# Patient Record
Sex: Female | Born: 1960 | Race: Black or African American | Hispanic: No | Marital: Single | Smoking: Never smoker
Health system: Southern US, Community
[De-identification: ages and names within clinical notes are randomized; demographics above are authoritative.]

## PROBLEM LIST (undated history)

## (undated) DIAGNOSIS — I10 Essential (primary) hypertension: Secondary | ICD-10-CM

## (undated) DIAGNOSIS — Z8041 Family history of malignant neoplasm of ovary: Secondary | ICD-10-CM

## (undated) DIAGNOSIS — F419 Anxiety disorder, unspecified: Secondary | ICD-10-CM

## (undated) DIAGNOSIS — I517 Cardiomegaly: Secondary | ICD-10-CM

## (undated) DIAGNOSIS — Z803 Family history of malignant neoplasm of breast: Secondary | ICD-10-CM

## (undated) DIAGNOSIS — E079 Disorder of thyroid, unspecified: Secondary | ICD-10-CM

## (undated) DIAGNOSIS — R42 Dizziness and giddiness: Secondary | ICD-10-CM

## (undated) DIAGNOSIS — D649 Anemia, unspecified: Secondary | ICD-10-CM

## (undated) DIAGNOSIS — G4730 Sleep apnea, unspecified: Secondary | ICD-10-CM

## (undated) DIAGNOSIS — I272 Pulmonary hypertension, unspecified: Secondary | ICD-10-CM

## (undated) DIAGNOSIS — M1990 Unspecified osteoarthritis, unspecified site: Secondary | ICD-10-CM

## (undated) DIAGNOSIS — E0590 Thyrotoxicosis, unspecified without thyrotoxic crisis or storm: Secondary | ICD-10-CM

## (undated) HISTORY — DX: Family history of malignant neoplasm of breast: Z80.3

## (undated) HISTORY — DX: Anxiety disorder, unspecified: F41.9

## (undated) HISTORY — DX: Pulmonary hypertension, unspecified: I27.20

## (undated) HISTORY — DX: Dizziness and giddiness: R42

## (undated) HISTORY — DX: Family history of malignant neoplasm of ovary: Z80.41

## (undated) HISTORY — DX: Sleep apnea, unspecified: G47.30

## (undated) HISTORY — DX: Disorder of thyroid, unspecified: E07.9

## (undated) HISTORY — PX: TUBAL LIGATION: SHX77

## (undated) HISTORY — DX: Unspecified osteoarthritis, unspecified site: M19.90

---

## 1999-10-29 ENCOUNTER — Encounter: Payer: Self-pay | Admitting: Obstetrics and Gynecology

## 1999-11-07 ENCOUNTER — Encounter: Payer: Self-pay | Admitting: Obstetrics and Gynecology

## 1999-11-07 ENCOUNTER — Ambulatory Visit (HOSPITAL_COMMUNITY): Admission: RE | Admit: 1999-11-07 | Discharge: 1999-11-07 | Payer: Self-pay | Admitting: Obstetrics and Gynecology

## 1999-12-10 ENCOUNTER — Encounter (INDEPENDENT_AMBULATORY_CARE_PROVIDER_SITE_OTHER): Payer: Self-pay | Admitting: Specialist

## 1999-12-10 ENCOUNTER — Other Ambulatory Visit: Admission: RE | Admit: 1999-12-10 | Discharge: 1999-12-10 | Payer: Self-pay | Admitting: Obstetrics and Gynecology

## 1999-12-24 ENCOUNTER — Observation Stay (HOSPITAL_COMMUNITY): Admission: RE | Admit: 1999-12-24 | Discharge: 1999-12-25 | Payer: Self-pay | Admitting: Obstetrics and Gynecology

## 1999-12-24 ENCOUNTER — Encounter: Payer: Self-pay | Admitting: Obstetrics and Gynecology

## 2000-10-06 ENCOUNTER — Other Ambulatory Visit: Admission: RE | Admit: 2000-10-06 | Discharge: 2000-10-06 | Payer: Self-pay | Admitting: Obstetrics and Gynecology

## 2000-10-11 ENCOUNTER — Ambulatory Visit (HOSPITAL_COMMUNITY): Admission: RE | Admit: 2000-10-11 | Discharge: 2000-10-11 | Payer: Self-pay | Admitting: Obstetrics and Gynecology

## 2000-10-11 ENCOUNTER — Encounter: Payer: Self-pay | Admitting: Obstetrics and Gynecology

## 2006-03-31 ENCOUNTER — Ambulatory Visit: Payer: Self-pay

## 2007-04-06 ENCOUNTER — Ambulatory Visit: Payer: Self-pay | Admitting: Internal Medicine

## 2007-04-28 ENCOUNTER — Ambulatory Visit: Payer: Self-pay | Admitting: Internal Medicine

## 2007-07-31 ENCOUNTER — Emergency Department (HOSPITAL_COMMUNITY): Admission: EM | Admit: 2007-07-31 | Discharge: 2007-07-31 | Payer: Self-pay | Admitting: Emergency Medicine

## 2007-12-09 ENCOUNTER — Ambulatory Visit: Payer: Self-pay | Admitting: Gastroenterology

## 2008-07-07 ENCOUNTER — Ambulatory Visit: Payer: Self-pay | Admitting: Internal Medicine

## 2008-07-16 ENCOUNTER — Ambulatory Visit: Payer: Self-pay | Admitting: Internal Medicine

## 2008-08-07 ENCOUNTER — Ambulatory Visit: Payer: Self-pay | Admitting: Internal Medicine

## 2008-09-06 ENCOUNTER — Ambulatory Visit: Payer: Self-pay | Admitting: Internal Medicine

## 2009-07-07 ENCOUNTER — Ambulatory Visit: Payer: Self-pay | Admitting: Internal Medicine

## 2009-08-02 ENCOUNTER — Ambulatory Visit: Payer: Self-pay | Admitting: Internal Medicine

## 2009-08-07 ENCOUNTER — Ambulatory Visit: Payer: Self-pay | Admitting: Internal Medicine

## 2009-12-06 ENCOUNTER — Ambulatory Visit: Payer: Self-pay | Admitting: Internal Medicine

## 2009-12-07 ENCOUNTER — Ambulatory Visit: Payer: Self-pay | Admitting: Internal Medicine

## 2010-01-08 ENCOUNTER — Ambulatory Visit: Payer: Self-pay | Admitting: Internal Medicine

## 2010-12-03 LAB — URINALYSIS, ROUTINE W REFLEX MICROSCOPIC
Nitrite: NEGATIVE
Protein, ur: NEGATIVE
Specific Gravity, Urine: 1.025
Urobilinogen, UA: 0.2

## 2011-02-02 ENCOUNTER — Ambulatory Visit: Payer: Self-pay

## 2012-04-29 ENCOUNTER — Emergency Department: Payer: Self-pay | Admitting: Emergency Medicine

## 2012-04-29 LAB — CBC
HGB: 13.4 g/dL (ref 12.0–16.0)
MCH: 29 pg (ref 26.0–34.0)
MCHC: 32.7 g/dL (ref 32.0–36.0)
RDW: 14.2 % (ref 11.5–14.5)
WBC: 11.2 10*3/uL — ABNORMAL HIGH (ref 3.6–11.0)

## 2012-04-29 LAB — CK TOTAL AND CKMB (NOT AT ARMC): CK, Total: 113 U/L (ref 21–215)

## 2012-04-29 LAB — BASIC METABOLIC PANEL
BUN: 11 mg/dL (ref 7–18)
Co2: 30 mmol/L (ref 21–32)
EGFR (Non-African Amer.): >60
Glucose: 94 mg/dL (ref 65–99)
Osmolality: 280 (ref 275–301)

## 2012-04-30 ENCOUNTER — Emergency Department (HOSPITAL_COMMUNITY): Payer: Managed Care, Other (non HMO)

## 2012-04-30 ENCOUNTER — Emergency Department (HOSPITAL_COMMUNITY)
Admission: EM | Admit: 2012-04-30 | Discharge: 2012-04-30 | Disposition: A | Discharge status: Home / Self Care | Payer: Managed Care, Other (non HMO) | Attending: Emergency Medicine | Admitting: Emergency Medicine

## 2012-04-30 ENCOUNTER — Encounter (HOSPITAL_COMMUNITY): Payer: Self-pay

## 2012-04-30 DIAGNOSIS — Z79899 Other long term (current) drug therapy: Secondary | ICD-10-CM | POA: Insufficient documentation

## 2012-04-30 DIAGNOSIS — R071 Chest pain on breathing: Secondary | ICD-10-CM | POA: Insufficient documentation

## 2012-04-30 DIAGNOSIS — R0789 Other chest pain: Secondary | ICD-10-CM

## 2012-04-30 DIAGNOSIS — D649 Anemia, unspecified: Secondary | ICD-10-CM | POA: Insufficient documentation

## 2012-04-30 DIAGNOSIS — I10 Essential (primary) hypertension: Secondary | ICD-10-CM | POA: Insufficient documentation

## 2012-04-30 HISTORY — DX: Anemia, unspecified: D64.9

## 2012-04-30 HISTORY — DX: Essential (primary) hypertension: I10

## 2012-04-30 LAB — CBC WITH DIFFERENTIAL/PLATELET
Eosinophils Absolute: 0.2 10*3/uL (ref 0.0–0.7)
Eosinophils Relative: 3 % (ref 0–5)
Hemoglobin: 13.6 g/dL (ref 12.0–15.0)
Lymphs Abs: 2.8 10*3/uL (ref 0.7–4.0)
MCH: 28.6 pg (ref 26.0–34.0)
MCV: 86.3 fL (ref 78.0–100.0)
Monocytes Absolute: 0.6 10*3/uL (ref 0.1–1.0)
Monocytes Relative: 8 % (ref 3–12)
RBC: 4.75 MIL/uL (ref 3.87–5.11)

## 2012-04-30 LAB — BASIC METABOLIC PANEL
BUN: 10 mg/dL (ref 6–23)
Calcium: 8.8 mg/dL (ref 8.4–10.5)
GFR calc non Af Amer: >90 mL/min (ref >90)
Glucose, Bld: 113 mg/dL — ABNORMAL HIGH (ref 70–99)
Potassium: 3.6 mEq/L (ref 3.5–5.1)

## 2012-04-30 MED ORDER — KETOROLAC TROMETHAMINE 30 MG/ML IJ SOLN
30.0000 mg | Freq: Once | INTRAMUSCULAR | Status: AC
  Administered 2012-04-30: 30 mg via INTRAVENOUS
  Filled 2012-04-30: qty 1

## 2012-04-30 MED ORDER — METHOCARBAMOL 500 MG PO TABS: 1000.0000 mg | ORAL_TABLET | Freq: Four times a day (QID) | ORAL | Status: DC | PRN

## 2012-04-30 MED ORDER — NAPROXEN 250 MG PO TABS: 250.0000 mg | ORAL_TABLET | Freq: Two times a day (BID) | ORAL | Status: DC

## 2012-04-30 MED ORDER — HYDROCODONE-ACETAMINOPHEN 5-325 MG PO TABS: ORAL_TABLET | ORAL | Status: DC

## 2012-04-30 NOTE — ED Provider Notes (Signed)
History     CSN: [id number]  Arrival date & time 04/30/12  1056   First MD Initiated Contact with Patient 04/30/12 1110      Chief Complaint  Patient presents with  . Chest Pain     HPI Pt was seen at 1120.   Per pt, c/o gradual onset and persistence of constant left sided chest "pain" for the past 3 days.  Describes the CP as "sharp" and "constant."  Pain worsens with palpation of the area and movement of her torso.  States she was eval at an [facility] yesterday, and sent to [facility] ED for further eval.  States "they took some blood work every 4 hours" but did not see a doctor, so they left AMA after 8 hours.  Denies any change in pain, no SOB/cough, no palpitations, no injury, no abd pain, no N/V/D, no back pain, no fevers, no rash.     Past Medical History  Diagnosis Date  . Hypertension   . Anemia     Past Surgical History  Procedure Laterality Date  . Tubal ligation      History  Substance Use Topics  . Smoking status: Never Smoker   . Smokeless tobacco: Not on file  . Alcohol Use: No    Review of Systems ROS: Statement: All systems negative except as marked or noted in the HPI; Constitutional: Negative for fever and chills. ; ; Eyes: Negative for eye pain, redness and discharge. ; ; ENMT: Negative for ear pain, hoarseness, nasal congestion, sinus pressure and sore throat. ; ; Cardiovascular: +CP. Negative for palpitations, diaphoresis, dyspnea and peripheral edema. ; ; Respiratory: Negative for cough, wheezing and stridor. ; ; Gastrointestinal: Negative for nausea, vomiting, diarrhea, abdominal pain, blood in stool, hematemesis, jaundice and rectal bleeding. . ; ; Genitourinary: Negative for dysuria, flank pain and hematuria. ; ; Musculoskeletal: Negative for back pain and neck pain. Negative for swelling and trauma.; ; Skin: Negative for pruritus, rash, abrasions, blisters, bruising and skin lesion.; ; Neuro: Negative for headache, lightheadedness and neck stiffness. Negative  for weakness, altered level of consciousness , altered mental status, extremity weakness, paresthesias, involuntary movement, seizure and syncope.       Allergies  Review of patient's allergies indicates no known allergies.  Home Medications   Current Outpatient Rx  Name  Route  Sig  Dispense  Refill  . acetaminophen (TYLENOL) 500 MG tablet   Oral   Take 1,000 mg by mouth every 6 (six) hours as needed for pain.         . ferrous sulfate 325 (65 FE) MG tablet   Oral   Take 325 mg by mouth daily.         Marland Kitchen ibuprofen (ADVIL,MOTRIN) 200 MG tablet   Oral   Take 400 mg by mouth every 6 (six) hours as needed for pain.         Marland Kitchen levonorgestrel (MIRENA) 20 MCG/24HR IUD   Intrauterine   1 each by Intrauterine route once.           BP 141/81  Pulse 63  Temp(Src) 98.2 F (36.8 C) (Oral)  Resp 19  Ht 5\' 7"  (1.702 m)  Wt 268 lb (121.564 kg)  BMI 41.96 kg/m2  SpO2 98%  LMP 04/30/2012  Physical Exam 1125: Physical examination:  Nursing notes reviewed; Vital signs and O2 SAT reviewed;  Constitutional: Well developed, Well nourished, Well hydrated, In no acute distress; Head:  Normocephalic, atraumatic; Eyes: EOMI, PERRL, No scleral icterus;  ENMT: Mouth and pharynx normal, Mucous membranes moist; Neck: Supple, Full range of motion, No lymphadenopathy; Cardiovascular: Regular rate and rhythm, No murmur, rub, or gallop; Respiratory: Breath sounds clear & equal bilaterally, No rales, rhonchi, wheezes.  Speaking full sentences with ease, Normal respiratory effort/excursion; Chest: +left parasternal and anterior chest wall tenderness to palp. No soft tissue crepitus, Movement normal; Abdomen: Soft, Nontender, Nondistended, Normal bowel sounds;; Extremities: Pulses normal, No tenderness, No edema, No calf edema or asymmetry.; Neuro: AA&Ox3, Major CN grossly intact.  Speech clear. No gross focal motor or sensory deficits in extremities.; Skin: Color normal, Warm, Dry.   ED Course   Procedures    MDM  MDM Reviewed: nursing note, previous chart and vitals Reviewed previous: labs, ECG and x-ray Interpretation: labs, ECG and x-ray    Date: 04/30/2012  Rate: 64  Rhythm: normal sinus rhythm  QRS Axis: left  Intervals: normal  ST/T Wave abnormalities: normal  Conduction Disutrbances:none  Narrative Interpretation: poor R-wave progression  Old EKG Reviewed: none available    Results for orders placed during the hospital encounter of 04/30/12  CBC WITH DIFFERENTIAL      Result Value Range   WBC 7.9  4.0 - 10.5 K/uL   RBC 4.75  3.87 - 5.11 MIL/uL   Hemoglobin 13.6  12.0 - 15.0 g/dL   HCT 21.3  08.6 - 57.8 %   MCV 86.3  78.0 - 100.0 fL   MCH 28.6  26.0 - 34.0 pg   MCHC 33.2  30.0 - 36.0 g/dL   RDW 46.9  62.9 - 52.8 %   Platelets 344  150 - 400 K/uL   Neutrophils Relative 53  43 - 77 %   Neutro Abs 4.2  1.7 - 7.7 K/uL   Lymphocytes Relative 36  12 - 46 %   Lymphs Abs 2.8  0.7 - 4.0 K/uL   Monocytes Relative 8  3 - 12 %   Monocytes Absolute 0.6  0.1 - 1.0 K/uL   Eosinophils Relative 3  0 - 5 %   Eosinophils Absolute 0.2  0.0 - 0.7 K/uL   Basophils Relative 1  0 - 1 %   Basophils Absolute 0.1  0.0 - 0.1 K/uL  BASIC METABOLIC PANEL      Result Value Range   Sodium 139  135 - 145 mEq/L   Potassium 3.6  3.5 - 5.1 mEq/L   Chloride 106  96 - 112 mEq/L   CO2 25  19 - 32 mEq/L   Glucose, Bld 113 (*) 70 - 99 mg/dL   BUN 10  6 - 23 mg/dL   Creatinine, Ser 4.13  0.50 - 1.10 mg/dL   Calcium 8.8  8.4 - 24.4 mg/dL   GFR calc non Af Amer >90  >90 mL/min   GFR calc Af Amer >90  >90 mL/min  TROPONIN I      Result Value Range   Troponin I <0.30  <0.30 ng/mL   Dg Chest 2 View 04/30/2012  *RADIOLOGY REPORT*  Clinical Data: Chest pain  CHEST - 2 VIEW  Comparison: None.  Findings: Borderline cardiomegaly.  No acute infiltrate or pleural effusion.  No pulmonary edema.  Mild left basilar atelectasis.  IMPRESSION: No acute infiltrate or pulmonary edema.  Borderline  cardiomegaly. Mild left basilar atelectasis.   Original Report Authenticated By: Natasha Mead, M.D.      1420:  Pt states she feels better now and wants to go home.  Has tol PO well while in  the ED.  [place] ED records received:  Pt LWBS after 2 normal troponin levels 4 hours apart and EKG without acute STTW changes.  Today's EKG without acute STTW changes and troponin without elevation.  Doubt ACS with 3 days of constant CP, normal troponins x3 and EKG's x2 without acute STTW changes over the past 2 days. Doubt PE with low risk [place]. Will tx symptomatically at this time. Dx and testing d/w pt and family.  Questions answered.  Verb understanding, agreeable to d/c home with outpt f/u.        Laray Anger, DO 05/02/12 [facility] [place]

## 2012-04-30 NOTE — ED Notes (Signed)
Patient with no complaints at this time. Respirations even and unlabored. Skin warm/dry. Discharge instructions reviewed with patient at this time. Patient given opportunity to voice concerns/ask questions. IV removed per policy and band-aid applied to site. Patient discharged at this time and left Emergency Department with steady gait.  

## 2012-04-30 NOTE — ED Notes (Signed)
Pt c/o left sided chest pain since Wedesday.  Says when she moves a certain way the pain radiates into left shoulder blade.  Denies any SOB.  Pt went to Urgent care yesterday and had chest x ray.  PT went to [place] from Urgent care.  Pt said had some blood work done but never saw a doctor.  Reports waited in the waiting room for 8 hours.

## 2012-08-11 ENCOUNTER — Emergency Department: Payer: Self-pay | Admitting: Emergency Medicine

## 2013-07-18 ENCOUNTER — Emergency Department: Payer: Self-pay | Admitting: Emergency Medicine

## 2013-10-11 ENCOUNTER — Ambulatory Visit: Payer: Self-pay

## 2013-10-15 ENCOUNTER — Emergency Department (HOSPITAL_COMMUNITY)
Admission: EM | Admit: 2013-10-15 | Discharge: 2013-10-15 | Disposition: A | Discharge status: Home / Self Care | Payer: 59 | Attending: Emergency Medicine | Admitting: Emergency Medicine

## 2013-10-15 ENCOUNTER — Emergency Department (HOSPITAL_COMMUNITY): Payer: 59

## 2013-10-15 ENCOUNTER — Encounter (HOSPITAL_COMMUNITY): Payer: Self-pay | Admitting: Emergency Medicine

## 2013-10-15 DIAGNOSIS — Z79899 Other long term (current) drug therapy: Secondary | ICD-10-CM | POA: Diagnosis not present

## 2013-10-15 DIAGNOSIS — I10 Essential (primary) hypertension: Secondary | ICD-10-CM | POA: Insufficient documentation

## 2013-10-15 DIAGNOSIS — E669 Obesity, unspecified: Secondary | ICD-10-CM | POA: Diagnosis not present

## 2013-10-15 DIAGNOSIS — M25562 Pain in left knee: Secondary | ICD-10-CM

## 2013-10-15 DIAGNOSIS — Z791 Long term (current) use of non-steroidal anti-inflammatories (NSAID): Secondary | ICD-10-CM | POA: Insufficient documentation

## 2013-10-15 DIAGNOSIS — Z862 Personal history of diseases of the blood and blood-forming organs and certain disorders involving the immune mechanism: Secondary | ICD-10-CM | POA: Diagnosis not present

## 2013-10-15 DIAGNOSIS — M25569 Pain in unspecified knee: Secondary | ICD-10-CM | POA: Diagnosis not present

## 2013-10-15 MED ORDER — HYDROCODONE-ACETAMINOPHEN 5-325 MG PO TABS
1.0000 | ORAL_TABLET | Freq: Once | ORAL | Status: AC
  Administered 2013-10-15: 1 via ORAL
  Filled 2013-10-15 (×2): qty 1

## 2013-10-15 MED ORDER — NAPROXEN 250 MG PO TABS
500.0000 mg | ORAL_TABLET | Freq: Once | ORAL | Status: AC
  Administered 2013-10-15: 500 mg via ORAL
  Filled 2013-10-15: qty 2

## 2013-10-15 MED ORDER — HYDROCODONE-ACETAMINOPHEN 5-325 MG PO TABS: ORAL_TABLET | ORAL | Status: DC

## 2013-10-15 MED ORDER — MELOXICAM 15 MG PO TABS: 15.0000 mg | ORAL_TABLET | Freq: Every day | ORAL | Status: DC

## 2013-10-15 NOTE — ED Notes (Signed)
PT c/o left knee pain x1 week with no injury. PT ambulatory in triage and has full ROM to left knee.

## 2013-10-15 NOTE — Discharge Instructions (Signed)
Arthritis, Nonspecific °Arthritis is pain, redness, warmth, or puffiness (inflammation) of a joint. The joint may be stiff or hurt when you move it. One or more joints may be affected. There are many types of arthritis. Your doctor may not know what type you have right away. The most common cause of arthritis is wear and tear on the joint (osteoarthritis). °HOME CARE  °· Only take medicine as told by your doctor. °· Rest the joint as much as possible. °· Raise (elevate) your joint if it is puffy. °· Use crutches if the painful joint is in your leg. °· Drink enough fluids to keep your pee (urine) clear or pale yellow. °· Follow your doctor's diet instructions. °· Use cold packs for very bad joint pain for 10 to 15 minutes every hour. Ask your doctor if it is okay for you to use hot packs. °· Exercise as told by your doctor. °· Take a warm shower if you have stiffness in the morning. °· Move your sore joints throughout the day. °GET HELP RIGHT AWAY IF:  °· You have a fever. °· You have very bad joint pain, puffiness, or redness. °· You have many joints that are painful and puffy. °· You are not getting better with treatment. °· You have very bad back pain or leg weakness. °· You cannot control when you poop (bowel movement) or pee (urinate). °· You do not feel better in 24 hours or are getting worse. °· You are having side effects from your medicine. °MAKE SURE YOU:  °· Understand these instructions. °· Will watch your condition. °· Will get help right away if you are not doing well or get worse. °Document Released: 05/20/2009 Document Revised: 08/25/2011 Document Reviewed: 05/20/2009 °ExitCare® Patient Information ©2015 [employer]. This information is not intended to replace advice given to you by your health care provider. Make sure you discuss any questions you have with your health care provider. ° °Knee Pain °The knee is the complex joint between your thigh and your lower leg. It is made up of bones, tendons,  ligaments, and cartilage. The bones that make up the knee are: °· The femur in the thigh. °· The tibia and fibula in the lower leg. °· The patella or kneecap riding in the groove on the lower femur. °CAUSES  °Knee pain is a common complaint with many causes. A few of these causes are: °· Injury, such as: °¨ A ruptured ligament or tendon injury. °¨ Torn cartilage. °· Medical conditions, such as: °¨ Gout °¨ Arthritis °¨ Infections °· Overuse, over training, or overdoing a physical activity. °Knee pain can be minor or severe. Knee pain can accompany debilitating injury. Minor knee problems often respond well to self-care measures or get well on their own. More serious injuries may need medical intervention or even surgery. °SYMPTOMS °The knee is complex. Symptoms of knee problems can vary widely. Some of the problems are: °· Pain with movement and weight bearing. °· Swelling and tenderness. °· Buckling of the knee. °· Inability to straighten or extend your knee. °· Your knee locks and you cannot straighten it. °· Warmth and redness with pain and fever. °· Deformity or dislocation of the kneecap. °DIAGNOSIS  °Determining what is wrong may be very straight forward such as when there is an injury. It can also be challenging because of the complexity of the knee. Tests to make a diagnosis may include: °· Your caregiver taking a history and doing a physical exam. °· Routine X-rays   can be used to rule out other problems. X-rays will not reveal a cartilage tear. Some injuries of the knee can be diagnosed by: °¨ Arthroscopy a surgical technique by which a small video camera is inserted through tiny incisions on the sides of the knee. This procedure is used to examine and repair internal knee joint problems. Tiny instruments can be used during arthroscopy to repair the torn knee cartilage (meniscus). °¨ Arthrography is a radiology technique. A contrast liquid is directly injected into the knee joint. Internal structures of the  knee joint then become visible on X-ray film. °¨ An MRI scan is a non X-ray radiology procedure in which magnetic fields and a computer produce two- or three-dimensional images of the inside of the knee. Cartilage tears are often visible using an MRI scanner. MRI scans have largely replaced arthrography in diagnosing cartilage tears of the knee. °· Blood work. °· Examination of the fluid that helps to lubricate the knee joint (synovial fluid). This is done by taking a sample out using a needle and a syringe. °TREATMENT °The treatment of knee problems depends on the cause. Some of these treatments are: °· Depending on the injury, proper casting, splinting, surgery, or physical therapy care will be needed. °· Give yourself adequate recovery time. Do not overuse your joints. If you begin to get sore during workout routines, back off. Slow down or do fewer repetitions. °· For repetitive activities such as cycling or running, maintain your strength and nutrition. °· Alternate muscle groups. For example, if you are a weight lifter, work the upper body on one day and the lower body the next. °· Either tight or weak muscles do not give the proper support for your knee. Tight or weak muscles do not absorb the stress placed on the knee joint. Keep the muscles surrounding the knee strong. °· Take care of mechanical problems. °¨ If you have flat feet, orthotics or special shoes may help. See your caregiver if you need help. °¨ Arch supports, sometimes with wedges on the inner or outer aspect of the heel, can help. These can shift pressure away from the side of the knee most bothered by osteoarthritis. °¨ A brace called an "unloader" brace also may be used to help ease the pressure on the most arthritic side of the knee. °· If your caregiver has prescribed crutches, braces, wraps or ice, use as directed. The acronym for this is PRICE. This means protection, rest, ice, compression, and elevation. °· Nonsteroidal anti-inflammatory  drugs (NSAIDs), can help relieve pain. But if taken immediately after an injury, they may actually increase swelling. Take NSAIDs with food in your stomach. Stop them if you develop stomach problems. Do not take these if you have a history of ulcers, stomach pain, or bleeding from the bowel. Do not take without your caregiver's approval if you have problems with fluid retention, heart failure, or kidney problems. °· For ongoing knee problems, physical therapy may be helpful. °· Glucosamine and chondroitin are over-the-counter dietary supplements. Both may help relieve the pain of osteoarthritis in the knee. These medicines are different from the usual anti-inflammatory drugs. Glucosamine may decrease the rate of cartilage destruction. °· Injections of a corticosteroid drug into your knee joint may help reduce the symptoms of an arthritis flare-up. They may provide pain relief that lasts a few months. You may have to wait a few months between injections. The injections do have a small increased risk of infection, water retention, and elevated blood sugar levels. °·   Hyaluronic acid injected into damaged joints may ease pain and provide lubrication. These injections may work by reducing inflammation. A series of shots may give relief for as long as 6 months. °· Topical painkillers. Applying certain ointments to your skin may help relieve the pain and stiffness of osteoarthritis. Ask your pharmacist for suggestions. Many over the-counter products are approved for temporary relief of arthritis pain. °· In some countries, doctors often prescribe topical NSAIDs for relief of chronic conditions such as arthritis and tendinitis. A review of treatment with NSAID creams found that they worked as well as oral medications but without the serious side effects. °PREVENTION °· Maintain a healthy weight. Extra pounds put more strain on your joints. °· Get strong, stay limber. Weak muscles are a common cause of knee injuries.  Stretching is important. Include flexibility exercises in your workouts. °· Be smart about exercise. If you have osteoarthritis, chronic knee pain or recurring injuries, you may need to change the way you exercise. This does not mean you have to stop being active. If your knees ache after jogging or playing basketball, consider switching to swimming, water aerobics, or other low-impact activities, at least for a few days a week. Sometimes limiting high-impact activities will provide relief. °· Make sure your shoes fit well. Choose footwear that is right for your sport. °· Protect your knees. Use the proper gear for knee-sensitive activities. Use kneepads when playing volleyball or laying carpet. Buckle your seat belt every time you drive. Most shattered kneecaps occur in car accidents. °· Rest when you are tired. °SEEK MEDICAL CARE IF:  °You have knee pain that is continual and does not seem to be getting better.  °SEEK IMMEDIATE MEDICAL CARE IF:  °Your knee joint feels hot to the touch and you have a high fever. °MAKE SURE YOU:  °· Understand these instructions. °· Will watch your condition. °· Will get help right away if you are not doing well or get worse. °Document Released: 12/21/2006 Document Revised: 05/18/2011 Document Reviewed: 12/21/2006 °ExitCare® Patient Information ©2015 [employer]. This information is not intended to replace advice given to you by your health care provider. Make sure you discuss any questions you have with your health care provider. ° °

## 2013-10-15 NOTE — ED Provider Notes (Signed)
CSN: [id number]     Arrival date & time 10/15/13  1748 History  This chart was scribed for non-physician practitioner, Kem Parkinson, PA-C,working with Fredia Sorrow, MD, by [name], ED Scribe. This patient was seen in room APFT22/APFT22 and the patient's care was started at 7:09 PM.  Chief Complaint  Patient presents with  . Knee Pain   Patient is a 53 y.o. female presenting with knee pain. The history is provided by the patient. No language interpreter was used.  Knee Pain Associated symptoms: no back pain, no fatigue, no fever and no neck pain    HPI Comments:  Shelley [name] is a 53 y.o. obese female with h/o HTN who presents to the Emergency Department complaining of worsening, sharp medial left knee pain that started approximately two days ago. She states the pain worsens with extension. Bending lessens the pain. Pt has been taking Tylenol with last dose earlier this morning with no relief. Pt denies any trauma, fall or injury. She denies numbness, redness, swelling or tingling of the LLE ,left hip or ankle pain. She denies any allergies to any medication.  Past Medical History  Diagnosis Date  . Hypertension   . Anemia    Past Surgical History  Procedure Laterality Date  . Tubal ligation     No family history on file. History  Substance Use Topics  . Smoking status: Never Smoker   . Smokeless tobacco: Not on file  . Alcohol Use: No   OB History   Grav Para Term Preterm Abortions TAB SAB Ect Mult Living                 Review of Systems  Constitutional: Negative for fever, chills and fatigue.  HENT: Negative for sore throat and trouble swallowing.   Respiratory: Negative for cough, shortness of breath and wheezing.   Cardiovascular: Negative for chest pain and palpitations.  Gastrointestinal: Negative for nausea, vomiting, abdominal pain and blood in stool.  Genitourinary: Negative for dysuria, hematuria and flank pain.  Musculoskeletal: Positive  for arthralgias. Negative for back pain, myalgias, neck pain and neck stiffness.  Skin: Negative for rash.  Neurological: Negative for dizziness, weakness and numbness.  Hematological: Does not bruise/bleed easily.    Allergies  Review of patient's allergies indicates no known allergies.  Home Medications   Prior to Admission medications   Medication Sig Start Date End Date Taking? Authorizing Provider  acetaminophen (TYLENOL) 500 MG tablet Take 1,000 mg by mouth every 6 (six) hours as needed for pain.    Historical Provider, MD  ferrous sulfate 325 (65 FE) MG tablet Take 325 mg by mouth daily.    Historical Provider, MD  HYDROcodone-acetaminophen (NORCO/VICODIN) 5-325 MG per tablet 1 or 2 tabs PO q6 hours prn pain 04/30/12   Francine Graven, DO  ibuprofen (ADVIL,MOTRIN) 200 MG tablet Take 400 mg by mouth every 6 (six) hours as needed for pain.    Historical Provider, MD  levonorgestrel (MIRENA) 20 MCG/24HR IUD 1 each by Intrauterine route once.    Historical Provider, MD  methocarbamol (ROBAXIN) 500 MG tablet Take 2 tablets (1,000 mg total) by mouth 4 (four) times daily as needed (muscle spasm/pain). 04/30/12   Francine Graven, DO  naproxen (NAPROSYN) 250 MG tablet Take 1 tablet (250 mg total) by mouth 2 (two) times daily with a meal. 04/30/12   Francine Graven, DO   Triage Vitals: BP 128/87  Pulse 68  Temp(Src) 98.2 F (36.8 C) (Oral)  Resp 20  Ht 5\' 6"  (1.676 m)  Wt 269 lb (122.018 kg)  BMI 43.44 kg/m2  SpO2 98% Physical Exam  Nursing note and vitals reviewed. Constitutional: She is oriented to person, place, and time. She appears well-developed and well-nourished.  HENT:  Head: Normocephalic and atraumatic.  Eyes: Conjunctivae are normal.  Neck: Normal range of motion.  Cardiovascular: Normal rate, regular rhythm and normal heart sounds.  Exam reveals no gallop and no friction rub.   No murmur heard. Pulmonary/Chest: Effort normal and breath sounds normal. No respiratory  distress. She has no wheezes. She has no rales.  Musculoskeletal: Normal range of motion. She exhibits tenderness. She exhibits no edema.  Localized tenderness to medial left knee. Mild to moderate patella crepitus. No effusion. Full ROM of knee joint. DP pulse brisk, distal sensation intact.  No calf pain or edema.    Neurological: She is alert and oriented to person, place, and time.  Skin: Skin is warm and dry.  Psychiatric: She has a normal mood and affect. Her behavior is normal.    ED Course  Procedures (including critical care time) DIAGNOSTIC STUDIES: Oxygen Saturation is 98% on RA, normal by my interpretation.   COORDINATION OF CARE: 7:14 PM- Advised pt to alternate heat and ice compress. Advised pt to wear supportive shoes. Will prescribe pain medication and provide brace. Will refer to orthopedics. Pt verbalizes understanding and agrees to plan.  Medications - No data to display  Labs Review Labs Reviewed - No data to display  Imaging Review Dg Knee Complete 4 Views Left  10/15/2013   CLINICAL DATA:  Left knee pain.  No known injury.  EXAM: LEFT KNEE - COMPLETE 4+ VIEW  COMPARISON:  None.  FINDINGS: Mild tricompartmental degenerative changes most notable in the medial compartment. There is joint space narrowing and early osteophytic spurring. There is also peaking of the tibial spines. No acute fracture or osteochondral abnormality. No definite joint effusion or chondrocalcinosis.  IMPRESSION: Degenerative changes but no acute bony findings or joint effusion.   Electronically Signed   By: Kalman Jewels M.D.   On: 10/15/2013 18:43     EKG Interpretation None      MDM   Final diagnoses:  Knee pain, acute, left    Pt well appearing.  clinical suspicion for septic joint is low.  Pt ambulates with steady gait.  Pain improved after application of ACE.  Referral given for Dr. Aline Brochure.    I personally performed the services described in this documentation, which was  scribed in my presence. The recorded information has been reviewed and is accurate.    Kanan Sobek L. Vanessa [place], PA-C 10/16/13 1848

## 2013-10-19 NOTE — ED Provider Notes (Signed)
Medical screening examination/treatment/procedure(s) were performed by non-physician practitioner and as supervising physician I was immediately available for consultation/collaboration.   EKG Interpretation None        Fredia Sorrow, MD 10/19/13 [id number]

## 2013-11-09 ENCOUNTER — Encounter: Payer: Self-pay | Admitting: Orthopedic Surgery

## 2013-11-09 ENCOUNTER — Ambulatory Visit: Payer: 59 | Admitting: Orthopedic Surgery

## 2014-02-04 ENCOUNTER — Encounter (HOSPITAL_COMMUNITY): Payer: Self-pay | Admitting: Emergency Medicine

## 2014-02-04 ENCOUNTER — Emergency Department (HOSPITAL_COMMUNITY)
Admission: EM | Admit: 2014-02-04 | Discharge: 2014-02-04 | Disposition: A | Discharge status: Home / Self Care | Payer: 59 | Attending: Emergency Medicine | Admitting: Emergency Medicine

## 2014-02-04 ENCOUNTER — Emergency Department (HOSPITAL_COMMUNITY): Payer: 59

## 2014-02-04 DIAGNOSIS — M1990 Unspecified osteoarthritis, unspecified site: Secondary | ICD-10-CM | POA: Insufficient documentation

## 2014-02-04 DIAGNOSIS — Z791 Long term (current) use of non-steroidal anti-inflammatories (NSAID): Secondary | ICD-10-CM | POA: Insufficient documentation

## 2014-02-04 DIAGNOSIS — M25569 Pain in unspecified knee: Secondary | ICD-10-CM

## 2014-02-04 DIAGNOSIS — M25561 Pain in right knee: Secondary | ICD-10-CM | POA: Insufficient documentation

## 2014-02-04 DIAGNOSIS — I10 Essential (primary) hypertension: Secondary | ICD-10-CM | POA: Diagnosis not present

## 2014-02-04 DIAGNOSIS — D649 Anemia, unspecified: Secondary | ICD-10-CM | POA: Diagnosis not present

## 2014-02-04 DIAGNOSIS — Z79899 Other long term (current) drug therapy: Secondary | ICD-10-CM | POA: Diagnosis not present

## 2014-02-04 MED ORDER — HYDROCODONE-ACETAMINOPHEN 5-325 MG PO TABS
1.0000 | ORAL_TABLET | Freq: Once | ORAL | Status: AC
  Administered 2014-02-04: 1 via ORAL
  Filled 2014-02-04: qty 1

## 2014-02-04 MED ORDER — NAPROXEN 500 MG PO TABS: 500.0000 mg | ORAL_TABLET | Freq: Two times a day (BID) | ORAL | Status: DC

## 2014-02-04 MED ORDER — HYDROCODONE-ACETAMINOPHEN 5-325 MG PO TABS: ORAL_TABLET | ORAL | Status: DC

## 2014-02-04 MED ORDER — NAPROXEN 250 MG PO TABS
500.0000 mg | ORAL_TABLET | Freq: Once | ORAL | Status: AC
  Administered 2014-02-04: 500 mg via ORAL
  Filled 2014-02-04: qty 2

## 2014-02-04 NOTE — ED Notes (Signed)
Patient with no complaints at this time. Respirations even and unlabored. Skin warm/dry. Discharge instructions reviewed with patient at this time. Patient given opportunity to voice concerns/ask questions. Patient discharged at this time and left Emergency Department with steady gait.   

## 2014-02-04 NOTE — ED Provider Notes (Signed)
CSN: [id number]     Arrival date & time 02/04/14  1531 History  This chart was scribed for non-physician practitioner, Kem Parkinson, PA-C,working with [place], DO, by [name], ED Scribe. This patient was seen in room APFT23/APFT23 and the patient's care was started at 4:55 PM.  Chief Complaint  Patient presents with  . Knee Pain   Patient is a 53 y.o. female presenting with knee pain. The history is provided by the patient. No language interpreter was used.  Knee Pain Associated symptoms: no back pain, no fatigue, no fever and no neck pain     HPI Comments:  Shelley [name] is a 53 y.o. obese female who presents to the Emergency Department complaining of severe, sudden onset medial right knee pain she describes as burning and throbbing that began two days ago. Pt reports taking Tylenol Arthritis with no significant relief of the pain. Bearing weight and bending the knee make the pain worse. She reports having a cortisone injection in the same knee in the past. Denies alleviating factors. Denies numbness, weakness or tingling of the RLE. Denies trauma, injury or fall. PMH of HTN and anemia.   Past Medical History  Diagnosis Date  . Hypertension   . Anemia    Past Surgical History  Procedure Laterality Date  . Tubal ligation     Family History  Problem Relation Age of Onset  . Cancer Other    History  Substance Use Topics  . Smoking status: Never Smoker   . Smokeless tobacco: Never Used  . Alcohol Use: No   OB History    Gravida Para Term Preterm AB TAB SAB Ectopic Multiple Living   4 4 4       4      Review of Systems  Constitutional: Negative for fever, chills and fatigue.  HENT: Negative for sore throat and trouble swallowing.   Respiratory: Negative for cough, shortness of breath and wheezing.   Cardiovascular: Negative for chest pain and palpitations.  Gastrointestinal: Negative for nausea, vomiting, abdominal pain and blood in stool.   Genitourinary: Negative for dysuria, hematuria and flank pain.  Musculoskeletal: Positive for arthralgias. Negative for myalgias, back pain, joint swelling, neck pain and neck stiffness.  Skin: Negative for rash.  Neurological: Negative for dizziness, weakness and numbness.  Hematological: Does not bruise/bleed easily.    Allergies  Review of patient's allergies indicates no known allergies.  Home Medications   Prior to Admission medications   Medication Sig Start Date End Date Taking? Authorizing Provider  acetaminophen (TYLENOL) 500 MG tablet Take 1,000 mg by mouth every 6 (six) hours as needed for pain.    Historical Provider, MD  ferrous sulfate 325 (65 FE) MG tablet Take 325 mg by mouth daily.    Historical Provider, MD  HYDROcodone-acetaminophen (NORCO/VICODIN) 5-325 MG per tablet 1 or 2 tabs PO q6 hours prn pain 04/30/12   Francine Graven, DO  HYDROcodone-acetaminophen (NORCO/VICODIN) 5-325 MG per tablet Take one-two tabs po q 4-6 hrs prn pain 10/15/13   Natallie Ravenscroft L. Morley Gaumer, PA-C  ibuprofen (ADVIL,MOTRIN) 200 MG tablet Take 400 mg by mouth every 6 (six) hours as needed for pain.    Historical Provider, MD  levonorgestrel (MIRENA) 20 MCG/24HR IUD 1 each by Intrauterine route once.    Historical Provider, MD  meloxicam (MOBIC) 15 MG tablet Take 1 tablet (15 mg total) by mouth daily. Take with food 10/15/13   Hammad Finkler L. Shabria Egley, PA-C  methocarbamol (ROBAXIN) 500 MG tablet Take  2 tablets (1,000 mg total) by mouth 4 (four) times daily as needed (muscle spasm/pain). 04/30/12   Francine Graven, DO  naproxen (NAPROSYN) 250 MG tablet Take 1 tablet (250 mg total) by mouth 2 (two) times daily with a meal. 04/30/12   Francine Graven, DO   Triage Vitals: BP 138/78 mmHg  Pulse 85  Temp(Src) 98.3 F (36.8 C) (Oral)  Resp 16  Ht 5\' 6"  (1.676 m)  Wt 251 lb (113.853 kg)  BMI 40.53 kg/m2  SpO2 97% Physical Exam  Constitutional: She is oriented to person, place, and time. She appears well-developed  and well-nourished.  Obese  HENT:  Head: Normocephalic and atraumatic.  Eyes: EOM are normal.  Neck: Normal range of motion.  Cardiovascular: Normal rate, regular rhythm and normal heart sounds.  Exam reveals no gallop and no friction rub.   No murmur heard. Pulmonary/Chest: Effort normal and breath sounds normal. No respiratory distress. She has no wheezes. She has no rales.  Musculoskeletal: Normal range of motion. She exhibits tenderness. She exhibits no edema.  Tenderness of the medial aspect of the right knee along the patella. No obvious ligament instability. No crepitus. No effusion or erythema  Neurological: She is alert and oriented to person, place, and time. She exhibits normal muscle tone. Coordination normal.  Skin: Skin is warm and dry.  Psychiatric: She has a normal mood and affect. Her behavior is normal.  Nursing note and vitals reviewed.   ED Course  Procedures (including critical care time) DIAGNOSTIC STUDIES: Oxygen Saturation is 97% on RA, normal by my interpretation.   COORDINATION OF CARE: 4:59 PM- Will prescribe pain medication, apply ace wrap and advised pt to RICE right knee. Will refer to orthopedist. Pt verbalizes understanding and agrees to plan.  Medications - No data to display  Labs Review Labs Reviewed - No data to display  Imaging Review Dg Knee Complete 4 Views Right  02/04/2014   CLINICAL DATA:  Medial right knee pain.  EXAM: RIGHT KNEE - COMPLETE 4+ VIEW  COMPARISON:  None.  FINDINGS: Osteophytes along the medial knee compartment. Negative for a fracture or dislocation. Evidence for a suprapatellar joint effusion. Mild degenerative changes in the patellofemoral compartment.  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  Joint effusion.   Electronically Signed   By: Markus Daft M.D.   On: 02/04/2014 16:37     EKG Interpretation None      MDM   Final diagnoses:  Right medial knee pain    Pt with knee  pain for one week.  No concerning sx's for septic joint.  Pt ambulates with wt bearing to the affected extremity.  Ace wrap applied for comfort.  She agrees to orthopedic f/u.    I personally performed the services described in this documentation, which was scribed in my presence. The recorded information has been reviewed and is accurate.    Glyndon Tursi L. Vanessa [place], PA-C 02/06/14 [place], DO 02/07/14 1636

## 2014-02-04 NOTE — ED Notes (Signed)
Pain to medial aspect of right knee. No injury

## 2014-02-04 NOTE — Discharge Instructions (Signed)

## 2014-02-04 NOTE — ED Notes (Signed)
Patient c/o right knee pain x1 week. Patient denies any known injury-states "It just started hurting." Patient reports taking tylenol and naproxen with no relief.

## 2014-12-31 ENCOUNTER — Encounter: Payer: Self-pay | Admitting: *Deleted

## 2014-12-31 ENCOUNTER — Emergency Department
Admission: EM | Admit: 2014-12-31 | Discharge: 2015-01-01 | Disposition: A | Discharge status: Home / Self Care | Payer: 59 | Attending: Emergency Medicine | Admitting: Emergency Medicine

## 2014-12-31 ENCOUNTER — Emergency Department: Payer: 59

## 2014-12-31 DIAGNOSIS — I10 Essential (primary) hypertension: Secondary | ICD-10-CM | POA: Diagnosis not present

## 2014-12-31 DIAGNOSIS — R51 Headache: Secondary | ICD-10-CM | POA: Diagnosis not present

## 2014-12-31 DIAGNOSIS — Z79899 Other long term (current) drug therapy: Secondary | ICD-10-CM | POA: Insufficient documentation

## 2014-12-31 DIAGNOSIS — Z791 Long term (current) use of non-steroidal anti-inflammatories (NSAID): Secondary | ICD-10-CM | POA: Insufficient documentation

## 2014-12-31 DIAGNOSIS — R42 Dizziness and giddiness: Secondary | ICD-10-CM | POA: Insufficient documentation

## 2014-12-31 DIAGNOSIS — R079 Chest pain, unspecified: Secondary | ICD-10-CM | POA: Insufficient documentation

## 2014-12-31 LAB — URINALYSIS COMPLETE WITH MICROSCOPIC (ARMC ONLY)
BILIRUBIN URINE: NEGATIVE
Bacteria, UA: NONE SEEN
Glucose, UA: NEGATIVE mg/dL
HGB URINE DIPSTICK: NEGATIVE
Ketones, ur: NEGATIVE mg/dL
LEUKOCYTES UA: NEGATIVE
Nitrite: NEGATIVE
PH: 5 (ref 5.0–8.0)
Protein, ur: NEGATIVE mg/dL
SPECIFIC GRAVITY, URINE: 1.011 (ref 1.005–1.030)

## 2014-12-31 LAB — COMPREHENSIVE METABOLIC PANEL
ALBUMIN: 4.2 g/dL (ref 3.5–5.0)
ALT: 17 U/L (ref 14–54)
AST: 18 U/L (ref 15–41)
Alkaline Phosphatase: 45 U/L (ref 38–126)
Anion gap: 3 — ABNORMAL LOW (ref 5–15)
BUN: 12 mg/dL (ref 6–20)
CO2: 30 mmol/L (ref 22–32)
CREATININE: 0.73 mg/dL (ref 0.44–1.00)
Calcium: 9.1 mg/dL (ref 8.9–10.3)
Chloride: 107 mmol/L (ref 101–111)
Glucose, Bld: 127 mg/dL — ABNORMAL HIGH (ref 65–99)
POTASSIUM: 3.9 mmol/L (ref 3.5–5.1)
SODIUM: 140 mmol/L (ref 135–145)
TOTAL PROTEIN: 7.1 g/dL (ref 6.5–8.1)
Total Bilirubin: 0.6 mg/dL (ref 0.3–1.2)

## 2014-12-31 LAB — CBC
HCT: 40.7 % (ref 35.0–47.0)
Hemoglobin: 13.5 g/dL (ref 12.0–16.0)
MCH: 29.1 pg (ref 26.0–34.0)
MCHC: 33.2 g/dL (ref 32.0–36.0)
MCV: 87.5 fL (ref 80.0–100.0)
PLATELETS: 392 10*3/uL (ref 150–440)
RBC: 4.65 MIL/uL (ref 3.80–5.20)
RDW: 14.5 % (ref 11.5–14.5)
WBC: 10.9 10*3/uL (ref 3.6–11.0)

## 2014-12-31 LAB — TROPONIN I

## 2014-12-31 MED ORDER — MECLIZINE HCL 25 MG PO TABS: 25.0000 mg | ORAL_TABLET | Freq: Three times a day (TID) | ORAL | Status: DC | PRN

## 2014-12-31 MED ORDER — MECLIZINE HCL 25 MG PO TABS
25.0000 mg | ORAL_TABLET | Freq: Once | ORAL | Status: AC
  Administered 2014-12-31: 25 mg via ORAL
  Filled 2014-12-31: qty 1

## 2014-12-31 MED ORDER — ACETAMINOPHEN 500 MG PO TABS
1000.0000 mg | ORAL_TABLET | Freq: Once | ORAL | Status: AC
  Administered 2014-12-31: 1000 mg via ORAL
  Filled 2014-12-31: qty 2

## 2014-12-31 NOTE — Discharge Instructions (Signed)
You have been seen in the emergency department for chest pain. Her workup has not shown any acute abnormalities. Please take your medication as needed for dizziness, as prescribed. Please follow-up with her primary care physician in the next 1-2 days for recheck/reevaluation. Please call the number provided for cardiology to arrange a stress test as soon as possible. Please return to the emergency department for any further chest pain, trouble breathing, or any other symptom personally concerning to yourself.   Nonspecific Chest Pain  Chest pain can be caused by many different conditions. There is always a chance that your pain could be related to something serious, such as a heart attack or a blood clot in your lungs. Chest pain can also be caused by conditions that are not life-threatening. If you have chest pain, it is very important to follow up with your health care provider. CAUSES  Chest pain can be caused by:  Heartburn.  Pneumonia or bronchitis.  Anxiety or stress.  Inflammation around your heart (pericarditis) or lung (pleuritis or pleurisy).  A blood clot in your lung.  A collapsed lung (pneumothorax). It can develop suddenly on its own (spontaneous pneumothorax) or from trauma to the chest.  Shingles infection (varicella-zoster virus).  Heart attack.  Damage to the bones, muscles, and cartilage that make up your chest wall. This can include:  Bruised bones due to injury.  Strained muscles or cartilage due to frequent or repeated coughing or overwork.  Fracture to one or more ribs.  Sore cartilage due to inflammation (costochondritis). RISK FACTORS  Risk factors for chest pain may include:  Activities that increase your risk for trauma or injury to your chest.  Respiratory infections or conditions that cause frequent coughing.  Medical conditions or overeating that can cause heartburn.  Heart disease or family history of heart disease.  Conditions or health  behaviors that increase your risk of developing a blood clot.  Having had chicken pox (varicella zoster). SIGNS AND SYMPTOMS Chest pain can feel like:  Burning or tingling on the surface of your chest or deep in your chest.  Crushing, pressure, aching, or squeezing pain.  Dull or sharp pain that is worse when you move, cough, or take a deep breath.  Pain that is also felt in your back, neck, shoulder, or arm, or pain that spreads to any of these areas. Your chest pain may come and go, or it may stay constant. DIAGNOSIS Lab tests or other studies may be needed to find the cause of your pain. Your health care provider may have you take a test called an ambulatory ECG (electrocardiogram). An ECG records your heartbeat patterns at the time the test is performed. You may also have other tests, such as:  Transthoracic echocardiogram (TTE). During echocardiography, sound waves are used to create a picture of all of the heart structures and to look at how blood flows through your heart.  Transesophageal echocardiogram (TEE).This is a more advanced imaging test that obtains images from inside your body. It allows your health care provider to see your heart in finer detail.  Cardiac monitoring. This allows your health care provider to monitor your heart rate and rhythm in real time.  Holter monitor. This is a portable device that records your heartbeat and can help to diagnose abnormal heartbeats. It allows your health care provider to track your heart activity for several days, if needed.  Stress tests. These can be done through exercise or by taking medicine that makes your heart  beat more quickly.  Blood tests.  Imaging tests. TREATMENT  Your treatment depends on what is causing your chest pain. Treatment may include:  Medicines. These may include:  Acid blockers for heartburn.  Anti-inflammatory medicine.  Pain medicine for inflammatory conditions.  Antibiotic medicine, if an  infection is present.  Medicines to dissolve blood clots.  Medicines to treat coronary artery disease.  Supportive care for conditions that do not require medicines. This may include:  Resting.  Applying heat or cold packs to injured areas.  Limiting activities until pain decreases. HOME CARE INSTRUCTIONS  If you were prescribed an antibiotic medicine, finish it all even if you start to feel better.  Avoid any activities that bring on chest pain.  Do not use any tobacco products, including cigarettes, chewing tobacco, or electronic cigarettes. If you need help quitting, ask your health care provider.  Do not drink alcohol.  Take medicines only as directed by your health care provider.  Keep all follow-up visits as directed by your health care provider. This is important. This includes any further testing if your chest pain does not go away.  If heartburn is the cause for your chest pain, you may be told to keep your head raised (elevated) while sleeping. This reduces the chance that acid will go from your stomach into your esophagus.  Make lifestyle changes as directed by your health care provider. These may include:  Getting regular exercise. Ask your health care provider to suggest some activities that are safe for you.  Eating a heart-healthy diet. A registered dietitian can help you to learn healthy eating options.  Maintaining a healthy weight.  Managing diabetes, if necessary.  Reducing stress. SEEK MEDICAL CARE IF:  Your chest pain does not go away after treatment.  You have a rash with blisters on your chest.  You have a fever. SEEK IMMEDIATE MEDICAL CARE IF:   Your chest pain is worse.  You have an increasing cough, or you cough up blood.  You have severe abdominal pain.  You have severe weakness.  You faint.  You have chills.  You have sudden, unexplained chest discomfort.  You have sudden, unexplained discomfort in your arms, back, neck, or  jaw.  You have shortness of breath at any time.  You suddenly start to sweat, or your skin gets clammy.  You feel nauseous or you vomit.  You suddenly feel light-headed or dizzy.  Your heart begins to beat quickly, or it feels like it is skipping beats. These symptoms may represent a serious problem that is an emergency. Do not wait to see if the symptoms will go away. Get medical help right away. Call your local emergency services (911 in the [place] Do not drive yourself to the hospital.   This information is not intended to replace advice given to you by your health care provider. Make sure you discuss any questions you have with your health care provider.   Document Released: 12/03/2004 Document Revised: 03/16/2014 Document Reviewed: 09/29/2013 Elsevier Interactive Patient Education [employer].

## 2014-12-31 NOTE — ED Notes (Signed)
Patient began with some dizziness yesterday and then chest pain. Went to Urgent Care today and BP was elevated and had abnormal EKG. [facility] recommended she come here for further work-up.

## 2014-12-31 NOTE — ED Provider Notes (Signed)
[facility] Emergency Department Provider Note  Time seen: 10:28 PM  I have reviewed the triage vital signs and the nursing notes.   HISTORY  Chief Complaint Dizziness and Chest Pain    HPI Shelley [name] is a 54 y.o. female with a past medical history of high blood pressure, anemia, presents the emergency department for chest discomfort. According to the patient she took her blood pressure today noted it to be 174 systolic. At that time she states she is having some intermittent chest pain (2 PM). States she went to an urgent care, and they referred her to the emergency department given her high blood pressure, intermittent chest pains and mild headache. Patient states mild headache, denies any focal weakness or numbness. Denies slurred speech or confusion. Patient denies shortness of breath. States the chest pain is somewhat worse with movement. Denies nausea or diaphoresis. Describes her chest pain is mild, intermittent mostly located in the central to left chest. Denies any personal history of cardiac disease.    Past Medical History  Diagnosis Date  . Hypertension   . Anemia     There are no active problems to display for this patient.   Past Surgical History  Procedure Laterality Date  . Tubal ligation      Current Outpatient Rx  Name  Route  Sig  Dispense  Refill  . acetaminophen (TYLENOL) 500 MG tablet   Oral   Take 1,000 mg by mouth every 6 (six) hours as needed for pain.         . ferrous sulfate 325 (65 FE) MG tablet   Oral   Take 325 mg by mouth daily.         Marland Kitchen HYDROcodone-acetaminophen (NORCO/VICODIN) 5-325 MG per tablet      Take one-two tabs po q 4-6 hrs prn pain   20 tablet   0   . ibuprofen (ADVIL,MOTRIN) 200 MG tablet   Oral   Take 400 mg by mouth every 6 (six) hours as needed for pain.         Marland Kitchen levonorgestrel (MIRENA) 20 MCG/24HR IUD   Intrauterine   1 each by Intrauterine route once.         .  naproxen (NAPROSYN) 500 MG tablet   Oral   Take 1 tablet (500 mg total) by mouth 2 (two) times daily with a meal.   20 tablet   0     Allergies Review of patient's allergies indicates no known allergies.  Family History  Problem Relation Age of Onset  . Cancer Other     Social History Social History  Substance Use Topics  . Smoking status: Never Smoker   . Smokeless tobacco: Never Used  . Alcohol Use: No    Review of Systems Constitutional: Negative for fever. Positive for intermittent dizziness. Cardiovascular: Positive for mild central to left-sided chest pain. Respiratory: Negative for shortness of breath. Gastrointestinal: Negative for abdominal pain, vomiting and diarrhea Neurological: Mild headache. Denies focal weakness or numbness. 10-point ROS otherwise negative.  ____________________________________________   PHYSICAL EXAM:  VITAL SIGNS: ED Triage Vitals  Enc Vitals Group     BP 12/31/14 2100 150/86 mmHg     Pulse Rate 12/31/14 2100 66     Resp 12/31/14 2100 20     Temp 12/31/14 2100 98 F (36.7 C)     Temp Source 12/31/14 2100 Oral     SpO2 12/31/14 2100 99 %     Weight 12/31/14 2100  283 lb (128.368 kg)     Height 12/31/14 2100 5\' 6"  (1.676 m)     Head Cir --      Peak Flow --      Pain Score 12/31/14 2101 8     Pain Loc --      Pain Edu? --      Excl. in [place]? --    Constitutional: Alert and oriented. Well appearing and in no distress. Eyes: Normal exam ENT   Head: Normocephalic and atraumatic.   Mouth/Throat: Mucous membranes are moist. Cardiovascular: Normal rate, regular rhythm. No murmur Respiratory: Normal respiratory effort without tachypnea nor retractions. Breath sounds are clear  Gastrointestinal: Soft and nontender. No distention.   Musculoskeletal: Nontender with normal range of motion in all extremities. No lower extremity tenderness or edema. Neurologic:  Normal speech and language. No gross focal neurologic deficits are  appreciated. Speech is normal. Skin:  Skin is warm, dry and intact.  Psychiatric: Mood and affect are normal. ____________________________________________    EKG  EKG reviewed and interpreted by myself shows normal sinus rhythm at 60 bpm, narrow QRS, left axis deviation, normal intervals, nonspecific but no concerning ST changes noted.  ____________________________________________    RADIOLOGY  Chest x-ray within normal limits  ____________________________________________    INITIAL IMPRESSION / ASSESSMENT AND PLAN / ED COURSE  Pertinent labs & imaging results that were available during my care of the patient were reviewed by me and considered in my medical decision making (see chart for details).  Patient with intermittent central left-sided chest pain, went to the urgent care for her high blood pressure, and was sent to the emergency department for further evaluation. Here her blood pressure is hypertensive, but not dangerously so. Labs are within normal limits. We will repeat a troponin, obtain a chest x-ray to help further evaluate. Patient's chest pain is reproducible on exam. Patient does describe dizziness as well today. We'll dose meclizine, and Tylenol for her mild headache. I discussed head CT with the patient, given her normal neurologic exam, only mild headache, we have jointly decided not to proceed with head CT at this time. We will closely monitor in the emergency department while awaiting further results.  Chest x-ray within normal limits  Urinalysis and repeat troponin pending. Patient care signed out to Dr. Dahlia Client. ____________________________________________   FINAL CLINICAL IMPRESSION(S) / ED DIAGNOSES  Chest pain Hypertension   Harvest Dark, MD 12/31/14 2324

## 2014-12-31 NOTE — ED Notes (Signed)
Pt to triage via wheelchair.  Pt reports feeling dizzy since yesterday.  Chest pain began today.  No \\sob .  Pt reports nausea.  No diaphoresis.  Pt alert. Speech clear.

## 2015-01-01 LAB — TROPONIN I

## 2015-01-01 NOTE — ED Provider Notes (Signed)
-----------------------------------------   12:37 AM on 01/01/2015 -----------------------------------------   Blood pressure 138/79, pulse 61, temperature 98 F (36.7 C), temperature source Oral, resp. rate 19, height 5\' 6"  (1.676 m), weight 283 lb (128.368 kg), last menstrual period 04/30/2012, SpO2 96 %.  Assuming care from Dr. Kerman Passey.  In short, Shelley [name] is a 54 y.o. female with a chief complaint of Dizziness and Chest Pain .  Refer to the original H&P for additional details.  The current plan of care is to follow-up the results of the urinalysis and the repeat troponin.  The patient's blood work is unremarkable. She will be discharged home to follow-up with her primary care physician.   Loney Hering, MD 01/01/15 [phone]

## 2015-05-16 ENCOUNTER — Encounter: Payer: Self-pay | Admitting: *Deleted

## 2015-05-16 DIAGNOSIS — I10 Essential (primary) hypertension: Secondary | ICD-10-CM | POA: Insufficient documentation

## 2015-05-16 DIAGNOSIS — Z79899 Other long term (current) drug therapy: Secondary | ICD-10-CM | POA: Insufficient documentation

## 2015-05-16 DIAGNOSIS — R079 Chest pain, unspecified: Secondary | ICD-10-CM | POA: Diagnosis present

## 2015-05-16 DIAGNOSIS — R0789 Other chest pain: Secondary | ICD-10-CM | POA: Diagnosis not present

## 2015-05-16 DIAGNOSIS — Z793 Long term (current) use of hormonal contraceptives: Secondary | ICD-10-CM | POA: Insufficient documentation

## 2015-05-16 DIAGNOSIS — Z791 Long term (current) use of non-steroidal anti-inflammatories (NSAID): Secondary | ICD-10-CM | POA: Diagnosis not present

## 2015-05-16 LAB — BASIC METABOLIC PANEL
ANION GAP: 1 — AB (ref 5–15)
BUN: 11 mg/dL (ref 6–20)
CALCIUM: 9.2 mg/dL (ref 8.9–10.3)
CO2: 32 mmol/L (ref 22–32)
CREATININE: 0.82 mg/dL (ref 0.44–1.00)
Chloride: 106 mmol/L (ref 101–111)
GFR calc non Af Amer: >60 mL/min (ref >60)
Glucose, Bld: 82 mg/dL (ref 65–99)
POTASSIUM: 3.8 mmol/L (ref 3.5–5.1)
Sodium: 139 mmol/L (ref 135–145)

## 2015-05-16 LAB — CBC
HCT: 40.2 % (ref 35.0–47.0)
Hemoglobin: 13.3 g/dL (ref 12.0–16.0)
MCH: 28.3 pg (ref 26.0–34.0)
MCHC: 33.1 g/dL (ref 32.0–36.0)
MCV: 85.5 fL (ref 80.0–100.0)
PLATELETS: 360 10*3/uL (ref 150–440)
RBC: 4.7 MIL/uL (ref 3.80–5.20)
RDW: 14.2 % (ref 11.5–14.5)
WBC: 11.1 10*3/uL — AB (ref 3.6–11.0)

## 2015-05-16 LAB — TROPONIN I

## 2015-05-16 NOTE — ED Notes (Signed)
Pt to triage via wheelchair.  Pt reports chest pain for 2 days.  Intermittent. Pain in center of chest.  Pt has sob.  Nonsmoker.  No cough.  Pain radiates into left shoulder.  No n/v/d.  Pt alert.  Speech clear.

## 2015-05-17 ENCOUNTER — Emergency Department
Admission: EM | Admit: 2015-05-17 | Discharge: 2015-05-17 | Disposition: A | Discharge status: Home / Self Care | Payer: 59 | Attending: Emergency Medicine | Admitting: Emergency Medicine

## 2015-05-17 ENCOUNTER — Encounter: Payer: Self-pay | Admitting: Emergency Medicine

## 2015-05-17 DIAGNOSIS — R0789 Other chest pain: Secondary | ICD-10-CM

## 2015-05-17 HISTORY — DX: Cardiomegaly: I51.7

## 2015-05-17 MED ORDER — ASPIRIN 81 MG PO CHEW
324.0000 mg | CHEWABLE_TABLET | Freq: Once | ORAL | Status: AC
  Administered 2015-05-17: 324 mg via ORAL
  Filled 2015-05-17: qty 4

## 2015-05-17 NOTE — Discharge Instructions (Signed)

## 2015-05-17 NOTE — ED Provider Notes (Signed)
[facility] Emergency Department Provider Note  ____________________________________________  Time seen: Approximately 1:54 AM  I have reviewed the triage vital signs and the nursing notes.   HISTORY  Chief Complaint Chest Pain    HPI Shelley [name] is a 55 y.o. female with a history of hypertension and obesity who presents with intermittent mild sharp central chest pain for about a week, radiating to the left shoulder over the last 24+ hours.  Never had similar symptoms.  Denies any recent strain/trauma.  Denies any accompanying symptoms including no SOB, N/V, diaphoresis.  Pain is reproducible with ROM of left arm/shoulder.  Mother had MI in her 37s.  Patient does not have diabetes, no tobacco history.     Past Medical History  Diagnosis Date  . Hypertension   . Anemia   . Cardiomegaly     There are no active problems to display for this patient.   Past Surgical History  Procedure Laterality Date  . Tubal ligation      Current Outpatient Rx  Name  Route  Sig  Dispense  Refill  . acetaminophen (TYLENOL) 500 MG tablet   Oral   Take 1,000 mg by mouth every 6 (six) hours as needed for pain.         Marland Kitchen amLODipine (NORVASC) 5 MG tablet   Oral   Take 5 mg by mouth daily.         . ferrous sulfate 325 (65 FE) MG tablet   Oral   Take 325 mg by mouth daily.         Marland Kitchen ibuprofen (ADVIL,MOTRIN) 200 MG tablet   Oral   Take 400 mg by mouth every 6 (six) hours as needed for pain.         Marland Kitchen levonorgestrel (MIRENA) 20 MCG/24HR IUD   Intrauterine   1 each by Intrauterine route once.         . meclizine (ANTIVERT) 25 MG tablet   Oral   Take 1 tablet (25 mg total) by mouth 3 (three) times daily as needed for dizziness.   20 tablet   0   . meloxicam (MOBIC) 15 MG tablet   Oral   Take 15 mg by mouth daily.         . naproxen (NAPROSYN) 500 MG tablet   Oral   Take 1 tablet (500 mg total) by mouth 2 (two) times daily with a  meal.   20 tablet   0   . HYDROcodone-acetaminophen (NORCO/VICODIN) 5-325 MG per tablet      Take one-two tabs po q 4-6 hrs prn pain   20 tablet   0   . minocycline (MINOCIN,DYNACIN) 100 MG capsule   Oral   Take 100 mg by mouth 2 (two) times daily.           Allergies Review of patient's allergies indicates no known allergies.  Family History  Problem Relation Age of Onset  . Cancer Other     Social History Social History  Substance Use Topics  . Smoking status: Never Smoker   . Smokeless tobacco: Never Used  . Alcohol Use: No    Review of Systems Constitutional: No fever/chills Eyes: No visual changes. ENT: No sore throat. Cardiovascular: Intermittent mild sharp central chest pain radiating to L shoulder Respiratory: Denies shortness of breath. Gastrointestinal: No abdominal pain.  No nausea, no vomiting.  No diarrhea.  No constipation. Genitourinary: Negative for dysuria. Musculoskeletal: Negative for back pain. Skin: Negative for  rash. Neurological: Negative for headaches, focal weakness or numbness.  10-point ROS otherwise negative.  ____________________________________________   PHYSICAL EXAM:  VITAL SIGNS: ED Triage Vitals  Enc Vitals Group     BP 05/16/15 2211 155/86 mmHg     Pulse Rate 05/16/15 2211 78     Resp 05/16/15 2211 20     Temp 05/16/15 2211 98.3 F (36.8 C)     Temp Source 05/16/15 2211 Oral     SpO2 05/16/15 2211 99 %     Weight 05/16/15 2211 253 lb (114.76 kg)     Height 05/16/15 2211 5\' 6"  (1.676 m)     Head Cir --      Peak Flow --      Pain Score 05/16/15 2208 9     Pain Loc --      Pain Edu? --      Excl. in [place]? --     Constitutional: Alert and oriented. Well appearing and in no acute distress. Eyes: Conjunctivae are normal. PERRL. EOMI. Head: Atraumatic. Nose: No congestion/rhinnorhea. Mouth/Throat: Mucous membranes are moist.  Oropharynx non-erythematous. Neck: No stridor.  No meningeal signs.   Cardiovascular:  Normal rate, regular rhythm. Good peripheral circulation. Grossly normal heart sounds.  Reproducible chest wall tenderness to palpation of sternum Respiratory: Normal respiratory effort.  No retractions. Lungs CTAB. Gastrointestinal: Soft and nontender. No distention.  Musculoskeletal: No lower extremity tenderness nor edema. No gross deformities of extremities.  Reproducible pain/tenderness with ROM of left upper extremity. Neurologic:  Normal speech and language. No gross focal neurologic deficits are appreciated.  Skin:  Skin is warm, dry and intact. No rash noted. Psychiatric: Mood and affect are normal. Speech and behavior are normal.  ____________________________________________   LABS (all labs ordered are listed, but only abnormal results are displayed)  Labs Reviewed  BASIC METABOLIC PANEL - Abnormal; Notable for the following:    Anion gap 1 (*)    All other components within normal limits  CBC - Abnormal; Notable for the following:    WBC 11.1 (*)    All other components within normal limits  TROPONIN I   ____________________________________________  EKG  ED ECG REPORT I, Lolah Coghlan, the attending physician, personally viewed and interpreted this ECG.  Date: 05/16/2015 EKG Time: 22:10 Rate: 73 Rhythm: normal sinus rhythm QRS Axis: normal Intervals: normal ST/T Wave abnormalities: normal Conduction Disturbances: none Narrative Interpretation: unremarkable  ____________________________________________  RADIOLOGY   No results found.  ____________________________________________   PROCEDURES  Procedure(s) performed: None  Critical Care performed: No ____________________________________________   INITIAL IMPRESSION / ASSESSMENT AND PLAN / ED COURSE  Pertinent labs & imaging results that were available during my care of the patient were reviewed by me and considered in my medical decision making (see chart for details).  HEART score 2.  Wells score  for PE = zero.  Reproducible pain with palpation and ROM of L shoulder.  Low risk chest pain needing outpatient cardiology follow up, but strongly doubt emergent medical condition at this time.  No indication for repeat troponin given duration of symptoms.  Discussed with patient and family, they understand and agree.  I gave my usual and customary return precautions.   Gave full dose ASA.   ____________________________________________  FINAL CLINICAL IMPRESSION(S) / ED DIAGNOSES  Final diagnoses:  Atypical chest pain      NEW MEDICATIONS STARTED DURING THIS VISIT:  New Prescriptions   No medications on file      Note:  This document  was prepared using Systems analyst and may include unintentional dictation errors.   Hinda Kehr, MD 05/17/15 [id number]

## 2015-11-26 ENCOUNTER — Inpatient Hospital Stay: Payer: 59 | Attending: Hematology and Oncology | Admitting: Hematology and Oncology

## 2015-11-26 ENCOUNTER — Encounter (INDEPENDENT_AMBULATORY_CARE_PROVIDER_SITE_OTHER): Payer: Self-pay

## 2015-11-26 ENCOUNTER — Encounter: Payer: Self-pay | Admitting: Hematology and Oncology

## 2015-11-26 DIAGNOSIS — D649 Anemia, unspecified: Secondary | ICD-10-CM

## 2015-11-26 DIAGNOSIS — I517 Cardiomegaly: Secondary | ICD-10-CM

## 2015-11-26 DIAGNOSIS — Z79899 Other long term (current) drug therapy: Secondary | ICD-10-CM | POA: Diagnosis not present

## 2015-11-26 DIAGNOSIS — D75839 Thrombocytosis, unspecified: Secondary | ICD-10-CM | POA: Insufficient documentation

## 2015-11-26 DIAGNOSIS — M129 Arthropathy, unspecified: Secondary | ICD-10-CM | POA: Diagnosis not present

## 2015-11-26 DIAGNOSIS — I10 Essential (primary) hypertension: Secondary | ICD-10-CM | POA: Diagnosis not present

## 2015-11-26 DIAGNOSIS — Z809 Family history of malignant neoplasm, unspecified: Secondary | ICD-10-CM | POA: Diagnosis not present

## 2015-11-26 DIAGNOSIS — D473 Essential (hemorrhagic) thrombocythemia: Secondary | ICD-10-CM | POA: Diagnosis not present

## 2015-11-26 DIAGNOSIS — R7989 Other specified abnormal findings of blood chemistry: Secondary | ICD-10-CM | POA: Insufficient documentation

## 2015-11-26 DIAGNOSIS — Z807 Family history of other malignant neoplasms of lymphoid, hematopoietic and related tissues: Secondary | ICD-10-CM

## 2015-11-26 NOTE — Progress Notes (Signed)
[facility] day:  11/26/2015  Chief Complaint: Shelley [name] is a 55 y.o. female with hemochromatosis who is referred by Dr. Humphrey Rolls for assessment and management.  HPI: She notes that she was on iron pill for years for a diagnosis of anemia. She states that she stopped her oral iron this year. She comments that "my blood levels are up".  The patient notes a diagnosis of arthritis this year. Her hands, knees, and shoulders hurt.  She received a cortisone shot as well as Flexogenics.   She denies any liver disease, diabetes, or skin color changes.  She is unsure about her last menstrual period, but believes it was after age 55.  Patient underwent colonoscopy before the age of 85.    Labs on 10/24/2014 included a hematocrit of 40.4, hemoglobin 13.9, MCV 85, platelets 434,000, white count 7400.  Labs on 07/26/2015 included a hematocrit of 40.3, hemoglobin 13.3, MCV 87, platelets 432,000, white count 9900.  Iron studies included a saturation of 32% and a TIBC of 200. B12 was 521.  Folate was > 20. TSH and free T4 were normal.  Ferritin was 390.  Labs on 10/04/2015 revealed a hematocrit of 41.3, hematoma 13.5, MCV 88, platelets 416,000, white count 8400. Iron studies included a saturation of 31% and a TIBC of 218.  B12 was 450 and folate > 20.  Rheumatoid factor was 85 (0-13.9).  ANA was negative.  Sedimentation rate was 2.  Ferritin was 286.  She denies any family history of hemochromatosis.  Her niece died at the age of 67 with lupus. Her sister has arthritis. Her mother has multiple myeloma and is in remission after autologous stem cell transplant at age 74.  Symptomatically, she notes fatigue. She falls asleep easily. She is unaware if she snores or has sleep apnea.   Past Medical History:  Diagnosis Date  . Anemia   . Arthritis   . Cardiomegaly   . Hypertension     Past Surgical History:  Procedure Laterality Date  . TUBAL LIGATION       Family History  Problem Relation Age of Onset  . Cancer Other     Social History:  reports that she has never smoked. She has never used smokeless tobacco. She reports that she does not drink alcohol or use drugs.  The patient is alone today.  Allergies: No Known Allergies  Current Medications: Current Outpatient Prescriptions  Medication Sig Dispense Refill  . acetaminophen (TYLENOL) 500 MG tablet Take 1,000 mg by mouth every 6 (six) hours as needed for pain.    Marland Kitchen amLODipine (NORVASC) 5 MG tablet Take 5 mg by mouth daily.    . meclizine (ANTIVERT) 25 MG tablet Take 1 tablet (25 mg total) by mouth 3 (three) times daily as needed for dizziness. 20 tablet 0  . meloxicam (MOBIC) 15 MG tablet Take 15 mg by mouth daily.    Marland Kitchen levonorgestrel (MIRENA) 20 MCG/24HR IUD 1 each by Intrauterine route once.     No current facility-administered medications for this visit.     Review of Systems:  GENERAL:  Feels good.  Active.  No fevers, sweats or weight loss. PERFORMANCE STATUS (ECOG): 1 HEENT:  No visual changes, runny nose, sore throat, mouth sores or tenderness. Lungs: No shortness of breath or cough.  No hemoptysis. Cardiac:  No chest pain, palpitations, orthopnea, or PND. GI:  No nausea, vomiting, diarrhea, constipation, melena or hematochezia. GU:  No urgency, frequency,  dysuria, or hematuria. Musculoskeletal:  No back pain.  No joint pain.  No muscle tenderness. Extremities:  No pain or swelling. Skin:  No rashes or skin changes. Neuro:  No headache, numbness or weakness, balance or coordination issues. Endocrine:  No diabetes, thyroid issues, hot flashes or night sweats. Psych:  No mood changes, depression or anxiety. Pain:  No focal pain. Review of systems:  All other systems reviewed and found to be negative.  Physical Exam: Blood pressure 128/76, pulse 76, temperature (!) 96.9 F (36.1 C), temperature source Tympanic, resp. rate 18, height _0  (1.676 m), weight 279 lb  8.7 oz (126.8 kg), last menstrual period 04/30/2012. GENERAL:  Well developed, well nourished, heavyset woman sitting comfortably in the exam room in no acute distress. MENTAL STATUS:  Alert and oriented to person, place and time. HEAD:  Short brown with red hair anteriorly.  Normocephalic, atraumatic, face symmetric, no Cushingoid features. EYES:  Brown eyes.  Pupils equal round and reactive to light and accomodation.  No conjunctivitis or scleral icterus. ENT:  Oropharynx clear without lesion.  Tongue normal. Mucous membranes moist.  RESPIRATORY:  Clear to auscultation without rales, wheezes or rhonchi. CARDIOVASCULAR:  Regular rate and rhythm without murmur, rub or gallop. ABDOMEN:  Soft, non-tender, with active bowel sounds, and no appreciable hepatosplenomegaly.  No masses. SKIN:  No rashes, ulcers or lesions. EXTREMITIES: No edema, no skin discoloration or tenderness.  No palpable cords. LYMPH NODES: No palpable cervical, supraclavicular, axillary or inguinal adenopathy  NEUROLOGICAL: Unremarkable. PSYCH:  Appropriate.   No visits with results within 3 Day(s) from this visit.  Latest known visit with results is:  Admission on 05/17/2015, Discharged on 05/17/2015  Component Date Value Ref Range Status  . Sodium 05/16/2015 139  135 - 145 mmol/L Final  . Potassium 05/16/2015 3.8  3.5 - 5.1 mmol/L Final  . Chloride 05/16/2015 106  101 - 111 mmol/L Final  . CO2 05/16/2015 32  22 - 32 mmol/L Final  . Glucose, Bld 05/16/2015 82  65 - 99 mg/dL Final  . BUN 05/16/2015 11  6 - 20 mg/dL Final  . Creatinine, Ser 05/16/2015 0.82  0.44 - 1.00 mg/dL Final  . Calcium 05/16/2015 9.2  8.9 - 10.3 mg/dL Final  . GFR calc non Af Amer 05/16/2015 >60  >60 mL/min Final  . GFR calc Af Amer 05/16/2015 >60  >60 mL/min Final   Comment: (NOTE) The eGFR has been calculated using the CKD EPI equation. This calculation has not been validated in all clinical situations. eGFR's persistently <60 mL/min signify  possible Chronic Kidney Disease.   . Anion gap 05/16/2015 1* 5 - 15 Final  . Troponin I 05/16/2015 <0.03  <0.031 ng/mL Final   Comment:        NO INDICATION OF MYOCARDIAL INJURY.   . WBC 05/16/2015 11.1* 3.6 - 11.0 K/uL Final  . RBC 05/16/2015 4.70  3.80 - 5.20 MIL/uL Final  . Hemoglobin 05/16/2015 13.3  12.0 - 16.0 g/dL Final  . HCT 05/16/2015 40.2  35.0 - 47.0 % Final  . MCV 05/16/2015 85.5  80.0 - 100.0 fL Final  . MCH 05/16/2015 28.3  26.0 - 34.0 pg Final  . MCHC 05/16/2015 33.1  32.0 - 36.0 g/dL Final  . RDW 05/16/2015 14.2  11.5 - 14.5 % Final  . Platelets 05/16/2015 360  150 - 440 K/uL Final   [facility] Labs: CBC on 11/26/2015 included a hematocrit 39, hemoglobin 13, MCV 85, platelets 412,000, white count 8500  with an No Name of 4000.  Absolute lymphocyte count was 3400.  JAK2 V617F with reflex is pending.  Hemachromatosis assay is pending.  ESR was 2.  CRP was 3.8.  Ferritin was 301.  Assessment:  Shelley [name] is a 55 y.o. female with an elevated ferritin and mild thrombocytosis.  She has had mild thrombocytosis dating back to 2016.  Platelet count has ranged between 416,000 - 434,000 without trend.  She has had an elevated ferritin since 2016.  Her ferritin is probably an acute phase reactant.  Ferritin has ranged between 286 - 390.  She was on oral iron for many years.  She has been postmenopausal for several years (after age 30).  Iron saturation was 31% (normal) on 10/04/2015.  She has arthritis and an elevated rheumatoid factor.  Her arthritis is not likely due to hemochromatosis.  She denies any liver disease, diabetes, or skin color changes.  Plan: 1.  Discuss patient's labs.  She has an elevated ferritin which appears to be an acute phase reactant.  ESR is typically elevated, but is normal.  Check CRP.  Discuss ferritin as a reflection of iron stores, unless falsely elevated by inflammation.  Discuss hemochromatosis genetic testing.    Discuss elevated platelet  count.  This may also be an acute phase reactant.  Doubt a myeloproliferative disorder such as essential thrombocythemia (ET).  Send testing for JAK2 with reflex.  2.  [facility] slip for CBC with diff, JAK2 with reflex, hemochromatosis assay, ESR, CRP, ferritin. 3.  RTC in 2 weeks for MD review of [facility] labs.   Lequita Asal, MD  11/26/2015

## 2015-11-26 NOTE — Progress Notes (Signed)
Patient here today as new evaluation regarding hemochromatosis elevated RA factor.  Referred by Dr. Etta Quill NP, Nira Conn.

## 2015-12-10 ENCOUNTER — Inpatient Hospital Stay: Payer: 59 | Attending: Hematology and Oncology | Admitting: Hematology and Oncology

## 2015-12-10 ENCOUNTER — Encounter: Payer: Self-pay | Admitting: Hematology and Oncology

## 2015-12-10 VITALS — BP 133/82 | HR 80 | Temp 96.7°F | Resp 18 | Wt 280.9 lb

## 2015-12-10 DIAGNOSIS — D473 Essential (hemorrhagic) thrombocythemia: Secondary | ICD-10-CM | POA: Diagnosis not present

## 2015-12-10 DIAGNOSIS — K760 Fatty (change of) liver, not elsewhere classified: Secondary | ICD-10-CM | POA: Diagnosis not present

## 2015-12-10 DIAGNOSIS — R160 Hepatomegaly, not elsewhere classified: Secondary | ICD-10-CM | POA: Diagnosis not present

## 2015-12-10 DIAGNOSIS — I517 Cardiomegaly: Secondary | ICD-10-CM | POA: Diagnosis not present

## 2015-12-10 DIAGNOSIS — M1990 Unspecified osteoarthritis, unspecified site: Secondary | ICD-10-CM | POA: Diagnosis not present

## 2015-12-10 DIAGNOSIS — R7989 Other specified abnormal findings of blood chemistry: Secondary | ICD-10-CM | POA: Diagnosis not present

## 2015-12-10 DIAGNOSIS — I10 Essential (primary) hypertension: Secondary | ICD-10-CM | POA: Diagnosis not present

## 2015-12-10 DIAGNOSIS — D75839 Thrombocytosis, unspecified: Secondary | ICD-10-CM

## 2015-12-10 DIAGNOSIS — M129 Arthropathy, unspecified: Secondary | ICD-10-CM | POA: Insufficient documentation

## 2015-12-10 NOTE — Progress Notes (Signed)
Patient offers no complaints today. 

## 2015-12-10 NOTE — Progress Notes (Addendum)
[name]:  12/10/2015   Chief Complaint: Shelley [name] is a 55 y.o. female with an elevated ferritin and mild thrombocytosis who is seen for review of interval labs and discussion regarding direction of therapy.  HPI:  She was last seen in the hematology clinic on 11/26/2015 for initial consultation.  She has had mild thrombocytosis dating back to 2016.  Platelet count had ranged between 416,000 - 434,000 without trend.  She has had an elevated ferritin since 2016.  Ferritin had ranged between 286 - 390.  She was on oral iron for many years.  She had been postmenopausal for several years (after age 57).  Iron saturation was 31% (normal) on 10/04/2015.  She had arthritis and an elevated rheumatoid factor.    She underwent a work-up through [facility].  CBC revealed a hematocrit 39, hemoglobin 13, MCV 85, platelets 412,000, white count 8500 with an ANC of 4000.  Absolute lymphocyte count was 3400.  Hemachromatosis assay was negative.  ESR was 2 and CRP was 3.8 (both normal).  Ferritin was 301.  JAK2 V617F with reflex to CALR and MPL is pending.  Symptomatically, she denies any new complaints.   Past Medical History:  Diagnosis Date  . Anemia   . Arthritis   . Cardiomegaly   . Hypertension     Past Surgical History:  Procedure Laterality Date  . TUBAL LIGATION      Family History  Problem Relation Age of Onset  . Cancer Other     Social History:  reports that she has never smoked. She has never used smokeless tobacco. She reports that she does not drink alcohol or use drugs.  The patient is accompanied by her daughter today.  Allergies: No Known Allergies  Current Medications: Current Outpatient Prescriptions  Medication Sig Dispense Refill  . acetaminophen (TYLENOL) 500 MG tablet Take 1,000 mg by mouth every 6 (six) hours as needed for pain.    Marland Kitchen amLODipine (NORVASC) 5 MG tablet Take 5 mg by mouth daily.    Marland Kitchen  levonorgestrel (MIRENA) 20 MCG/24HR IUD 1 each by Intrauterine route once.    . meclizine (ANTIVERT) 25 MG tablet Take 1 tablet (25 mg total) by mouth 3 (three) times daily as needed for dizziness. 20 tablet 0  . meloxicam (MOBIC) 15 MG tablet Take 15 mg by mouth daily.     No current facility-administered medications for this visit.     Review of Systems:  GENERAL:  Feels good. No fevers, sweats or weight loss. PERFORMANCE STATUS (ECOG): 1 HEENT:  No visual changes, runny nose, sore throat, mouth sores or tenderness. Lungs: No shortness of breath or cough.  No hemoptysis. Cardiac:  No chest pain, palpitations, orthopnea, or PND. GI:  No nausea, vomiting, diarrhea, constipation, melena or hematochezia. GU:  No urgency, frequency, dysuria, or hematuria. Musculoskeletal:  No back pain.  No joint pain.  No muscle tenderness. Extremities:  No pain or swelling. Skin:  No rashes or skin changes. Neuro:  No headache, numbness or weakness, balance or coordination issues. Endocrine:  No diabetes, thyroid issues, hot flashes or night sweats. Psych:  No mood changes, depression or anxiety. Pain:  No focal pain. Review of systems:  All other systems reviewed and found to be negative.  Physical Exam: Blood pressure 133/82, pulse 80, temperature (!) 96.7 F (35.9 C), temperature source Tympanic, resp. rate 18, weight 280 lb 13.9 oz (127.4 kg), last menstrual period 04/30/2012. GENERAL:  Well developed, well nourished, heavyset woman sitting comfortably in the exam room in no acute distress. MENTAL STATUS:  Alert and oriented to person, place and time. HEAD:  Short brown hair.  Normocephalic, atraumatic, face symmetric, no Cushingoid features. EYES:  Brown eyes.  No conjunctivitis or scleral icterus. NEUROLOGICAL: Unremarkable. PSYCH:  Appropriate.   No visits with results within 3 Day(s) from this visit.  Latest known visit with results is:  Admission on 05/17/2015, Discharged on 05/17/2015   Component Date Value Ref Range Status  . Sodium 05/16/2015 139  135 - 145 mmol/L Final  . Potassium 05/16/2015 3.8  3.5 - 5.1 mmol/L Final  . Chloride 05/16/2015 106  101 - 111 mmol/L Final  . CO2 05/16/2015 32  22 - 32 mmol/L Final  . Glucose, Bld 05/16/2015 82  65 - 99 mg/dL Final  . BUN 05/16/2015 11  6 - 20 mg/dL Final  . Creatinine, Ser 05/16/2015 0.82  0.44 - 1.00 mg/dL Final  . Calcium 05/16/2015 9.2  8.9 - 10.3 mg/dL Final  . GFR calc non Af Amer 05/16/2015 >60  >60 mL/min Final  . GFR calc Af Amer 05/16/2015 >60  >60 mL/min Final   Comment: (NOTE) The eGFR has been calculated using the CKD EPI equation. This calculation has not been validated in all clinical situations. eGFR's persistently <60 mL/min signify possible Chronic Kidney Disease.   . Anion gap 05/16/2015 1* 5 - 15 Final  . Troponin I 05/16/2015 <0.03  <0.031 ng/mL Final   Comment:        NO INDICATION OF MYOCARDIAL INJURY.   . WBC 05/16/2015 11.1* 3.6 - 11.0 K/uL Final  . RBC 05/16/2015 4.70  3.80 - 5.20 MIL/uL Final  . Hemoglobin 05/16/2015 13.3  12.0 - 16.0 g/dL Final  . HCT 05/16/2015 40.2  35.0 - 47.0 % Final  . MCV 05/16/2015 85.5  80.0 - 100.0 fL Final  . MCH 05/16/2015 28.3  26.0 - 34.0 pg Final  . MCHC 05/16/2015 33.1  32.0 - 36.0 g/dL Final  . RDW 05/16/2015 14.2  11.5 - 14.5 % Final  . Platelets 05/16/2015 360  150 - 440 K/uL Final   [facility] Labs: CBC on 11/26/2015 included a hematocrit 39, hemoglobin 13, MCV 85, platelets 412,000, white count 8500 with an ANC of 4000.  Absolute lymphocyte count was 3400.  JAK2 V617F with reflex is pending.  Hemachromatosis assay is pending.  ESR was 2.  CRP was 3.8.  Ferritin was 301.   Assessment:  Shelley [name] is a 55 y.o. female with an elevated ferritin and mild thrombocytosis.  She has had mild thrombocytosis dating back to 2016.  Platelet count has ranged between 416,000 - 434,000 without trend.  She has had an elevated ferritin since 2016.   Her ferritin is probably an acute phase reactant.  Ferritin has ranged between 286 - 390.  She was on oral iron for many years.  She has been postmenopausal for several years (after age 16).  Iron saturation was 31% (normal) on 10/04/2015.  [facility] labs on 11/26/2015 revealed a hematocrit 39, hemoglobin 13, MCV 85, platelets 412,000, white count 8500 with an ANC of 4000.  Absolute lymphocyte count was 3400.  Hemachromatosis assay was negative.  ESR and CRP were normal.  Ferritin was 301.  JAK2 V617F with reflex to CALR and MPL is pending.    Abdominal ultrasound on 11/13/2015 revealed diffuse hepatic steatosis with borderline hepatomegaly.  She has arthritis and an elevated rheumatoid factor.  Her arthritis is not due to hemochromatosis.  She denies any liver disease, diabetes, or skin color changes.  Plan: 1.  Review available labs from [facility]. No evidence of hemochromatosis with negative genetic test and normal iron saturation.  Etiology of elevated ferritin felt secondary to an acute phase reactant.  Discuss mildly elevated platelet count.  Suspect this is also an acute phase reactant.  Platelet count fluctuating just above normal limits (400,000).  Doubt patient has a myeloproliferative disorder such as essential thrombocythemia (ET).  In patients with ET, JAK2 is + 60-65%, CALR 20-25%, and MPL 5%.  Only 10-15% triple negative.  Await JAK2 test with reflex (suspect will be negative).  Patient will be called if positive.    2.  RTC prn.   Lequita Asal, MD  12/10/2015, 9:20 AM

## 2016-02-21 ENCOUNTER — Other Ambulatory Visit: Payer: Self-pay | Admitting: Nurse Practitioner

## 2016-02-21 DIAGNOSIS — Z1231 Encounter for screening mammogram for malignant neoplasm of breast: Secondary | ICD-10-CM

## 2016-03-30 ENCOUNTER — Ambulatory Visit: Payer: 59 | Attending: Nurse Practitioner

## 2016-04-29 ENCOUNTER — Ambulatory Visit
Admission: RE | Admit: 2016-04-29 | Discharge: 2016-04-29 | Disposition: A | Discharge status: Home / Self Care | Payer: 59 | Source: Ambulatory Visit | Attending: Nurse Practitioner | Admitting: Nurse Practitioner

## 2016-04-29 DIAGNOSIS — Z1231 Encounter for screening mammogram for malignant neoplasm of breast: Secondary | ICD-10-CM | POA: Insufficient documentation

## 2016-05-11 ENCOUNTER — Inpatient Hospital Stay
Admission: RE | Admit: 2016-05-11 | Discharge: 2016-05-11 | Disposition: A | Discharge status: Home / Self Care | Payer: Self-pay | Source: Ambulatory Visit | Attending: *Deleted | Admitting: *Deleted

## 2016-05-11 ENCOUNTER — Other Ambulatory Visit: Payer: Self-pay | Admitting: *Deleted

## 2016-05-11 DIAGNOSIS — Z9289 Personal history of other medical treatment: Secondary | ICD-10-CM

## 2017-02-02 ENCOUNTER — Emergency Department
Admission: EM | Admit: 2017-02-02 | Discharge: 2017-02-02 | Disposition: A | Discharge status: Home / Self Care | Payer: 59 | Attending: Emergency Medicine | Admitting: Emergency Medicine

## 2017-02-02 ENCOUNTER — Emergency Department: Payer: 59

## 2017-02-02 ENCOUNTER — Other Ambulatory Visit: Payer: Self-pay

## 2017-02-02 DIAGNOSIS — I10 Essential (primary) hypertension: Secondary | ICD-10-CM | POA: Insufficient documentation

## 2017-02-02 DIAGNOSIS — Z79899 Other long term (current) drug therapy: Secondary | ICD-10-CM | POA: Diagnosis not present

## 2017-02-02 DIAGNOSIS — R55 Syncope and collapse: Secondary | ICD-10-CM

## 2017-02-02 LAB — COMPREHENSIVE METABOLIC PANEL
ALT: 18 U/L (ref 14–54)
AST: 23 U/L (ref 15–41)
Albumin: 4.2 g/dL (ref 3.5–5.0)
Alkaline Phosphatase: 43 U/L (ref 38–126)
Anion gap: 8 (ref 5–15)
BILIRUBIN TOTAL: 0.8 mg/dL (ref 0.3–1.2)
BUN: 10 mg/dL (ref 6–20)
CHLORIDE: 105 mmol/L (ref 101–111)
CO2: 25 mmol/L (ref 22–32)
CREATININE: 0.84 mg/dL (ref 0.44–1.00)
Calcium: 9 mg/dL (ref 8.9–10.3)
Glucose, Bld: 94 mg/dL (ref 65–99)
POTASSIUM: 3.5 mmol/L (ref 3.5–5.1)
Sodium: 138 mmol/L (ref 135–145)
TOTAL PROTEIN: 7.4 g/dL (ref 6.5–8.1)

## 2017-02-02 LAB — CBC
HEMATOCRIT: 40 % (ref 35.0–47.0)
Hemoglobin: 13.3 g/dL (ref 12.0–16.0)
MCH: 28.9 pg (ref 26.0–34.0)
MCHC: 33.3 g/dL (ref 32.0–36.0)
MCV: 86.6 fL (ref 80.0–100.0)
PLATELETS: 343 10*3/uL (ref 150–440)
RBC: 4.62 MIL/uL (ref 3.80–5.20)
RDW: 14.2 % (ref 11.5–14.5)
WBC: 9.3 10*3/uL (ref 3.6–11.0)

## 2017-02-02 LAB — TROPONIN I

## 2017-02-02 MED ORDER — SODIUM CHLORIDE 0.9 % IV SOLN
1000.0000 mL | Freq: Once | INTRAVENOUS | Status: AC
  Administered 2017-02-02: 1000 mL via INTRAVENOUS

## 2017-02-02 NOTE — ED Notes (Signed)
Pt awoke this AM and felt fine, pt reports she remembers waking up on ground outside next. Pt believes that she fell down steps. Pt does not remember falling. No hx of syncope. C/o right sided pain down body from fall. Pt was able to get up with granddaughter assistance. Pt alert and oriented X4, active, cooperative, pt in NAD. RR even and unlabored, color WNL.

## 2017-02-02 NOTE — ED Notes (Signed)
Patient transported to X-ray 

## 2017-02-02 NOTE — ED Triage Notes (Signed)
Pt states she fell on the steps this morning , states she does not remember how she fell, when asked if she had LOC but states she is not sure. Pt c/o right arm and leg pain. Pt states she has been having SOB since yesterday. Respirations WNL on arrival in NAD,.

## 2017-02-02 NOTE — ED Provider Notes (Signed)
[facility] Emergency Department Provider Note   ____________________________________________    I have reviewed the triage vital signs and the nursing notes.   HISTORY  Chief Complaint Loss of Consciousness and Fall     HPI Shelley [name] is a 56 y.o. female who presents after syncopal episode and fall.  Patient reports she felt well this morning got up took a shower ate breakfast and was walking to take the trash out when she apparently syncopized, she woke up on the ground.  She complains of right hip pain which is moderate and aching in nature but she is able to move her leg well.  Mild shoulder pain but full range of motion of the right upper extremity.  No head injury.  No neck pain.  Denies palpitations, no chest pain, does describe mild shortness of breath which may be chronic.  Does not smoke.  No drugs.   Past Medical History:  Diagnosis Date  . Anemia   . Arthritis   . Cardiomegaly   . Hypertension     Patient Active Problem List   Diagnosis Date Noted  . Elevated ferritin 11/26/2015  . Thrombocytosis ([place]) 11/26/2015    Past Surgical History:  Procedure Laterality Date  . TUBAL LIGATION      Prior to Admission medications   Medication Sig Start Date End Date Taking? Authorizing Provider  albuterol (PROVENTIL HFA;VENTOLIN HFA) 108 (90 Base) MCG/ACT inhaler Inhale 1-2 puffs into the lungs every 6 (six) hours as needed for wheezing or shortness of breath.   Yes [provider]  amLODipine (NORVASC) 5 MG tablet Take 5 mg by mouth daily.   Yes [provider]  Multiple Vitamin (MULTIVITAMIN) tablet Take 1 tablet by mouth daily.   Yes [provider]  acetaminophen (TYLENOL) 500 MG tablet Take 1,000 mg by mouth every 6 (six) hours as needed for pain.    [provider]  meclizine (ANTIVERT) 25 MG tablet Take 1 tablet (25 mg total) by mouth 3 (three) times daily as needed for dizziness.  12/31/14   Harvest Dark, MD  meloxicam (MOBIC) 15 MG tablet Take 15 mg by mouth daily as needed.     [provider]     Allergies Patient has no known allergies.  Family History  Problem Relation Age of Onset  . Cancer Other   . Breast cancer Maternal Grandmother   . Breast cancer Cousin        1st maternal cousin    Social History Social History   Tobacco Use  . Smoking status: Never Smoker  . Smokeless tobacco: Never Used  Substance Use Topics  . Alcohol use: No  . Drug use: No    Review of Systems  Constitutional: No dizziness currently Eyes: No visual changes.  ENT: No neck pain Cardiovascular: Denies chest pain. Respiratory: Denies shortness of breath. Gastrointestinal: No abdominal pain.  No nausea, no vomiting.   Genitourinary: Negative for dysuria. Musculoskeletal: No back pain, right hip pain Skin: Negative for laceration or abrasion Neurological: Negative for headaches or focal weakness   ____________________________________________   PHYSICAL EXAM:  VITAL SIGNS: ED Triage Vitals  Enc Vitals Group     BP 02/02/17 0825 (!) 163/87     Pulse Rate 02/02/17 0825 81     Resp 02/02/17 0825 17     Temp 02/02/17 0825 98.4 F (36.9 C)     Temp Source 02/02/17 0825 Oral     SpO2 02/02/17 0825  97 %     Weight 02/02/17 0826 129.3 kg (285 lb)     Height 02/02/17 0826 1.676 m (5\' 6" )     Head Circumference --      Peak Flow --      Pain Score 02/02/17 0825 8     Pain Loc --      Pain Edu? --      Excl. in [place]? --    Constitutional: Alert and oriented. No acute distress. Pleasant and interactive Eyes: Conjunctivae are normal.  Head: Atraumatic. Nose: No congestion/rhinnorhea. Mouth/Throat: Mucous membranes are moist.   Neck:  Painless ROM, no vertebral tenderness to palpation Cardiovascular: Normal rate, regular rhythm. Grossly normal heart sounds.  Good peripheral circulation. Respiratory: Normal respiratory effort.  No retractions.  Lungs CTAB. Gastrointestinal: Soft and nontender. No distention.  No CVA tenderness. Genitourinary: deferred Musculoskeletal: Full range of motion of both lower extremities.  No pain with axial load of the right hip.  Mild tenderness to palpation along the right lateral hip.  Full range of motion of the right upper extremity, no tenderness over the acromioclavicular joint.  Warm and well perfused extremities Neurologic:  Normal speech and language. No gross focal neurologic deficits are appreciated.  Skin:  Skin is warm, dry and intact. No rash noted. Psychiatric: Mood and affect are normal. Speech and behavior are normal.  ____________________________________________   LABS (all labs ordered are listed, but only abnormal results are displayed)  Labs Reviewed  CBC  COMPREHENSIVE METABOLIC PANEL  TROPONIN I  URINALYSIS, COMPLETE (UACMP) WITH MICROSCOPIC   ____________________________________________  EKG  ED ECG REPORT I, Lavonia Drafts, the attending physician, personally viewed and interpreted this ECG.  Date: 02/02/2017  Rhythm: normal sinus rhythm QRS Axis: normal Intervals: normal ST/T Wave abnormalities: normal Narrative Interpretation: no evidence of acute ischemia  ____________________________________________  RADIOLOGY  Chest x-ray demonstrates enlarged heart, she reports she has a history of this and will follow up with PCP  Pelvis x-ray negative ____________________________________________   PROCEDURES  Procedure(s) performed: No  Procedures   Critical Care performed: No ____________________________________________   INITIAL IMPRESSION / ASSESSMENT AND PLAN / ED COURSE  Pertinent labs & imaging results that were available during my care of the patient were reviewed by me and considered in my medical decision making (see chart for details).  Patient overall well-appearing in no acute distress.  Unclear cause of her syncopal episode.  Differential  diagnosis includes vasovagal syncope, dehydration less likely arrhythmia, less likely ACS  We will place the patient on cardiac monitor, obtain labs, give IV fluids, check chest x-ray and pelvis x-ray and reevaluate.  Patient with normal labs, reassuring x-rays, no arrhythmia on cardiac monitor, feels much better after IV fluids.  Ambulate well without any dizziness.  Appropriate for discharge at this time with outpatient follow-up with her PCP.  Return precautions discussed    ____________________________________________   FINAL CLINICAL IMPRESSION(S) / ED DIAGNOSES  Final diagnoses:  Syncope and collapse        Note:  This document was prepared using Dragon voice recognition software and may include unintentional dictation errors.    Lavonia Drafts, MD 02/02/17 1225

## 2017-02-02 NOTE — ED Notes (Signed)
Pt c/o right knee pain. No deformity or redness. Dr Corky Downs notified. Pt ambulated to restroom

## 2017-03-31 ENCOUNTER — Other Ambulatory Visit: Payer: Self-pay

## 2017-04-05 ENCOUNTER — Other Ambulatory Visit: Payer: Self-pay

## 2017-04-05 MED ORDER — MELOXICAM 7.5 MG PO TABS: 7.5000 mg | ORAL_TABLET | Freq: Two times a day (BID) | ORAL | 3 refills | Status: DC

## 2017-04-14 ENCOUNTER — Other Ambulatory Visit: Payer: Self-pay

## 2017-04-14 ENCOUNTER — Other Ambulatory Visit: Payer: Self-pay | Admitting: Internal Medicine

## 2017-04-15 ENCOUNTER — Telehealth: Payer: Self-pay

## 2017-04-15 NOTE — Telephone Encounter (Signed)
LEFT MESSAGE AND ASKED PT TO CALL AND [facility] A FOLLOW UP IN THE OFFICE AFTER HER ER VISIT/BR

## 2017-04-16 DIAGNOSIS — M153 Secondary multiple arthritis: Secondary | ICD-10-CM | POA: Diagnosis not present

## 2017-04-16 DIAGNOSIS — F411 Generalized anxiety disorder: Secondary | ICD-10-CM | POA: Diagnosis not present

## 2017-04-16 DIAGNOSIS — E042 Nontoxic multinodular goiter: Secondary | ICD-10-CM | POA: Diagnosis not present

## 2017-04-16 DIAGNOSIS — I10 Essential (primary) hypertension: Secondary | ICD-10-CM | POA: Diagnosis not present

## 2017-06-28 DIAGNOSIS — E042 Nontoxic multinodular goiter: Secondary | ICD-10-CM | POA: Diagnosis not present

## 2017-07-05 DIAGNOSIS — E042 Nontoxic multinodular goiter: Secondary | ICD-10-CM | POA: Diagnosis not present

## 2017-07-05 DIAGNOSIS — I10 Essential (primary) hypertension: Secondary | ICD-10-CM | POA: Diagnosis not present

## 2017-07-05 DIAGNOSIS — E669 Obesity, unspecified: Secondary | ICD-10-CM | POA: Diagnosis not present

## 2017-07-05 DIAGNOSIS — E039 Hypothyroidism, unspecified: Secondary | ICD-10-CM | POA: Diagnosis not present

## 2017-12-27 DIAGNOSIS — E042 Nontoxic multinodular goiter: Secondary | ICD-10-CM | POA: Diagnosis not present

## 2018-01-03 DIAGNOSIS — E039 Hypothyroidism, unspecified: Secondary | ICD-10-CM | POA: Diagnosis not present

## 2018-01-03 DIAGNOSIS — I10 Essential (primary) hypertension: Secondary | ICD-10-CM | POA: Diagnosis not present

## 2018-01-03 DIAGNOSIS — E042 Nontoxic multinodular goiter: Secondary | ICD-10-CM | POA: Diagnosis not present

## 2018-01-03 DIAGNOSIS — M153 Secondary multiple arthritis: Secondary | ICD-10-CM | POA: Diagnosis not present

## 2018-01-04 ENCOUNTER — Encounter: Payer: Self-pay | Admitting: Nurse Practitioner

## 2018-01-04 ENCOUNTER — Ambulatory Visit: Payer: BLUE CROSS/BLUE SHIELD | Admitting: Nurse Practitioner

## 2018-01-04 VITALS — BP 151/88 | HR 61 | Resp 16 | Ht 66.0 in | Wt 280.4 lb

## 2018-01-04 DIAGNOSIS — Z1211 Encounter for screening for malignant neoplasm of colon: Secondary | ICD-10-CM

## 2018-01-04 DIAGNOSIS — Z1239 Encounter for other screening for malignant neoplasm of breast: Secondary | ICD-10-CM | POA: Diagnosis not present

## 2018-01-04 DIAGNOSIS — I10 Essential (primary) hypertension: Secondary | ICD-10-CM

## 2018-01-04 MED ORDER — AMLODIPINE BESYLATE 2.5 MG PO TABS: 5.0000 mg | ORAL_TABLET | Freq: Every day | ORAL | 4 refills | Status: DC

## 2018-01-04 NOTE — Progress Notes (Signed)
[facility] [place], [place] [address]  Internal MEDICINE  Office Visit Note  Patient Name: Shelley [name]  [id number]  [id number]  Date of Service: 01/05/2018  Chief Complaint  Patient presents with  . Medical Management of Chronic Issues    routine follow up  . Hypertension    The patient is here for follow up. Has not been seen in the office for some time. She is overdue for screening mammogram and well woman exam with pap smear. Her blood pressure is elevated. Has been off blood pressure medication for some time. She reports a slight headache.  She had biometric screening at work and did not meet BMI criteria. She will need to enter a medically managed weight loss program in order to keep health insurance premiums from increasing.       Current Medication: Outpatient Encounter Medications as of 01/04/2018  Medication Sig  . acetaminophen (TYLENOL) 500 MG tablet Take 1,000 mg by mouth every 6 (six) hours as needed for pain.  Marland Kitchen albuterol (PROVENTIL HFA;VENTOLIN HFA) 108 (90 Base) MCG/ACT inhaler Inhale 1-2 puffs into the lungs every 6 (six) hours as needed for wheezing or shortness of breath.  Marland Kitchen amLODipine (NORVASC) 2.5 MG tablet Take 2 tablets (5 mg total) by mouth daily.  Marland Kitchen levonorgestrel (MIRENA) 20 MCG/24HR IUD 20 mcg by Intrauterine route.  . meclizine (ANTIVERT) 25 MG tablet Take 1 tablet (25 mg total) by mouth 3 (three) times daily as needed for dizziness.  . meloxicam (MOBIC) 7.5 MG tablet Take 1 tablet (7.5 mg total) by mouth 2 (two) times daily.  . Multiple Vitamin (MULTIVITAMIN) tablet Take 1 tablet by mouth daily.  Marland Kitchen SYNTHROID 25 MCG tablet Take 25 mcg by mouth daily.  . [DISCONTINUED] amLODipine (NORVASC) 5 MG tablet Take 5 mg by mouth daily.   No facility-administered encounter medications on file as of 01/04/2018.     Surgical History: Past Surgical History:  Procedure Laterality Date  . TUBAL LIGATION      Medical  History: Past Medical History:  Diagnosis Date  . Anemia   . Arthritis   . Cardiomegaly   . Hypertension     Family History: Family History  Problem Relation Age of Onset  . Cancer Other   . Breast cancer Maternal Grandmother   . Breast cancer Cousin        1st maternal cousin    Social History   Socioeconomic History  . Marital status: Single    Spouse name: Not on file  . Number of children: Not on file  . Years of education: Not on file  . Highest education level: Not on file  Occupational History  . Not on file  Social Needs  . Financial resource strain: Not on file  . Food insecurity:    Worry: Not on file    Inability: Not on file  . Transportation needs:    Medical: Not on file    Non-medical: Not on file  Tobacco Use  . Smoking status: Never Smoker  . Smokeless tobacco: Never Used  Substance and Sexual Activity  . Alcohol use: No  . Drug use: No  . Sexual activity: Yes    Birth control/protection: IUD  Lifestyle  . Physical activity:    Days per week: Not on file    Minutes per session: Not on file  . Stress: Not on file  Relationships  . Social connections:    Talks on phone: Not on file  Gets together: Not on file    Attends religious service: Not on file    Active member of club or organization: Not on file    Attends meetings of clubs or organizations: Not on file    Relationship status: Not on file  . Intimate partner violence:    Fear of current or ex partner: Not on file    Emotionally abused: Not on file    Physically abused: Not on file    Forced sexual activity: Not on file  Other Topics Concern  . Not on file  Social History Narrative  . Not on file      Review of Systems  Constitutional: Negative for activity change, chills, fatigue and unexpected weight change.  HENT: Negative for congestion, postnasal drip, rhinorrhea, sneezing and sore throat.   Eyes: Negative for redness.  Respiratory: Negative for cough, chest  tightness, shortness of breath and wheezing.   Cardiovascular: Negative for chest pain and palpitations.       Elevated bp today.  Gastrointestinal: Negative for abdominal pain, constipation, diarrhea, nausea and vomiting.  Endocrine: Negative for cold intolerance, heat intolerance, polydipsia, polyphagia and polyuria.       Currently seeing endocrinology for thyroid issues.   Musculoskeletal: Negative for arthralgias, back pain, joint swelling and neck pain.  Skin: Negative for rash.  Neurological: Positive for headaches. Negative for dizziness, tremors and numbness.  Hematological: Negative for adenopathy. Does not bruise/bleed easily.  Psychiatric/Behavioral: Negative for behavioral problems (Depression), dysphoric mood, sleep disturbance and suicidal ideas. The patient is not nervous/anxious.     Today's Vitals   01/04/18 1127  BP: (!) 151/88  Pulse: 61  Resp: 16  SpO2: 98%  Weight: 280 lb 6.4 oz (127.2 kg)  Height: 5\' 6"  (1.676 m)    Physical Exam  Constitutional: She is oriented to person, place, and time. She appears well-developed and well-nourished. No distress.  HENT:  Head: Normocephalic and atraumatic.  Mouth/Throat: Oropharynx is clear and moist. No oropharyngeal exudate.  Eyes: Pupils are equal, round, and reactive to light. EOM are normal.  Neck: Normal range of motion. Neck supple. No JVD present. No tracheal deviation present. No thyromegaly present.  Cardiovascular: Normal rate, regular rhythm and normal heart sounds. Exam reveals no gallop and no friction rub.  No murmur heard. Pulmonary/Chest: Effort normal and breath sounds normal. No respiratory distress. She has no wheezes. She has no rales. She exhibits no tenderness.  Musculoskeletal: Normal range of motion.  Lymphadenopathy:    She has no cervical adenopathy.  Neurological: She is alert and oriented to person, place, and time. No cranial nerve deficit.  Skin: Skin is warm and dry. She is not diaphoretic.   Psychiatric: She has a normal mood and affect. Her behavior is normal. Judgment and thought content normal.  Nursing note and vitals reviewed.  Assessment/Plan: 1. Essential hypertension Add back amlodipine 2.5mg  tablets daily. Monitor blood pressure at home. Reassess at next visit.  - amLODipine (NORVASC) 2.5 MG tablet; Take 2 tablets (5 mg total) by mouth daily.  Dispense: 30 tablet; Refill: 4  2. Screening for breast cancer - MM DIGITAL SCREENING BILATERAL; Future  3. Screening for colon cancer Referral to GI for screening colonoscopy.  - Ambulatory referral to Gastroenterology  General Counseling: Khrystyna verbalizes understanding of the findings of todays visit and agrees with plan of treatment. I have discussed any further diagnostic evaluation that may be needed or ordered today. We also reviewed her medications today. she has been encouraged  to call the office with any questions or concerns that should arise related to todays visit.  Hypertension Counseling:   The following hypertensive lifestyle modification were recommended and discussed:  1. Limiting alcohol intake to less than 1 oz/day of ethanol:(24 oz of beer or 8 oz of wine or 2 oz of 100-proof whiskey). 2. Take baby ASA 81 mg daily. 3. Importance of regular aerobic exercise and losing weight. 4. Reduce dietary saturated fat and cholesterol intake for overall cardiovascular health. 5. Maintaining adequate dietary potassium, calcium, and magnesium intake. 6. Regular monitoring of the blood pressure. 7. Reduce sodium intake to less than 100 mmol/day (less than 2.3 gm of sodium or less than 6 gm of sodium choride)   This patient was seen by [place] with Dr Lavera Guise as a part of collaborative care agreement  Orders Placed This Encounter  Procedures  . MM DIGITAL SCREENING BILATERAL  . Ambulatory referral to Gastroenterology    Meds ordered this encounter  Medications  . amLODipine  (NORVASC) 2.5 MG tablet    Sig: Take 2 tablets (5 mg total) by mouth daily.    Dispense:  30 tablet    Refill:  4    Order Specific Question:   Supervising Provider    Answer:   Lavera Guise [[id number]]    Time spent: 20 Minutes      Dr Lavera Guise Internal medicine

## 2018-01-05 DIAGNOSIS — I10 Essential (primary) hypertension: Secondary | ICD-10-CM | POA: Insufficient documentation

## 2018-01-05 DIAGNOSIS — Z124 Encounter for screening for malignant neoplasm of cervix: Secondary | ICD-10-CM | POA: Insufficient documentation

## 2018-01-17 ENCOUNTER — Encounter: Payer: Self-pay | Admitting: *Deleted

## 2018-01-21 ENCOUNTER — Encounter: Payer: Self-pay | Admitting: Adult Health

## 2018-01-21 ENCOUNTER — Ambulatory Visit: Payer: BLUE CROSS/BLUE SHIELD | Admitting: Adult Health

## 2018-01-21 VITALS — BP 142/90 | HR 70 | Temp 98.4°F | Resp 16 | Ht 66.0 in | Wt 278.0 lb

## 2018-01-21 DIAGNOSIS — I10 Essential (primary) hypertension: Secondary | ICD-10-CM | POA: Diagnosis not present

## 2018-01-21 DIAGNOSIS — T50905A Adverse effect of unspecified drugs, medicaments and biological substances, initial encounter: Secondary | ICD-10-CM | POA: Diagnosis not present

## 2018-01-21 MED ORDER — LOSARTAN POTASSIUM 25 MG PO TABS: 25.0000 mg | ORAL_TABLET | Freq: Every day | ORAL | 1 refills | Status: DC

## 2018-01-21 NOTE — Progress Notes (Signed)
[facility] [place], [place] [address]  Internal MEDICINE  Office Visit Note  Patient Name: Shelley [name]  [id number]  [id number]  Date of Service: 01/21/2018  Chief Complaint  Patient presents with  . Hypertension    stopped bp medication due to felt like throat closing and pain in the chest which has subsided since stopping the norvasc      HPI Pt is here for a sick visit.  Patient reports she was recently placed on amlodipine and once she started taking it she had a sensation that her throat was closing and she had pain in her chest.  She is stopped taking medication due to these headaches and those symptoms have improved.       Current Medication:  Outpatient Encounter Medications as of 01/21/2018  Medication Sig  . acetaminophen (TYLENOL) 500 MG tablet Take 1,000 mg by mouth every 6 (six) hours as needed for pain.  Marland Kitchen albuterol (PROVENTIL HFA;VENTOLIN HFA) 108 (90 Base) MCG/ACT inhaler Inhale 1-2 puffs into the lungs every 6 (six) hours as needed for wheezing or shortness of breath.  . levonorgestrel (MIRENA) 20 MCG/24HR IUD 20 mcg by Intrauterine route.  . meclizine (ANTIVERT) 25 MG tablet Take 1 tablet (25 mg total) by mouth 3 (three) times daily as needed for dizziness.  . meloxicam (MOBIC) 7.5 MG tablet Take 1 tablet (7.5 mg total) by mouth 2 (two) times daily.  . Multiple Vitamin (MULTIVITAMIN) tablet Take 1 tablet by mouth daily.  Marland Kitchen SYNTHROID 25 MCG tablet Take 25 mcg by mouth daily.  Marland Kitchen losartan (COZAAR) 25 MG tablet Take 1 tablet (25 mg total) by mouth daily.  . [DISCONTINUED] amLODipine (NORVASC) 2.5 MG tablet Take 2 tablets (5 mg total) by mouth daily. (Patient not taking: Reported on 01/21/2018)   No facility-administered encounter medications on file as of 01/21/2018.       Medical History: Past Medical History:  Diagnosis Date  . Anemia   . Arthritis   . Cardiomegaly   . Hypertension      Vital Signs: BP (!)  142/90 (BP Location: Left Arm, Patient Position: Sitting, Cuff Size: Normal)   Pulse 70   Temp 98.4 F (36.9 C) (Oral)   Resp 16   Ht 5\' 6"  (1.676 m)   Wt 278 lb (126.1 kg)   LMP 04/30/2012   SpO2 98%   BMI 44.87 kg/m    Review of Systems  Constitutional: Negative for chills, fatigue and unexpected weight change.  HENT: Negative for congestion, rhinorrhea, sneezing and sore throat.   Eyes: Negative for photophobia, pain and redness.  Respiratory: Negative for cough, chest tightness and shortness of breath.   Cardiovascular: Negative for chest pain and palpitations.  Gastrointestinal: Negative for abdominal pain, constipation, diarrhea, nausea and vomiting.  Endocrine: Negative.   Genitourinary: Negative for dysuria and frequency.  Musculoskeletal: Negative for arthralgias, back pain, joint swelling and neck pain.  Skin: Negative for rash.  Allergic/Immunologic: Negative.   Neurological: Negative for tremors and numbness.  Hematological: Negative for adenopathy. Does not bruise/bleed easily.  Psychiatric/Behavioral: Negative for behavioral problems and sleep disturbance. The patient is not nervous/anxious.     Physical Exam  Constitutional: She is oriented to person, place, and time. She appears well-developed and well-nourished. No distress.  HENT:  Head: Normocephalic and atraumatic.  Mouth/Throat: Oropharynx is clear and moist. No oropharyngeal exudate.  Eyes: Pupils are equal, round, and reactive to light. EOM are normal.  Neck: Normal range of motion.  Neck supple. No JVD present. No tracheal deviation present. No thyromegaly present.  Cardiovascular: Normal rate, regular rhythm and normal heart sounds. Exam reveals no gallop and no friction rub.  No murmur heard. Pulmonary/Chest: Effort normal and breath sounds normal. No respiratory distress. She has no wheezes. She has no rales. She exhibits no tenderness.  Abdominal: Soft. There is no tenderness. There is no guarding.   Musculoskeletal: Normal range of motion.  Lymphadenopathy:    She has no cervical adenopathy.  Neurological: She is alert and oriented to person, place, and time. No cranial nerve deficit.  Skin: Skin is warm and dry. She is not diaphoretic.  Psychiatric: She has a normal mood and affect. Her behavior is normal. Judgment and thought content normal.  Nursing note and vitals reviewed.   Assessment/Plan: 1. Hypertension, unspecified type Stop amlodipine and start losartan 25 mg daily follow-up in 4 weeks to recheck blood pressure. - losartan (COZAAR) 25 MG tablet; Take 1 tablet (25 mg total) by mouth daily.  Dispense: 30 tablet; Refill: 1  2. Adverse effect of drug, initial encounter Stop taking amlodipine.  If symptoms return follow-up in clinic as is possible or go to emergency room.  General Counseling: [name] verbalizes understanding of the findings of todays visit and agrees with plan of treatment. I have discussed any further diagnostic evaluation that may be needed or ordered today. We also reviewed her medications today. she has been encouraged to call the office with any questions or concerns that should arise related to todays visit.   No orders of the defined types were placed in this encounter.   Meds ordered this encounter  Medications  . losartan (COZAAR) 25 MG tablet    Sig: Take 1 tablet (25 mg total) by mouth daily.    Dispense:  30 tablet    Refill:  1    Time spent:25 Minutes  This patient was seen by Orson Gear AGNP-C in Collaboration with Dr Lavera Guise as a part of collaborative care agreement.  Kendell Bane AGNP-C Internal Medicine

## 2018-01-21 NOTE — Patient Instructions (Signed)

## 2018-02-02 ENCOUNTER — Encounter: Payer: Self-pay | Admitting: Nurse Practitioner

## 2018-02-21 ENCOUNTER — Encounter: Payer: Self-pay | Admitting: Adult Health

## 2018-02-21 ENCOUNTER — Ambulatory Visit: Payer: BLUE CROSS/BLUE SHIELD | Admitting: Adult Health

## 2018-02-21 VITALS — BP 138/84 | HR 88 | Resp 16 | Ht 66.0 in | Wt 283.0 lb

## 2018-02-21 DIAGNOSIS — I10 Essential (primary) hypertension: Secondary | ICD-10-CM | POA: Diagnosis not present

## 2018-02-21 DIAGNOSIS — Z889 Allergy status to unspecified drugs, medicaments and biological substances status: Secondary | ICD-10-CM

## 2018-02-21 MED ORDER — LOSARTAN POTASSIUM 25 MG PO TABS: 25.0000 mg | ORAL_TABLET | Freq: Every day | ORAL | 5 refills | Status: DC

## 2018-02-21 NOTE — Progress Notes (Signed)
[facility], [place] [address]  Internal MEDICINE  Office Visit Note  Patient Name: Shelley [name]  [id number]  [id number]  Date of Service: 02/21/2018  Chief Complaint  Patient presents with  . Hypertension    HPI Pt is here for follow up on HTN.  She previously had an allergic reaction to amlodipine, and was changed to Losartan 25mg  daily.   Patient reports excellent results using this medication.  She denies any chest pain, headache, palpitations or other side effects.  Her blood pressure today is 138/84.     Current Medication: Outpatient Encounter Medications as of 02/21/2018  Medication Sig  . acetaminophen (TYLENOL) 500 MG tablet Take 1,000 mg by mouth every 6 (six) hours as needed for pain.  Marland Kitchen albuterol (PROVENTIL HFA;VENTOLIN HFA) 108 (90 Base) MCG/ACT inhaler Inhale 1-2 puffs into the lungs every 6 (six) hours as needed for wheezing or shortness of breath.  . levonorgestrel (MIRENA) 20 MCG/24HR IUD 20 mcg by Intrauterine route.  Marland Kitchen losartan (COZAAR) 25 MG tablet Take 1 tablet (25 mg total) by mouth daily.  . meclizine (ANTIVERT) 25 MG tablet Take 1 tablet (25 mg total) by mouth 3 (three) times daily as needed for dizziness.  . meloxicam (MOBIC) 7.5 MG tablet Take 1 tablet (7.5 mg total) by mouth 2 (two) times daily.  . Multiple Vitamin (MULTIVITAMIN) tablet Take 1 tablet by mouth daily.  Marland Kitchen SYNTHROID 25 MCG tablet Take 25 mcg by mouth daily.  . [DISCONTINUED] losartan (COZAAR) 25 MG tablet Take 1 tablet (25 mg total) by mouth daily.   No facility-administered encounter medications on file as of 02/21/2018.     Surgical History: Past Surgical History:  Procedure Laterality Date  . TUBAL LIGATION      Medical History: Past Medical History:  Diagnosis Date  . Anemia   . Arthritis   . Cardiomegaly   . Hypertension     Family History: Family History  Problem Relation Age of Onset  . Cancer Other   . Breast cancer  Maternal Grandmother   . Breast cancer Cousin        1st maternal cousin    Social History   Socioeconomic History  . Marital status: Single    Spouse name: Not on file  . Number of children: Not on file  . Years of education: Not on file  . Highest education level: Not on file  Occupational History  . Not on file  Social Needs  . Financial resource strain: Not on file  . Food insecurity:    Worry: Not on file    Inability: Not on file  . Transportation needs:    Medical: Not on file    Non-medical: Not on file  Tobacco Use  . Smoking status: Never Smoker  . Smokeless tobacco: Never Used  Substance and Sexual Activity  . Alcohol use: No  . Drug use: No  . Sexual activity: Yes    Birth control/protection: I.U.D.  Lifestyle  . Physical activity:    Days per week: Not on file    Minutes per session: Not on file  . Stress: Not on file  Relationships  . Social connections:    Talks on phone: Not on file    Gets together: Not on file    Attends religious service: Not on file    Active member of club or organization: Not on file    Attends meetings of clubs or organizations: Not on file  Relationship status: Not on file  . Intimate partner violence:    Fear of current or ex partner: Not on file    Emotionally abused: Not on file    Physically abused: Not on file    Forced sexual activity: Not on file  Other Topics Concern  . Not on file  Social History Narrative  . Not on file      Review of Systems  Constitutional: Negative for chills, fatigue and unexpected weight change.  HENT: Negative for congestion, rhinorrhea, sneezing and sore throat.   Eyes: Negative for photophobia, pain and redness.  Respiratory: Negative for cough, chest tightness and shortness of breath.   Cardiovascular: Negative for chest pain and palpitations.  Gastrointestinal: Negative for abdominal pain, constipation, diarrhea, nausea and vomiting.  Endocrine: Negative.   Genitourinary:  Negative for dysuria and frequency.  Musculoskeletal: Negative for arthralgias, back pain, joint swelling and neck pain.  Skin: Negative for rash.  Allergic/Immunologic: Negative.   Neurological: Negative for tremors and numbness.  Hematological: Negative for adenopathy. Does not bruise/bleed easily.  Psychiatric/Behavioral: Negative for behavioral problems and sleep disturbance. The patient is not nervous/anxious.     Vital Signs: BP 138/84   Pulse 88   Resp 16   Ht 5\' 6"  (1.676 m)   Wt 283 lb (128.4 kg)   LMP 04/30/2012   SpO2 98%   BMI 45.68 kg/m    Physical Exam Vitals signs and nursing note reviewed.  Constitutional:      General: She is not in acute distress.    Appearance: She is well-developed. She is not diaphoretic.  HENT:     Head: Normocephalic and atraumatic.     Mouth/Throat:     Pharynx: No oropharyngeal exudate.  Eyes:     Pupils: Pupils are equal, round, and reactive to light.  Neck:     Musculoskeletal: Normal range of motion and neck supple.     Thyroid: No thyromegaly.     Vascular: No JVD.     Trachea: No tracheal deviation.  Cardiovascular:     Rate and Rhythm: Normal rate and regular rhythm.     Heart sounds: Normal heart sounds. No murmur. No friction rub. No gallop.   Pulmonary:     Effort: Pulmonary effort is normal. No respiratory distress.     Breath sounds: Normal breath sounds. No wheezing or rales.  Chest:     Chest wall: No tenderness.  Abdominal:     Palpations: Abdomen is soft.     Tenderness: There is no abdominal tenderness. There is no guarding.  Musculoskeletal: Normal range of motion.  Lymphadenopathy:     Cervical: No cervical adenopathy.  Skin:    General: Skin is warm and dry.  Neurological:     Mental Status: She is alert and oriented to person, place, and time.     Cranial Nerves: No cranial nerve deficit.  Psychiatric:        Behavior: Behavior normal.        Thought Content: Thought content normal.         Judgment: Judgment normal.    Assessment/Plan: 1. Essential hypertension Patient's prescription losartan refilled at this time.  Patient will continue taking his medication once daily as prescribed.  Instructed patient that if she has any adverse effects she is to call the office immediately. - losartan (COZAAR) 25 MG tablet; Take 1 tablet (25 mg total) by mouth daily.  Dispense: 30 tablet; Refill: 5  2. History of allergic drug  reaction Patient had allergic reaction to amlodipine however appears to be doing well on losartan at this time.  Instructed patient to not take amlodipine in the future.  General Counseling: Jenene verbalizes understanding of the findings of todays visit and agrees with plan of treatment. I have discussed any further diagnostic evaluation that may be needed or ordered today. We also reviewed her medications today. she has been encouraged to call the office with any questions or concerns that should arise related to todays visit.    No orders of the defined types were placed in this encounter.   Meds ordered this encounter  Medications  . losartan (COZAAR) 25 MG tablet    Sig: Take 1 tablet (25 mg total) by mouth daily.    Dispense:  30 tablet    Refill:  5    Time spent: 20 Minutes   This patient was seen by Orson Gear AGNP-C in Collaboration with Dr Lavera Guise as a part of collaborative care agreement     Kendell Bane AGNP-C Internal medicine

## 2018-02-21 NOTE — Patient Instructions (Signed)

## 2018-05-09 ENCOUNTER — Other Ambulatory Visit: Payer: Self-pay

## 2018-05-09 ENCOUNTER — Ambulatory Visit (INDEPENDENT_AMBULATORY_CARE_PROVIDER_SITE_OTHER): Payer: 59 | Admitting: Nurse Practitioner

## 2018-05-09 ENCOUNTER — Encounter: Payer: Self-pay | Admitting: Nurse Practitioner

## 2018-05-09 VITALS — BP 142/92 | HR 78 | Resp 16 | Ht 66.0 in | Wt 280.2 lb

## 2018-05-09 DIAGNOSIS — J302 Other seasonal allergic rhinitis: Secondary | ICD-10-CM | POA: Diagnosis not present

## 2018-05-09 DIAGNOSIS — I10 Essential (primary) hypertension: Secondary | ICD-10-CM | POA: Diagnosis not present

## 2018-05-09 DIAGNOSIS — Z124 Encounter for screening for malignant neoplasm of cervix: Secondary | ICD-10-CM

## 2018-05-09 DIAGNOSIS — R300 Dysuria: Secondary | ICD-10-CM

## 2018-05-09 DIAGNOSIS — F419 Anxiety disorder, unspecified: Secondary | ICD-10-CM

## 2018-05-09 DIAGNOSIS — E039 Hypothyroidism, unspecified: Secondary | ICD-10-CM | POA: Diagnosis not present

## 2018-05-09 DIAGNOSIS — Z0001 Encounter for general adult medical examination with abnormal findings: Secondary | ICD-10-CM | POA: Diagnosis not present

## 2018-05-09 DIAGNOSIS — Z1239 Encounter for other screening for malignant neoplasm of breast: Secondary | ICD-10-CM

## 2018-05-09 MED ORDER — MONTELUKAST SODIUM 10 MG PO TABS: 10.0000 mg | ORAL_TABLET | Freq: Every day | ORAL | 3 refills | Status: DC

## 2018-05-09 MED ORDER — ALPRAZOLAM 0.25 MG PO TABS: ORAL_TABLET | ORAL | 0 refills | Status: DC

## 2018-05-09 NOTE — Progress Notes (Signed)
[facility] [place], [place] [address]  Internal MEDICINE  Office Visit Note  Patient Name: Shelley [name]  [id number]  [id number]  Date of Service: 05/22/2018   Pt is here for routine health maintenance examination   Chief Complaint  Patient presents with  . Annual Exam  . Gynecologic Exam  . Anemia  . Arthritis  . Hypertension  . Quality Metric Gaps    mammogram due, pt did not get the flu vaccine     The patient is here for health maintenance exam and pap smear. Blood pressure is doing well, overall. She is due to have screening mammogram. She is also due to have routine, fasting labs with thyroid panel drawn. She has no concerns or complaints to discuss today. She is doing and feeling well.     Current Medication: Outpatient Encounter Medications as of 05/09/2018  Medication Sig  . acetaminophen (TYLENOL) 500 MG tablet Take 1,000 mg by mouth every 6 (six) hours as needed for pain.  Marland Kitchen albuterol (PROVENTIL HFA;VENTOLIN HFA) 108 (90 Base) MCG/ACT inhaler Inhale 1-2 puffs into the lungs every 6 (six) hours as needed for wheezing or shortness of breath.  . levonorgestrel (MIRENA) 20 MCG/24HR IUD 20 mcg by Intrauterine route.  Marland Kitchen losartan (COZAAR) 25 MG tablet Take 1 tablet (25 mg total) by mouth daily.  . meclizine (ANTIVERT) 25 MG tablet Take 1 tablet (25 mg total) by mouth 3 (three) times daily as needed for dizziness.  . meloxicam (MOBIC) 7.5 MG tablet Take 1 tablet (7.5 mg total) by mouth 2 (two) times daily.  . Multiple Vitamin (MULTIVITAMIN) tablet Take 1 tablet by mouth daily.  Marland Kitchen SYNTHROID 25 MCG tablet Take 25 mcg by mouth daily.  Marland Kitchen ALPRAZolam (XANAX) 0.25 MG tablet Take 1 tablet po QD PRN  . montelukast (SINGULAIR) 10 MG tablet Take 1 tablet (10 mg total) by mouth at bedtime.   No facility-administered encounter medications on file as of 05/09/2018.     Surgical History: Past Surgical History:  Procedure Laterality Date  .  TUBAL LIGATION      Medical History: Past Medical History:  Diagnosis Date  . Anemia   . Arthritis   . Cardiomegaly   . Hypertension     Family History: Family History  Problem Relation Age of Onset  . Cancer Other   . Breast cancer Maternal Grandmother   . Breast cancer Cousin        1st maternal cousin      Review of Systems  Constitutional: Negative for chills, fatigue and unexpected weight change.  HENT: Negative for congestion, rhinorrhea, sneezing and sore throat.        Chronic allergic rhinitis  Eyes: Negative for pain.  Respiratory: Negative for cough, chest tightness and shortness of breath.   Cardiovascular: Negative for chest pain and palpitations.  Gastrointestinal: Negative for abdominal pain, constipation, diarrhea, nausea and vomiting.  Endocrine: Negative for cold intolerance, heat intolerance, polydipsia and polyuria.       Due to have her thyroid panel tested.   Genitourinary: Negative for dysuria and frequency.  Musculoskeletal: Negative for arthralgias, back pain, joint swelling and neck pain.  Skin: Negative for rash.  Allergic/Immunologic: Positive for environmental allergies.  Neurological: Negative for tremors and numbness.  Hematological: Negative for adenopathy. Does not bruise/bleed easily.  Psychiatric/Behavioral: Negative for behavioral problems and sleep disturbance. The patient is nervous/anxious.      Today's Vitals   05/09/18 0910  BP: (!) 142/92  Pulse: 78  Resp: 16  SpO2: 96%  Weight: 280 lb 3.2 oz (127.1 kg)  Height: 5\' 6"  (1.676 m)   Body mass index is 45.23 kg/m.  Physical Exam Vitals signs and nursing note reviewed.  Constitutional:      General: She is not in acute distress.    Appearance: She is well-developed. She is obese. She is not diaphoretic.  HENT:     Head: Normocephalic and atraumatic.     Mouth/Throat:     Pharynx: No oropharyngeal exudate.  Eyes:     Conjunctiva/sclera: Conjunctivae normal.      Pupils: Pupils are equal, round, and reactive to light.  Neck:     Musculoskeletal: Normal range of motion and neck supple.     Thyroid: No thyromegaly.     Vascular: No carotid bruit or JVD.     Trachea: No tracheal deviation.  Cardiovascular:     Rate and Rhythm: Normal rate and regular rhythm.     Pulses: Normal pulses.     Heart sounds: Normal heart sounds. No murmur. No friction rub. No gallop.   Pulmonary:     Effort: Pulmonary effort is normal. No respiratory distress.     Breath sounds: Normal breath sounds. No wheezing or rales.  Chest:     Chest wall: No tenderness.     Breasts:        Right: Normal. No swelling, bleeding, inverted nipple, mass, nipple discharge, skin change or tenderness.        Left: Normal. No swelling, bleeding, inverted nipple, mass, nipple discharge, skin change or tenderness.  Abdominal:     General: Bowel sounds are normal.     Palpations: Abdomen is soft.     Tenderness: There is no abdominal tenderness.  Genitourinary:    General: Normal vulva.     Exam position: Supine.     Vagina: Normal. No vaginal discharge or erythema.     Cervix: No cervical motion tenderness, discharge or erythema.     Uterus: Normal.      Adnexa: Right adnexa normal and left adnexa normal.     Comments: No tenderness, masses, or organomeglay present during bimanual exam . IUD present and in place.  Musculoskeletal: Normal range of motion.  Lymphadenopathy:     Cervical: No cervical adenopathy.  Skin:    General: Skin is warm and dry.  Neurological:     Mental Status: She is alert and oriented to person, place, and time.     Cranial Nerves: No cranial nerve deficit.  Psychiatric:        Behavior: Behavior normal.        Thought Content: Thought content normal.        Judgment: Judgment normal.    LABS: Recent Results (from the past 2160 hour(s))  Pap IG and HPV (high risk) DNA detection     Status: None   Collection Time: 05/09/18  9:11 AM  Result Value Ref  Range   Interpretation UNS     Comment: UNSATISFACTORY FOR EVALUATION.   Category UNS     Comment: Unsatisfactory   Adequacy USSBB     Comment: Specimen processed and examined, but unsatisfactory for evaluation of epithelial abnormality because of insufficient squamous cellularity.    Clinician Provided ICD10 Comment     Comment: Z12.4   Performed by: Comment     Comment: Charlynne Cousins, Cytotechnologist (ASCP)   QC reviewed by: Comment     Comment: Lilly Cove, Cytotechnologist (ASCP)  Note: Comment     Comment: The Pap smear is a screening test designed to aid in the detection of premalignant and malignant conditions of the uterine cervix.  It is not a diagnostic procedure and should not be used as the sole means of detecting cervical cancer.  Both false-positive and false-negative reports do occur.    Test Methodology CANCELED     Comment: The Thin Prep(R) Imager was unable to read this specimen.  Therefore a manual review was performed.  Result canceled by the ancillary.    HPV, high-risk Negative Negative    Comment: This nucleic acid amplification high-risk HPV test detects thirteen high-risk types (16,18,31,33,35,39,45,51,52,56,58,59,68) without differentiation.   UA/M w/rflx Culture, Routine     Status: None   Collection Time: 05/09/18  9:13 AM  Result Value Ref Range   Specific Gravity, UA 1.008 1.005 - 1.030   pH, UA 6.0 5.0 - 7.5   Color, UA Yellow Yellow   Appearance Ur Clear Clear   Leukocytes, UA Negative Negative   Protein, UA Negative Negative/Trace   Glucose, UA Negative Negative   Ketones, UA Negative Negative   RBC, UA Negative Negative   Bilirubin, UA Negative Negative   Urobilinogen, Ur 0.2 0.2 - 1.0 mg/dL   Nitrite, UA Negative Negative   Microscopic Examination Comment     Comment: Microscopic follows if indicated.   Microscopic Examination See below:     Comment: Microscopic was indicated and was performed.   Urinalysis Reflex Comment      Comment: This specimen will not reflex to a Urine Culture.  Microscopic Examination     Status: None   Collection Time: 05/09/18  9:13 AM  Result Value Ref Range   WBC, UA 0-5 0 - 5 /hpf   RBC, UA 0-2 0 - 2 /hpf   Epithelial Cells (non renal) 0-10 0 - 10 /hpf   Casts None seen None seen /lpf   Mucus, UA Present Not Estab.   Bacteria, UA Few None seen/Few   Assessment/Plan:  1. Encounter for general adult medical examination with abnormal findings Annual health maintenance exam today.   2. Hypertension, unspecified type Generally stable. Continue bp medication as prescribed.   3. Acquired hypothyroidism Check thyroid panel and adjust levothyroxine as indicated.   4. Seasonal allergies Continue singulair every day.  - montelukast (SINGULAIR) 10 MG tablet; Take 1 tablet (10 mg total) by mouth at bedtime.  Dispense: 30 tablet; Refill: 3  5. Acute anxiety May take alprazolam 0.25mg  once daily lf needed for acute anxiety. New prescription sent to her pharmacy today.  - ALPRAZolam (XANAX) 0.25 MG tablet; Take 1 tablet po QD PRN  Dispense: 30 tablet; Refill: 0  6. Routine cervical smear - Pap IG and HPV (high risk) DNA detection  7. Screening for breast cancer - MM DIGITAL SCREENING BILATERAL; Future  8. Dysuria - UA/M w/rflx Culture, Routine  General Counseling: Annel verbalizes understanding of the findings of todays visit and agrees with plan of treatment. I have discussed any further diagnostic evaluation that may be needed or ordered today. We also reviewed her medications today. she has been encouraged to call the office with any questions or concerns that should arise related to todays visit.    Counseling:  Hypertension Counseling:   The following hypertensive lifestyle modification were recommended and discussed:  1. Limiting alcohol intake to less than 1 oz/day of ethanol:(24 oz of beer or 8 oz of wine or 2 oz of 100-proof whiskey). 2.  Take baby ASA 81 mg  daily. 3. Importance of regular aerobic exercise and losing weight. 4. Reduce dietary saturated fat and cholesterol intake for overall cardiovascular health. 5. Maintaining adequate dietary potassium, calcium, and magnesium intake. 6. Regular monitoring of the blood pressure. 7. Reduce sodium intake to less than 100 mmol/day (less than 2.3 gm of sodium or less than 6 gm of sodium choride)   This patient was seen by Sherrill with Dr Lavera Guise as a part of collaborative care agreement  Orders Placed This Encounter  Procedures  . Microscopic Examination  . MM DIGITAL SCREENING BILATERAL  . UA/M w/rflx Culture, Routine    Meds ordered this encounter  Medications  . ALPRAZolam (XANAX) 0.25 MG tablet    Sig: Take 1 tablet po QD PRN    Dispense:  30 tablet    Refill:  0    Order Specific Question:   Supervising Provider    Answer:   Lavera Guise [place]  . montelukast (SINGULAIR) 10 MG tablet    Sig: Take 1 tablet (10 mg total) by mouth at bedtime.    Dispense:  30 tablet    Refill:  3    Order Specific Question:   Supervising Provider    Answer:   Lavera Guise [[id number]]    Time spent: [place], MD  Internal Medicine

## 2018-05-10 LAB — UA/M W/RFLX CULTURE, ROUTINE
Bilirubin, UA: NEGATIVE
Glucose, UA: NEGATIVE
Ketones, UA: NEGATIVE
LEUKOCYTES UA: NEGATIVE
Nitrite, UA: NEGATIVE
PH UA: 6 (ref 5.0–7.5)
Protein, UA: NEGATIVE
RBC UA: NEGATIVE
SPEC GRAV UA: 1.008 (ref 1.005–1.030)
Urobilinogen, Ur: 0.2 mg/dL (ref 0.2–1.0)

## 2018-05-10 LAB — MICROSCOPIC EXAMINATION: CASTS: NONE SEEN /LPF

## 2018-05-13 LAB — PAP IG AND HPV HIGH-RISK: HPV, HIGH-RISK: NEGATIVE

## 2018-05-22 DIAGNOSIS — E039 Hypothyroidism, unspecified: Secondary | ICD-10-CM | POA: Insufficient documentation

## 2018-05-22 DIAGNOSIS — J302 Other seasonal allergic rhinitis: Secondary | ICD-10-CM | POA: Insufficient documentation

## 2018-05-22 DIAGNOSIS — R300 Dysuria: Secondary | ICD-10-CM | POA: Insufficient documentation

## 2018-05-22 DIAGNOSIS — F419 Anxiety disorder, unspecified: Secondary | ICD-10-CM | POA: Insufficient documentation

## 2018-06-16 ENCOUNTER — Other Ambulatory Visit: Payer: Self-pay

## 2018-06-16 DIAGNOSIS — I10 Essential (primary) hypertension: Secondary | ICD-10-CM

## 2018-06-16 MED ORDER — LOSARTAN POTASSIUM 25 MG PO TABS: 25.0000 mg | ORAL_TABLET | Freq: Every day | ORAL | 0 refills | Status: DC

## 2018-07-20 ENCOUNTER — Other Ambulatory Visit: Payer: Self-pay

## 2018-07-20 ENCOUNTER — Ambulatory Visit: Payer: 59

## 2018-07-20 DIAGNOSIS — I10 Essential (primary) hypertension: Secondary | ICD-10-CM

## 2018-07-20 MED ORDER — MELOXICAM 7.5 MG PO TABS: 7.5000 mg | ORAL_TABLET | Freq: Two times a day (BID) | ORAL | 3 refills | Status: DC

## 2018-07-20 MED ORDER — LOSARTAN POTASSIUM 25 MG PO TABS: 25.0000 mg | ORAL_TABLET | Freq: Every day | ORAL | 1 refills | Status: DC

## 2018-08-09 ENCOUNTER — Other Ambulatory Visit: Payer: Self-pay

## 2018-08-09 ENCOUNTER — Ambulatory Visit
Admission: RE | Admit: 2018-08-09 | Discharge: 2018-08-09 | Disposition: A | Discharge status: Home / Self Care | Payer: 59 | Source: Ambulatory Visit | Attending: Nurse Practitioner | Admitting: Nurse Practitioner

## 2018-08-09 DIAGNOSIS — Z1231 Encounter for screening mammogram for malignant neoplasm of breast: Secondary | ICD-10-CM | POA: Insufficient documentation

## 2018-08-09 DIAGNOSIS — Z1239 Encounter for other screening for malignant neoplasm of breast: Secondary | ICD-10-CM

## 2018-11-09 ENCOUNTER — Ambulatory Visit
Admission: EM | Admit: 2018-11-09 | Discharge: 2018-11-09 | Disposition: A | Discharge status: Home / Self Care | Payer: 59 | Attending: Emergency Medicine | Admitting: Emergency Medicine

## 2018-11-09 ENCOUNTER — Other Ambulatory Visit: Payer: Self-pay

## 2018-11-09 DIAGNOSIS — M25512 Pain in left shoulder: Secondary | ICD-10-CM | POA: Diagnosis not present

## 2018-11-09 DIAGNOSIS — Y999 Unspecified external cause status: Secondary | ICD-10-CM | POA: Diagnosis not present

## 2018-11-09 MED ORDER — TIZANIDINE HCL 4 MG PO TABS: 4.0000 mg | ORAL_TABLET | Freq: Three times a day (TID) | ORAL | 0 refills | Status: DC | PRN

## 2018-11-09 MED ORDER — IBUPROFEN 600 MG PO TABS: 600.0000 mg | ORAL_TABLET | Freq: Four times a day (QID) | ORAL | 0 refills | Status: DC | PRN

## 2018-11-09 NOTE — ED Triage Notes (Signed)
Patient complains of left shoulder pain x 2 days without injury.

## 2018-11-09 NOTE — ED Provider Notes (Signed)
HPI  SUBJECTIVE:  Shelley [name] is a right-handed 58 y.o. female who presents with 2 days of left shoulder pain that radiates down her arm.  It is sharp, dull, intermittent, lasting hours.  No chest pain, shortness of breath, it is not associated with exertion.  No nausea, vomiting, diaphoresis.  It does not radiate up her neck into her jaw.  No change in physical activity, recent trauma.  No neck pain.  No repetitive activity.  No numbness in her hand although she reports mild tingling.  No grip weakness, no bruising, swelling, fevers, redness.  No history of shoulder injury.  She tried 1000 mg Tylenol daily, 600 mg ibuprofen and Flexeril.  Flexeril helps.  Symptoms are worse with lying on it.  It does not seem to be associated with movement or palpation.  She has a past medical history of hypertension, osteoarthritis, cardiomegaly.  No history of smoking, MI, diabetes, hypercholesterolemia.  PMD: [facility].    Past Medical History:  Diagnosis Date  . Anemia   . Arthritis   . Cardiomegaly   . Hypertension     Past Surgical History:  Procedure Laterality Date  . TUBAL LIGATION      Family History  Problem Relation Age of Onset  . Cancer Other   . Breast cancer Maternal Grandmother   . Breast cancer Cousin        1st maternal cousin    Social History   Tobacco Use  . Smoking status: Never Smoker  . Smokeless tobacco: Never Used  Substance Use Topics  . Alcohol use: No  . Drug use: No    No current facility-administered medications for this encounter.   Current Outpatient Medications:  .  acetaminophen (TYLENOL) 500 MG tablet, Take 1,000 mg by mouth every 6 (six) hours as needed for pain., Disp: , Rfl:  .  albuterol (PROVENTIL HFA;VENTOLIN HFA) 108 (90 Base) MCG/ACT inhaler, Inhale 1-2 puffs into the lungs every 6 (six) hours as needed for wheezing or shortness of breath., Disp: , Rfl:  .  ALPRAZolam (XANAX) 0.25 MG tablet, Take 1 tablet po QD PRN, Disp: 30 tablet,  Rfl: 0 .  levonorgestrel (MIRENA) 20 MCG/24HR IUD, 20 mcg by Intrauterine route., Disp: , Rfl:  .  losartan (COZAAR) 25 MG tablet, Take 1 tablet (25 mg total) by mouth daily., Disp: 90 tablet, Rfl: 1 .  meclizine (ANTIVERT) 25 MG tablet, Take 1 tablet (25 mg total) by mouth 3 (three) times daily as needed for dizziness., Disp: 20 tablet, Rfl: 0 .  montelukast (SINGULAIR) 10 MG tablet, Take 1 tablet (10 mg total) by mouth at bedtime., Disp: 30 tablet, Rfl: 3 .  Multiple Vitamin (MULTIVITAMIN) tablet, Take 1 tablet by mouth daily., Disp: , Rfl:  .  SYNTHROID 25 MCG tablet, Take 25 mcg by mouth daily., Disp: , Rfl: 5 .  ibuprofen (ADVIL) 600 MG tablet, Take 1 tablet (600 mg total) by mouth every 6 (six) hours as needed., Disp: 30 tablet, Rfl: 0 .  tiZANidine (ZANAFLEX) 4 MG tablet, Take 1 tablet (4 mg total) by mouth every 8 (eight) hours as needed for muscle spasms., Disp: 30 tablet, Rfl: 0  No Known Allergies   ROS  As noted in HPI.   Physical Exam  BP (!) 152/96 (BP Location: Right Arm)   Pulse 68   Temp 98.1 F (36.7 C) (Oral)   Resp 18   Ht 5\' 6"  (1.676 m)   Wt 128.4 kg   SpO2 100%  BMI 45.68 kg/m   Constitutional: Well developed, well nourished, no acute distress Eyes:  EOMI, conjunctiva normal bilaterally HENT: Normocephalic, atraumatic,mucus membranes moist Respiratory: Normal inspiratory effort Cardiovascular: Normal rate GI: nondistended skin: No rash, skin intact Musculoskeletal: L shoulder with ROM normal  Drop test normal, pain aggravated with abduction past 90 degrees,clavicle NT, A/C joint tender, scapula NT, proximal humerus NT, shoulder joint  tender, Motor strength normal , Sensation intact LT over deltoid region, distal NVI with hand having grossly intact sensation and strength in the distribution of the median, radial, and ulnar nerve.  Strength 5/5 and equal bilaterally.  no pain with internal rotation, pain with external rotation, Positive tenderness in  bicipital groove, positive empty can test positive liftoff test, sign, pain aggravated with, but no instability with abduction/external rotation. Neurologic: Alert & oriented x 3, no focal neuro deficits Psychiatric: Speech and behavior appropriate   ED Course   Medications - No data to display  No orders of the defined types were placed in this encounter.   No results found for this or any previous visit (from the past 24 hour(s)). No results found.  ED Clinical Impression  1. Acute pain of left shoulder      ED Assessment/Plan  Patient's pain is fully reproducible with palpation and going through range of motion.  Suspect trapezius spasm.  She has some tenderness over the [facility] joint, but no history of trauma.  She has no other bony tenderness.  During imaging today.  Seriously doubt fracture or dislocation.  This does not seem to be cardiac.  Will send home with regularly scheduled ibuprofen 600 mg combined with 1 g of Tylenol 3-4 times a day as needed for pain, discontinue Flexeril, start Zanaflex.  Heat or ice, whichever feels better.  Follow-up with [facility] if not better in a week.  To the ER if she gets worse, chest pain, shortness of breath, nausea, diaphoresis or for other concerns.  Discussed MDM, treatment plan, and plan for follow-up with patient. Discussed sn/sx that should prompt return to the ED. patient agrees with plan.   Meds ordered this encounter  Medications  . ibuprofen (ADVIL) 600 MG tablet    Sig: Take 1 tablet (600 mg total) by mouth every 6 (six) hours as needed.    Dispense:  30 tablet    Refill:  0  . tiZANidine (ZANAFLEX) 4 MG tablet    Sig: Take 1 tablet (4 mg total) by mouth every 8 (eight) hours as needed for muscle spasms.    Dispense:  30 tablet    Refill:  0    *This clinic note was created using [employer]. Therefore, there may be occasional mistakes despite careful proofreading.   ?   Melynda Ripple, MD 11/09/18  [phone]

## 2018-11-09 NOTE — Discharge Instructions (Addendum)
regularly scheduled ibuprofen 600 mg combined with 1 g of Tylenol 3-4 times a day as needed for pain, discontinue Flexeril, start Zanaflex.  Heat or ice, whichever feels better.  Follow-up with [facility] if not better in a week.  To the ER if you get worse, chest pain, shortness of breath, nausea, diaphoresis.

## 2018-11-11 ENCOUNTER — Ambulatory Visit: Payer: 59 | Admitting: Nurse Practitioner

## 2018-11-11 ENCOUNTER — Other Ambulatory Visit: Payer: Self-pay

## 2018-11-11 ENCOUNTER — Encounter: Payer: Self-pay | Admitting: Nurse Practitioner

## 2018-11-11 VITALS — BP 145/87 | HR 72 | Resp 16 | Ht 66.0 in | Wt 284.0 lb

## 2018-11-11 DIAGNOSIS — I517 Cardiomegaly: Secondary | ICD-10-CM | POA: Diagnosis not present

## 2018-11-11 DIAGNOSIS — M25512 Pain in left shoulder: Secondary | ICD-10-CM | POA: Diagnosis not present

## 2018-11-11 DIAGNOSIS — I10 Essential (primary) hypertension: Secondary | ICD-10-CM

## 2018-11-11 DIAGNOSIS — R9431 Abnormal electrocardiogram [ECG] [EKG]: Secondary | ICD-10-CM

## 2018-11-11 MED ORDER — LOSARTAN POTASSIUM 25 MG PO TABS: 50.0000 mg | ORAL_TABLET | Freq: Every day | ORAL | 1 refills | Status: DC

## 2018-11-11 NOTE — Progress Notes (Signed)
[facility] [place], [place] [address]  Internal MEDICINE  Office Visit Note  Patient Name: Shelley [name]  [id number]  [id number]  Date of Service: 11/23/2018  Chief Complaint  Patient presents with  . Medical Management of Chronic Issues    6 month follow up  . Hypertension  . Shoulder Pain    went to urgent wednesday for left shoulder pain,was given for ibuprofen for arthritis , left is achy , throbbing     The patient is here for routine follow up. She is having left shoulder and arm pain. Has been giong on for a few days. Hurts most over the left biceps area, but aches all the way down her arm. She rates that pain as 8/10 today. On Wednesday, she had pain in her arm which she described as 10/10 in severity. She went to Urgent care. They suspected trapezius spasm and recommended ibuprofen and tylenol as needed. Pain has improved very little. She denies chest pain, chest pressure, or shortness of breath. She denies injury to the left arm. Does not get worse with exertion. Arm hurts pretty much all the time. Blood pressure slightly elevated. Was higher when she was seen Wednesday at Urgent care.       Current Medication: Outpatient Encounter Medications as of 11/11/2018  Medication Sig  . acetaminophen (TYLENOL) 500 MG tablet Take 1,000 mg by mouth every 6 (six) hours as needed for pain.  Marland Kitchen albuterol (PROVENTIL HFA;VENTOLIN HFA) 108 (90 Base) MCG/ACT inhaler Inhale 1-2 puffs into the lungs every 6 (six) hours as needed for wheezing or shortness of breath.  . ALPRAZolam (XANAX) 0.25 MG tablet Take 1 tablet po QD PRN  . ibuprofen (ADVIL) 600 MG tablet Take 1 tablet (600 mg total) by mouth every 6 (six) hours as needed.  Marland Kitchen levonorgestrel (MIRENA) 20 MCG/24HR IUD 20 mcg by Intrauterine route.  Marland Kitchen losartan (COZAAR) 25 MG tablet Take 2 tablets (50 mg total) by mouth daily.  . meclizine (ANTIVERT) 25 MG tablet Take 1 tablet (25 mg total) by mouth 3 (three) times  daily as needed for dizziness.  . montelukast (SINGULAIR) 10 MG tablet Take 1 tablet (10 mg total) by mouth at bedtime.  . Multiple Vitamin (MULTIVITAMIN) tablet Take 1 tablet by mouth daily.  Marland Kitchen SYNTHROID 25 MCG tablet Take 25 mcg by mouth daily.  Marland Kitchen tiZANidine (ZANAFLEX) 4 MG tablet Take 1 tablet (4 mg total) by mouth every 8 (eight) hours as needed for muscle spasms.  . [DISCONTINUED] losartan (COZAAR) 25 MG tablet Take 1 tablet (25 mg total) by mouth daily.   No facility-administered encounter medications on file as of 11/11/2018.     Surgical History: Past Surgical History:  Procedure Laterality Date  . TUBAL LIGATION      Medical History: Past Medical History:  Diagnosis Date  . Anemia   . Arthritis   . Cardiomegaly   . Hypertension     Family History: Family History  Problem Relation Age of Onset  . Cancer Other   . Breast cancer Maternal Grandmother   . Breast cancer Cousin        1st maternal cousin    Social History   Socioeconomic History  . Marital status: Single    Spouse name: Not on file  . Number of children: Not on file  . Years of education: Not on file  . Highest education level: Not on file  Occupational History  . Not on file  Social Needs  .  Financial resource strain: Not on file  . Food insecurity    Worry: Not on file    Inability: Not on file  . Transportation needs    Medical: Not on file    Non-medical: Not on file  Tobacco Use  . Smoking status: Never Smoker  . Smokeless tobacco: Never Used  Substance and Sexual Activity  . Alcohol use: No  . Drug use: No  . Sexual activity: Yes    Birth control/protection: I.U.D.  Lifestyle  . Physical activity    Days per week: Not on file    Minutes per session: Not on file  . Stress: Not on file  Relationships  . Social Herbalist on phone: Not on file    Gets together: Not on file    Attends religious service: Not on file    Active member of club or organization: Not on file     Attends meetings of clubs or organizations: Not on file    Relationship status: Not on file  . Intimate partner violence    Fear of current or ex partner: Not on file    Emotionally abused: Not on file    Physically abused: Not on file    Forced sexual activity: Not on file  Other Topics Concern  . Not on file  Social History Narrative  . Not on file      Review of Systems  Constitutional: Positive for fatigue. Negative for chills and unexpected weight change.  HENT: Negative for congestion, postnasal drip, rhinorrhea, sneezing and sore throat.   Respiratory: Negative for cough, chest tightness, shortness of breath and wheezing.   Cardiovascular: Negative for chest pain and palpitations.       Elevated blood pressure with left arm pain radiating down the arm and into the left chest.   Gastrointestinal: Negative for abdominal pain, constipation, diarrhea, nausea and vomiting.  Endocrine: Negative for cold intolerance, heat intolerance, polydipsia and polyuria.  Musculoskeletal: Positive for arthralgias and myalgias. Negative for back pain, joint swelling and neck pain.       Left shoulder and arm pain .  Skin: Negative for rash.  Neurological: Positive for headaches. Negative for dizziness, tremors and numbness.  Hematological: Negative for adenopathy. Does not bruise/bleed easily.  Psychiatric/Behavioral: Negative for behavioral problems (Depression), sleep disturbance and suicidal ideas. The patient is not nervous/anxious.     Today's Vitals   11/11/18 0834  BP: (!) 145/87  Pulse: 72  Resp: 16  SpO2: 95%  Weight: 284 lb (128.8 kg)  Height: 5\' 6"  (1.676 m)  PainSc: 8   PainLoc: Shoulder   Body mass index is 45.84 kg/m.  Physical Exam Vitals signs and nursing note reviewed.  Constitutional:      General: She is not in acute distress.    Appearance: Normal appearance. She is well-developed. She is obese. She is not diaphoretic.  HENT:     Head: Normocephalic and  atraumatic.     Mouth/Throat:     Pharynx: No oropharyngeal exudate.  Eyes:     Pupils: Pupils are equal, round, and reactive to light.  Neck:     Musculoskeletal: Normal range of motion and neck supple.     Thyroid: No thyromegaly.     Vascular: No JVD.     Trachea: No tracheal deviation.  Cardiovascular:     Rate and Rhythm: Normal rate and regular rhythm.     Heart sounds: Normal heart sounds. No murmur. No friction rub. No  gallop.      Comments: ECG today showing Left atrial enlargement.   -Anterior infarct -age undetermined.   -  Negative T-waves  - Anteroseptal/anterior ischemia.  Pulmonary:     Effort: Pulmonary effort is normal. No respiratory distress.     Breath sounds: Normal breath sounds. No wheezing or rales.  Chest:     Chest wall: No tenderness.  Abdominal:     General: Bowel sounds are normal.     Palpations: Abdomen is soft.  Musculoskeletal: Normal range of motion.  Lymphadenopathy:     Cervical: No cervical adenopathy.  Skin:    General: Skin is warm and dry.  Neurological:     Mental Status: She is alert and oriented to person, place, and time.     Cranial Nerves: No cranial nerve deficit.  Psychiatric:        Behavior: Behavior normal.        Thought Content: Thought content normal.        Judgment: Judgment normal.    Assessment/Plan:  1. Acute pain of left shoulder - EKG 12-Lead abnormal today showing Left atrial enlargement, Anterior infarct -age undetermined, Negative T-waves, and anteroseptal/anterior ischemia. Will get echocardiogram and exercise stress test for further evaluation .  2. Cardiomegaly - ECHOCARDIOGRAM COMPLETE; Future  3. Abnormal ECG - EXERCISE TOLERANCE TEST (ETT); Future  4. Essential hypertension Increase losartan to 50mg  daily. Discussed low sodium diet and increasing water intake.  Encouraged her to participate in low impact exercise routinely.  - losartan (COZAAR) 25 MG tablet; Take 2 tablets (50 mg total) by mouth  daily.  Dispense: 180 tablet; Refill: 1  General Counseling: Paysley verbalizes understanding of the findings of todays visit and agrees with plan of treatment. I have discussed any further diagnostic evaluation that may be needed or ordered today. We also reviewed her medications today. she has been encouraged to call the office with any questions or concerns that should arise related to todays visit.  Hypertension Counseling:   The following hypertensive lifestyle modification were recommended and discussed:  1. Limiting alcohol intake to less than 1 oz/day of ethanol:(24 oz of beer or 8 oz of wine or 2 oz of 100-proof whiskey). 2. Take baby ASA 81 mg daily. 3. Importance of regular aerobic exercise and losing weight. 4. Reduce dietary saturated fat and cholesterol intake for overall cardiovascular health. 5. Maintaining adequate dietary potassium, calcium, and magnesium intake. 6. Regular monitoring of the blood pressure. 7. Reduce sodium intake to less than 100 mmol/day (less than 2.3 gm of sodium or less than 6 gm of sodium choride)   This patient was seen by [place] with Dr Lavera Guise as a part of collaborative care agreement  Orders Placed This Encounter  Procedures  . EXERCISE TOLERANCE TEST (ETT)  . EKG 12-Lead  . ECHOCARDIOGRAM COMPLETE    Meds ordered this encounter  Medications  . losartan (COZAAR) 25 MG tablet    Sig: Take 2 tablets (50 mg total) by mouth daily.    Dispense:  180 tablet    Refill:  1    Please note increased dose    Order Specific Question:   Supervising Provider    Answer:   Lavera Guise [id number]    Time spent: 50 Minutes      Dr Lavera Guise Internal medicine

## 2018-11-23 DIAGNOSIS — R9431 Abnormal electrocardiogram [ECG] [EKG]: Secondary | ICD-10-CM | POA: Insufficient documentation

## 2018-11-23 DIAGNOSIS — I517 Cardiomegaly: Secondary | ICD-10-CM | POA: Insufficient documentation

## 2018-11-23 DIAGNOSIS — M25512 Pain in left shoulder: Secondary | ICD-10-CM | POA: Insufficient documentation

## 2018-11-25 ENCOUNTER — Ambulatory Visit: Payer: 59

## 2018-11-25 ENCOUNTER — Other Ambulatory Visit: Payer: Self-pay

## 2018-11-25 DIAGNOSIS — R9431 Abnormal electrocardiogram [ECG] [EKG]: Secondary | ICD-10-CM

## 2018-11-25 DIAGNOSIS — I517 Cardiomegaly: Secondary | ICD-10-CM

## 2018-11-28 NOTE — Progress Notes (Signed)
Consider referral to Dr. Heidi Dach for plmonary hypertension.

## 2018-12-08 ENCOUNTER — Ambulatory Visit: Payer: 59 | Admitting: Nurse Practitioner

## 2018-12-13 ENCOUNTER — Other Ambulatory Visit
Admission: RE | Admit: 2018-12-13 | Discharge: 2018-12-13 | Disposition: A | Discharge status: Home / Self Care | Payer: 59 | Source: Ambulatory Visit | Attending: Internal Medicine | Admitting: Internal Medicine

## 2018-12-13 DIAGNOSIS — Z01812 Encounter for preprocedural laboratory examination: Secondary | ICD-10-CM | POA: Diagnosis not present

## 2018-12-13 DIAGNOSIS — Z20828 Contact with and (suspected) exposure to other viral communicable diseases: Secondary | ICD-10-CM | POA: Diagnosis not present

## 2018-12-13 LAB — SARS CORONAVIRUS 2 (TAT 6-24 HRS): SARS Coronavirus 2: NEGATIVE

## 2018-12-14 ENCOUNTER — Ambulatory Visit
Admission: RE | Admit: 2018-12-14 | Discharge: 2018-12-14 | Disposition: A | Discharge status: Home / Self Care | Payer: 59 | Source: Ambulatory Visit | Attending: Nurse Practitioner | Admitting: Nurse Practitioner

## 2018-12-14 ENCOUNTER — Other Ambulatory Visit: Payer: Self-pay

## 2018-12-14 DIAGNOSIS — R9431 Abnormal electrocardiogram [ECG] [EKG]: Secondary | ICD-10-CM | POA: Diagnosis not present

## 2018-12-14 LAB — EXERCISE TOLERANCE TEST
Estimated workload: 5.8 METS
Exercise duration (min): 4 min
Exercise duration (sec): 0 s
Peak HR: 139 {beats}/min
Percent HR: 85 %
Rest HR: 78 {beats}/min

## 2018-12-22 ENCOUNTER — Encounter: Payer: Self-pay | Admitting: Nurse Practitioner

## 2018-12-22 ENCOUNTER — Other Ambulatory Visit: Payer: Self-pay

## 2018-12-22 ENCOUNTER — Ambulatory Visit (INDEPENDENT_AMBULATORY_CARE_PROVIDER_SITE_OTHER): Payer: 59 | Admitting: Nurse Practitioner

## 2018-12-22 VITALS — BP 139/90 | HR 75 | Resp 16 | Ht 66.0 in | Wt 284.0 lb

## 2018-12-22 DIAGNOSIS — I10 Essential (primary) hypertension: Secondary | ICD-10-CM | POA: Diagnosis not present

## 2018-12-22 DIAGNOSIS — E039 Hypothyroidism, unspecified: Secondary | ICD-10-CM | POA: Diagnosis not present

## 2018-12-22 DIAGNOSIS — I270 Primary pulmonary hypertension: Secondary | ICD-10-CM

## 2018-12-22 NOTE — Progress Notes (Signed)
[facility] [place], [place] [address]  Internal MEDICINE  Office Visit Note  Patient Name: Shelley [name]  [id number]  [id number]  Date of Service: 01/04/2019  Chief Complaint  Patient presents with  . Follow-up    echo and stress test results    The patient is here for follow up of echo and stress test. She had been having chest discomfort which radiates into the left shoulder and down the left arm. She was also having some shortness of breath. Her stress test is essentially unremarkable. Her echo showed normal LVF and normal diastolic function. She does have mild tricuspid regurgitation. She also has mild to moderate pulmonary hypertension. Her blood pressure is mproved today.       Current Medication: Outpatient Encounter Medications as of 12/22/2018  Medication Sig  . acetaminophen (TYLENOL) 500 MG tablet Take 1,000 mg by mouth every 6 (six) hours as needed for pain.  Marland Kitchen albuterol (PROVENTIL HFA;VENTOLIN HFA) 108 (90 Base) MCG/ACT inhaler Inhale 1-2 puffs into the lungs every 6 (six) hours as needed for wheezing or shortness of breath.  . ALPRAZolam (XANAX) 0.25 MG tablet Take 1 tablet po QD PRN  . ibuprofen (ADVIL) 600 MG tablet Take 1 tablet (600 mg total) by mouth every 6 (six) hours as needed.  Marland Kitchen levonorgestrel (MIRENA) 20 MCG/24HR IUD 20 mcg by Intrauterine route.  Marland Kitchen losartan (COZAAR) 25 MG tablet Take 2 tablets (50 mg total) by mouth daily.  . meclizine (ANTIVERT) 25 MG tablet Take 1 tablet (25 mg total) by mouth 3 (three) times daily as needed for dizziness.  . montelukast (SINGULAIR) 10 MG tablet Take 1 tablet (10 mg total) by mouth at bedtime.  . Multiple Vitamin (MULTIVITAMIN) tablet Take 1 tablet by mouth daily.  Marland Kitchen SYNTHROID 25 MCG tablet Take 25 mcg by mouth daily.  Marland Kitchen tiZANidine (ZANAFLEX) 4 MG tablet Take 1 tablet (4 mg total) by mouth every 8 (eight) hours as needed for muscle spasms.   No facility-administered encounter medications  on file as of 12/22/2018.     Surgical History: Past Surgical History:  Procedure Laterality Date  . TUBAL LIGATION      Medical History: Past Medical History:  Diagnosis Date  . Anemia   . Arthritis   . Cardiomegaly   . Hypertension     Family History: Family History  Problem Relation Age of Onset  . Cancer Other   . Breast cancer Maternal Grandmother   . Breast cancer Cousin        1st maternal cousin    Social History   Socioeconomic History  . Marital status: Single    Spouse name: Not on file  . Number of children: Not on file  . Years of education: Not on file  . Highest education level: Not on file  Occupational History  . Not on file  Social Needs  . Financial resource strain: Not on file  . Food insecurity    Worry: Not on file    Inability: Not on file  . Transportation needs    Medical: Not on file    Non-medical: Not on file  Tobacco Use  . Smoking status: Never Smoker  . Smokeless tobacco: Never Used  Substance and Sexual Activity  . Alcohol use: No  . Drug use: No  . Sexual activity: Yes    Birth control/protection: I.U.D.  Lifestyle  . Physical activity    Days per week: Not on file    Minutes  per session: Not on file  . Stress: Not on file  Relationships  . Social Herbalist on phone: Not on file    Gets together: Not on file    Attends religious service: Not on file    Active member of club or organization: Not on file    Attends meetings of clubs or organizations: Not on file    Relationship status: Not on file  . Intimate partner violence    Fear of current or ex partner: Not on file    Emotionally abused: Not on file    Physically abused: Not on file    Forced sexual activity: Not on file  Other Topics Concern  . Not on file  Social History Narrative  . Not on file      Review of Systems  Constitutional: Positive for fatigue. Negative for chills and unexpected weight change.  HENT: Negative for congestion,  postnasal drip, rhinorrhea, sneezing and sore throat.   Respiratory: Positive for shortness of breath. Negative for cough, chest tightness and wheezing.   Cardiovascular: Negative for chest pain and palpitations.  Gastrointestinal: Negative for abdominal pain, constipation, diarrhea, nausea and vomiting.  Endocrine: Negative for cold intolerance, heat intolerance, polydipsia and polyuria.  Musculoskeletal: Positive for arthralgias and myalgias. Negative for back pain, joint swelling and neck pain.       Left shoulder and arm pain . This has improved since her last visit .  Skin: Negative for rash.  Neurological: Positive for headaches. Negative for dizziness, tremors and numbness.  Hematological: Negative for adenopathy. Does not bruise/bleed easily.  Psychiatric/Behavioral: Negative for behavioral problems (Depression), sleep disturbance and suicidal ideas. The patient is not nervous/anxious.    Today's Vitals   12/22/18 1547  BP: 139/90  Pulse: 75  Resp: 16  SpO2: 99%  Weight: 284 lb (128.8 kg)  Height: 5\' 6"  (1.676 m)   Body mass index is 45.84 kg/m.  Physical Exam Vitals signs and nursing note reviewed.  Constitutional:      General: She is not in acute distress.    Appearance: Normal appearance. She is well-developed. She is obese. She is not diaphoretic.  HENT:     Head: Normocephalic and atraumatic.     Mouth/Throat:     Pharynx: No oropharyngeal exudate.  Eyes:     Pupils: Pupils are equal, round, and reactive to light.  Neck:     Musculoskeletal: Normal range of motion and neck supple.     Thyroid: No thyromegaly.     Vascular: No JVD.     Trachea: No tracheal deviation.  Cardiovascular:     Rate and Rhythm: Normal rate and regular rhythm.     Heart sounds: Normal heart sounds. No murmur. No friction rub. No gallop.      Comments:   Pulmonary:     Effort: Pulmonary effort is normal. No respiratory distress.     Breath sounds: Normal breath sounds. No wheezing or  rales.  Chest:     Chest wall: No tenderness.  Abdominal:     General: Bowel sounds are normal.     Palpations: Abdomen is soft.  Musculoskeletal: Normal range of motion.  Lymphadenopathy:     Cervical: No cervical adenopathy.  Skin:    General: Skin is warm and dry.  Neurological:     Mental Status: She is alert and oriented to person, place, and time.     Cranial Nerves: No cranial nerve deficit.  Psychiatric:  Behavior: Behavior normal.        Thought Content: Thought content normal.        Judgment: Judgment normal.   Assessment/Plan: 1. Essential hypertension Stable. Continue bp medication as prescribed   2. Pulmonary hypertension, primary ([place]) Echocardiogram showing mild to moderate pulmonary hypertension. Will refer to pulmonology for further evaluation and treatment.  - Ambulatory referral to Pulmonology  3. Acquired hypothyroidism Continue levothyroxine as prescribed   General Counseling: Nathali verbalizes understanding of the findings of todays visit and agrees with plan of treatment. I have discussed any further diagnostic evaluation that may be needed or ordered today. We also reviewed her medications today. she has been encouraged to call the office with any questions or concerns that should arise related to todays visit.  Hypertension Counseling:   The following hypertensive lifestyle modification were recommended and discussed:  1. Limiting alcohol intake to less than 1 oz/day of ethanol:(24 oz of beer or 8 oz of wine or 2 oz of 100-proof whiskey). 2. Take baby ASA 81 mg daily. 3. Importance of regular aerobic exercise and losing weight. 4. Reduce dietary saturated fat and cholesterol intake for overall cardiovascular health. 5. Maintaining adequate dietary potassium, calcium, and magnesium intake. 6. Regular monitoring of the blood pressure. 7. Reduce sodium intake to less than 100 mmol/day (less than 2.3 gm of sodium or less than 6 gm of sodium  choride)   This patient was seen by [place] with Dr Lavera Guise as a part of collaborative care agreement  Orders Placed This Encounter  Procedures  . Ambulatory referral to Pulmonology     Time spent: 25 Minutes      Dr Lavera Guise Internal medicine

## 2018-12-26 ENCOUNTER — Encounter: Payer: Self-pay | Admitting: Internal Medicine

## 2018-12-26 ENCOUNTER — Ambulatory Visit: Payer: 59 | Admitting: Internal Medicine

## 2018-12-26 ENCOUNTER — Other Ambulatory Visit: Payer: Self-pay

## 2018-12-26 VITALS — BP 130/90 | HR 74 | Temp 97.4°F | Resp 16 | Ht 66.0 in | Wt 284.0 lb

## 2018-12-26 DIAGNOSIS — R0683 Snoring: Secondary | ICD-10-CM

## 2018-12-26 DIAGNOSIS — G4710 Hypersomnia, unspecified: Secondary | ICD-10-CM

## 2018-12-26 DIAGNOSIS — R0602 Shortness of breath: Secondary | ICD-10-CM

## 2018-12-26 DIAGNOSIS — I2729 Other secondary pulmonary hypertension: Secondary | ICD-10-CM | POA: Diagnosis not present

## 2018-12-26 NOTE — Patient Instructions (Signed)

## 2018-12-26 NOTE — Progress Notes (Signed)
[facility] [address], [place] [address]  Pulmonary Sleep Medicine   Office Visit Note  Patient Name: Shelley [name] DOB: 11-30-1960 MRN [id number]  Date of Service: 12/26/2018  Complaints/HPI: Patient is here as a new patient.  Patient was being worked up for other issues and had an echocardiogram done which revealed that she had an elevated pulmonary artery pressures.  Patient states that she is not aware of this diagnosis in the past.  She does have some shortness of breath on exertion.  She denied having any chest pain there is no tightness noted.  The patient also does admit to having snoring more pronounced at nighttime.  She denies having any headaches.  She does feel tired during the daytime and she does feel sleepy during the daytime.  She is never been worked up for obstructive sleep apnea.  She has never had any tests related to overnight oximetry done.  She does not drive and her daughters mainly drive her around so time.  She is never had any issues with falling asleep behind the wheel.  ROS  General: (-) fever, (-) chills, (-) night sweats, (-) weakness Skin: (-) rashes, (-) itching,. Eyes: (-) visual changes, (-) redness, (-) itching. Nose and Sinuses: (-) nasal stuffiness or itchiness, (-) postnasal drip, (-) nosebleeds, (-) sinus trouble. Mouth and Throat: (-) sore throat, (-) hoarseness. Neck: (-) swollen glands, (-) enlarged thyroid, (-) neck pain. Respiratory: - cough, (-) bloody sputum, + shortness of breath, - wheezing. Cardiovascular: - ankle swelling, (-) chest pain. Lymphatic: (-) lymph node enlargement. Neurologic: (-) numbness, (-) tingling. Psychiatric: (-) anxiety, (-) depression   Current Medication: Outpatient Encounter Medications as of 12/26/2018  Medication Sig  . acetaminophen (TYLENOL) 500 MG tablet Take 1,000 mg by mouth every 6 (six) hours as needed for pain.  Marland Kitchen albuterol (PROVENTIL HFA;VENTOLIN HFA) 108 (90 Base)  MCG/ACT inhaler Inhale 1-2 puffs into the lungs every 6 (six) hours as needed for wheezing or shortness of breath.  . ALPRAZolam (XANAX) 0.25 MG tablet Take 1 tablet po QD PRN  . ibuprofen (ADVIL) 600 MG tablet Take 1 tablet (600 mg total) by mouth every 6 (six) hours as needed.  Marland Kitchen levonorgestrel (MIRENA) 20 MCG/24HR IUD 20 mcg by Intrauterine route.  Marland Kitchen losartan (COZAAR) 25 MG tablet Take 2 tablets (50 mg total) by mouth daily.  . meclizine (ANTIVERT) 25 MG tablet Take 1 tablet (25 mg total) by mouth 3 (three) times daily as needed for dizziness.  . montelukast (SINGULAIR) 10 MG tablet Take 1 tablet (10 mg total) by mouth at bedtime.  . Multiple Vitamin (MULTIVITAMIN) tablet Take 1 tablet by mouth daily.  Marland Kitchen SYNTHROID 25 MCG tablet Take 25 mcg by mouth daily.  Marland Kitchen tiZANidine (ZANAFLEX) 4 MG tablet Take 1 tablet (4 mg total) by mouth every 8 (eight) hours as needed for muscle spasms.   No facility-administered encounter medications on file as of 12/26/2018.     Surgical History: Past Surgical History:  Procedure Laterality Date  . TUBAL LIGATION      Medical History: Past Medical History:  Diagnosis Date  . Anemia   . Arthritis   . Cardiomegaly   . Hypertension     Family History: Family History  Problem Relation Age of Onset  . Cancer Other   . Breast cancer Maternal Grandmother   . Breast cancer Cousin        1st maternal cousin    Social History: Social History  Socioeconomic History  . Marital status: Single    Spouse name: Not on file  . Number of children: Not on file  . Years of education: Not on file  . Highest education level: Not on file  Occupational History  . Not on file  Social Needs  . Financial resource strain: Not on file  . Food insecurity    Worry: Not on file    Inability: Not on file  . Transportation needs    Medical: Not on file    Non-medical: Not on file  Tobacco Use  . Smoking status: Never Smoker  . Smokeless tobacco: Never Used   Substance and Sexual Activity  . Alcohol use: No  . Drug use: No  . Sexual activity: Yes    Birth control/protection: I.U.D.  Lifestyle  . Physical activity    Days per week: Not on file    Minutes per session: Not on file  . Stress: Not on file  Relationships  . Social Herbalist on phone: Not on file    Gets together: Not on file    Attends religious service: Not on file    Active member of club or organization: Not on file    Attends meetings of clubs or organizations: Not on file    Relationship status: Not on file  . Intimate partner violence    Fear of current or ex partner: Not on file    Emotionally abused: Not on file    Physically abused: Not on file    Forced sexual activity: Not on file  Other Topics Concern  . Not on file  Social History Narrative  . Not on file    Vital Signs: Blood pressure 130/90, pulse 74, temperature (!) 97.4 F (36.3 C), resp. rate 16, height 5\' 6"  (1.676 m), weight 284 lb (128.8 kg), SpO2 98 %.  Examination: General Appearance: The patient is well-developed, well-nourished, and in no distress. Skin: Gross inspection of skin unremarkable. Head: normocephalic, no gross deformities. Eyes: no gross deformities noted. ENT: ears appear grossly normal no exudates. Neck: Supple. No thyromegaly. No LAD. Respiratory: No rhonchi no rales are noted at this time. Cardiovascular: Normal S1 and S2 without murmur or rub. Extremities: No cyanosis. pulses are equal. Neurologic: Alert and oriented. No involuntary movements.  LABS: Recent Results (from the past 2160 hour(s))  SARS CORONAVIRUS 2 (TAT 6-24 HRS) Nasopharyngeal Nasopharyngeal Swab     Status: None   Collection Time: 12/13/18  8:18 AM   Specimen: Nasopharyngeal Swab  Result Value Ref Range   SARS Coronavirus 2 NEGATIVE NEGATIVE    Comment: (NOTE) SARS-CoV-2 target nucleic acids are NOT DETECTED. The SARS-CoV-2 RNA is generally detectable in upper and lower respiratory  specimens during the acute phase of infection. Negative results do not preclude SARS-CoV-2 infection, do not rule out co-infections with other pathogens, and should not be used as the sole basis for treatment or other patient management decisions. Negative results must be combined with clinical observations, patient history, and epidemiological information. The expected result is Negative. Fact Sheet for Patients: SugarRoll.be Fact Sheet for Healthcare Providers: https://www.woods-mathews.com/ This test is not yet approved or cleared by the [place] FDA and  has been authorized for detection and/or diagnosis of SARS-CoV-2 by FDA under an Emergency Use Authorization (EUA). This EUA will remain  in effect (meaning this test can be used) for the duration of the COVID-19 declaration under Section 56 4(b)(1) of the Act, 21 U.S.C. section 360bbb-3(b)(1),  unless the authorization is terminated or revoked sooner. Performed at [facility], [place] [address], [place], [place] [address]   EXERCISE TOLERANCE TEST (ETT)     Status: None (In process)   Collection Time: 12/14/18  8:39 AM  Result Value Ref Range   Rest HR 78 bpm   Rest BP 136/73 mmHg   Exercise duration (sec) 0 sec   Percent HR 85 %   Exercise duration (min) 4 min   Estimated workload 5.8 METS   Peak HR 139 bpm   Peak BP 155/80 mmHg    Radiology: No results found.  No results found.  No results found.    Assessment and Plan: Patient Active Problem List   Diagnosis Date Noted  . Acute pain of left shoulder 11/23/2018  . Cardiomegaly 11/23/2018  . Abnormal ECG 11/23/2018  . Acquired hypothyroidism 05/22/2018  . Seasonal allergies 05/22/2018  . Acute anxiety 05/22/2018  . Dysuria 05/22/2018  . Hypertension 01/05/2018  . Routine cervical smear 01/05/2018  . Elevated ferritin 11/26/2015  . Thrombocytosis ([place]) 11/26/2015    1. Pulmonary hypertension moderate  disease based on the echocardiogram I do not think we need to do a cardiac catheterization at this time however I would recommend doing a sleep study on her for evaluation of obstructive sleep apnea.  In addition would recommend getting a pulmonary function study done on her to assess for possibility of COPD.  If this work-up is unremarkable then I would consider doing a right nocturnal catheterization.  In addition we will get a 6-minute walk on her to assess hypoxia 2. Hypersomnia possible obstructive sleep apnea patient is morbidly obese she does feel tired and sleepy she does have excessive snoring.  Discussed this with the family going to go ahead and get a sleep study done on her. 3. Snoring as above we will get a sleep study done 4. Shortness of breath in the setting of volume retention on the echocardiogram work-up should be done for COPD we will get a pulmonary function study to assess and will get her followed up after all the testing is done. 5. Morbid obesity will be managed by her primary care team dietary management will continue with supportive care  General Counseling: I have discussed the findings of the evaluation and examination with Sheilah.  I have also discussed any further diagnostic evaluation thatmay be needed or ordered today. Ximenna verbalizes understanding of the findings of todays visit. We also reviewed her medications today and discussed drug interactions and side effects including but not limited excessive drowsiness and altered mental states. We also discussed that there is always a risk not just to her but also people around her. she has been encouraged to call the office with any questions or concerns that should arise related to todays visit.    Time spent: 45 minutes  I have personally obtained a history, examined the patient, evaluated laboratory and imaging results, formulated the assessment and plan and placed orders.    Allyne Gee, MD [facility] Pulmonary and  Critical Care Sleep medicine

## 2019-01-04 DIAGNOSIS — I270 Primary pulmonary hypertension: Secondary | ICD-10-CM | POA: Insufficient documentation

## 2019-01-11 ENCOUNTER — Ambulatory Visit: Payer: 59 | Admitting: Internal Medicine

## 2019-01-23 ENCOUNTER — Telehealth: Payer: Self-pay

## 2019-01-23 NOTE — Telephone Encounter (Signed)
Called confirmed appointment with patient. klh 

## 2019-01-25 ENCOUNTER — Other Ambulatory Visit: Payer: Self-pay

## 2019-01-25 ENCOUNTER — Other Ambulatory Visit: Payer: 59 | Admitting: Internal Medicine

## 2019-01-25 DIAGNOSIS — G4719 Other hypersomnia: Secondary | ICD-10-CM | POA: Diagnosis not present

## 2019-01-30 NOTE — Procedures (Signed)
[facility] [place], [place] [address]  Sleep Specialist: Allyne Gee, MD [place] Sleep Study Interpretation  Patient Name: Shelley [name] Patient MR [id number] DOB:[birth year]  Date of Study: January 25, 2019  Indications for study: Hypersomnia snoring  BMI: 45.8 kg/m       Respiratory Data:  Total AHI: 0.3/h  Total Obstructive Apneas: 1  Total Central Apneas: 0  Total Mixed Apneas: 0  Total Hypopneas: 1  If the AHI is greater than 5 per hour patient qualifies for PAP evaluation  Oximetry Data:  Oxygen Desaturation Index: 0.3/h  Lowest Desaturation: 80%  Cardiac Data:  Minimum Heart Rate: 57  Maximum Heart Rate: 103   Impression / Diagnosis:  This patient does not exhibit significant obstructive sleep apnea however patient does have significant oxygen desaturations with the lowest saturation of 80%.  Consider a search for underlying cardiopulmonary disease as this may be contributing to the patient's desaturations as noted above.  GENERAL Recommendations:  1.  Consider Auto PAP with pressure ranges 5-20 cmH20 with download, or facility based PAP Titration Study  2.  Consider PAP interface mask fitted for patient comfort, Heated Humidification & PAP compliance monitoring (1 month, 3 months & 12 months after PAP initiation)  3. Consider treatment with mandibular advancement splint (MAS) or referral to an ENT surgeon for modification to the upper airway if the patient prefers an alternate therapy or the PAP trial is unsuccessful  4. Sleep hygiene measures should be discussed with the patient  5. Behavioral therapy such as weight reduction or smoking cessation as appropriate for the patient  6. Advise patient against the use of alcohol or sedatives in so much as these substances can worsen excessive daytime sleepiness and respiratory disturbances of sleep  7. Advise patient against participating in potentially  dangerous activities while drowsy such as operating a motor vehicle, heavy equipment or power tools as it can put them and others in danger  8. Advise patient of the long term consequences of OSA if left untreated, need for treatment and close follow up  9. Clinical follow up as deemed necessary     This Level III home sleep study was performed using the [employer], a 4 channel screening device subject to limitations. Depending on actual total sleep time, not measured in this study, the AHI (sum of apneas and hypopneas/hr of sleep) and therefore the severity of sleep apnea may be underestimated. As with any single night study, including Level 1 attended PSG, severity of sleep apnea may also be underestimated due to the lack of supine and/or REM sleep.  The interpretation associated with this report is based on normal values and degrees of severity in accordance with AASM parameters and/or estimated from multiple sources in the literature for adults ages 49-80+. These may not agree with the displayed values. The patient's treating physician should use the interpretation and recommendations in conjunction with the overall clinical evaluation and treatment of the patient.  Some of the terminology used in this scored ApneaLink report was developed several years ago and may not always be in accordance with current nomenclature. This in no way affects the accuracy of the data or the reliability of the interpretation and recommendations.

## 2019-02-06 ENCOUNTER — Telehealth: Payer: Self-pay

## 2019-02-06 NOTE — Telephone Encounter (Signed)
Called lmom informing patient of appointment. klh 

## 2019-02-07 ENCOUNTER — Ambulatory Visit: Payer: 59 | Admitting: Internal Medicine

## 2019-02-08 ENCOUNTER — Telehealth: Payer: Self-pay

## 2019-02-08 ENCOUNTER — Ambulatory Visit: Payer: 59 | Admitting: Internal Medicine

## 2019-02-08 DIAGNOSIS — R0602 Shortness of breath: Secondary | ICD-10-CM

## 2019-02-08 LAB — PULMONARY FUNCTION TEST

## 2019-02-08 NOTE — Telephone Encounter (Signed)
Confirmed appointment with patient. klh °

## 2019-02-09 ENCOUNTER — Telehealth: Payer: Self-pay

## 2019-02-09 NOTE — Telephone Encounter (Signed)
Called lmom informing patient of appointment. klh 

## 2019-02-13 ENCOUNTER — Other Ambulatory Visit: Payer: Self-pay

## 2019-02-13 ENCOUNTER — Ambulatory Visit (INDEPENDENT_AMBULATORY_CARE_PROVIDER_SITE_OTHER): Payer: 59 | Admitting: Internal Medicine

## 2019-02-13 ENCOUNTER — Encounter: Payer: Self-pay | Admitting: Internal Medicine

## 2019-02-13 VITALS — BP 137/88 | HR 82 | Temp 97.1°F | Resp 16 | Ht 66.0 in | Wt 287.0 lb

## 2019-02-13 DIAGNOSIS — I270 Primary pulmonary hypertension: Secondary | ICD-10-CM

## 2019-02-13 DIAGNOSIS — G4719 Other hypersomnia: Secondary | ICD-10-CM

## 2019-02-13 DIAGNOSIS — G4734 Idiopathic sleep related nonobstructive alveolar hypoventilation: Secondary | ICD-10-CM | POA: Diagnosis not present

## 2019-02-13 MED ORDER — ALBUTEROL SULFATE HFA 108 (90 BASE) MCG/ACT IN AERS: 1.0000 | INHALATION_SPRAY | Freq: Four times a day (QID) | RESPIRATORY_TRACT | 2 refills | Status: DC | PRN

## 2019-02-13 NOTE — Progress Notes (Signed)
[facility] [place], [place] [address]  Pulmonary Sleep Medicine   Office Visit Note  Patient Name: Shelley [name] DOB: Apr 27, 1960 MRN [id number]  Date of Service: 02/13/2019  Complaints/HPI: Routine pulmonology follow-up. Reviewing sleep study and recent PFT. Her PFT was normal.  Her sleep study indicates an AHI of 0.3/h which I not indicative of sleep apnea.  She did have some oxygen desaturation down to 80%, so an overnight oximetry would be beneficial to assess for nocturnal hypoxia and the need for nocturnal oxygen.     ROS  General: (-) fever, (-) chills, (-) night sweats, (-) weakness Skin: (-) rashes, (-) itching,. Eyes: (-) visual changes, (-) redness, (-) itching. Nose and Sinuses: (-) nasal stuffiness or itchiness, (-) postnasal drip, (-) nosebleeds, (-) sinus trouble. Mouth and Throat: (-) sore throat, (-) hoarseness. Neck: (-) swollen glands, (-) enlarged thyroid, (-) neck pain. Respiratory: - cough, (-) bloody sputum, - shortness of breath, - wheezing. Cardiovascular: - ankle swelling, (-) chest pain. Lymphatic: (-) lymph node enlargement. Neurologic: (-) numbness, (-) tingling. Psychiatric: (-) anxiety, (-) depression   Current Medication: Outpatient Encounter Medications as of 02/13/2019  Medication Sig  . acetaminophen (TYLENOL) 500 MG tablet Take 1,000 mg by mouth every 6 (six) hours as needed for pain.  Marland Kitchen albuterol (PROVENTIL HFA;VENTOLIN HFA) 108 (90 Base) MCG/ACT inhaler Inhale 1-2 puffs into the lungs every 6 (six) hours as needed for wheezing or shortness of breath.  . ALPRAZolam (XANAX) 0.25 MG tablet Take 1 tablet po QD PRN  . ibuprofen (ADVIL) 600 MG tablet Take 1 tablet (600 mg total) by mouth every 6 (six) hours as needed.  Marland Kitchen levonorgestrel (MIRENA) 20 MCG/24HR IUD 20 mcg by Intrauterine route.  Marland Kitchen losartan (COZAAR) 25 MG tablet Take 2 tablets (50 mg total) by mouth daily.  . meclizine (ANTIVERT) 25 MG tablet Take 1 tablet  (25 mg total) by mouth 3 (three) times daily as needed for dizziness.  . montelukast (SINGULAIR) 10 MG tablet Take 1 tablet (10 mg total) by mouth at bedtime.  . Multiple Vitamin (MULTIVITAMIN) tablet Take 1 tablet by mouth daily.  Marland Kitchen SYNTHROID 25 MCG tablet Take 25 mcg by mouth daily.  Marland Kitchen tiZANidine (ZANAFLEX) 4 MG tablet Take 1 tablet (4 mg total) by mouth every 8 (eight) hours as needed for muscle spasms.   No facility-administered encounter medications on file as of 02/13/2019.     Surgical History: Past Surgical History:  Procedure Laterality Date  . TUBAL LIGATION      Medical History: Past Medical History:  Diagnosis Date  . Anemia   . Arthritis   . Cardiomegaly   . Hypertension     Family History: Family History  Problem Relation Age of Onset  . Cancer Other   . Breast cancer Maternal Grandmother   . Breast cancer Cousin        1st maternal cousin    Social History: Social History   Socioeconomic History  . Marital status: Single    Spouse name: Not on file  . Number of children: Not on file  . Years of education: Not on file  . Highest education level: Not on file  Occupational History  . Not on file  Social Needs  . Financial resource strain: Not on file  . Food insecurity    Worry: Not on file    Inability: Not on file  . Transportation needs    Medical: Not on file    Non-medical:  Not on file  Tobacco Use  . Smoking status: Never Smoker  . Smokeless tobacco: Never Used  Substance and Sexual Activity  . Alcohol use: No  . Drug use: No  . Sexual activity: Yes    Birth control/protection: I.U.D.  Lifestyle  . Physical activity    Days per week: Not on file    Minutes per session: Not on file  . Stress: Not on file  Relationships  . Social Herbalist on phone: Not on file    Gets together: Not on file    Attends religious service: Not on file    Active member of club or organization: Not on file    Attends meetings of clubs or  organizations: Not on file    Relationship status: Not on file  . Intimate partner violence    Fear of current or ex partner: Not on file    Emotionally abused: Not on file    Physically abused: Not on file    Forced sexual activity: Not on file  Other Topics Concern  . Not on file  Social History Narrative  . Not on file    Vital Signs: Blood pressure 137/88, pulse 82, temperature (!) 97.1 F (36.2 C), resp. rate 16, height 5\' 6"  (1.676 m), weight 287 lb (130.2 kg), SpO2 94 %.  Examination: General Appearance: The patient is well-developed, well-nourished, and in no distress. Skin: Gross inspection of skin unremarkable. Head: normocephalic, no gross deformities. Eyes: no gross deformities noted. ENT: ears appear grossly normal no exudates. Neck: Supple. No thyromegaly. No LAD. Respiratory: clear bilaterally. Cardiovascular: Normal S1 and S2 without murmur or rub. Extremities: No cyanosis. pulses are equal. Neurologic: Alert and oriented. No involuntary movements.  LABS: Recent Results (from the past 2160 hour(s))  SARS CORONAVIRUS 2 (TAT 6-24 HRS) Nasopharyngeal Nasopharyngeal Swab     Status: None   Collection Time: 12/13/18  8:18 AM   Specimen: Nasopharyngeal Swab  Result Value Ref Range   SARS Coronavirus 2 NEGATIVE NEGATIVE    Comment: (NOTE) SARS-CoV-2 target nucleic acids are NOT DETECTED. The SARS-CoV-2 RNA is generally detectable in upper and lower respiratory specimens during the acute phase of infection. Negative results do not preclude SARS-CoV-2 infection, do not rule out co-infections with other pathogens, and should not be used as the sole basis for treatment or other patient management decisions. Negative results must be combined with clinical observations, patient history, and epidemiological information. The expected result is Negative. Fact Sheet for Patients: SugarRoll.be Fact Sheet for Healthcare  Providers: https://www.woods-mathews.com/ This test is not yet approved or cleared by the [place] FDA and  has been authorized for detection and/or diagnosis of SARS-CoV-2 by FDA under an Emergency Use Authorization (EUA). This EUA will remain  in effect (meaning this test can be used) for the duration of the COVID-19 declaration under Section 56 4(b)(1) of the Act, 21 U.S.C. section 360bbb-3(b)(1), unless the authorization is terminated or revoked sooner. Performed at [facility], [place] [address], [place], [place] [address]   EXERCISE TOLERANCE TEST (ETT)     Status: None (In process)   Collection Time: 12/14/18  8:39 AM  Result Value Ref Range   Rest HR 78 bpm   Rest BP 136/73 mmHg   Exercise duration (sec) 0 sec   Percent HR 85 %   Exercise duration (min) 4 min   Estimated workload 5.8 METS   Peak HR 139 bpm   Peak BP 155/80 mmHg  Radiology: No results found.  No results found.  No results found.    Assessment and Plan: Patient Active Problem List   Diagnosis Date Noted  . Pulmonary hypertension, primary ([place]) 01/04/2019  . Acute pain of left shoulder 11/23/2018  . Cardiomegaly 11/23/2018  . Abnormal ECG 11/23/2018  . Acquired hypothyroidism 05/22/2018  . Seasonal allergies 05/22/2018  . Acute anxiety 05/22/2018  . Dysuria 05/22/2018  . Hypertension 01/05/2018  . Routine cervical smear 01/05/2018  . Elevated ferritin 11/26/2015  . Thrombocytosis ([place]) 11/26/2015    1. Nocturnal hypoxia Will get overnight oximetry for assessment.   - Pulse oximetry, overnight; Future  2. Other hypersomnia Sleep study not indicative of apnea.  3. Morbid obesity ([place]) Obesity Counseling: Risk Assessment: An assessment of behavioral risk factors was made today and includes lack of exercise sedentary lifestyle, lack of portion control and poor dietary habits.  Risk Modification Advice: She was counseled on portion control guidelines. Restricting  daily caloric intake to 1800. The detrimental long term effects of obesity on her health and ongoing poor compliance was also discussed with the patient.  4. Pulmonary hypertension, primary ([place]) Once overnight oximetry is complete, consider right heart cath.   General Counseling: I have discussed the findings of the evaluation and examination with Arbadella.  I have also discussed any further diagnostic evaluation thatmay be needed or ordered today. Koriana verbalizes understanding of the findings of todays visit. We also reviewed her medications today and discussed drug interactions and side effects including but not limited excessive drowsiness and altered mental states. We also discussed that there is always a risk not just to her but also people around her. she has been encouraged to call the office with any questions or concerns that should arise related to todays visit.  No orders of the defined types were placed in this encounter.    Time spent: 25 This patient was seen by Orson Gear AGNP-C in Collaboration with Dr. Devona Konig as a part of collaborative care agreement.   I have personally obtained a history, examined the patient, evaluated laboratory and imaging results, formulated the assessment and plan and placed orders.    Allyne Gee, MD [facility] Pulmonary and Critical Care Sleep medicine

## 2019-02-19 ENCOUNTER — Emergency Department
Admission: EM | Admit: 2019-02-19 | Discharge: 2019-02-19 | Disposition: A | Discharge status: Home / Self Care | Payer: No Typology Code available for payment source | Attending: Emergency Medicine | Admitting: Emergency Medicine

## 2019-02-19 ENCOUNTER — Other Ambulatory Visit: Payer: Self-pay

## 2019-02-19 ENCOUNTER — Emergency Department: Payer: No Typology Code available for payment source

## 2019-02-19 DIAGNOSIS — D7589 Other specified diseases of blood and blood-forming organs: Secondary | ICD-10-CM | POA: Diagnosis not present

## 2019-02-19 DIAGNOSIS — I10 Essential (primary) hypertension: Secondary | ICD-10-CM | POA: Insufficient documentation

## 2019-02-19 DIAGNOSIS — G510 Bell's palsy: Secondary | ICD-10-CM | POA: Insufficient documentation

## 2019-02-19 DIAGNOSIS — R519 Headache, unspecified: Secondary | ICD-10-CM | POA: Diagnosis present

## 2019-02-19 DIAGNOSIS — E039 Hypothyroidism, unspecified: Secondary | ICD-10-CM | POA: Diagnosis not present

## 2019-02-19 DIAGNOSIS — Z79899 Other long term (current) drug therapy: Secondary | ICD-10-CM | POA: Insufficient documentation

## 2019-02-19 LAB — DIFFERENTIAL
Abs Immature Granulocytes: 0.03 10*3/uL (ref 0.00–0.07)
Basophils Absolute: 0.1 10*3/uL (ref 0.0–0.1)
Basophils Relative: 1 %
Eosinophils Absolute: 0.3 10*3/uL (ref 0.0–0.5)
Eosinophils Relative: 3 %
Immature Granulocytes: 0 %
Lymphocytes Relative: 41 %
Lymphs Abs: 4 10*3/uL (ref 0.7–4.0)
Monocytes Absolute: 0.8 10*3/uL (ref 0.1–1.0)
Monocytes Relative: 8 %
Neutro Abs: 4.6 10*3/uL (ref 1.7–7.7)
Neutrophils Relative %: 47 %

## 2019-02-19 LAB — COMPREHENSIVE METABOLIC PANEL
ALT: 17 U/L (ref 0–44)
AST: 18 U/L (ref 15–41)
Albumin: 4.2 g/dL (ref 3.5–5.0)
Alkaline Phosphatase: 43 U/L (ref 38–126)
Anion gap: 7 (ref 5–15)
BUN: 16 mg/dL (ref 6–20)
CO2: 24 mmol/L (ref 22–32)
Calcium: 8.8 mg/dL — ABNORMAL LOW (ref 8.9–10.3)
Chloride: 107 mmol/L (ref 98–111)
Creatinine, Ser: 0.79 mg/dL (ref 0.44–1.00)
GFR calc Af Amer: >60 mL/min (ref >60)
GFR calc non Af Amer: >60 mL/min (ref >60)
Glucose, Bld: 98 mg/dL (ref 70–99)
Potassium: 3.9 mmol/L (ref 3.5–5.1)
Sodium: 138 mmol/L (ref 135–145)
Total Bilirubin: 0.7 mg/dL (ref 0.3–1.2)
Total Protein: 7.4 g/dL (ref 6.5–8.1)

## 2019-02-19 LAB — CBC
HCT: 41.3 % (ref 36.0–46.0)
Hemoglobin: 13.5 g/dL (ref 12.0–15.0)
MCH: 28.5 pg (ref 26.0–34.0)
MCHC: 32.7 g/dL (ref 30.0–36.0)
MCV: 87.1 fL (ref 80.0–100.0)
Platelets: 381 10*3/uL (ref 150–400)
RBC: 4.74 MIL/uL (ref 3.87–5.11)
RDW: 14.3 % (ref 11.5–15.5)
WBC: 9.8 10*3/uL (ref 4.0–10.5)
nRBC: 0 % (ref 0.0–0.2)

## 2019-02-19 LAB — PROTIME-INR
INR: 1 (ref 0.8–1.2)
Prothrombin Time: 12.9 seconds (ref 11.4–15.2)

## 2019-02-19 LAB — APTT: aPTT: 28 seconds (ref 24–36)

## 2019-02-19 MED ORDER — PREDNISONE 20 MG PO TABS
60.0000 mg | ORAL_TABLET | Freq: Once | ORAL | Status: AC
  Administered 2019-02-19: 60 mg via ORAL
  Filled 2019-02-19: qty 3

## 2019-02-19 MED ORDER — SODIUM CHLORIDE 0.9% FLUSH: 3.0000 mL | Freq: Once | INTRAVENOUS | Status: DC

## 2019-02-19 MED ORDER — LORAZEPAM 1 MG PO TABS
1.0000 mg | ORAL_TABLET | Freq: Once | ORAL | Status: AC
  Administered 2019-02-19: 1 mg via ORAL
  Filled 2019-02-19: qty 1

## 2019-02-19 MED ORDER — LORAZEPAM 2 MG/ML IJ SOLN
1.0000 mg | Freq: Once | INTRAMUSCULAR | Status: AC
  Administered 2019-02-19: 1 mg via INTRAVENOUS
  Filled 2019-02-19: qty 1

## 2019-02-19 MED ORDER — PREDNISONE 10 MG PO TABS: 10.0000 mg | ORAL_TABLET | Freq: Every day | ORAL | 0 refills | Status: DC

## 2019-02-19 NOTE — ED Triage Notes (Signed)
C/O right facial droop and headache.  Onset of symptoms this morning.  States awoke this morning at around 0730. C/O slight headache and then noticed 'face twisted' when she looked in the mirror.  Went to bed last night 2200.

## 2019-02-19 NOTE — ED Notes (Signed)
Pt being transported to CT at this time. 

## 2019-02-19 NOTE — ED Provider Notes (Addendum)
[facility] Emergency Department Provider Note   ____________________________________________   First MD Initiated Contact with Patient 02/19/19 1011     (approximate)  I have reviewed the triage vital signs and the nursing notes.   HISTORY  Chief Complaint Headache and Facial Droop    HPI Shelley [name] is a 58 y.o. female noticed at about 830 this morning and her mouth felt funny when she tried to drink.  Had about 9 she looked in the mirror and saw that her face was not moving properly.  She came into the emergency room.  She has a mild left-sided headache but the right side of her face is not working well.  She said several days ago he was told that the left eye it was larger than the right eye the right eye was closing more completely.  Patient does not have any numbness or weakness elsewhere in her body.      Past Medical History:  Diagnosis Date  . Anemia   . Arthritis   . Cardiomegaly   . Hypertension     Patient Active Problem List   Diagnosis Date Noted  . Pulmonary hypertension, primary ([place]) 01/04/2019  . Acute pain of left shoulder 11/23/2018  . Cardiomegaly 11/23/2018  . Abnormal ECG 11/23/2018  . Acquired hypothyroidism 05/22/2018  . Seasonal allergies 05/22/2018  . Acute anxiety 05/22/2018  . Dysuria 05/22/2018  . Hypertension 01/05/2018  . Routine cervical smear 01/05/2018  . Elevated ferritin 11/26/2015  . Thrombocytosis ([place]) 11/26/2015    Past Surgical History:  Procedure Laterality Date  . TUBAL LIGATION      Prior to Admission medications   Medication Sig Start Date End Date Taking? Authorizing Provider  acetaminophen (TYLENOL) 500 MG tablet Take 1,000 mg by mouth every 6 (six) hours as needed for pain.    [provider]  albuterol (VENTOLIN HFA) 108 (90 Base) MCG/ACT inhaler Inhale 1-2 puffs into the lungs every 6 (six) hours as needed for wheezing or shortness of breath. 02/13/19   Kendell Bane,  NP  ALPRAZolam Duanne Moron) 0.25 MG tablet Take 1 tablet po QD PRN 05/09/18   Ronnell Freshwater, NP  ibuprofen (ADVIL) 600 MG tablet Take 1 tablet (600 mg total) by mouth every 6 (six) hours as needed. 11/09/18   Melynda Ripple, MD  levonorgestrel (MIRENA) 20 MCG/24HR IUD 20 mcg by Intrauterine route.    [provider]  losartan (COZAAR) 25 MG tablet Take 2 tablets (50 mg total) by mouth daily. 11/11/18   Ronnell Freshwater, NP  meclizine (ANTIVERT) 25 MG tablet Take 1 tablet (25 mg total) by mouth 3 (three) times daily as needed for dizziness. 12/31/14   Harvest Dark, MD  montelukast (SINGULAIR) 10 MG tablet Take 1 tablet (10 mg total) by mouth at bedtime. 05/09/18   Ronnell Freshwater, NP  Multiple Vitamin (MULTIVITAMIN) tablet Take 1 tablet by mouth daily.    [provider]  predniSONE (DELTASONE) 10 MG tablet Take 1 tablet (10 mg total) by mouth daily. Disregard the computer-generated instructions. Take 6 tablets a day for 6 days then Take 5 tablets a day for 1 day then Take 4 tablets a day for 1 day then Take 3 tablets a day for 1 day then Take 2 tablets a day for 1 day then Take 1 tablet and stop 02/19/19   Nena Polio, MD  SYNTHROID 25 MCG tablet Take 25 mcg by mouth daily. 12/29/17   [provider]  tiZANidine (ZANAFLEX) 4 MG tablet Take 1 tablet (4 mg total) by mouth every 8 (eight) hours as needed for muscle spasms. 11/09/18   Melynda Ripple, MD    Allergies Patient has no known allergies.  Family History  Problem Relation Age of Onset  . Cancer Other   . Breast cancer Maternal Grandmother   . Breast cancer Cousin        1st maternal cousin    Social History Social History   Tobacco Use  . Smoking status: Never Smoker  . Smokeless tobacco: Never Used  Substance Use Topics  . Alcohol use: No  . Drug use: No    Review of Systems  Constitutional: No fever/chills Eyes: No visual changes. ENT: No sore throat. Cardiovascular: Denies chest  pain. Respiratory: Denies shortness of breath. Gastrointestinal: No abdominal pain.  No nausea, no vomiting.  No diarrhea.  No constipation. Genitourinary: Negative for dysuria. Musculoskeletal: Negative for back pain. Skin: Negative for rash. Neurological: Negative for focal weakness   ____________________________________________   PHYSICAL EXAM:  VITAL SIGNS: ED Triage Vitals  Enc Vitals Group     BP 02/19/19 0953 (!) 158/97     Pulse Rate 02/19/19 0953 74     Resp 02/19/19 0953 16     Temp 02/19/19 0953 98.7 F (37.1 C)     Temp Source 02/19/19 0953 Oral     SpO2 02/19/19 0956 99 %     Weight 02/19/19 0953 284 lb (128.8 kg)     Height 02/19/19 0953 5\' 6"  (1.676 m)     Head Circumference --      Peak Flow --      Pain Score 02/19/19 0952 8     Pain Loc --      Pain Edu? --      Excl. in [place]? --     Constitutional: Alert and oriented. Well appearing and in no acute distress. Eyes: Conjunctivae are normal.  Head: Atraumatic. Nose: No congestion/rhinnorhea. Mouth/Throat: Mucous membranes are moist.  Oropharynx non-erythematous. Neck: No stridor.   Cardiovascular: Normal rate, regular rhythm. Grossly normal heart sounds.  Good peripheral circulation. Respiratory: Normal respiratory effort.  No retractions. Lungs CTAB. Gastrointestinal: Soft and nontender. No distention. No abdominal bruits. No CVA tenderness. Musculoskeletal: No lower extremity tenderness nor edema.  Neurologic:  Normal speech and language. No gross focal neurologic deficits are appreciated except for right sided 7th cranial nerve.  Patient has movement of the right side of the face is just less than the left.  There is movement of the forehead but less than the left side.  The right eye is not closing or blinking is often.  This looks like a Bell's palsy but I am not 100% sure.  The forehead is not as involved as the rest of the face. Skin:  Skin is warm, dry and intact. No rash noted. Psychiatric: Mood and  affect are normal. Speech and behavior are normal.  ____________________________________________   LABS (all labs ordered are listed, but only abnormal results are displayed)  Labs Reviewed  COMPREHENSIVE METABOLIC PANEL - Abnormal; Notable for the following components:      Result Value   Calcium 8.8 (*)    All other components within normal limits  PROTIME-INR  APTT  CBC  DIFFERENTIAL  I-STAT CREATININE, ED  CBG MONITORING, ED   ____________________________________________  EKG   ____________________________________________  RADIOLOGY  ED MD interpretation:    Official radiology report(s): CT HEAD WO CONTRAST  Result Date:  02/19/2019 CLINICAL DATA:  C/O right facial droop and headache. Onset of symptoms this morning. States awoke this morning at around 0730. C/O slight headache and then noticed 'face twisted' when she looked in the mirror. Went to bed last night 2200. EXAM: CT HEAD WITHOUT CONTRAST TECHNIQUE: Contiguous axial images were obtained from the base of the skull through the vertex without intravenous contrast. COMPARISON:  None. FINDINGS: Brain: No evidence of acute infarction, hemorrhage, hydrocephalus, extra-axial collection or mass lesion/mass effect. Vascular: No hyperdense vessel or unexpected calcification. Skull: Normal. Negative for fracture or focal lesion. Sinuses/Orbits: . The visualized visualized globes and orbits are unremarkable sinuses and mastoid air cells are clear. Other: None. IMPRESSION: Normal unenhanced CT scan of the brain. Electronically Signed   By: Lajean Manes M.D.   On: 02/19/2019 10:32   MR BRAIN WO CONTRAST  Result Date: 02/19/2019 CLINICAL DATA:  Right facial droop and headache. Rule out Bell's palsy versus stroke EXAM: MRI HEAD WITHOUT CONTRAST TECHNIQUE: Multiplanar, multiecho pulse sequences of the brain and surrounding structures were obtained without intravenous contrast. COMPARISON:  CT head 02/19/2019 FINDINGS: Brain:  Negative for acute infarct. Minimal chronic white matter changes in the subcortical white matter of the frontal lobes bilaterally. Chronic microhemorrhage left parietal lobe and right frontal lobe. No fluid collection. Negative for mass or edema. No midline shift. Vascular: Normal arterial flow voids Skull and upper cervical spine: No focal skull lesion. Sinuses/Orbits: Negative.  Mastoid sinus clear bilaterally. Other: None IMPRESSION: No acute abnormality. Minimal chronic white matter changes. Electronically Signed   By: Franchot Gallo M.D.   On: 02/19/2019 14:39    ____________________________________________   PROCEDURES  Procedure(s) performed (including Critical Care):  Procedures   ____________________________________________   INITIAL IMPRESSION / ASSESSMENT AND PLAN / ED COURSE  I will get an MRI of the brain to make sure that this is not a stroke is very mild.  I think Bell's palsy is the most likely thing but as I noted above I am not sure.    ----------------------------------------- 3:41 PM on 02/19/2019 -----------------------------------------  MRI was read as negative.  We will treat the patient with steroids for her Bell's palsy. We will also have her follow-up with the eye doctor to make sure her eye is doing well and with her regular doctor as well.         ____________________________________________   FINAL CLINICAL IMPRESSION(S) / ED DIAGNOSES  Final diagnoses:  Bell's palsy     ED Discharge Orders         Ordered    predniSONE (DELTASONE) 10 MG tablet  Daily,   Status:  Discontinued     02/19/19 1421    predniSONE (DELTASONE) 10 MG tablet  Daily     02/19/19 1501           Note:  This document was prepared using Dragon voice recognition software and may include unintentional dictation errors.    Nena Polio, MD 02/19/19 1541    Nena Polio, MD 02/19/19 [phone]

## 2019-02-19 NOTE — ED Notes (Signed)
Pt verbalized understanding of discharge instructions. NAD at this time. 

## 2019-02-19 NOTE — Discharge Instructions (Signed)
Please call Dr. Edison Pace the ophthalmologist and schedule appointment with him to follow-up for your Bell's palsy.  Call him on Monday.  When you sleep use the paper tape to tape your eye shut as we showed you.  During the day get some artificial tears and put 3 or 4 drops of artificial tears in your eye every 2 hours or so.  You can use it more often if your eye feels dry.  Return for any eye pain or if you get worse.  Follow-up with your regular doctor in about a week.

## 2019-02-26 NOTE — Procedures (Signed)
[facility] [place], [address]  DATE OF SERVICE: February 08 2019  Complete Pulmonary Function Testing Interpretation:  FINDINGS:  Forced vital capacity is normal.  The FEV1 is normal.  FEV1 FVC ratio is normal.  Postbronchodilator no significant change in the FEV1 clinical improvement may occur in the absence of spirometric improvement.  Total lung capacity is normal.  Residual volume is normal.  Residual volume total capacity ratio is increased.  FRC is normal.  DLCO is normal.  IMPRESSION:  This pulmonary function study is consistent with a normal study.  Allyne Gee, MD [facility] Pulmonary Critical Care Medicine Sleep Medicine

## 2019-03-01 ENCOUNTER — Ambulatory Visit: Payer: 59 | Admitting: Adult Health

## 2019-03-01 ENCOUNTER — Encounter: Payer: Self-pay | Admitting: Adult Health

## 2019-03-01 ENCOUNTER — Other Ambulatory Visit: Payer: Self-pay

## 2019-03-01 ENCOUNTER — Telehealth: Payer: Self-pay

## 2019-03-01 VITALS — BP 143/80 | HR 72 | Resp 16 | Ht 66.0 in | Wt 280.0 lb

## 2019-03-01 DIAGNOSIS — I10 Essential (primary) hypertension: Secondary | ICD-10-CM

## 2019-03-01 DIAGNOSIS — G510 Bell's palsy: Secondary | ICD-10-CM

## 2019-03-01 NOTE — Progress Notes (Signed)
[facility] [place], [place] [address]  Internal MEDICINE  Telephone Visit  Patient Name: Shelley [name]  [id number]  [id number]  Date of Service: 03/01/2019  I connected with the patient at [phone] by telephone and verified the patients identity using two identifiers.   I discussed the limitations, risks, security and privacy concerns of performing an evaluation and management service by telephone and the availability of in person appointments. I also discussed with the patient that there may be a patient responsible charge related to the service.  The patient expressed understanding and agrees to proceed.    Chief Complaint  Patient presents with  . Telephone Screen    hospital follow up   . Telephone Assessment    HPI  Pt is seen via video. She reports about 10 days ago she notice the right side of her face felt different.  She could not close her eye completely, and she noticed a sagging on that side.  She denies any other symptoms, no headache, dizziness or head trauma. She went to the ER where a stroke was ruled out and bells palsy was assumed.  She was placed on steroids and sent home. She was also sent to the eye doctor to monitor her eye on that side, and he gave her some drops that she continues.       Current Medication: Outpatient Encounter Medications as of 03/01/2019  Medication Sig  . acetaminophen (TYLENOL) 500 MG tablet Take 1,000 mg by mouth every 6 (six) hours as needed for pain.  Marland Kitchen albuterol (VENTOLIN HFA) 108 (90 Base) MCG/ACT inhaler Inhale 1-2 puffs into the lungs every 6 (six) hours as needed for wheezing or shortness of breath.  . ALPRAZolam (XANAX) 0.25 MG tablet Take 1 tablet po QD PRN  . ibuprofen (ADVIL) 600 MG tablet Take 1 tablet (600 mg total) by mouth every 6 (six) hours as needed.  Marland Kitchen levonorgestrel (MIRENA) 20 MCG/24HR IUD 20 mcg by Intrauterine route.  Marland Kitchen losartan (COZAAR) 25 MG tablet Take 2 tablets (50 mg total) by mouth  daily.  . meclizine (ANTIVERT) 25 MG tablet Take 1 tablet (25 mg total) by mouth 3 (three) times daily as needed for dizziness.  . montelukast (SINGULAIR) 10 MG tablet Take 1 tablet (10 mg total) by mouth at bedtime.  . Multiple Vitamin (MULTIVITAMIN) tablet Take 1 tablet by mouth daily.  . predniSONE (DELTASONE) 10 MG tablet Take 1 tablet (10 mg total) by mouth daily. Disregard the computer-generated instructions. Take 6 tablets a day for 6 days then Take 5 tablets a day for 1 day then Take 4 tablets a day for 1 day then Take 3 tablets a day for 1 day then Take 2 tablets a day for 1 day then Take 1 tablet and stop  . SYNTHROID 25 MCG tablet Take 25 mcg by mouth daily.  Marland Kitchen tiZANidine (ZANAFLEX) 4 MG tablet Take 1 tablet (4 mg total) by mouth every 8 (eight) hours as needed for muscle spasms.   No facility-administered encounter medications on file as of 03/01/2019.    Surgical History: Past Surgical History:  Procedure Laterality Date  . TUBAL LIGATION      Medical History: Past Medical History:  Diagnosis Date  . Anemia   . Arthritis   . Cardiomegaly   . Hypertension     Family History: Family History  Problem Relation Age of Onset  . Cancer Other   . Breast cancer Maternal Grandmother   . Breast  cancer Cousin        1st maternal cousin    Social History   Socioeconomic History  . Marital status: Single    Spouse name: Not on file  . Number of children: Not on file  . Years of education: Not on file  . Highest education level: Not on file  Occupational History  . Not on file  Tobacco Use  . Smoking status: Never Smoker  . Smokeless tobacco: Never Used  Substance and Sexual Activity  . Alcohol use: No  . Drug use: No  . Sexual activity: Yes    Birth control/protection: I.U.D.  Other Topics Concern  . Not on file  Social History Narrative  . Not on file   Social Determinants of Health   Financial Resource Strain:   . Difficulty of Paying Living  Expenses: Not on file  Food Insecurity:   . Worried About Charity fundraiser in the Last Year: Not on file  . Ran Out of Food in the Last Year: Not on file  Transportation Needs:   . Lack of Transportation (Medical): Not on file  . Lack of Transportation (Non-Medical): Not on file  Physical Activity:   . Days of Exercise per Week: Not on file  . Minutes of Exercise per Session: Not on file  Stress:   . Feeling of Stress : Not on file  Social Connections:   . Frequency of Communication with Friends and Family: Not on file  . Frequency of Social Gatherings with Friends and Family: Not on file  . Attends Religious Services: Not on file  . Active Member of Clubs or Organizations: Not on file  . Attends Archivist Meetings: Not on file  . Marital Status: Not on file  Intimate Partner Violence:   . Fear of Current or Ex-Partner: Not on file  . Emotionally Abused: Not on file  . Physically Abused: Not on file  . Sexually Abused: Not on file      Review of Systems  Constitutional: Negative for chills, fatigue and unexpected weight change.  HENT: Negative for congestion, rhinorrhea, sneezing and sore throat.   Eyes: Negative for photophobia, pain and redness.  Respiratory: Negative for cough, chest tightness and shortness of breath.   Cardiovascular: Negative for chest pain and palpitations.  Gastrointestinal: Negative for abdominal pain, constipation, diarrhea, nausea and vomiting.  Endocrine: Negative.   Genitourinary: Negative for dysuria and frequency.  Musculoskeletal: Negative for arthralgias, back pain, joint swelling and neck pain.  Skin: Negative for rash.  Allergic/Immunologic: Negative.   Neurological: Negative for tremors and numbness.  Hematological: Negative for adenopathy. Does not bruise/bleed easily.  Psychiatric/Behavioral: Negative for behavioral problems and sleep disturbance. The patient is not nervous/anxious.     Vital Signs: BP (!) 143/80    Pulse 72   Resp 16   Ht 5\' 6"  (1.676 m)   Wt 280 lb (127 kg)   BMI 45.19 kg/m    Observation/Objective:  Well appearing, NAD noted.    Assessment/Plan: 1. Bell's palsy Resolved at this time.   2. Essential hypertension Stable, slightly elevated today continue to monitor.  3. Morbid obesity ([place]) Obesity Counseling: Risk Assessment: An assessment of behavioral risk factors was made today and includes lack of exercise sedentary lifestyle, lack of portion control and poor dietary habits.  Risk Modification Advice: She was counseled on portion control guidelines. Restricting daily caloric intake to 1800. The detrimental long term effects of obesity on her health and ongoing  poor compliance was also discussed with the patient.    General Counseling: Cheyna verbalizes understanding of the findings of today's phone visit and agrees with plan of treatment. I have discussed any further diagnostic evaluation that may be needed or ordered today. We also reviewed her medications today. she has been encouraged to call the office with any questions or concerns that should arise related to todays visit.    No orders of the defined types were placed in this encounter.   No orders of the defined types were placed in this encounter.   Time spent: [place] AGNP-C Internal medicine

## 2019-03-01 NOTE — Telephone Encounter (Signed)
Faxed pt short term disability paperwork and put copy in scan, [facility]

## 2019-03-13 ENCOUNTER — Encounter: Payer: Self-pay | Admitting: Internal Medicine

## 2019-03-13 ENCOUNTER — Other Ambulatory Visit: Payer: Self-pay

## 2019-03-13 ENCOUNTER — Ambulatory Visit: Payer: 59 | Admitting: Internal Medicine

## 2019-03-13 VITALS — BP 127/69 | HR 85 | Ht 66.0 in

## 2019-03-13 DIAGNOSIS — I10 Essential (primary) hypertension: Secondary | ICD-10-CM

## 2019-03-13 DIAGNOSIS — I270 Primary pulmonary hypertension: Secondary | ICD-10-CM | POA: Diagnosis not present

## 2019-03-13 DIAGNOSIS — G4734 Idiopathic sleep related nonobstructive alveolar hypoventilation: Secondary | ICD-10-CM

## 2019-03-13 NOTE — Progress Notes (Signed)
[facility] [place], [place] [address]  Internal MEDICINE  Telephone Visit  Patient Name: Shelley [name]  [id number]  [id number]  Date of Service: 03/13/2019  I connected with the patient at 1149 by telephone and verified the patients identity using two identifiers.   I discussed the limitations, risks, security and privacy concerns of performing an evaluation and management service by telephone and the availability of in person appointments. I also discussed with the patient that there may be a patient responsible charge related to the service.  The patient expressed understanding and agrees to proceed.    Chief Complaint  Patient presents with  . Telephone Assessment  . Telephone Screen  . Follow-up    overnight oximetry results    HPI  Pt is seen via telephone for follow up on overnight oximetry. Her overnight shows a total of 24 minutes below 88%.  She had a low spo2 of 70%.  She did spend a consecutive time of 3.8 minutes below 88% as well. She had 291 desaturations.  The total time for the study was 5 hours and 20 minutes. She will benefit from overnight oxygen at this time.    Current Medication: Outpatient Encounter Medications as of 03/13/2019  Medication Sig  . acetaminophen (TYLENOL) 500 MG tablet Take 1,000 mg by mouth every 6 (six) hours as needed for pain.  Marland Kitchen albuterol (VENTOLIN HFA) 108 (90 Base) MCG/ACT inhaler Inhale 1-2 puffs into the lungs every 6 (six) hours as needed for wheezing or shortness of breath.  . ALPRAZolam (XANAX) 0.25 MG tablet Take 1 tablet po QD PRN  . ibuprofen (ADVIL) 600 MG tablet Take 1 tablet (600 mg total) by mouth every 6 (six) hours as needed.  Marland Kitchen levonorgestrel (MIRENA) 20 MCG/24HR IUD 20 mcg by Intrauterine route.  Marland Kitchen losartan (COZAAR) 25 MG tablet Take 2 tablets (50 mg total) by mouth daily.  . meclizine (ANTIVERT) 25 MG tablet Take 1 tablet (25 mg total) by mouth 3 (three) times daily as needed for dizziness.   . montelukast (SINGULAIR) 10 MG tablet Take 1 tablet (10 mg total) by mouth at bedtime.  . Multiple Vitamin (MULTIVITAMIN) tablet Take 1 tablet by mouth daily.  . predniSONE (DELTASONE) 10 MG tablet Take 1 tablet (10 mg total) by mouth daily. Disregard the computer-generated instructions. Take 6 tablets a day for 6 days then Take 5 tablets a day for 1 day then Take 4 tablets a day for 1 day then Take 3 tablets a day for 1 day then Take 2 tablets a day for 1 day then Take 1 tablet and stop  . SYNTHROID 25 MCG tablet Take 25 mcg by mouth daily.  Marland Kitchen tiZANidine (ZANAFLEX) 4 MG tablet Take 1 tablet (4 mg total) by mouth every 8 (eight) hours as needed for muscle spasms.   No facility-administered encounter medications on file as of 03/13/2019.    Surgical History: Past Surgical History:  Procedure Laterality Date  . TUBAL LIGATION      Medical History: Past Medical History:  Diagnosis Date  . Anemia   . Arthritis   . Cardiomegaly   . Hypertension     Family History: Family History  Problem Relation Age of Onset  . Cancer Other   . Breast cancer Maternal Grandmother   . Breast cancer Cousin        1st maternal cousin    Social History   Socioeconomic History  . Marital status: Single    Spouse  name: Not on file  . Number of children: Not on file  . Years of education: Not on file  . Highest education level: Not on file  Occupational History  . Not on file  Tobacco Use  . Smoking status: Never Smoker  . Smokeless tobacco: Never Used  Substance and Sexual Activity  . Alcohol use: No  . Drug use: No  . Sexual activity: Yes    Birth control/protection: I.U.D.  Other Topics Concern  . Not on file  Social History Narrative  . Not on file   Social Determinants of Health   Financial Resource Strain:   . Difficulty of Paying Living Expenses: Not on file  Food Insecurity:   . Worried About Charity fundraiser in the Last Year: Not on file  . Ran Out of Food in the  Last Year: Not on file  Transportation Needs:   . Lack of Transportation (Medical): Not on file  . Lack of Transportation (Non-Medical): Not on file  Physical Activity:   . Days of Exercise per Week: Not on file  . Minutes of Exercise per Session: Not on file  Stress:   . Feeling of Stress : Not on file  Social Connections:   . Frequency of Communication with Friends and Family: Not on file  . Frequency of Social Gatherings with Friends and Family: Not on file  . Attends Religious Services: Not on file  . Active Member of Clubs or Organizations: Not on file  . Attends Archivist Meetings: Not on file  . Marital Status: Not on file  Intimate Partner Violence:   . Fear of Current or Ex-Partner: Not on file  . Emotionally Abused: Not on file  . Physically Abused: Not on file  . Sexually Abused: Not on file      Review of Systems  Constitutional: Negative for chills, fatigue and unexpected weight change.  HENT: Negative for congestion, rhinorrhea, sneezing and sore throat.   Eyes: Negative for photophobia, pain and redness.  Respiratory: Negative for cough, chest tightness and shortness of breath.   Cardiovascular: Negative for chest pain and palpitations.  Gastrointestinal: Negative for abdominal pain, constipation, diarrhea, nausea and vomiting.  Endocrine: Negative.   Genitourinary: Negative for dysuria and frequency.  Musculoskeletal: Negative for arthralgias, back pain, joint swelling and neck pain.  Skin: Negative for rash.  Allergic/Immunologic: Negative.   Neurological: Negative for tremors and numbness.  Hematological: Negative for adenopathy. Does not bruise/bleed easily.  Psychiatric/Behavioral: Negative for behavioral problems and sleep disturbance. The patient is not nervous/anxious.     Vital Signs: BP 127/69   Pulse 85   Ht 5\' 6"  (1.676 m)   BMI 45.19 kg/m    Observation/Objective:  Well sounding,NAD noted.    Assessment/Plan: 1. Nocturnal  hypoxia Use oxygen at night as directed.  Ordered today. - For home use only DME oxygen  2. Essential hypertension Controlled,continue present mgmt  3. Morbid obesity (HCC) Obesity Counseling: Risk Assessment: An assessment of behavioral risk factors was made today and includes lack of exercise sedentary lifestyle, lack of portion control and poor dietary habits.  Risk Modification Advice: She was counseled on portion control guidelines. Restricting daily caloric intake to 1800. The detrimental long term effects of obesity on her health and ongoing poor compliance was also discussed with the patient.   4. Pulmonary hypertension, primary [facility]) Discuss Cardiology referral and possible right heart cath at next in person visit.   General Counseling: Glennys verbalizes understanding of  the findings of today's phone visit and agrees with plan of treatment. I have discussed any further diagnostic evaluation that may be needed or ordered today. We also reviewed her medications today. she has been encouraged to call the office with any questions or concerns that should arise related to todays visit.    Orders Placed This Encounter  Procedures  . For home use only DME oxygen    No orders of the defined types were placed in this encounter.   Time spent: 15 Minutes    Orson Gear AGNP-C Pulmonary Medicine.

## 2019-03-15 ENCOUNTER — Telehealth: Payer: Self-pay

## 2019-03-15 NOTE — Telephone Encounter (Signed)
Patient has been set up on oxygen with [facility]. Shelley [name]

## 2019-03-20 ENCOUNTER — Ambulatory Visit: Payer: Managed Care, Other (non HMO) | Admitting: Internal Medicine

## 2019-05-11 ENCOUNTER — Ambulatory Visit: Payer: 59 | Admitting: Nurse Practitioner

## 2019-05-11 ENCOUNTER — Telehealth: Payer: Self-pay

## 2019-05-11 NOTE — Telephone Encounter (Signed)
LMOM TO CONFIRM AND SCREEN FOR 05-15-19 OV.

## 2019-05-15 ENCOUNTER — Encounter: Payer: Self-pay | Admitting: Nurse Practitioner

## 2019-05-16 ENCOUNTER — Telehealth: Payer: Self-pay

## 2019-05-16 NOTE — Telephone Encounter (Signed)
Called lmom informing patient of appointment on 05/18/2019. klh

## 2019-05-18 ENCOUNTER — Encounter: Payer: Self-pay | Admitting: Internal Medicine

## 2019-05-18 ENCOUNTER — Ambulatory Visit: Payer: 59 | Admitting: Internal Medicine

## 2019-05-18 ENCOUNTER — Other Ambulatory Visit: Payer: Self-pay

## 2019-05-18 VITALS — BP 127/86 | HR 82 | Temp 97.4°F | Resp 16 | Ht 66.0 in | Wt 290.0 lb

## 2019-05-18 DIAGNOSIS — R9431 Abnormal electrocardiogram [ECG] [EKG]: Secondary | ICD-10-CM | POA: Diagnosis not present

## 2019-05-18 DIAGNOSIS — G4734 Idiopathic sleep related nonobstructive alveolar hypoventilation: Secondary | ICD-10-CM | POA: Diagnosis not present

## 2019-05-18 DIAGNOSIS — I10 Essential (primary) hypertension: Secondary | ICD-10-CM | POA: Diagnosis not present

## 2019-05-18 DIAGNOSIS — I270 Primary pulmonary hypertension: Secondary | ICD-10-CM

## 2019-05-18 DIAGNOSIS — Z6841 Body Mass Index (BMI) 40.0 and over, adult: Secondary | ICD-10-CM

## 2019-05-18 NOTE — Progress Notes (Signed)
[facility] [place], [place] [address]  Pulmonary Sleep Medicine   Office Visit Note  Patient Name: Shelley [name] DOB: 1960-04-28 MRN [id number]  Date of Service: 05/18/2019  Complaints/HPI: Pt is here for pulmonary follow up.  She has a history of PAH.  She had a non nuclear stress test that was unremarkable.  She continues to have some DOE.  Would consider having her see cardiology for possible heart cath.   ROS  General: (-) fever, (-) chills, (-) night sweats, (-) weakness Skin: (-) rashes, (-) itching,. Eyes: (-) visual changes, (-) redness, (-) itching. Nose and Sinuses: (-) nasal stuffiness or itchiness, (-) postnasal drip, (-) nosebleeds, (-) sinus trouble. Mouth and Throat: (-) sore throat, (-) hoarseness. Neck: (-) swollen glands, (-) enlarged thyroid, (-) neck pain. Respiratory: - cough, (-) bloody sputum, - shortness of breath, - wheezing. Cardiovascular: - ankle swelling, (-) chest pain. Lymphatic: (-) lymph node enlargement. Neurologic: (-) numbness, (-) tingling. Psychiatric: (-) anxiety, (-) depression   Current Medication: Outpatient Encounter Medications as of 05/18/2019  Medication Sig  . acetaminophen (TYLENOL) 500 MG tablet Take 1,000 mg by mouth every 6 (six) hours as needed for pain.  Marland Kitchen albuterol (VENTOLIN HFA) 108 (90 Base) MCG/ACT inhaler Inhale 1-2 puffs into the lungs every 6 (six) hours as needed for wheezing or shortness of breath.  . ALPRAZolam (XANAX) 0.25 MG tablet Take 1 tablet po QD PRN  . ibuprofen (ADVIL) 600 MG tablet Take 1 tablet (600 mg total) by mouth every 6 (six) hours as needed.  Marland Kitchen levonorgestrel (MIRENA) 20 MCG/24HR IUD 20 mcg by Intrauterine route.  Marland Kitchen losartan (COZAAR) 25 MG tablet Take 2 tablets (50 mg total) by mouth daily.  . meclizine (ANTIVERT) 25 MG tablet Take 1 tablet (25 mg total) by mouth 3 (three) times daily as needed for dizziness.  . montelukast (SINGULAIR) 10 MG tablet Take 1 tablet (10  mg total) by mouth at bedtime.  . Multiple Vitamin (MULTIVITAMIN) tablet Take 1 tablet by mouth daily.  . predniSONE (DELTASONE) 10 MG tablet Take 1 tablet (10 mg total) by mouth daily. Disregard the computer-generated instructions. Take 6 tablets a day for 6 days then Take 5 tablets a day for 1 day then Take 4 tablets a day for 1 day then Take 3 tablets a day for 1 day then Take 2 tablets a day for 1 day then Take 1 tablet and stop  . SYNTHROID 25 MCG tablet Take 25 mcg by mouth daily.  Marland Kitchen tiZANidine (ZANAFLEX) 4 MG tablet Take 1 tablet (4 mg total) by mouth every 8 (eight) hours as needed for muscle spasms.   No facility-administered encounter medications on file as of 05/18/2019.    Surgical History: Past Surgical History:  Procedure Laterality Date  . TUBAL LIGATION      Medical History: Past Medical History:  Diagnosis Date  . Anemia   . Arthritis   . Cardiomegaly   . Hypertension     Family History: Family History  Problem Relation Age of Onset  . Cancer Other   . Breast cancer Maternal Grandmother   . Breast cancer Cousin        1st maternal cousin    Social History: Social History   Socioeconomic History  . Marital status: Single    Spouse name: Not on file  . Number of children: Not on file  . Years of education: Not on file  . Highest education level: Not on file  Occupational History  . Not on file  Tobacco Use  . Smoking status: Never Smoker  . Smokeless tobacco: Never Used  Substance and Sexual Activity  . Alcohol use: No  . Drug use: No  . Sexual activity: Yes    Birth control/protection: I.U.D.  Other Topics Concern  . Not on file  Social History Narrative  . Not on file   Social Determinants of Health   Financial Resource Strain:   . Difficulty of Paying Living Expenses:   Food Insecurity:   . Worried About Charity fundraiser in the Last Year:   . Arboriculturist in the Last Year:   Transportation Needs:   . Film/video editor  (Medical):   Marland Kitchen Lack of Transportation (Non-Medical):   Physical Activity:   . Days of Exercise per Week:   . Minutes of Exercise per Session:   Stress:   . Feeling of Stress :   Social Connections:   . Frequency of Communication with Friends and Family:   . Frequency of Social Gatherings with Friends and Family:   . Attends Religious Services:   . Active Member of Clubs or Organizations:   . Attends Archivist Meetings:   Marland Kitchen Marital Status:   Intimate Partner Violence:   . Fear of Current or Ex-Partner:   . Emotionally Abused:   Marland Kitchen Physically Abused:   . Sexually Abused:     Vital Signs: Blood pressure 127/86, pulse 82, temperature (!) 97.4 F (36.3 C), resp. rate 16, height 5\' 6"  (1.676 m), weight 290 lb (131.5 kg), SpO2 96 %.  Examination: General Appearance: The patient is well-developed, well-nourished, and in no distress. Skin: Gross inspection of skin unremarkable. Head: normocephalic, no gross deformities. Eyes: no gross deformities noted. ENT: ears appear grossly normal no exudates. Neck: Supple. No thyromegaly. No LAD. Respiratory: clear bilaterally. Cardiovascular: Normal S1 and S2 without murmur or rub. Extremities: No cyanosis. pulses are equal. Neurologic: Alert and oriented. No involuntary movements.  LABS: Recent Results (from the past 2160 hour(s))  Protime-INR     Status: None   Collection Time: 02/19/19 10:13 AM  Result Value Ref Range   Prothrombin Time 12.9 11.4 - 15.2 seconds   INR 1.0 0.8 - 1.2    Comment: (NOTE) INR goal varies based on device and disease states. Performed at [facility], [address] [place], [place] [address]   APTT     Status: None   Collection Time: 02/19/19 10:13 AM  Result Value Ref Range   aPTT 28 24 - 36 seconds    Comment: Performed at [facility], [address] [place], [place] [address]  CBC     Status: None   Collection Time: 02/19/19 10:13 AM  Result Value Ref Range   WBC  9.8 4.0 - 10.5 K/uL   RBC 4.74 3.87 - 5.11 MIL/uL   Hemoglobin 13.5 12.0 - 15.0 g/dL   HCT 41.3 36.0 - 46.0 %   MCV 87.1 80.0 - 100.0 fL   MCH 28.5 26.0 - 34.0 pg   MCHC 32.7 30.0 - 36.0 g/dL   RDW 14.3 11.5 - 15.5 %   Platelets 381 150 - 400 K/uL   nRBC 0.0 0.0 - 0.2 %    Comment: Performed at [facility], [address], [place], [place] [address]  Differential     Status: None   Collection Time: 02/19/19 10:13 AM  Result Value Ref Range   Neutrophils Relative %  47 %   Neutro Abs 4.6 1.7 - 7.7 K/uL   Lymphocytes Relative 41 %   Lymphs Abs 4.0 0.7 - 4.0 K/uL   Monocytes Relative 8 %   Monocytes Absolute 0.8 0.1 - 1.0 K/uL   Eosinophils Relative 3 %   Eosinophils Absolute 0.3 0.0 - 0.5 K/uL   Basophils Relative 1 %   Basophils Absolute 0.1 0.0 - 0.1 K/uL   Immature Granulocytes 0 %   Abs Immature Granulocytes 0.03 0.00 - 0.07 K/uL    Comment: Performed at [facility], [address] [place], [place] [address]  Comprehensive metabolic panel     Status: Abnormal   Collection Time: 02/19/19 10:13 AM  Result Value Ref Range   Sodium 138 135 - 145 mmol/L   Potassium 3.9 3.5 - 5.1 mmol/L   Chloride 107 98 - 111 mmol/L   CO2 24 22 - 32 mmol/L   Glucose, Bld 98 70 - 99 mg/dL   BUN 16 6 - 20 mg/dL   Creatinine, Ser 0.79 0.44 - 1.00 mg/dL   Calcium 8.8 (L) 8.9 - 10.3 mg/dL   Total Protein 7.4 6.5 - 8.1 g/dL   Albumin 4.2 3.5 - 5.0 g/dL   AST 18 15 - 41 U/L   ALT 17 0 - 44 U/L   Alkaline Phosphatase 43 38 - 126 U/L   Total Bilirubin 0.7 0.3 - 1.2 mg/dL   GFR calc non Af Amer >60 >60 mL/min   GFR calc Af Amer >60 >60 mL/min   Anion gap 7 5 - 15    Comment: Performed at [facility], [address] [place], [place] [address]    Radiology: CT HEAD WO CONTRAST  Result Date: 02/19/2019 CLINICAL DATA:  C/O right facial droop and headache. Onset of symptoms this morning. States awoke this morning at around 0730. C/O slight headache and then  noticed 'face twisted' when she looked in the mirror. Went to bed last night 2200. EXAM: CT HEAD WITHOUT CONTRAST TECHNIQUE: Contiguous axial images were obtained from the base of the skull through the vertex without intravenous contrast. COMPARISON:  None. FINDINGS: Brain: No evidence of acute infarction, hemorrhage, hydrocephalus, extra-axial collection or mass lesion/mass effect. Vascular: No hyperdense vessel or unexpected calcification. Skull: Normal. Negative for fracture or focal lesion. Sinuses/Orbits: . The visualized visualized globes and orbits are unremarkable sinuses and mastoid air cells are clear. Other: None. IMPRESSION: Normal unenhanced CT scan of the brain. Electronically Signed   By: Lajean Manes M.D.   On: 02/19/2019 10:32   MR BRAIN WO CONTRAST  Result Date: 02/19/2019 CLINICAL DATA:  Right facial droop and headache. Rule out Bell's palsy versus stroke EXAM: MRI HEAD WITHOUT CONTRAST TECHNIQUE: Multiplanar, multiecho pulse sequences of the brain and surrounding structures were obtained without intravenous contrast. COMPARISON:  CT head 02/19/2019 FINDINGS: Brain: Negative for acute infarct. Minimal chronic white matter changes in the subcortical white matter of the frontal lobes bilaterally. Chronic microhemorrhage left parietal lobe and right frontal lobe. No fluid collection. Negative for mass or edema. No midline shift. Vascular: Normal arterial flow voids Skull and upper cervical spine: No focal skull lesion. Sinuses/Orbits: Negative.  Mastoid sinus clear bilaterally. Other: None IMPRESSION: No acute abnormality. Minimal chronic white matter changes. Electronically Signed   By: Franchot Gallo M.D.   On: 02/19/2019 14:39    No results found.  No results found.    Assessment and Plan: Patient Active Problem List   Diagnosis Date Noted  .  Pulmonary hypertension, primary ([facility]) 01/04/2019  . Acute pain of left shoulder 11/23/2018  . Cardiomegaly 11/23/2018  . Abnormal ECG  11/23/2018  . Acquired hypothyroidism 05/22/2018  . Seasonal allergies 05/22/2018  . Acute anxiety 05/22/2018  . Dysuria 05/22/2018  . Hypertension 01/05/2018  . Routine cervical smear 01/05/2018  . Elevated ferritin 11/26/2015  . Thrombocytosis ([place]) 11/26/2015    1. Pulmonary hypertension, primary [facility]) Referral to cardiology for possible left/right heart cath to eval PAH. - Ambulatory referral to Cardiology  2. Nocturnal hypoxia Continue to use oxygen at night as prescribed.   3. Essential hypertension Controlled, continue previous management.  4. Abnormal ECG AT previous visit, Unremarkable echo.  Referral to Cardiology as above  5. Morbid obesity (HCC) Obesity Counseling: Risk Assessment: An assessment of behavioral risk factors was made today and includes lack of exercise sedentary lifestyle, lack of portion control and poor dietary habits.  Risk Modification Advice: She was counseled on portion control guidelines. Restricting daily caloric intake to 1800. The detrimental long term effects of obesity on her health and ongoing poor compliance was also discussed with the patient.    6. BMI 45.0-49.9, adult (HCC) Excessive BMI in morbid obesity.   General Counseling: I have discussed the findings of the evaluation and examination with Shelley [name].  I have also discussed any further diagnostic evaluation thatmay be needed or ordered today. Shelley [name] verbalizes understanding of the findings of todays visit. We also reviewed her medications today and discussed drug interactions and side effects including but not limited excessive drowsiness and altered mental states. We also discussed that there is always a risk not just to her but also people around her. she has been encouraged to call the office with any questions or concerns that should arise related to todays visit.  No orders of the defined types were placed in this encounter.    Time spent: 30 This patient was seen by Orson Gear AGNP-C in Collaboration with Dr. Devona Konig as a part of collaborative care agreement.   I have personally obtained a history, examined the patient, evaluated laboratory and imaging results, formulated the assessment and plan and placed orders.    Allyne Gee, MD [facility] Pulmonary and Critical Care Sleep medicine

## 2019-05-23 ENCOUNTER — Ambulatory Visit (INDEPENDENT_AMBULATORY_CARE_PROVIDER_SITE_OTHER): Payer: 59 | Admitting: Cardiology

## 2019-05-23 ENCOUNTER — Other Ambulatory Visit
Admission: RE | Admit: 2019-05-23 | Discharge: 2019-05-23 | Disposition: A | Discharge status: Home / Self Care | Payer: 59 | Source: Ambulatory Visit | Attending: Internal Medicine | Admitting: Internal Medicine

## 2019-05-23 ENCOUNTER — Telehealth: Payer: Self-pay | Admitting: Cardiology

## 2019-05-23 ENCOUNTER — Ambulatory Visit: Payer: No Typology Code available for payment source | Admitting: Cardiovascular Disease

## 2019-05-23 ENCOUNTER — Telehealth: Payer: Self-pay

## 2019-05-23 ENCOUNTER — Other Ambulatory Visit: Payer: Self-pay

## 2019-05-23 ENCOUNTER — Encounter: Payer: Self-pay | Admitting: Cardiology

## 2019-05-23 VITALS — BP 124/80 | HR 81 | Ht 66.0 in | Wt 293.5 lb

## 2019-05-23 DIAGNOSIS — I272 Pulmonary hypertension, unspecified: Secondary | ICD-10-CM

## 2019-05-23 DIAGNOSIS — Z20822 Contact with and (suspected) exposure to covid-19: Secondary | ICD-10-CM | POA: Insufficient documentation

## 2019-05-23 DIAGNOSIS — Z01812 Encounter for preprocedural laboratory examination: Secondary | ICD-10-CM | POA: Insufficient documentation

## 2019-05-23 DIAGNOSIS — Z01810 Encounter for preprocedural cardiovascular examination: Secondary | ICD-10-CM | POA: Diagnosis not present

## 2019-05-23 DIAGNOSIS — I10 Essential (primary) hypertension: Secondary | ICD-10-CM | POA: Diagnosis not present

## 2019-05-23 LAB — SARS CORONAVIRUS 2 (TAT 6-24 HRS): SARS Coronavirus 2: NEGATIVE

## 2019-05-23 MED ORDER — SODIUM CHLORIDE 0.9% FLUSH: 3.0000 mL | Freq: Two times a day (BID) | INTRAVENOUS | Status: DC

## 2019-05-23 NOTE — Patient Instructions (Signed)
Medication Instructions:  Your physician recommends that you continue on your current medications as directed. Please refer to the Current Medication list given to you today.  *If you need a refill on your cardiac medications before your next appointment, please call your pharmacy*   Lab Work: 1- Your physician recommends that you return for lab work in: [place], CBC.  2- COVID PRE- TEST: You will need a COVID TEST prior to the procedure:  LOCATION: [place] [facility].  DATE/TIME:  TODAY or TOMORROW 05/23/19 or 05/24/19 between 8:30am to 3 pm.  If you have labs (blood work) drawn today and your tests are completely normal, you will receive your results only by: [facility] MyChart Message (if you have MyChart) OR . A paper copy in the mail If you have any lab test that is abnormal or we need to change your treatment, we will call you to review the results.   Testing/Procedures:  You are scheduled for a RIGHT AND LEFT Cardiac Catheterization on Friday, March 19 with Dr. Harrell Gave End.  1. Please arrive at the [facility] at 6:30 AM (This time is one hours before your procedure to ensure your preparation). Free valet parking service is available.   Special note: Every effort is made to have your procedure done on time. Please understand that emergencies sometimes delay scheduled procedures.  2. Diet: Do not eat solid foods after midnight.  The patient may have clear liquids until 5am upon the day of the procedure.  3. Labs: You will need to have blood drawn on Tuesday, March 16 at [facility], Go to 1st desk on your right to register.  Address: [address], [place] [address]  Open: 8am - 5pm  Phone: [phone]. You do not need to be fasting.  4. Medication instructions in preparation for your procedure:   Contrast Allergy: No  On the morning of your procedure, take your Aspirin 81 mg and any morning medicines NOT listed  above.  You may use sips of water.  5. Plan for one night stay--bring personal belongings. 6. Bring a current list of your medications and current insurance cards. 7. You MUST have a responsible person to drive you home. 8. Someone MUST be with you the first 24 hours after you arrive home or your discharge will be delayed. 9. Please wear clothes that are easy to get on and off and wear slip-on shoes.  Thank you for allowing [place] to care for you!   -- [facility]   Follow-Up: At [facility], you and your health needs are our priority.  As part of our continuing mission to provide you with exceptional heart care, we have created designated Provider Care Teams.  These Care Teams include your primary Cardiologist (physician) and Advanced Practice Providers (APPs -  Physician Assistants and Nurse Practitioners) who all work together to provide you with the care you need, when you need it.  We recommend signing up for the patient portal called "MyChart".  Sign up information is provided on this After Visit Summary.  MyChart is used to connect with patients for Virtual Visits (Telemedicine).  Patients are able to view lab/test results, encounter notes, upcoming appointments, etc.  Non-urgent messages can be sent to your provider as well.   To learn more about what you can do with MyChart, go to [email or web address].    Your next appointment:   2 week(s)  The format for  your next appointment:   In Person  Provider:   Kate Sable, MD    Coronary Angiogram With Stent Coronary angiogram with stent placement is a procedure to widen or open a narrow blood vessel of the heart (coronary artery). Arteries may become blocked by cholesterol buildup (plaques) in the lining of the artery wall. When a coronary artery becomes partially blocked, blood flow to that area decreases. This may lead to chest pain or a heart attack (myocardial infarction). A stent is a  small piece of metal that looks like mesh or spring. Stent placement may be done as treatment after a heart attack, or to prevent a heart attack if a blocked artery is found by a coronary angiogram. Let your health care provider know about:  Any allergies you have, including allergies to medicines or contrast dye.  All medicines you are taking, including vitamins, herbs, eye drops, creams, and over-the-counter medicines.  Any problems you or family members have had with anesthetic medicines.  Any blood disorders you have.  Any surgeries you have had.  Any medical conditions you have, including kidney problems or kidney failure.  Whether you are pregnant or may be pregnant.  Whether you are breastfeeding. What are the risks? Generally, this is a safe procedure. However, serious problems may occur, including:  Damage to nearby structures or organs, such as the heart, blood vessels, or kidneys.  A return of blockage.  Bleeding, infection, or bruising at the insertion site.  A collection of blood under the skin (hematoma) at the insertion site.  A blood clot in another part of the body.  Allergic reaction to medicines or dyes.  Bleeding into the abdomen (retroperitoneal bleeding).  Stroke (rare).  Heart attack (rare). What happens before the procedure? Staying hydrated Follow instructions from your health care provider about hydration, which may include:  Up to 2 hours before the procedure - you may continue to drink clear liquids, such as water, clear fruit juice, black coffee, and plain tea.  Eating and drinking restrictions Follow instructions from your health care provider about eating and drinking, which may include:  8 hours before the procedure - stop eating heavy meals or foods, such as meat, fried foods, or fatty foods.  6 hours before the procedure - stop eating light meals or foods, such as toast or cereal.  2 hours before the procedure - stop drinking clear  liquids. Medicines Ask your health care provider about:  Changing or stopping your regular medicines. This is especially important if you are taking diabetes medicines or blood thinners.  Taking medicines such as aspirin and ibuprofen. These medicines can thin your blood. Do not take these medicines unless your health care provider tells you to take them. ? Generally, aspirin is recommended before a thin tube, called a catheter, is passed through a blood vessel and inserted into the heart (cardiac catheterization).  Taking over-the-counter medicines, vitamins, herbs, and supplements. General instructions  Do not use any products that contain nicotine or tobacco for at least 4 weeks before the procedure. These products include cigarettes, e-cigarettes, and chewing tobacco. If you need help quitting, ask your health care provider.  Plan to have someone take you home from the hospital or clinic.  If you will be going home right after the procedure, plan to have someone with you for 24 hours.  You may have tests and imaging procedures.  Ask your health care provider: ? How your insertion site will be marked. Ask which artery will  be used for the procedure. ? What steps will be taken to help prevent infection. These may include:  Removing hair at the insertion site.  Washing skin with a germ-killing soap.  Taking antibiotic medicine. What happens during the procedure?   An IV will be inserted into one of your veins.  Electrodes may be placed on your chest to monitor your heart rate during the procedure.  You will be given one or more of the following: ? A medicine to help you relax (sedative). ? A medicine to numb the area (local anesthetic) for catheter insertion.  A small incision will be made for catheter insertion.  The catheter will be inserted into an artery using a guide wire. The location may be in your groin, your wrist, or the fold of your arm (near your elbow).  An  X-ray procedure (fluoroscopy) will be used to help guide the catheter to the opening of the heart arteries.  A dye will be injected into the catheter. X-rays will be taken. The dye helps to show where any narrowing or blockages are located in the arteries.  Tell your health care provider if you have chest pain or trouble breathing.  A tiny wire will be guided to the blocked spot, and a balloon will be inflated to make the artery wider.  The stent will be expanded to crush the plaques into the wall of the vessel. The stent will hold the area open and improve the blood flow. Most stents have a drug coating to reduce the risk of the stent narrowing over time.  The artery may be made wider using a drill, laser, or other tools that remove plaques.  The catheter will be removed when the blood flow improves. The stent will stay where it was placed, and the lining of the artery will grow over it.  A bandage (dressing) will be placed on the insertion site. Pressure will be applied to stop bleeding.  The IV will be removed. This procedure may vary among health care providers and hospitals. What happens after the procedure?  Your blood pressure, heart rate, breathing rate, and blood oxygen level will be monitored until you leave the hospital or clinic.  If the procedure is done through the leg, you will lie flat in bed for a few hours or for as long as told by your health care provider. You will be instructed not to bend or cross your legs.  The insertion site and the pulse in your foot or wrist will be checked often.  You may have more blood tests, X-rays, and a test that records the electrical activity of your heart (electrocardiogram, or ECG).  Do not drive for 24 hours if you were given a sedative during your procedure. Summary  Coronary angiogram with stent placement is a procedure to widen or open a narrowed coronary artery. This is done to treat heart problems.  Before the procedure, let  your health care provider know about all the medical conditions and surgeries you have or have had.  This is a safe procedure. However, some problems may occur, including damage to nearby structures or organs, bleeding, blood clots, or allergies.  Follow your health care provider's instructions about eating, drinking, medicines, and other lifestyle changes, such as quitting tobacco use before the procedure. This information is not intended to replace advice given to you by your health care provider. Make sure you discuss any questions you have with your health care provider. Document Revised: 09/14/2018 Document Reviewed:  09/14/2018 Elsevier Patient Education  [place].

## 2019-05-23 NOTE — Telephone Encounter (Signed)
CMN SIGNED AND PLACED IN [place] FOLDER. °

## 2019-05-23 NOTE — Telephone Encounter (Signed)
Attempted to schedule.  LMOV to call office.   Patient appt change to 4/1 with [facility]

## 2019-05-23 NOTE — H&P (View-Only) (Signed)
Cardiology Office Note:    Date:  05/23/2019   ID:  Shelley [name], DOB 10-Mar-1960, MRN [id number]  PCP:  Ronnell Freshwater, NP  Cardiologist:  Kate Sable, MD  Electrophysiologist:  None   Referring MD: Kendell Bane, NP   Chief Complaint  Patient presents with  . other    Pulmonary Hypertension. Meds reviewed verbally with pt.   Shelley [name] is a 59 y.o. female who is being seen today for the evaluation of pulmonary hypertension at the request of Versie Starks, Audie Clear, NP.   History of Present Illness:    Shelley [name] is a 59 y.o. female with a hx of hypertension, obesity who is being seen due to elevated pulmonary pressures.  Patient had an echocardiogram due to cardiomegaly that was found on previous imaging.  Outside echo date 11/25/2018 (report scanned in epic) briefly showed normal LVEF with EF [id number], normal diastolic function, RVSP of 54 (assumed RA pressure of 10), mild TR.  She states exercise treadmill stress test was normal.  Patient states having dyspnea on exertion for the past year now which is worsened.  She also endorses snoring when she sleeps.  Had a sleep study which showed hypoxia during sleep.  She was started on oxygen by pulmonary medicine.  Denies any history of heart disease, takes blood pressure meds as prescribed.   Past Medical History:  Diagnosis Date  . Anemia   . Arthritis   . Cardiomegaly   . Hypertension   . Pulmonary hypertension ([place])   . Thyroid disease     Past Surgical History:  Procedure Laterality Date  . TUBAL LIGATION      Current Medications: Current Meds  Medication Sig  . acetaminophen (TYLENOL) 500 MG tablet Take 1,000 mg by mouth every 6 (six) hours as needed for pain.  Marland Kitchen albuterol (VENTOLIN HFA) 108 (90 Base) MCG/ACT inhaler Inhale 1-2 puffs into the lungs every 6 (six) hours as needed for wheezing or shortness of breath.  . ALPRAZolam (XANAX) 0.25 MG tablet Take 1 tablet po QD PRN  . ibuprofen (ADVIL)  600 MG tablet Take 1 tablet (600 mg total) by mouth every 6 (six) hours as needed.  Marland Kitchen levonorgestrel (MIRENA) 20 MCG/24HR IUD 20 mcg by Intrauterine route.  Marland Kitchen losartan (COZAAR) 25 MG tablet Take 2 tablets (50 mg total) by mouth daily.  . montelukast (SINGULAIR) 10 MG tablet Take 1 tablet (10 mg total) by mouth at bedtime.  . Multiple Vitamin (MULTIVITAMIN) tablet Take 1 tablet by mouth daily.  Marland Kitchen SYNTHROID 25 MCG tablet Take 25 mcg by mouth daily.  Marland Kitchen tiZANidine (ZANAFLEX) 4 MG tablet Take 1 tablet (4 mg total) by mouth every 8 (eight) hours as needed for muscle spasms.     Allergies:   Amlodipine   Social History   Socioeconomic History  . Marital status: Single    Spouse name: Not on file  . Number of children: Not on file  . Years of education: Not on file  . Highest education level: Not on file  Occupational History  . Not on file  Tobacco Use  . Smoking status: Never Smoker  . Smokeless tobacco: Never Used  Substance and Sexual Activity  . Alcohol use: No  . Drug use: No  . Sexual activity: Yes    Birth control/protection: I.U.D.  Other Topics Concern  . Not on file  Social History Narrative  . Not on file   Social Determinants of Health   Financial  Resource Strain:   . Difficulty of Paying Living Expenses:   Food Insecurity:   . Worried About Charity fundraiser in the Last Year:   . Arboriculturist in the Last Year:   Transportation Needs:   . Film/video editor (Medical):   Marland Kitchen Lack of Transportation (Non-Medical):   Physical Activity:   . Days of Exercise per Week:   . Minutes of Exercise per Session:   Stress:   . Feeling of Stress :   Social Connections:   . Frequency of Communication with Friends and Family:   . Frequency of Social Gatherings with Friends and Family:   . Attends Religious Services:   . Active Member of Clubs or Organizations:   . Attends Archivist Meetings:   Marland Kitchen Marital Status:      Family History: The patient's family  history includes Breast cancer in her cousin and maternal grandmother; Cancer in an other family member; Heart attack in her mother.  ROS:   Please see the history of present illness.     All other systems reviewed and are negative.  EKGs/Labs/Other Studies Reviewed:    The following studies were reviewed today:   EKG:  EKG is  ordered today.  The ekg ordered today demonstrates sinus rhythm, cannot rule out anterior lateral infarct.  Recent Labs: 02/19/2019: ALT 17; BUN 16; Creatinine, Ser 0.79; Hemoglobin 13.5; Platelets 381; Potassium 3.9; Sodium 138  Recent Lipid Panel No results found for: CHOL, TRIG, HDL, CHOLHDL, VLDL, LDLCALC, LDLDIRECT  Physical Exam:    VS:  BP 124/80 (BP Location: Right Arm, Patient Position: Sitting, Cuff Size: Large)   Pulse 81   Ht 5\' 6"  (1.676 m)   Wt 293 lb 8 oz (133.1 kg)   SpO2 99%   BMI 47.37 kg/m     Wt Readings from Last 3 Encounters:  05/23/19 293 lb 8 oz (133.1 kg)  05/18/19 290 lb (131.5 kg)  03/01/19 280 lb (127 kg)     GEN:  Well nourished, well developed in no acute distress HEENT: Normal NECK: No JVD; No carotid bruits LYMPHATICS: No lymphadenopathy CARDIAC: RRR, no murmurs, rubs, gallops RESPIRATORY:  Clear to auscultation without rales, wheezing or rhonchi  ABDOMEN: Soft, non-tender, non-distended MUSCULOSKELETAL:  No edema; No deformity  SKIN: Warm and dry NEUROLOGIC:  Alert and oriented x 3 PSYCHIATRIC:  Normal affect   ASSESSMENT:    1. Pulmonary hypertension ([place])   2. Essential hypertension   3. Morbid obesity ([place])   4. Preprocedural cardiovascular examination    PLAN:    In order of problems listed above:  1. Patient with moderate pulmonary hypertension found on recent echocardiogram.  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  Unsure if she has sleep apnea, as she will benefit from CPAP since she describes snoring while asleep.  But I will leave this  up to the expertise of pulmonary medicine.  Typically, treatment of underlying lung disease is the recommended management.  Unsure if patient has an overlap with pulmonary arterial hypertension (group 1).  We will thus schedule her right/left heart cath to evaluate this. 2. Patient with history of hypertension.  Blood pressure well controlled.  Continue current BP meds. 3. Patient is morbidly obese.  Weight loss advised.  This may be contributing to her PAH in terms of causing sleep disordered breathing.  Follow-up after heart cath.  This note was generated in part or whole with voice  recognition software. Voice recognition is usually quite accurate but there are transcription errors that can and very often do occur. I apologize for any typographical errors that were not detected and corrected.  Medication Adjustments/Labs and Tests Ordered: Current medicines are reviewed at length with the patient today.  Concerns regarding medicines are outlined above.  Orders Placed This Encounter  Procedures  . Basic metabolic panel  . CBC with Differential/Platelet  . EKG 12-Lead   No orders of the defined types were placed in this encounter.   Patient Instructions  Medication Instructions:  Your physician recommends that you continue on your current medications as directed. Please refer to the Current Medication list given to you today.  *If you need a refill on your cardiac medications before your next appointment, please call your pharmacy*   Lab Work: 1- Your physician recommends that you return for lab work in: [place], CBC.  2- COVID PRE- TEST: You will need a COVID TEST prior to the procedure:  LOCATION: [place] [facility].  DATE/TIME:  TODAY or TOMORROW 05/23/19 or 05/24/19 between 8:30am to 3 pm.  If you have labs (blood work) drawn today and your tests are completely normal, you will receive your results only by: Marland Kitchen MyChart Message (if you have MyChart)  OR . A paper copy in the mail If you have any lab test that is abnormal or we need to change your treatment, we will call you to review the results.   Testing/Procedures:  You are scheduled for a RIGHT AND LEFT Cardiac Catheterization on Friday, March 19 with Dr. Harrell Gave End.  1. Please arrive at the [facility] at 6:30 AM (This time is one hours before your procedure to ensure your preparation). Free valet parking service is available.   Special note: Every effort is made to have your procedure done on time. Please understand that emergencies sometimes delay scheduled procedures.  2. Diet: Do not eat solid foods after midnight.  The patient may have clear liquids until 5am upon the day of the procedure.  3. Labs: You will need to have blood drawn on Tuesday, March 16 at [facility] Entrance, Go to 1st desk on your right to register.  Address: [address], [place] [address]  Open: 8am - 5pm  Phone: [phone]. You do not need to be fasting.  4. Medication instructions in preparation for your procedure:   Contrast Allergy: No  On the morning of your procedure, take your Aspirin 81 mg and any morning medicines NOT listed above.  You may use sips of water.  5. Plan for one night stay--bring personal belongings. 6. Bring a current list of your medications and current insurance cards. 7. You MUST have a responsible person to drive you home. 8. Someone MUST be with you the first 24 hours after you arrive home or your discharge will be delayed. 9. Please wear clothes that are easy to get on and off and wear slip-on shoes.  Thank you for allowing [place] to care for you!   -- [facility]   Follow-Up: At [facility], you and your health needs are our priority.  As part of our continuing mission to provide you with exceptional heart care, we have created designated Provider Care Teams.  These Care Teams include your primary  Cardiologist (physician) and Advanced Practice Providers (APPs -  Physician Assistants and Nurse Practitioners) who all work together to provide you with  the care you need, when you need it.  We recommend signing up for the patient portal called "MyChart".  Sign up information is provided on this After Visit Summary.  MyChart is used to connect with patients for Virtual Visits (Telemedicine).  Patients are able to view lab/test results, encounter notes, upcoming appointments, etc.  Non-urgent messages can be sent to your provider as well.   To learn more about what you can do with MyChart, go to [email or web address].    Your next appointment:   2 week(s)  The format for your next appointment:   In Person  Provider:   Kate Sable, MD    Coronary Angiogram With Stent Coronary angiogram with stent placement is a procedure to widen or open a narrow blood vessel of the heart (coronary artery). Arteries may become blocked by cholesterol buildup (plaques) in the lining of the artery wall. When a coronary artery becomes partially blocked, blood flow to that area decreases. This may lead to chest pain or a heart attack (myocardial infarction). A stent is a small piece of metal that looks like mesh or spring. Stent placement may be done as treatment after a heart attack, or to prevent a heart attack if a blocked artery is found by a coronary angiogram. Let your health care provider know about:  Any allergies you have, including allergies to medicines or contrast dye.  All medicines you are taking, including vitamins, herbs, eye drops, creams, and over-the-counter medicines.  Any problems you or family members have had with anesthetic medicines.  Any blood disorders you have.  Any surgeries you have had.  Any medical conditions you have, including kidney problems or kidney failure.  Whether you are pregnant or may be pregnant.  Whether you are breastfeeding. What are the  risks? Generally, this is a safe procedure. However, serious problems may occur, including:  Damage to nearby structures or organs, such as the heart, blood vessels, or kidneys.  A return of blockage.  Bleeding, infection, or bruising at the insertion site.  A collection of blood under the skin (hematoma) at the insertion site.  A blood clot in another part of the body.  Allergic reaction to medicines or dyes.  Bleeding into the abdomen (retroperitoneal bleeding).  Stroke (rare).  Heart attack (rare). What happens before the procedure? Staying hydrated Follow instructions from your health care provider about hydration, which may include:  Up to 2 hours before the procedure - you may continue to drink clear liquids, such as water, clear fruit juice, black coffee, and plain tea.  Eating and drinking restrictions Follow instructions from your health care provider about eating and drinking, which may include:  8 hours before the procedure - stop eating heavy meals or foods, such as meat, fried foods, or fatty foods.  6 hours before the procedure - stop eating light meals or foods, such as toast or cereal.  2 hours before the procedure - stop drinking clear liquids. Medicines Ask your health care provider about:  Changing or stopping your regular medicines. This is especially important if you are taking diabetes medicines or blood thinners.  Taking medicines such as aspirin and ibuprofen. These medicines can thin your blood. Do not take these medicines unless your health care provider tells you to take them. ? Generally, aspirin is recommended before a thin tube, called a catheter, is passed through a blood vessel and inserted into the heart (cardiac catheterization).  Taking over-the-counter medicines, vitamins, herbs, and supplements. General instructions  Do  not use any products that contain nicotine or tobacco for at least 4 weeks before the procedure. These products  include cigarettes, e-cigarettes, and chewing tobacco. If you need help quitting, ask your health care provider.  Plan to have someone take you home from the hospital or clinic.  If you will be going home right after the procedure, plan to have someone with you for 24 hours.  You may have tests and imaging procedures.  Ask your health care provider: ? How your insertion site will be marked. Ask which artery will be used for the procedure. ? What steps will be taken to help prevent infection. These may include:  Removing hair at the insertion site.  Washing skin with a germ-killing soap.  Taking antibiotic medicine. What happens during the procedure?   An IV will be inserted into one of your veins.  Electrodes may be placed on your chest to monitor your heart rate during the procedure.  You will be given one or more of the following: ? A medicine to help you relax (sedative). ? A medicine to numb the area (local anesthetic) for catheter insertion.  A small incision will be made for catheter insertion.  The catheter will be inserted into an artery using a guide wire. The location may be in your groin, your wrist, or the fold of your arm (near your elbow).  An X-ray procedure (fluoroscopy) will be used to help guide the catheter to the opening of the heart arteries.  A dye will be injected into the catheter. X-rays will be taken. The dye helps to show where any narrowing or blockages are located in the arteries.  Tell your health care provider if you have chest pain or trouble breathing.  A tiny wire will be guided to the blocked spot, and a balloon will be inflated to make the artery wider.  The stent will be expanded to crush the plaques into the wall of the vessel. The stent will hold the area open and improve the blood flow. Most stents have a drug coating to reduce the risk of the stent narrowing over time.  The artery may be made wider using a drill, laser, or other tools  that remove plaques.  The catheter will be removed when the blood flow improves. The stent will stay where it was placed, and the lining of the artery will grow over it.  A bandage (dressing) will be placed on the insertion site. Pressure will be applied to stop bleeding.  The IV will be removed. This procedure may vary among health care providers and hospitals. What happens after the procedure?  Your blood pressure, heart rate, breathing rate, and blood oxygen level will be monitored until you leave the hospital or clinic.  If the procedure is done through the leg, you will lie flat in bed for a few hours or for as long as told by your health care provider. You will be instructed not to bend or cross your legs.  The insertion site and the pulse in your foot or wrist will be checked often.  You may have more blood tests, X-rays, and a test that records the electrical activity of your heart (electrocardiogram, or ECG).  Do not drive for 24 hours if you were given a sedative during your procedure. Summary  Coronary angiogram with stent placement is a procedure to widen or open a narrowed coronary artery. This is done to treat heart problems.  Before the procedure, let your health care  provider know about all the medical conditions and surgeries you have or have had.  This is a safe procedure. However, some problems may occur, including damage to nearby structures or organs, bleeding, blood clots, or allergies.  Follow your health care provider's instructions about eating, drinking, medicines, and other lifestyle changes, such as quitting tobacco use before the procedure. This information is not intended to replace advice given to you by your health care provider. Make sure you discuss any questions you have with your health care provider. Document Revised: 09/14/2018 Document Reviewed: 09/14/2018 Elsevier Patient Education  2020 [place], Kate Sable, MD   05/23/2019 9:42 AM    [place]

## 2019-05-23 NOTE — Progress Notes (Signed)
Cardiology Office Note:    Date:  05/23/2019   ID:  Shelley [name], DOB October 13, 1960, MRN [id number]  PCP:  Ronnell Freshwater, NP  Cardiologist:  Kate Sable, MD  Electrophysiologist:  None   Referring MD: Kendell Bane, NP   Chief Complaint  Patient presents with  . other    Pulmonary Hypertension. Meds reviewed verbally with pt.   Shelley [name] is a 59 y.o. female who is being seen today for the evaluation of pulmonary hypertension at the request of Versie Starks, Audie Clear, NP.   History of Present Illness:    Shelley [name] is a 59 y.o. female with a hx of hypertension, obesity who is being seen due to elevated pulmonary pressures.  Patient had an echocardiogram due to cardiomegaly that was found on previous imaging.  Outside echo date 11/25/2018 (report scanned in epic) briefly showed normal LVEF with EF [id number], normal diastolic function, RVSP of 54 (assumed RA pressure of 10), mild TR.  She states exercise treadmill stress test was normal.  Patient states having dyspnea on exertion for the past year now which is worsened.  She also endorses snoring when she sleeps.  Had a sleep study which showed hypoxia during sleep.  She was started on oxygen by pulmonary medicine.  Denies any history of heart disease, takes blood pressure meds as prescribed.   Past Medical History:  Diagnosis Date  . Anemia   . Arthritis   . Cardiomegaly   . Hypertension   . Pulmonary hypertension ([place])   . Thyroid disease     Past Surgical History:  Procedure Laterality Date  . TUBAL LIGATION      Current Medications: Current Meds  Medication Sig  . acetaminophen (TYLENOL) 500 MG tablet Take 1,000 mg by mouth every 6 (six) hours as needed for pain.  Marland Kitchen albuterol (VENTOLIN HFA) 108 (90 Base) MCG/ACT inhaler Inhale 1-2 puffs into the lungs every 6 (six) hours as needed for wheezing or shortness of breath.  . ALPRAZolam (XANAX) 0.25 MG tablet Take 1 tablet po QD PRN  . ibuprofen (ADVIL)  600 MG tablet Take 1 tablet (600 mg total) by mouth every 6 (six) hours as needed.  Marland Kitchen levonorgestrel (MIRENA) 20 MCG/24HR IUD 20 mcg by Intrauterine route.  Marland Kitchen losartan (COZAAR) 25 MG tablet Take 2 tablets (50 mg total) by mouth daily.  . montelukast (SINGULAIR) 10 MG tablet Take 1 tablet (10 mg total) by mouth at bedtime.  . Multiple Vitamin (MULTIVITAMIN) tablet Take 1 tablet by mouth daily.  Marland Kitchen SYNTHROID 25 MCG tablet Take 25 mcg by mouth daily.  Marland Kitchen tiZANidine (ZANAFLEX) 4 MG tablet Take 1 tablet (4 mg total) by mouth every 8 (eight) hours as needed for muscle spasms.     Allergies:   Amlodipine   Social History   Socioeconomic History  . Marital status: Single    Spouse name: Not on file  . Number of children: Not on file  . Years of education: Not on file  . Highest education level: Not on file  Occupational History  . Not on file  Tobacco Use  . Smoking status: Never Smoker  . Smokeless tobacco: Never Used  Substance and Sexual Activity  . Alcohol use: No  . Drug use: No  . Sexual activity: Yes    Birth control/protection: I.U.D.  Other Topics Concern  . Not on file  Social History Narrative  . Not on file   Social Determinants of Health   Financial  Resource Strain:   . Difficulty of Paying Living Expenses:   Food Insecurity:   . Worried About Charity fundraiser in the Last Year:   . Arboriculturist in the Last Year:   Transportation Needs:   . Film/video editor (Medical):   Marland Kitchen Lack of Transportation (Non-Medical):   Physical Activity:   . Days of Exercise per Week:   . Minutes of Exercise per Session:   Stress:   . Feeling of Stress :   Social Connections:   . Frequency of Communication with Friends and Family:   . Frequency of Social Gatherings with Friends and Family:   . Attends Religious Services:   . Active Member of Clubs or Organizations:   . Attends Archivist Meetings:   Marland Kitchen Marital Status:      Family History: The patient's family  history includes Breast cancer in her cousin and maternal grandmother; Cancer in an other family member; Heart attack in her mother.  ROS:   Please see the history of present illness.     All other systems reviewed and are negative.  EKGs/Labs/Other Studies Reviewed:    The following studies were reviewed today:   EKG:  EKG is  ordered today.  The ekg ordered today demonstrates sinus rhythm, cannot rule out anterior lateral infarct.  Recent Labs: 02/19/2019: ALT 17; BUN 16; Creatinine, Ser 0.79; Hemoglobin 13.5; Platelets 381; Potassium 3.9; Sodium 138  Recent Lipid Panel No results found for: CHOL, TRIG, HDL, CHOLHDL, VLDL, LDLCALC, LDLDIRECT  Physical Exam:    VS:  BP 124/80 (BP Location: Right Arm, Patient Position: Sitting, Cuff Size: Large)   Pulse 81   Ht 5\' 6"  (1.676 m)   Wt 293 lb 8 oz (133.1 kg)   SpO2 99%   BMI 47.37 kg/m     Wt Readings from Last 3 Encounters:  05/23/19 293 lb 8 oz (133.1 kg)  05/18/19 290 lb (131.5 kg)  03/01/19 280 lb (127 kg)     GEN:  Well nourished, well developed in no acute distress HEENT: Normal NECK: No JVD; No carotid bruits LYMPHATICS: No lymphadenopathy CARDIAC: RRR, no murmurs, rubs, gallops RESPIRATORY:  Clear to auscultation without rales, wheezing or rhonchi  ABDOMEN: Soft, non-tender, non-distended MUSCULOSKELETAL:  No edema; No deformity  SKIN: Warm and dry NEUROLOGIC:  Alert and oriented x 3 PSYCHIATRIC:  Normal affect   ASSESSMENT:    1. Pulmonary hypertension ([place])   2. Essential hypertension   3. Morbid obesity ([place])   4. Preprocedural cardiovascular examination    PLAN:    In order of problems listed above:  1. Patient with moderate pulmonary hypertension found on recent echocardiogram.  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  Unsure if she has sleep apnea, as she will benefit from CPAP since she describes snoring while asleep.  But I will leave this  up to the expertise of pulmonary medicine.  Typically, treatment of underlying lung disease is the recommended management.  Unsure if patient has an overlap with pulmonary arterial hypertension (group 1).  We will thus schedule her right/left heart cath to evaluate this. 2. Patient with history of hypertension.  Blood pressure well controlled.  Continue current BP meds. 3. Patient is morbidly obese.  Weight loss advised.  This may be contributing to her PAH in terms of causing sleep disordered breathing.  Follow-up after heart cath.  This note was generated in part or whole with voice  recognition software. Voice recognition is usually quite accurate but there are transcription errors that can and very often do occur. I apologize for any typographical errors that were not detected and corrected.  Medication Adjustments/Labs and Tests Ordered: Current medicines are reviewed at length with the patient today.  Concerns regarding medicines are outlined above.  Orders Placed This Encounter  Procedures  . Basic metabolic panel  . CBC with Differential/Platelet  . EKG 12-Lead   No orders of the defined types were placed in this encounter.   Patient Instructions  Medication Instructions:  Your physician recommends that you continue on your current medications as directed. Please refer to the Current Medication list given to you today.  *If you need a refill on your cardiac medications before your next appointment, please call your pharmacy*   Lab Work: 1- Your physician recommends that you return for lab work in: [place], CBC.  2- COVID PRE- TEST: You will need a COVID TEST prior to the procedure:  LOCATION: [facility].  DATE/TIME:  TODAY or TOMORROW 05/23/19 or 05/24/19 between 8:30am to 3 pm.  If you have labs (blood work) drawn today and your tests are completely normal, you will receive your results only by: Marland Kitchen MyChart Message (if you have MyChart)  OR . A paper copy in the mail If you have any lab test that is abnormal or we need to change your treatment, we will call you to review the results.   Testing/Procedures:  You are scheduled for a RIGHT AND LEFT Cardiac Catheterization on Friday, March 19 with Dr. Harrell Gave End.  1. Please arrive at the [facility] at 6:30 AM (This time is one hours before your procedure to ensure your preparation). Free valet parking service is available.   Special note: Every effort is made to have your procedure done on time. Please understand that emergencies sometimes delay scheduled procedures.  2. Diet: Do not eat solid foods after midnight.  The patient may have clear liquids until 5am upon the day of the procedure.  3. Labs: You will need to have blood drawn on Tuesday, March 16 at [facility], Go to 1st desk on your right to register.  Address: [address], [place] [address]  Open: 8am - 5pm  Phone: [phone]. You do not need to be fasting.  4. Medication instructions in preparation for your procedure:   Contrast Allergy: No  On the morning of your procedure, take your Aspirin 81 mg and any morning medicines NOT listed above.  You may use sips of water.  5. Plan for one night stay--bring personal belongings. 6. Bring a current list of your medications and current insurance cards. 7. You MUST have a responsible person to drive you home. 8. Someone MUST be with you the first 24 hours after you arrive home or your discharge will be delayed. 9. Please wear clothes that are easy to get on and off and wear slip-on shoes.  Thank you for allowing [place] to care for you!   -- [facility]   Follow-Up: At [facility], you and your health needs are our priority.  As part of our continuing mission to provide you with exceptional heart care, we have created designated Provider Care Teams.  These Care Teams include your primary  Cardiologist (physician) and Advanced Practice Providers (APPs -  Physician Assistants and Nurse Practitioners) who all work together to provide you with  the care you need, when you need it.  We recommend signing up for the patient portal called "MyChart".  Sign up information is provided on this After Visit Summary.  MyChart is used to connect with patients for Virtual Visits (Telemedicine).  Patients are able to view lab/test results, encounter notes, upcoming appointments, etc.  Non-urgent messages can be sent to your provider as well.   To learn more about what you can do with MyChart, go to [email or web address].    Your next appointment:   2 week(s)  The format for your next appointment:   In Person  Provider:   Kate Sable, MD    Coronary Angiogram With Stent Coronary angiogram with stent placement is a procedure to widen or open a narrow blood vessel of the heart (coronary artery). Arteries may become blocked by cholesterol buildup (plaques) in the lining of the artery wall. When a coronary artery becomes partially blocked, blood flow to that area decreases. This may lead to chest pain or a heart attack (myocardial infarction). A stent is a small piece of metal that looks like mesh or spring. Stent placement may be done as treatment after a heart attack, or to prevent a heart attack if a blocked artery is found by a coronary angiogram. Let your health care provider know about:  Any allergies you have, including allergies to medicines or contrast dye.  All medicines you are taking, including vitamins, herbs, eye drops, creams, and over-the-counter medicines.  Any problems you or family members have had with anesthetic medicines.  Any blood disorders you have.  Any surgeries you have had.  Any medical conditions you have, including kidney problems or kidney failure.  Whether you are pregnant or may be pregnant.  Whether you are breastfeeding. What are the  risks? Generally, this is a safe procedure. However, serious problems may occur, including:  Damage to nearby structures or organs, such as the heart, blood vessels, or kidneys.  A return of blockage.  Bleeding, infection, or bruising at the insertion site.  A collection of blood under the skin (hematoma) at the insertion site.  A blood clot in another part of the body.  Allergic reaction to medicines or dyes.  Bleeding into the abdomen (retroperitoneal bleeding).  Stroke (rare).  Heart attack (rare). What happens before the procedure? Staying hydrated Follow instructions from your health care provider about hydration, which may include:  Up to 2 hours before the procedure - you may continue to drink clear liquids, such as water, clear fruit juice, black coffee, and plain tea.  Eating and drinking restrictions Follow instructions from your health care provider about eating and drinking, which may include:  8 hours before the procedure - stop eating heavy meals or foods, such as meat, fried foods, or fatty foods.  6 hours before the procedure - stop eating light meals or foods, such as toast or cereal.  2 hours before the procedure - stop drinking clear liquids. Medicines Ask your health care provider about:  Changing or stopping your regular medicines. This is especially important if you are taking diabetes medicines or blood thinners.  Taking medicines such as aspirin and ibuprofen. These medicines can thin your blood. Do not take these medicines unless your health care provider tells you to take them. ? Generally, aspirin is recommended before a thin tube, called a catheter, is passed through a blood vessel and inserted into the heart (cardiac catheterization).  Taking over-the-counter medicines, vitamins, herbs, and supplements. General instructions  Do  not use any products that contain nicotine or tobacco for at least 4 weeks before the procedure. These products  include cigarettes, e-cigarettes, and chewing tobacco. If you need help quitting, ask your health care provider.  Plan to have someone take you home from the hospital or clinic.  If you will be going home right after the procedure, plan to have someone with you for 24 hours.  You may have tests and imaging procedures.  Ask your health care provider: ? How your insertion site will be marked. Ask which artery will be used for the procedure. ? What steps will be taken to help prevent infection. These may include:  Removing hair at the insertion site.  Washing skin with a germ-killing soap.  Taking antibiotic medicine. What happens during the procedure?   An IV will be inserted into one of your veins.  Electrodes may be placed on your chest to monitor your heart rate during the procedure.  You will be given one or more of the following: ? A medicine to help you relax (sedative). ? A medicine to numb the area (local anesthetic) for catheter insertion.  A small incision will be made for catheter insertion.  The catheter will be inserted into an artery using a guide wire. The location may be in your groin, your wrist, or the fold of your arm (near your elbow).  An X-ray procedure (fluoroscopy) will be used to help guide the catheter to the opening of the heart arteries.  A dye will be injected into the catheter. X-rays will be taken. The dye helps to show where any narrowing or blockages are located in the arteries.  Tell your health care provider if you have chest pain or trouble breathing.  A tiny wire will be guided to the blocked spot, and a balloon will be inflated to make the artery wider.  The stent will be expanded to crush the plaques into the wall of the vessel. The stent will hold the area open and improve the blood flow. Most stents have a drug coating to reduce the risk of the stent narrowing over time.  The artery may be made wider using a drill, laser, or other tools  that remove plaques.  The catheter will be removed when the blood flow improves. The stent will stay where it was placed, and the lining of the artery will grow over it.  A bandage (dressing) will be placed on the insertion site. Pressure will be applied to stop bleeding.  The IV will be removed. This procedure may vary among health care providers and hospitals. What happens after the procedure?  Your blood pressure, heart rate, breathing rate, and blood oxygen level will be monitored until you leave the hospital or clinic.  If the procedure is done through the leg, you will lie flat in bed for a few hours or for as long as told by your health care provider. You will be instructed not to bend or cross your legs.  The insertion site and the pulse in your foot or wrist will be checked often.  You may have more blood tests, X-rays, and a test that records the electrical activity of your heart (electrocardiogram, or ECG).  Do not drive for 24 hours if you were given a sedative during your procedure. Summary  Coronary angiogram with stent placement is a procedure to widen or open a narrowed coronary artery. This is done to treat heart problems.  Before the procedure, let your health care  provider know about all the medical conditions and surgeries you have or have had.  This is a safe procedure. However, some problems may occur, including damage to nearby structures or organs, bleeding, blood clots, or allergies.  Follow your health care provider's instructions about eating, drinking, medicines, and other lifestyle changes, such as quitting tobacco use before the procedure. This information is not intended to replace advice given to you by your health care provider. Make sure you discuss any questions you have with your health care provider. Document Revised: 09/14/2018 Document Reviewed: 09/14/2018 Elsevier Patient Education  2020 Section, Kate Sable, MD   05/23/2019 9:42 AM    [place]

## 2019-05-24 ENCOUNTER — Telehealth: Payer: Self-pay

## 2019-05-24 LAB — CBC WITH DIFFERENTIAL/PLATELET
Basophils Absolute: 0.1 10*3/uL (ref 0.0–0.2)
Basos: 1 %
EOS (ABSOLUTE): 0.3 10*3/uL (ref 0.0–0.4)
Eos: 3 %
Hematocrit: 39.9 % (ref 34.0–46.6)
Hemoglobin: 13.3 g/dL (ref 11.1–15.9)
Immature Grans (Abs): 0 10*3/uL (ref 0.0–0.1)
Immature Granulocytes: 0 %
Lymphocytes Absolute: 2.8 10*3/uL (ref 0.7–3.1)
Lymphs: 34 %
MCH: 29.1 pg (ref 26.6–33.0)
MCHC: 33.3 g/dL (ref 31.5–35.7)
MCV: 87 fL (ref 79–97)
Monocytes Absolute: 0.7 10*3/uL (ref 0.1–0.9)
Monocytes: 9 %
Neutrophils Absolute: 4.2 10*3/uL (ref 1.4–7.0)
Neutrophils: 53 %
Platelets: 373 10*3/uL (ref 150–450)
RBC: 4.57 x10E6/uL (ref 3.77–5.28)
RDW: 13.5 % (ref 11.7–15.4)
WBC: 8.1 10*3/uL (ref 3.4–10.8)

## 2019-05-24 LAB — BASIC METABOLIC PANEL
BUN/Creatinine Ratio: 14 (ref 9–23)
BUN: 10 mg/dL (ref 6–24)
CO2: 24 mmol/L (ref 20–29)
Calcium: 9.1 mg/dL (ref 8.7–10.2)
Chloride: 106 mmol/L (ref 96–106)
Creatinine, Ser: 0.73 mg/dL (ref 0.57–1.00)
GFR calc Af Amer: 105 mL/min/{1.73_m2} (ref >59)
GFR calc non Af Amer: 91 mL/min/{1.73_m2} (ref >59)
Glucose: 103 mg/dL — ABNORMAL HIGH (ref 65–99)
Potassium: 4.1 mmol/L (ref 3.5–5.2)
Sodium: 141 mmol/L (ref 134–144)

## 2019-05-24 NOTE — Telephone Encounter (Signed)
Patient returning call.

## 2019-05-24 NOTE — Telephone Encounter (Signed)
-----   Message from Nelva Bush, MD sent at 05/24/2019  7:40 AM EDT ----- COVID-19 negative.  Ok to proceed with catheterization, as planned.

## 2019-05-24 NOTE — Telephone Encounter (Signed)
Spoke with patient regarding results.  Patient verbalizes understanding and is agreeable to plan of care. Advised patient to call back with any issues or concerns.  

## 2019-05-24 NOTE — Telephone Encounter (Signed)
Left message on patients voicemail to please return our call.   

## 2019-05-24 NOTE — Telephone Encounter (Signed)
Patient is returning your call.  

## 2019-05-25 ENCOUNTER — Telehealth: Payer: Self-pay

## 2019-05-25 NOTE — Telephone Encounter (Signed)
DPR on file with an ok to leave a detailed message. lmom with lab results. Patient is to contact the office if any questions.

## 2019-05-25 NOTE — Telephone Encounter (Signed)
-----   Message from Kate Sable, MD sent at 05/24/2019  4:57 PM EDT ----- Normal creatinine.  Okay for heart cath.

## 2019-05-25 NOTE — Telephone Encounter (Signed)
Left message trying to clarify patient fmla paperwork and asked her to drop off original and expect time to go back to work. Beth

## 2019-05-26 ENCOUNTER — Telehealth: Payer: Self-pay

## 2019-05-26 ENCOUNTER — Other Ambulatory Visit: Payer: Self-pay

## 2019-05-26 ENCOUNTER — Encounter: Payer: Self-pay | Admitting: Internal Medicine

## 2019-05-26 ENCOUNTER — Encounter
Admission: RE | Disposition: A | Discharge status: Home / Self Care | Payer: Self-pay | Source: Home / Self Care | Attending: Internal Medicine

## 2019-05-26 ENCOUNTER — Ambulatory Visit
Admission: RE | Admit: 2019-05-26 | Discharge: 2019-05-26 | Disposition: A | Discharge status: Home / Self Care | Payer: No Typology Code available for payment source | Attending: Internal Medicine | Admitting: Internal Medicine

## 2019-05-26 DIAGNOSIS — Z79890 Hormone replacement therapy (postmenopausal): Secondary | ICD-10-CM | POA: Insufficient documentation

## 2019-05-26 DIAGNOSIS — E079 Disorder of thyroid, unspecified: Secondary | ICD-10-CM | POA: Insufficient documentation

## 2019-05-26 DIAGNOSIS — Z975 Presence of (intrauterine) contraceptive device: Secondary | ICD-10-CM | POA: Diagnosis not present

## 2019-05-26 DIAGNOSIS — I10 Essential (primary) hypertension: Secondary | ICD-10-CM | POA: Insufficient documentation

## 2019-05-26 DIAGNOSIS — I270 Primary pulmonary hypertension: Secondary | ICD-10-CM | POA: Diagnosis present

## 2019-05-26 DIAGNOSIS — Z888 Allergy status to other drugs, medicaments and biological substances status: Secondary | ICD-10-CM | POA: Diagnosis not present

## 2019-05-26 DIAGNOSIS — Z8249 Family history of ischemic heart disease and other diseases of the circulatory system: Secondary | ICD-10-CM | POA: Insufficient documentation

## 2019-05-26 DIAGNOSIS — I272 Pulmonary hypertension, unspecified: Secondary | ICD-10-CM | POA: Diagnosis present

## 2019-05-26 DIAGNOSIS — Z79899 Other long term (current) drug therapy: Secondary | ICD-10-CM | POA: Insufficient documentation

## 2019-05-26 DIAGNOSIS — R0602 Shortness of breath: Secondary | ICD-10-CM

## 2019-05-26 DIAGNOSIS — Z6841 Body Mass Index (BMI) 40.0 and over, adult: Secondary | ICD-10-CM | POA: Insufficient documentation

## 2019-05-26 DIAGNOSIS — R0683 Snoring: Secondary | ICD-10-CM | POA: Insufficient documentation

## 2019-05-26 HISTORY — PX: RIGHT/LEFT HEART CATH AND CORONARY ANGIOGRAPHY: CATH118266

## 2019-05-26 SURGERY — RIGHT/LEFT HEART CATH AND CORONARY ANGIOGRAPHY
Anesthesia: Moderate Sedation | Laterality: Bilateral

## 2019-05-26 MED ORDER — HEPARIN SODIUM (PORCINE) 1000 UNIT/ML IJ SOLN
INTRAMUSCULAR | Status: AC
  Filled 2019-05-26: qty 1

## 2019-05-26 MED ORDER — SODIUM CHLORIDE 0.9 % WEIGHT BASED INFUSION: 3.0000 mL/kg/h | INTRAVENOUS | Status: DC

## 2019-05-26 MED ORDER — SODIUM CHLORIDE 0.9 % IV SOLN: INTRAVENOUS | Status: DC

## 2019-05-26 MED ORDER — SODIUM CHLORIDE 0.9 % IV SOLN: 250.0000 mL | INTRAVENOUS | Status: DC | PRN

## 2019-05-26 MED ORDER — ONDANSETRON HCL 4 MG/2ML IJ SOLN: 4.0000 mg | Freq: Four times a day (QID) | INTRAMUSCULAR | Status: DC | PRN

## 2019-05-26 MED ORDER — LABETALOL HCL 5 MG/ML IV SOLN: 10.0000 mg | INTRAVENOUS | Status: DC | PRN

## 2019-05-26 MED ORDER — SODIUM CHLORIDE 0.9% FLUSH: 3.0000 mL | Freq: Two times a day (BID) | INTRAVENOUS | Status: DC

## 2019-05-26 MED ORDER — MIDAZOLAM HCL 2 MG/2ML IJ SOLN
INTRAMUSCULAR | Status: AC
  Filled 2019-05-26: qty 2

## 2019-05-26 MED ORDER — MIDAZOLAM HCL 2 MG/2ML IJ SOLN
INTRAMUSCULAR | Status: DC | PRN
  Administered 2019-05-26: 1 mg via INTRAVENOUS

## 2019-05-26 MED ORDER — SODIUM CHLORIDE 0.9% FLUSH: 3.0000 mL | INTRAVENOUS | Status: DC | PRN

## 2019-05-26 MED ORDER — VERAPAMIL HCL 2.5 MG/ML IV SOLN
INTRAVENOUS | Status: AC
  Filled 2019-05-26: qty 2

## 2019-05-26 MED ORDER — ACETAMINOPHEN 325 MG PO TABS: 650.0000 mg | ORAL_TABLET | ORAL | Status: DC | PRN

## 2019-05-26 MED ORDER — SODIUM CHLORIDE 0.9 % WEIGHT BASED INFUSION: 1.0000 mL/kg/h | INTRAVENOUS | Status: DC

## 2019-05-26 MED ORDER — VERAPAMIL HCL 2.5 MG/ML IV SOLN
INTRAVENOUS | Status: DC | PRN
  Administered 2019-05-26: 2.5 mg via INTRA_ARTERIAL

## 2019-05-26 MED ORDER — HEPARIN SODIUM (PORCINE) 1000 UNIT/ML IJ SOLN
INTRAMUSCULAR | Status: DC | PRN
  Administered 2019-05-26: 5000 [IU] via INTRAVENOUS

## 2019-05-26 MED ORDER — ASPIRIN 81 MG PO CHEW: 81.0000 mg | CHEWABLE_TABLET | ORAL | Status: DC

## 2019-05-26 MED ORDER — FENTANYL CITRATE (PF) 100 MCG/2ML IJ SOLN
INTRAMUSCULAR | Status: DC | PRN
  Administered 2019-05-26: 25 ug via INTRAVENOUS

## 2019-05-26 MED ORDER — IOHEXOL 300 MG/ML  SOLN
INTRAMUSCULAR | Status: DC | PRN
  Administered 2019-05-26: 65 mL

## 2019-05-26 MED ORDER — HEPARIN (PORCINE) IN NACL 1000-0.9 UT/500ML-% IV SOLN
INTRAVENOUS | Status: AC
  Filled 2019-05-26: qty 1000

## 2019-05-26 MED ORDER — HYDRALAZINE HCL 20 MG/ML IJ SOLN: 10.0000 mg | INTRAMUSCULAR | Status: DC | PRN

## 2019-05-26 MED ORDER — FENTANYL CITRATE (PF) 100 MCG/2ML IJ SOLN
INTRAMUSCULAR | Status: AC
  Filled 2019-05-26: qty 2

## 2019-05-26 MED ORDER — HEPARIN (PORCINE) IN NACL 1000-0.9 UT/500ML-% IV SOLN
INTRAVENOUS | Status: DC | PRN
  Administered 2019-05-26: 500 mL

## 2019-05-26 SURGICAL SUPPLY — 10 items
CATH 5F 110X4 TIG (CATHETERS) ×2 IMPLANT
CATH BALLN WEDGE 5F 110CM (CATHETERS) ×2 IMPLANT
DEVICE RAD TR BAND REGULAR (VASCULAR PRODUCTS) ×2 IMPLANT
GLIDESHEATH SLEND SS 6F .021 (SHEATH) ×2 IMPLANT
GUIDEWIRE .025 260CM (WIRE) ×2 IMPLANT
KIT MANI 3VAL PERCEP (MISCELLANEOUS) ×3 IMPLANT
PACK CARDIAC CATH (CUSTOM PROCEDURE TRAY) ×3 IMPLANT
PANNUS RETENTION SYSTEM 2 PAD (MISCELLANEOUS) ×2 IMPLANT
SHEATH GLIDE SLENDER 4/5FR (SHEATH) ×2 IMPLANT
WIRE ROSEN-J .035X260CM (WIRE) ×2 IMPLANT

## 2019-05-26 NOTE — Telephone Encounter (Signed)
-----   Message from Kate Sable, MD sent at 05/26/2019  1:25 PM EDT ----- No significant coronary artery disease.  Mildly elevated pulmonary artery pressures.  Keep follow-up appointment.

## 2019-05-26 NOTE — Telephone Encounter (Signed)
Spoke with patient regarding results and recommendation.  Patient verbalizes understanding and is agreeable to plan of care. Advised patient to call back with any issues or concerns.  

## 2019-05-26 NOTE — Interval H&P Note (Signed)
History and Physical Interval Note:  05/26/2019 7:42 AM  Shelley [name]  has presented today for surgery, with the diagnosis of shortness of breath and pulmonary hypertension.  The various methods of treatment have been discussed with the patient and family. After consideration of risks, benefits and other options for treatment, the patient has consented to  Procedure(s): RIGHT/LEFT HEART CATH AND CORONARY ANGIOGRAPHY (Bilateral) as a surgical intervention.  The patient's history has been reviewed, patient examined, no change in status, stable for surgery.  I have reviewed the patient's chart and labs.  Questions were answered to the patient's satisfaction.    Cath Lab Visit (complete for each Cath Lab visit)  Clinical Evaluation Leading to the Procedure:   ACS: No.  Non-ACS:    Anginal/Heart Failure Classification: NYHA III  Anti-ischemic medical therapy: No Therapy  Non-Invasive Test Results: No non-invasive testing performed  Prior CABG: No previous CABG  Benino Korinek

## 2019-05-29 ENCOUNTER — Telehealth: Payer: Self-pay

## 2019-05-29 ENCOUNTER — Other Ambulatory Visit: Payer: Self-pay

## 2019-05-29 NOTE — Telephone Encounter (Signed)
Received patient paperwork and advised her we will work on this and call once ready.

## 2019-05-30 ENCOUNTER — Telehealth: Payer: Self-pay

## 2019-05-30 NOTE — Telephone Encounter (Signed)
Faxed and mailed patient disability paperwork to [employer]. Put copy in scan. Shelley [name]

## 2019-06-01 NOTE — Progress Notes (Signed)
Cardiac cath results.

## 2019-06-02 ENCOUNTER — Ambulatory Visit: Payer: 59 | Admitting: Physician Assistant

## 2019-06-05 ENCOUNTER — Other Ambulatory Visit: Payer: Self-pay | Admitting: Nurse Practitioner

## 2019-06-05 ENCOUNTER — Telehealth: Payer: Self-pay

## 2019-06-05 DIAGNOSIS — M150 Primary generalized (osteo)arthritis: Secondary | ICD-10-CM

## 2019-06-05 MED ORDER — MELOXICAM 15 MG PO TABS: 15.0000 mg | ORAL_TABLET | Freq: Every day | ORAL | 2 refills | Status: DC | PRN

## 2019-06-05 NOTE — Telephone Encounter (Signed)
Lmom we send meloxicam

## 2019-06-05 NOTE — Progress Notes (Signed)
Sent new prescription for meloxicam 15mg  daily as needed for arthritis to [facility] [address].

## 2019-06-05 NOTE — Telephone Encounter (Signed)
Sent new prescription for meloxicam 15mg  daily as needed for arthritis to [facility] [address].

## 2019-06-05 NOTE — Telephone Encounter (Signed)
Need to know if she is taking meloxicam or ibuprofen. Can only take one or the other. Currently, ibuprofen is the one on her list.

## 2019-06-08 ENCOUNTER — Other Ambulatory Visit: Payer: Self-pay

## 2019-06-08 ENCOUNTER — Encounter: Payer: Self-pay | Admitting: Cardiology

## 2019-06-08 ENCOUNTER — Ambulatory Visit (INDEPENDENT_AMBULATORY_CARE_PROVIDER_SITE_OTHER): Payer: 59 | Admitting: Cardiology

## 2019-06-08 VITALS — BP 130/82 | HR 82 | Ht 66.0 in | Wt 291.0 lb

## 2019-06-08 DIAGNOSIS — I10 Essential (primary) hypertension: Secondary | ICD-10-CM

## 2019-06-08 DIAGNOSIS — I272 Pulmonary hypertension, unspecified: Secondary | ICD-10-CM | POA: Diagnosis not present

## 2019-06-08 NOTE — Progress Notes (Signed)
Cardiology Office Note:    Date:  06/08/2019   ID:  [name], DOB [birth year], MRN [id number]  PCP:  Ronnell Freshwater, NP  Cardiologist:  Kate Sable, MD  Electrophysiologist:  None   Referring MD: Ronnell Freshwater, NP   Chief Complaint  Patient presents with  . Other    2 week follow up post cath. meds reviewed verbally with patient.      History of Present Illness:    Shelley [name] is a 59 y.o. female with a hx of hypertension, obesity who presents for follow-up.  She was last seen due to elevated pulmonary pressures.  Patient had an echocardiogram due to cardiomegaly that was found on previous imaging.  Outside echo date 11/25/2018 (report scanned in epic) briefly showed normal LVEF with EF [id number], normal diastolic function, RVSP of 54 (assumed RA pressure of 10), mild TR. she had an exercise treadmill stress test which was normal.  She also endorses snoring when she sleeps.  Had a sleep study which showed hypoxia during sleep.  She was started on oxygen by pulmonary medicine.  Right and left heart cath was ordered to evaluate pulmonary pressures to see if she has any overlap/type I pulmonary hypertension.    Past Medical History:  Diagnosis Date  . Anemia   . Arthritis   . Cardiomegaly   . Hypertension   . Pulmonary hypertension ([place])   . Thyroid disease     Past Surgical History:  Procedure Laterality Date  . RIGHT/LEFT HEART CATH AND CORONARY ANGIOGRAPHY Bilateral 05/26/2019   Procedure: RIGHT/LEFT HEART CATH AND CORONARY ANGIOGRAPHY;  Surgeon: Nelva Bush, MD;  Location: [facility] [place] [facility];  Service: Cardiovascular;  Laterality: Bilateral;  . TUBAL LIGATION      Current Medications: Current Meds  Medication Sig  . acetaminophen (TYLENOL) 500 MG tablet Take 1,000 mg by mouth every 6 (six) hours as needed for pain.  Marland Kitchen ALPRAZolam (XANAX) 0.25 MG tablet Take 1 tablet po QD PRN  . levonorgestrel (MIRENA) 20 MCG/24HR IUD 20 mcg by Intrauterine  route.  Marland Kitchen losartan (COZAAR) 25 MG tablet Take 2 tablets (50 mg total) by mouth daily.  . meloxicam (MOBIC) 15 MG tablet Take 1 tablet (15 mg total) by mouth daily as needed.  . Multiple Vitamin (MULTIVITAMIN) tablet Take 1 tablet by mouth daily.  Marland Kitchen SYNTHROID 25 MCG tablet Take 25 mcg by mouth daily.   Current Facility-Administered Medications for the 06/08/19 encounter (Office Visit) with Kate Sable, MD  Medication  . sodium chloride flush (NS) 0.9 % injection 3 mL     Allergies:   Amlodipine   Social History   Socioeconomic History  . Marital status: Single    Spouse name: Not on file  . Number of children: Not on file  . Years of education: Not on file  . Highest education level: Not on file  Occupational History  . Not on file  Tobacco Use  . Smoking status: Never Smoker  . Smokeless tobacco: Never Used  Substance and Sexual Activity  . Alcohol use: No  . Drug use: No  . Sexual activity: Yes    Birth control/protection: I.U.D.  Other Topics Concern  . Not on file  Social History Narrative  . Not on file   Social Determinants of Health   Financial Resource Strain:   . Difficulty of Paying Living Expenses:   Food Insecurity:   . Worried About Charity fundraiser in the Last Year:   .  Ran Out of Food in the Last Year:   Transportation Needs:   . Film/video editor (Medical):   Marland Kitchen Lack of Transportation (Non-Medical):   Physical Activity:   . Days of Exercise per Week:   . Minutes of Exercise per Session:   Stress:   . Feeling of Stress :   Social Connections:   . Frequency of Communication with Friends and Family:   . Frequency of Social Gatherings with Friends and Family:   . Attends Religious Services:   . Active Member of Clubs or Organizations:   . Attends Archivist Meetings:   Marland Kitchen Marital Status:      Family History: The patient's family history includes Breast cancer in her cousin and maternal grandmother; Cancer in an other family  member; Heart attack in her mother.  ROS:   Please see the history of present illness.     All other systems reviewed and are negative.  EKGs/Labs/Other Studies Reviewed:    The following studies were reviewed today: Right and left heart cath, date 05/26/2019 1. No angiographically significant coronary artery disease involving the main coronary arteries.  There is tapering of the distal LAD that likely reflects a normal variant though mild to moderate diffuse disease cannot be excluded. 2. Upper normal to mildly elevated left heart, right heart, and pulmonary artery pressures. 3. Normal Fick cardiac output/index.  Right Heart Pressures RA (mean): 10 mmHg RV (S/EDP): 35/11 mmHg PA (S/D, mean): 35/17 (23) mmHg PCWP (mean): 14 mmHg  Ao sat: 96% PA sat: 71%  Fick CO: 7.4 L/min Fick CI: 3.2 L/min/m^2     EKG:  EKG is  ordered today.  The ekg ordered today demonstrates sinus rhythm, cannot rule out anterior infarct.  Recent Labs: 02/19/2019: ALT 17 05/23/2019: BUN 10; Creatinine, Ser 0.73; Hemoglobin 13.3; Platelets 373; Potassium 4.1; Sodium 141  Recent Lipid Panel No results found for: CHOL, TRIG, HDL, CHOLHDL, VLDL, LDLCALC, LDLDIRECT  Physical Exam:    VS:  BP 130/82 (BP Location: Left Arm, Patient Position: Sitting, Cuff Size: Normal)   Pulse 82   Ht 5\' 6"  (1.676 m)   Wt 291 lb (132 kg)   SpO2 97%   BMI 46.97 kg/m     Wt Readings from Last 3 Encounters:  06/08/19 291 lb (132 kg)  05/26/19 293 lb 6.9 oz (133.1 kg)  05/23/19 293 lb 8 oz (133.1 kg)     GEN:  Well nourished, well developed in no acute distress HEENT: Normal NECK: No JVD; No carotid bruits LYMPHATICS: No lymphadenopathy CARDIAC: RRR, no murmurs, rubs, gallops RESPIRATORY:  Clear to auscultation without rales, wheezing or rhonchi  ABDOMEN: Soft, non-tender, non-distended MUSCULOSKELETAL:  No edema; No deformity  SKIN: Warm and dry NEUROLOGIC:  Alert and oriented x 3 PSYCHIATRIC:  Normal affect    ASSESSMENT:    1. Pulmonary hypertension ([place])   2. Essential hypertension   3. Morbid obesity ([place])    PLAN:    In order of problems listed above:  1. Patient with moderate pulmonary hypertension found on recent echocardiogram.  Right heart cath showed mildly elevated pulmonary artery pressure with capillary pressure 71mmHg consistent with precapillary pulmonary hypertension.  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  Unsure if she has sleep apnea, as she will benefit from CPAP since she describes snoring while asleep.  Again, I will leave this up to the expertise of pulmonary medicine.  Discussed at length with patient and daughters  who one the phone during this visit, the benefits of low calorie diet and weight loss, as this will help her symptoms.  No further cardiac testing indicated at this time. 2. Patient with history of hypertension.  Blood pressure well controlled.  Continue current BP meds. 3. Patient is morbidly obese.  Weight loss advised.  This may be contributing to her PAH in terms of causing sleep disordered breathing.  Follow-up 6 months  Total encounter time 45 minutes  Greater than 50% was spent in counseling and coordination of care with the patient Time spent educating patient and daughters on etiology of pulmonary hypertension, different classes of pulmonary hypertension, treatment strategy, reason for testing, therapeutic options.  All questions with answered for both the patient and family.   This note was generated in part or whole with voice recognition software. Voice recognition is usually quite accurate but there are transcription errors that can and very often do occur. I apologize for any typographical errors that were not detected and corrected.  Medication Adjustments/Labs and Tests Ordered: Current medicines are reviewed at length with the patient today.  Concerns regarding medicines are outlined  above.  Orders Placed This Encounter  Procedures  . EKG 12-Lead   No orders of the defined types were placed in this encounter.   Patient Instructions  Medication Instructions:  Your physician recommends that you continue on your current medications as directed. Please refer to the Current Medication list given to you today.  *If you need a refill on your cardiac medications before your next appointment, please call your pharmacy*   Follow-Up: At [facility], you and your health needs are our priority.  As part of our continuing mission to provide you with exceptional heart care, we have created designated Provider Care Teams.  These Care Teams include your primary Cardiologist (physician) and Advanced Practice Providers (APPs -  Physician Assistants and Nurse Practitioners) who all work together to provide you with the care you need, when you need it.  We recommend signing up for the patient portal called "MyChart".  Sign up information is provided on this After Visit Summary.  MyChart is used to connect with patients for Virtual Visits (Telemedicine).  Patients are able to view lab/test results, encounter notes, upcoming appointments, etc.  Non-urgent messages can be sent to your provider as well.   To learn more about what you can do with MyChart, go to [email or web address].    Your next appointment:   6 month(s)  The format for your next appointment:   In Person  Provider:    You may see Kate Sable, MD or one of the following Advanced Practice Providers on your designated Care Team:    Murray Hodgkins, NP  Christell Faith, PA-C  Marrianne Mood, PA-C    Signed, Kate Sable, MD  06/08/2019 12:15 PM    [place]

## 2019-06-08 NOTE — Patient Instructions (Signed)
Medication Instructions:  Your physician recommends that you continue on your current medications as directed. Please refer to the Current Medication list given to you today.  *If you need a refill on your cardiac medications before your next appointment, please call your pharmacy*  Follow-Up: At [facility], you and your health needs are our priority.  As part of our continuing mission to provide you with exceptional heart care, we have created designated Provider Care Teams.  These Care Teams include your primary Cardiologist (physician) and Advanced Practice Providers (APPs -  Physician Assistants and Nurse Practitioners) who all work together to provide you with the care you need, when you need it.  We recommend signing up for the patient portal called "MyChart".  Sign up information is provided on this After Visit Summary.  MyChart is used to connect with patients for Virtual Visits (Telemedicine).  Patients are able to view lab/test results, encounter notes, upcoming appointments, etc.  Non-urgent messages can be sent to your provider as well.   To learn more about what you can do with MyChart, go to [email or web address].    Your next appointment:   6 month(s)  The format for your next appointment:   In Person  Provider:    You may see Brian Agbor-Etang, MD or one of the following Advanced Practice Providers on your designated Care Team:    Christopher Berge, NP  Ryan Dunn, PA-C  Jacquelyn Visser, PA-C  

## 2019-06-13 ENCOUNTER — Other Ambulatory Visit: Payer: Self-pay

## 2019-06-13 DIAGNOSIS — I10 Essential (primary) hypertension: Secondary | ICD-10-CM

## 2019-06-13 MED ORDER — LOSARTAN POTASSIUM 25 MG PO TABS: 50.0000 mg | ORAL_TABLET | Freq: Every day | ORAL | 1 refills | Status: DC

## 2019-06-15 ENCOUNTER — Telehealth: Payer: Self-pay

## 2019-06-15 NOTE — Telephone Encounter (Signed)
Called twice to confirm for 06-19-19 ov and calls were answered but there was no response. Unable to confirm.

## 2019-06-15 NOTE — Telephone Encounter (Signed)
Beth confirmed appointment on 06/19/2019 and screened for covid. klh

## 2019-06-19 ENCOUNTER — Other Ambulatory Visit: Payer: Self-pay

## 2019-06-19 ENCOUNTER — Encounter: Payer: Self-pay | Admitting: Nurse Practitioner

## 2019-06-19 ENCOUNTER — Ambulatory Visit (INDEPENDENT_AMBULATORY_CARE_PROVIDER_SITE_OTHER): Payer: 59 | Admitting: Nurse Practitioner

## 2019-06-19 VITALS — BP 132/84 | HR 89 | Temp 97.4°F | Resp 16 | Ht 66.0 in | Wt 295.0 lb

## 2019-06-19 DIAGNOSIS — E039 Hypothyroidism, unspecified: Secondary | ICD-10-CM | POA: Diagnosis not present

## 2019-06-19 DIAGNOSIS — R300 Dysuria: Secondary | ICD-10-CM

## 2019-06-19 DIAGNOSIS — I270 Primary pulmonary hypertension: Secondary | ICD-10-CM | POA: Diagnosis not present

## 2019-06-19 DIAGNOSIS — Z0001 Encounter for general adult medical examination with abnormal findings: Secondary | ICD-10-CM

## 2019-06-19 DIAGNOSIS — I10 Essential (primary) hypertension: Secondary | ICD-10-CM

## 2019-06-19 DIAGNOSIS — Z1231 Encounter for screening mammogram for malignant neoplasm of breast: Secondary | ICD-10-CM

## 2019-06-19 NOTE — Progress Notes (Signed)
[facility] [place], [place] [address]  Internal MEDICINE  Office Visit Note  Patient Name: Shelley [name]  [id number]  [id number]  Date of Service: 06/19/2019   Pt is here for routine health maintenance examination    Chief Complaint  Patient presents with  . Annual Exam  . Hypertension  . Hypothyroidism  . Anemia  . Arthritis  . Quality Metric Gaps    mammogram and colonoscopy      The patient is here for health maintenance exam. She has persistent shortness of breath. Does have diagnosis of pulmonary hypertension and sees pulmonology. She had cardiac catheterization done recently which showed no evidence of ischemia or blockages. She will continue to see cardiology. Blood pressure is well controlled. Her biggest concern is weight. She wants to start changing her diet to make is healthier and lower calorie. She is due to have routine, fasting labs done. Mammogram is due after 08/11/2019. She prefers to wait on colonoscopy until routine screening for COVID 19 is not needed prior to hospital procedures.   Current Medication: Outpatient Encounter Medications as of 06/19/2019  Medication Sig  . acetaminophen (TYLENOL) 500 MG tablet Take 1,000 mg by mouth every 6 (six) hours as needed for pain.  Marland Kitchen ALPRAZolam (XANAX) 0.25 MG tablet Take 1 tablet po QD PRN  . levonorgestrel (MIRENA) 20 MCG/24HR IUD 20 mcg by Intrauterine route.  Marland Kitchen losartan (COZAAR) 25 MG tablet Take 2 tablets (50 mg total) by mouth daily.  . meloxicam (MOBIC) 15 MG tablet Take 1 tablet (15 mg total) by mouth daily as needed.  . Multiple Vitamin (MULTIVITAMIN) tablet Take 1 tablet by mouth daily.  Marland Kitchen SYNTHROID 25 MCG tablet Take 25 mcg by mouth daily.   Facility-Administered Encounter Medications as of 06/19/2019  Medication  . sodium chloride flush (NS) 0.9 % injection 3 mL    Surgical History: Past Surgical History:  Procedure Laterality Date  . RIGHT/LEFT HEART CATH AND CORONARY  ANGIOGRAPHY Bilateral 05/26/2019   Procedure: RIGHT/LEFT HEART CATH AND CORONARY ANGIOGRAPHY;  Surgeon: Nelva Bush, MD;  Location: [place] [facility];  Service: Cardiovascular;  Laterality: Bilateral;  . TUBAL LIGATION      Medical History: Past Medical History:  Diagnosis Date  . Anemia   . Arthritis   . Cardiomegaly   . Hypertension   . Pulmonary hypertension ([place])   . Thyroid disease     Family History: Family History  Problem Relation Age of Onset  . Cancer Other   . Breast cancer Maternal Grandmother   . Breast cancer Cousin        1st maternal cousin  . Heart attack Mother       Review of Systems  Constitutional: Positive for fatigue. Negative for chills and unexpected weight change.  HENT: Negative for congestion, rhinorrhea, sneezing and sore throat.   Respiratory: Positive for shortness of breath. Negative for cough and chest tightness.   Cardiovascular: Negative for chest pain and palpitations.       Recent cardiac catheterization negative for evidence of ischemia or blockages in coronary arteries.   Gastrointestinal: Negative for abdominal pain, constipation, diarrhea, nausea and vomiting.  Endocrine: Negative for cold intolerance, heat intolerance, polydipsia and polyuria.  Genitourinary: Negative for dysuria, frequency and urgency.  Musculoskeletal: Negative for arthralgias, back pain, joint swelling and neck pain.  Skin: Negative for rash.  Allergic/Immunologic: Negative for environmental allergies.  Neurological: Negative for tremors and numbness.  Hematological: Negative for adenopathy. Does not bruise/bleed  easily.  Psychiatric/Behavioral: Negative for behavioral problems and sleep disturbance. The patient is not nervous/anxious.      Today's Vitals   06/19/19 0831  BP: 132/84  Pulse: 89  Resp: 16  Temp: (!) 97.4 F (36.3 C)  SpO2: 97%  Weight: 295 lb (133.8 kg)  Height: 5\' 6"  (1.676 m)   Body mass index is 47.61 kg/m.  Physical  Exam Vitals and nursing note reviewed.  Constitutional:      General: She is not in acute distress.    Appearance: Normal appearance. She is well-developed. She is obese. She is not diaphoretic.  HENT:     Head: Normocephalic and atraumatic.     Nose: Nose normal.     Mouth/Throat:     Pharynx: No oropharyngeal exudate.  Eyes:     Pupils: Pupils are equal, round, and reactive to light.  Neck:     Thyroid: No thyromegaly.     Vascular: No carotid bruit or JVD.     Trachea: No tracheal deviation.  Cardiovascular:     Rate and Rhythm: Normal rate and regular rhythm.     Pulses: Normal pulses.     Heart sounds: Normal heart sounds. No murmur. No friction rub. No gallop.   Pulmonary:     Effort: Pulmonary effort is normal. No respiratory distress.     Breath sounds: Normal breath sounds. No wheezing or rales.  Chest:     Chest wall: No tenderness.     Breasts:        Right: Inverted nipple present. No swelling, bleeding, mass, nipple discharge, skin change or tenderness.        Left: Normal. No swelling, bleeding, inverted nipple, mass, nipple discharge, skin change or tenderness.  Abdominal:     General: Bowel sounds are normal.     Palpations: Abdomen is soft.     Tenderness: There is no abdominal tenderness.  Musculoskeletal:        General: Normal range of motion.     Cervical back: Normal range of motion and neck supple.  Lymphadenopathy:     Cervical: No cervical adenopathy.     Upper Body:     Right upper body: No axillary adenopathy.     Left upper body: No axillary adenopathy.  Skin:    General: Skin is warm and dry.  Neurological:     Mental Status: She is alert and oriented to person, place, and time.     Cranial Nerves: No cranial nerve deficit.  Psychiatric:        Mood and Affect: Mood normal.        Behavior: Behavior normal.        Thought Content: Thought content normal.        Judgment: Judgment normal.      LABS: Recent Results (from the past 2160  hour(s))  Basic metabolic panel     Status: Abnormal   Collection Time: 05/23/19  9:15 AM  Result Value Ref Range   Glucose 103 (H) 65 - 99 mg/dL   BUN 10 6 - 24 mg/dL   Creatinine, Ser 0.73 0.57 - 1.00 mg/dL   GFR calc non Af Amer 91 >59 mL/min/1.73   GFR calc Af Amer 105 >59 mL/min/1.73   BUN/Creatinine Ratio 14 9 - 23   Sodium 141 134 - 144 mmol/L   Potassium 4.1 3.5 - 5.2 mmol/L   Chloride 106 96 - 106 mmol/L   CO2 24 20 - 29 mmol/L   Calcium  9.1 8.7 - 10.2 mg/dL  CBC with Differential/Platelet     Status: None   Collection Time: 05/23/19  9:15 AM  Result Value Ref Range   WBC 8.1 3.4 - 10.8 x10E3/uL   RBC 4.57 3.77 - 5.28 x10E6/uL   Hemoglobin 13.3 11.1 - 15.9 g/dL   Hematocrit 39.9 34.0 - 46.6 %   MCV 87 79 - 97 fL   MCH 29.1 26.6 - 33.0 pg   MCHC 33.3 31.5 - 35.7 g/dL   RDW 13.5 11.7 - 15.4 %   Platelets 373 150 - 450 x10E3/uL   Neutrophils 53 Not Estab. %   Lymphs 34 Not Estab. %   Monocytes 9 Not Estab. %   Eos 3 Not Estab. %   Basos 1 Not Estab. %   Neutrophils Absolute 4.2 1.4 - 7.0 x10E3/uL   Lymphocytes Absolute 2.8 0.7 - 3.1 x10E3/uL   Monocytes Absolute 0.7 0.1 - 0.9 x10E3/uL   EOS (ABSOLUTE) 0.3 0.0 - 0.4 x10E3/uL   Basophils Absolute 0.1 0.0 - 0.2 x10E3/uL   Immature Granulocytes 0 Not Estab. %   Immature Grans (Abs) 0.0 0.0 - 0.1 x10E3/uL  SARS CORONAVIRUS 2 (TAT 6-24 HRS) Nasopharyngeal Nasopharyngeal Swab     Status: None   Collection Time: 05/23/19 10:02 AM   Specimen: Nasopharyngeal Swab  Result Value Ref Range   SARS Coronavirus 2 NEGATIVE NEGATIVE    Comment: (NOTE) SARS-CoV-2 target nucleic acids are NOT DETECTED. The SARS-CoV-2 RNA is generally detectable in upper and lower respiratory specimens during the acute phase of infection. Negative results do not preclude SARS-CoV-2 infection, do not rule out co-infections with other pathogens, and should not be used as the sole basis for treatment or other patient management decisions. Negative  results must be combined with clinical observations, patient history, and epidemiological information. The expected result is Negative. Fact Sheet for Patients: SugarRoll.be Fact Sheet for Healthcare Providers: https://www.woods-mathews.com/ This test is not yet approved or cleared by the [place] FDA and  has been authorized for detection and/or diagnosis of SARS-CoV-2 by FDA under an Emergency Use Authorization (EUA). This EUA will remain  in effect (meaning this test can be used) for the duration of the COVID-19 declaration under Section 56 4(b)(1) of the Act, 21 U.S.C. section 360bbb-3(b)(1), unless the authorization is terminated or revoked sooner. Performed at [facility], [place] [address] [place], [place] [address]     Assessment/Plan: 1. Encounter for general adult medical examination with abnormal findings Annual health maintenance exam today. Lab slip given to check routine, fasting labs   2. Essential hypertension Stable. Continue bp medication as prescribed   3. Pulmonary hypertension, primary ([place]) Recent cardiac catheterization showing no ischemia or evidence of blockages. She should continue to follow up with pulmonary and cardiology as scheduled.   4. Acquired hypothyroidism Check thyroid panel and adjust levothyroxine as indicated   5. Encounter for screening mammogram for malignant neoplasm of breast - MM DIGITAL SCREENING BILATERAL; Future  6. Dysuria - Urinalysis, Routine w reflex microscopic  General Counseling: Sashay verbalizes understanding of the findings of todays visit and agrees with plan of treatment. I have discussed any further diagnostic evaluation that may be needed or ordered today. We also reviewed her medications today. she has been encouraged to call the office with any questions or concerns that should arise related to todays visit.    Counseling:  This patient was seen by Leretha Pol FNP Collaboration with Dr Lavera Guise as  a part of collaborative care agreement  Orders Placed This Encounter  Procedures  . MM DIGITAL SCREENING BILATERAL  . Urinalysis, Routine w reflex microscopic   Diet information provided to the patient. She was given 1500, 1600, and 1800 calorie diet information. Will discuss adding in low-impact exercise at her next visit.   Total time spent: 41 Minutes  Time spent includes review of chart, medications, test results, and follow up plan with the patient.     Lavera Guise, MD  Internal Medicine

## 2019-06-20 LAB — URINALYSIS, ROUTINE W REFLEX MICROSCOPIC
Bilirubin, UA: NEGATIVE
Glucose, UA: NEGATIVE
Ketones, UA: NEGATIVE
Nitrite, UA: NEGATIVE
Protein,UA: NEGATIVE
RBC, UA: NEGATIVE
Specific Gravity, UA: 1.016 (ref 1.005–1.030)
Urobilinogen, Ur: 0.2 mg/dL (ref 0.2–1.0)
pH, UA: 6 (ref 5.0–7.5)

## 2019-06-20 LAB — MICROSCOPIC EXAMINATION: Casts: NONE SEEN /lpf

## 2019-06-20 NOTE — Progress Notes (Signed)
Presence of blood and bacteria in urine. Can you have them culture this if possible? Thanks.

## 2019-06-23 LAB — SPECIMEN STATUS REPORT

## 2019-06-23 LAB — URINE CULTURE

## 2019-07-01 ENCOUNTER — Other Ambulatory Visit: Payer: Self-pay | Admitting: Nurse Practitioner

## 2019-07-01 DIAGNOSIS — R05 Cough: Secondary | ICD-10-CM

## 2019-07-01 DIAGNOSIS — J0140 Acute pansinusitis, unspecified: Secondary | ICD-10-CM

## 2019-07-01 DIAGNOSIS — R059 Cough, unspecified: Secondary | ICD-10-CM

## 2019-07-01 MED ORDER — AMOXICILLIN 875 MG PO TABS: 875.0000 mg | ORAL_TABLET | Freq: Two times a day (BID) | ORAL | 0 refills | Status: DC

## 2019-07-01 MED ORDER — HYDROCOD POLST-CPM POLST ER 10-8 MG/5ML PO SUER: 5.0000 mL | Freq: Two times a day (BID) | ORAL | 0 refills | Status: DC | PRN

## 2019-07-01 NOTE — Progress Notes (Signed)
Patient diagnosed with COVID 19 and has sesvere sinusitis with congestion and cough. Started amoxicillin 875mg  bid for 10 days and added tussionex to take twice daily as needed for cough and congestion. Both prescriptions were sent to [address].

## 2019-07-18 ENCOUNTER — Telehealth: Payer: Self-pay

## 2019-07-18 NOTE — Telephone Encounter (Signed)
Called lmom informing patient of appointment on 07/20/2019. klh 

## 2019-07-20 ENCOUNTER — Encounter: Payer: Self-pay | Admitting: Adult Health

## 2019-07-20 ENCOUNTER — Other Ambulatory Visit: Payer: Self-pay

## 2019-07-20 ENCOUNTER — Ambulatory Visit: Payer: 59 | Admitting: Adult Health

## 2019-07-20 VITALS — BP 155/87 | HR 82 | Temp 97.4°F | Resp 16 | Ht 66.0 in | Wt 292.0 lb

## 2019-07-20 DIAGNOSIS — R059 Cough, unspecified: Secondary | ICD-10-CM

## 2019-07-20 DIAGNOSIS — I10 Essential (primary) hypertension: Secondary | ICD-10-CM | POA: Diagnosis not present

## 2019-07-20 DIAGNOSIS — G4734 Idiopathic sleep related nonobstructive alveolar hypoventilation: Secondary | ICD-10-CM

## 2019-07-20 DIAGNOSIS — R05 Cough: Secondary | ICD-10-CM

## 2019-07-20 DIAGNOSIS — I270 Primary pulmonary hypertension: Secondary | ICD-10-CM

## 2019-07-20 DIAGNOSIS — Z6841 Body Mass Index (BMI) 40.0 and over, adult: Secondary | ICD-10-CM

## 2019-07-20 MED ORDER — BENZONATATE 100 MG PO CAPS: 100.0000 mg | ORAL_CAPSULE | Freq: Two times a day (BID) | ORAL | 0 refills | Status: DC | PRN

## 2019-07-20 MED ORDER — HYDROCOD POLST-CPM POLST ER 10-8 MG/5ML PO SUER: 5.0000 mL | Freq: Two times a day (BID) | ORAL | 0 refills | Status: DC | PRN

## 2019-07-20 NOTE — Progress Notes (Signed)
[facility] [place], [place] [address]  Pulmonary Sleep Medicine   Office Visit Note  Patient Name: Shelley [name] DOB: 1961/02/19 MRN OF:[id number]  Date of Service: 07/20/2019  Complaints/HPI: Pt is here for pulmonary follow up.  She reports since our last visit she was diagnosed with COVID.  She was out of work for 10 days and never had any severe symptoms.  Now she has a little lingering cough, and some sob at times.  She denies any fever or other symptoms.   ROS  General: (-) fever, (-) chills, (-) night sweats, (-) weakness Skin: (-) rashes, (-) itching,. Eyes: (-) visual changes, (-) redness, (-) itching. Nose and Sinuses: (-) nasal stuffiness or itchiness, (-) postnasal drip, (-) nosebleeds, (-) sinus trouble. Mouth and Throat: (-) sore throat, (-) hoarseness. Neck: (-) swollen glands, (-) enlarged thyroid, (-) neck pain. Respiratory: - cough, (-) bloody sputum, - shortness of breath, - wheezing. Cardiovascular: - ankle swelling, (-) chest pain. Lymphatic: (-) lymph node enlargement. Neurologic: (-) numbness, (-) tingling. Psychiatric: (-) anxiety, (-) depression   Current Medication: Outpatient Encounter Medications as of 07/20/2019  Medication Sig  . acetaminophen (TYLENOL) 500 MG tablet Take 1,000 mg by mouth every 6 (six) hours as needed for pain.  Marland Kitchen ALPRAZolam (XANAX) 0.25 MG tablet Take 1 tablet po QD PRN  . amoxicillin (AMOXIL) 875 MG tablet Take 1 tablet (875 mg total) by mouth 2 (two) times daily.  . chlorpheniramine-HYDROcodone (TUSSIONEX PENNKINETIC ER) 10-8 MG/5ML SUER Take 5 mLs by mouth every 12 (twelve) hours as needed for cough.  Marland Kitchen levonorgestrel (MIRENA) 20 MCG/24HR IUD 20 mcg by Intrauterine route.  Marland Kitchen losartan (COZAAR) 25 MG tablet Take 2 tablets (50 mg total) by mouth daily.  . meloxicam (MOBIC) 15 MG tablet Take 1 tablet (15 mg total) by mouth daily as needed.  . Multiple Vitamin (MULTIVITAMIN) tablet Take 1 tablet by mouth  daily.  Marland Kitchen SYNTHROID 25 MCG tablet Take 25 mcg by mouth daily.   Facility-Administered Encounter Medications as of 07/20/2019  Medication  . sodium chloride flush (NS) 0.9 % injection 3 mL    Surgical History: Past Surgical History:  Procedure Laterality Date  . RIGHT/LEFT HEART CATH AND CORONARY ANGIOGRAPHY Bilateral 05/26/2019   Procedure: RIGHT/LEFT HEART CATH AND CORONARY ANGIOGRAPHY;  Surgeon: Nelva Bush, MD;  Location: [facility];  Service: Cardiovascular;  Laterality: Bilateral;  . TUBAL LIGATION      Medical History: Past Medical History:  Diagnosis Date  . Anemia   . Arthritis   . Cardiomegaly   . Hypertension   . Pulmonary hypertension ([place])   . Thyroid disease     Family History: Family History  Problem Relation Age of Onset  . Cancer Other   . Breast cancer Maternal Grandmother   . Breast cancer Cousin        1st maternal cousin  . Heart attack Mother     Social History: Social History   Socioeconomic History  . Marital status: Single    Spouse name: Not on file  . Number of children: Not on file  . Years of education: Not on file  . Highest education level: Not on file  Occupational History  . Not on file  Tobacco Use  . Smoking status: Never Smoker  . Smokeless tobacco: Never Used  Substance and Sexual Activity  . Alcohol use: No  . Drug use: No  . Sexual activity: Yes    Birth control/protection: I.U.D.  Other Topics Concern  . Not on file  Social History Narrative  . Not on file   Social Determinants of Health   Financial Resource Strain:   . Difficulty of Paying Living Expenses:   Food Insecurity:   . Worried About Charity fundraiser in the Last Year:   . Arboriculturist in the Last Year:   Transportation Needs:   . Film/video editor (Medical):   Marland Kitchen Lack of Transportation (Non-Medical):   Physical Activity:   . Days of Exercise per Week:   . Minutes of Exercise per Session:   Stress:   . Feeling of Stress :    Social Connections:   . Frequency of Communication with Friends and Family:   . Frequency of Social Gatherings with Friends and Family:   . Attends Religious Services:   . Active Member of Clubs or Organizations:   . Attends Archivist Meetings:   Marland Kitchen Marital Status:   Intimate Partner Violence:   . Fear of Current or Ex-Partner:   . Emotionally Abused:   Marland Kitchen Physically Abused:   . Sexually Abused:     Vital Signs: Blood pressure (!) 155/87, pulse 82, temperature (!) 97.4 F (36.3 C), resp. rate 16, height 5\' 6"  (1.676 m), weight 292 lb (132.5 kg), SpO2 96 %.  Examination: General Appearance: The patient is well-developed, well-nourished, and in no distress. Skin: Gross inspection of skin unremarkable. Head: normocephalic, no gross deformities. Eyes: no gross deformities noted. ENT: ears appear grossly normal no exudates. Neck: Supple. No thyromegaly. No LAD. Respiratory: clear bilaterally. Cardiovascular: Normal S1 and S2 without murmur or rub. Extremities: No cyanosis. pulses are equal. Neurologic: Alert and oriented. No involuntary movements.  LABS: Recent Results (from the past 2160 hour(s))  Basic metabolic panel     Status: Abnormal   Collection Time: 05/23/19  9:15 AM  Result Value Ref Range   Glucose 103 (H) 65 - 99 mg/dL   BUN 10 6 - 24 mg/dL   Creatinine, Ser 0.73 0.57 - 1.00 mg/dL   GFR calc non Af Amer 91 >59 mL/min/1.73   GFR calc Af Amer 105 >59 mL/min/1.73   BUN/Creatinine Ratio 14 9 - 23   Sodium 141 134 - 144 mmol/L   Potassium 4.1 3.5 - 5.2 mmol/L   Chloride 106 96 - 106 mmol/L   CO2 24 20 - 29 mmol/L   Calcium 9.1 8.7 - 10.2 mg/dL  CBC with Differential/Platelet     Status: None   Collection Time: 05/23/19  9:15 AM  Result Value Ref Range   WBC 8.1 3.4 - 10.8 x10E3/uL   RBC 4.57 3.77 - 5.28 x10E6/uL   Hemoglobin 13.3 11.1 - 15.9 g/dL   Hematocrit 39.9 34.0 - 46.6 %   MCV 87 79 - 97 fL   MCH 29.1 26.6 - 33.0 pg   MCHC 33.3 31.5 - 35.7  g/dL   RDW 13.5 11.7 - 15.4 %   Platelets 373 150 - 450 x10E3/uL   Neutrophils 53 Not Estab. %   Lymphs 34 Not Estab. %   Monocytes 9 Not Estab. %   Eos 3 Not Estab. %   Basos 1 Not Estab. %   Neutrophils Absolute 4.2 1.4 - 7.0 x10E3/uL   Lymphocytes Absolute 2.8 0.7 - 3.1 x10E3/uL   Monocytes Absolute 0.7 0.1 - 0.9 x10E3/uL   EOS (ABSOLUTE) 0.3 0.0 - 0.4 x10E3/uL   Basophils Absolute 0.1 0.0 - 0.2 x10E3/uL   Immature Granulocytes  0 Not Estab. %   Immature Grans (Abs) 0.0 0.0 - 0.1 x10E3/uL  SARS CORONAVIRUS 2 (TAT 6-24 HRS) Nasopharyngeal Nasopharyngeal Swab     Status: None   Collection Time: 05/23/19 10:02 AM   Specimen: Nasopharyngeal Swab  Result Value Ref Range   SARS Coronavirus 2 NEGATIVE NEGATIVE    Comment: (NOTE) SARS-CoV-2 target nucleic acids are NOT DETECTED. The SARS-CoV-2 RNA is generally detectable in upper and lower respiratory specimens during the acute phase of infection. Negative results do not preclude SARS-CoV-2 infection, do not rule out co-infections with other pathogens, and should not be used as the sole basis for treatment or other patient management decisions. Negative results must be combined with clinical observations, patient history, and epidemiological information. The expected result is Negative. Fact Sheet for Patients: SugarRoll.be Fact Sheet for Healthcare Providers: https://www.woods-mathews.com/ This test is not yet approved or cleared by the [place] FDA and  has been authorized for detection and/or diagnosis of SARS-CoV-2 by FDA under an Emergency Use Authorization (EUA). This EUA will remain  in effect (meaning this test can be used) for the duration of the COVID-19 declaration under Section 56 4(b)(1) of the Act, 21 U.S.C. section 360bbb-3(b)(1), unless the authorization is terminated or revoked sooner. Performed at [facility], [place] [address] [place], [place] [address]    Urinalysis, Routine w reflex microscopic     Status: Abnormal   Collection Time: 06/19/19  8:27 AM  Result Value Ref Range   Specific Gravity, UA 1.016 1.005 - 1.030   pH, UA 6.0 5.0 - 7.5   Color, UA Yellow Yellow   Appearance Ur Cloudy (A) Clear   Leukocytes,UA 2+ (A) Negative   Protein,UA Negative Negative/Trace   Glucose, UA Negative Negative   Ketones, UA Negative Negative   RBC, UA Negative Negative   Bilirubin, UA Negative Negative   Urobilinogen, Ur 0.2 0.2 - 1.0 mg/dL   Nitrite, UA Negative Negative   Microscopic Examination See below:     Comment: Microscopic was indicated and was performed.  Microscopic Examination     Status: Abnormal   Collection Time: 06/19/19  8:27 AM   URINE  Result Value Ref Range   WBC, UA 0-5 0 - 5 /hpf   RBC 11-30 (A) 0 - 2 /hpf   Epithelial Cells (non renal) 0-10 0 - 10 /hpf   Casts None seen None seen /lpf   Bacteria, UA Many (A) None seen/Few  Urine Culture     Status: None   Collection Time: 06/19/19  8:27 AM   URINE  Result Value Ref Range   Urine Culture, Routine Final report    Organism ID, Bacteria Comment     Comment: Greater than 2 organisms recovered, none predominant. Please submit another sample if clinically indicated. Greater than 100,000 colony forming units per mL   Specimen status report     Status: None   Collection Time: 06/19/19  8:27 AM  Result Value Ref Range   specimen status report Comment     Comment: Written Authorization Written Authorization Written Authorization Received. Authorization received from [place] 06-22-2019 Logged by Vista Lawman     Radiology: No results found.  No results found.  No results found.    Assessment and Plan: Patient Active Problem List   Diagnosis Date Noted  . Encounter for general adult medical examination with abnormal findings 06/19/2019  . Encounter for screening mammogram for malignant neoplasm of breast 06/19/2019  . Pulmonary hypertension (  HCC)    . Shortness of breath   . Pulmonary hypertension, primary ([place]) 01/04/2019  . Acute pain of left shoulder 11/23/2018  . Cardiomegaly 11/23/2018  . Abnormal ECG 11/23/2018  . Acquired hypothyroidism 05/22/2018  . Seasonal allergies 05/22/2018  . Acute anxiety 05/22/2018  . Dysuria 05/22/2018  . Hypertension 01/05/2018  . Routine cervical smear 01/05/2018  . Elevated ferritin 11/26/2015  . Thrombocytosis ([place]) 11/26/2015    1. Pulmonary hypertension, primary ([place]) Stable, Heart Cath confirmed present of PAH.  Continue to see Cardiology as scheduled.   2. Essential hypertension Slightly elevated today 155/87, no symptoms, continue present management.   3. Nocturnal hypoxia Continue on 2 LPM of oxygen at night.   4. Cough Use bonzonatate during the day, and use tussionex as needed at bedtime.  - chlorpheniramine-HYDROcodone (TUSSIONEX PENNKINETIC ER) 10-8 MG/5ML SUER; Take 5 mLs by mouth every 12 (twelve) hours as needed for cough.  Dispense: 115 mL; Refill: 0 - benzonatate (TESSALON) 100 MG capsule; Take 1 capsule (100 mg total) by mouth 2 (two) times daily as needed for cough.  Dispense: 20 capsule; Refill: 0 Sample of [place] given  5. BMI 45.0-49.9, adult (HCC) Obesity Counseling: Risk Assessment: An assessment of behavioral risk factors was made today and includes lack of exercise sedentary lifestyle, lack of portion control and poor dietary habits.  Risk Modification Advice: She was counseled on portion control guidelines. Restricting daily caloric intake to 1800. The detrimental long term effects of obesity on her health and ongoing poor compliance was also discussed with the patient.    6. Morbid obesity ([place]) BMI over 45  General Counseling: I have discussed the findings of the evaluation and examination with Arneta.  I have also discussed any further diagnostic evaluation thatmay be needed or ordered today. Jalaiya verbalizes understanding of the findings of todays  visit. We also reviewed her medications today and discussed drug interactions and side effects including but not limited excessive drowsiness and altered mental states. We also discussed that there is always a risk not just to her but also people around her. she has been encouraged to call the office with any questions or concerns that should arise related to todays visit.  No orders of the defined types were placed in this encounter.    Time spent: 30 This patient was seen by Orson Gear AGNP-C in Collaboration with Dr. Devona Konig as a part of collaborative care agreement.   I have personally obtained a history, examined the patient, evaluated laboratory and imaging results, formulated the assessment and plan and placed orders.    Allyne Gee, MD [facility] Pulmonary and Critical Care Sleep medicine

## 2019-08-18 ENCOUNTER — Telehealth: Payer: Self-pay

## 2019-08-18 NOTE — Telephone Encounter (Signed)
Confirmed appointment on 08/22/2019 and screened for covid. klh  

## 2019-08-22 ENCOUNTER — Encounter: Payer: Self-pay | Admitting: Internal Medicine

## 2019-08-22 ENCOUNTER — Ambulatory Visit: Payer: No Typology Code available for payment source | Admitting: Internal Medicine

## 2019-08-22 ENCOUNTER — Other Ambulatory Visit: Payer: Self-pay

## 2019-08-22 VITALS — BP 144/74 | HR 74 | Temp 97.6°F | Resp 16 | Ht 66.0 in | Wt 290.0 lb

## 2019-08-22 DIAGNOSIS — I10 Essential (primary) hypertension: Secondary | ICD-10-CM

## 2019-08-22 DIAGNOSIS — I270 Primary pulmonary hypertension: Secondary | ICD-10-CM

## 2019-08-22 DIAGNOSIS — G4734 Idiopathic sleep related nonobstructive alveolar hypoventilation: Secondary | ICD-10-CM | POA: Diagnosis not present

## 2019-08-22 DIAGNOSIS — Z6841 Body Mass Index (BMI) 40.0 and over, adult: Secondary | ICD-10-CM

## 2019-08-22 DIAGNOSIS — R059 Cough, unspecified: Secondary | ICD-10-CM

## 2019-08-22 DIAGNOSIS — R05 Cough: Secondary | ICD-10-CM | POA: Diagnosis not present

## 2019-08-22 MED ORDER — BENZONATATE 100 MG PO CAPS: 100.0000 mg | ORAL_CAPSULE | Freq: Two times a day (BID) | ORAL | 0 refills | Status: DC | PRN

## 2019-08-22 NOTE — Progress Notes (Signed)
[facility] [place], [place] [address]  Pulmonary Sleep Medicine   Office Visit Note  Patient Name: Shelley [name] DOB: 11-22-1960 MRN [id number]  Date of Service: 08/22/2019  Complaints/HPI: Pt is here  For pulmonary follow up.  She reports her sob has improved since our last visit.  She has been using the tessalon with good results.  She reports her cough resolved, but returned a week or so later.  She denies productivity at this time. Her blood pressure is slightly elevated today 144/74.  She Denies Chest pain, Shortness of breath, palpitations, headache, or blurred vision.  She continue to wear 2 Liters of oxygen at night for hypoxia.    ROS  General: (-) fever, (-) chills, (-) night sweats, (-) weakness Skin: (-) rashes, (-) itching,. Eyes: (-) visual changes, (-) redness, (-) itching. Nose and Sinuses: (-) nasal stuffiness or itchiness, (-) postnasal drip, (-) nosebleeds, (-) sinus trouble. Mouth and Throat: (-) sore throat, (-) hoarseness. Neck: (-) swollen glands, (-) enlarged thyroid, (-) neck pain. Respiratory: - cough, (-) bloody sputum, - shortness of breath, - wheezing. Cardiovascular: - ankle swelling, (-) chest pain. Lymphatic: (-) lymph node enlargement. Neurologic: (-) numbness, (-) tingling. Psychiatric: (-) anxiety, (-) depression   Current Medication: Outpatient Encounter Medications as of 08/22/2019  Medication Sig  . acetaminophen (TYLENOL) 500 MG tablet Take 1,000 mg by mouth every 6 (six) hours as needed for pain.  Marland Kitchen ALPRAZolam (XANAX) 0.25 MG tablet Take 1 tablet po QD PRN  . amoxicillin (AMOXIL) 875 MG tablet Take 1 tablet (875 mg total) by mouth 2 (two) times daily.  . chlorpheniramine-HYDROcodone (TUSSIONEX PENNKINETIC ER) 10-8 MG/5ML SUER Take 5 mLs by mouth every 12 (twelve) hours as needed for cough.  Marland Kitchen levonorgestrel (MIRENA) 20 MCG/24HR IUD 20 mcg by Intrauterine route.  Marland Kitchen losartan (COZAAR) 25 MG tablet Take 2 tablets  (50 mg total) by mouth daily.  . meloxicam (MOBIC) 15 MG tablet Take 1 tablet (15 mg total) by mouth daily as needed.  . Multiple Vitamin (MULTIVITAMIN) tablet Take 1 tablet by mouth daily.  Marland Kitchen SYNTHROID 25 MCG tablet Take 25 mcg by mouth daily.  . [DISCONTINUED] benzonatate (TESSALON) 100 MG capsule Take 1 capsule (100 mg total) by mouth 2 (two) times daily as needed for cough.  . benzonatate (TESSALON) 100 MG capsule Take 1 capsule (100 mg total) by mouth 2 (two) times daily as needed for cough.   Facility-Administered Encounter Medications as of 08/22/2019  Medication  . sodium chloride flush (NS) 0.9 % injection 3 mL    Surgical History: Past Surgical History:  Procedure Laterality Date  . RIGHT/LEFT HEART CATH AND CORONARY ANGIOGRAPHY Bilateral 05/26/2019   Procedure: RIGHT/LEFT HEART CATH AND CORONARY ANGIOGRAPHY;  Surgeon: Nelva Bush, MD;  Location: [facility];  Service: Cardiovascular;  Laterality: Bilateral;  . TUBAL LIGATION      Medical History: Past Medical History:  Diagnosis Date  . Anemia   . Arthritis   . Cardiomegaly   . Hypertension   . Pulmonary hypertension ([place])   . Thyroid disease     Family History: Family History  Problem Relation Age of Onset  . Cancer Other   . Breast cancer Maternal Grandmother   . Breast cancer Cousin        1st maternal cousin  . Heart attack Mother     Social History: Social History   Socioeconomic History  . Marital status: Single    Spouse name:  Not on file  . Number of children: Not on file  . Years of education: Not on file  . Highest education level: Not on file  Occupational History  . Not on file  Tobacco Use  . Smoking status: Never Smoker  . Smokeless tobacco: Never Used  Vaping Use  . Vaping Use: Never used  Substance and Sexual Activity  . Alcohol use: No  . Drug use: No  . Sexual activity: Yes    Birth control/protection: I.U.D.  Other Topics Concern  . Not on file  Social History  Narrative  . Not on file   Social Determinants of Health   Financial Resource Strain:   . Difficulty of Paying Living Expenses:   Food Insecurity:   . Worried About Charity fundraiser in the Last Year:   . Arboriculturist in the Last Year:   Transportation Needs:   . Film/video editor (Medical):   Marland Kitchen Lack of Transportation (Non-Medical):   Physical Activity:   . Days of Exercise per Week:   . Minutes of Exercise per Session:   Stress:   . Feeling of Stress :   Social Connections:   . Frequency of Communication with Friends and Family:   . Frequency of Social Gatherings with Friends and Family:   . Attends Religious Services:   . Active Member of Clubs or Organizations:   . Attends Archivist Meetings:   Marland Kitchen Marital Status:   Intimate Partner Violence:   . Fear of Current or Ex-Partner:   . Emotionally Abused:   Marland Kitchen Physically Abused:   . Sexually Abused:     Vital Signs: Blood pressure (!) 144/74, pulse 74, temperature 97.6 F (36.4 C), resp. rate 16, height 5\' 6"  (1.676 m), weight 290 lb (131.5 kg), SpO2 96 %.  Examination: General Appearance: The patient is well-developed, well-nourished, and in no distress. Skin: Gross inspection of skin unremarkable. Head: normocephalic, no gross deformities. Eyes: no gross deformities noted. ENT: ears appear grossly normal no exudates. Neck: Supple. No thyromegaly. No LAD. Respiratory: clear bilaterally. Cardiovascular: Normal S1 and S2 without murmur or rub. Extremities: No cyanosis. pulses are equal. Neurologic: Alert and oriented. No involuntary movements.  LABS: Recent Results (from the past 2160 hour(s))  Urinalysis, Routine w reflex microscopic     Status: Abnormal   Collection Time: 06/19/19  8:27 AM  Result Value Ref Range   Specific Gravity, UA 1.016 1.005 - 1.030   pH, UA 6.0 5.0 - 7.5   Color, UA Yellow Yellow   Appearance Ur Cloudy (A) Clear   Leukocytes,UA 2+ (A) Negative   Protein,UA Negative  Negative/Trace   Glucose, UA Negative Negative   Ketones, UA Negative Negative   RBC, UA Negative Negative   Bilirubin, UA Negative Negative   Urobilinogen, Ur 0.2 0.2 - 1.0 mg/dL   Nitrite, UA Negative Negative   Microscopic Examination See below:     Comment: Microscopic was indicated and was performed.  Microscopic Examination     Status: Abnormal   Collection Time: 06/19/19  8:27 AM   URINE  Result Value Ref Range   WBC, UA 0-5 0 - 5 /hpf   RBC 11-30 (A) 0 - 2 /hpf   Epithelial Cells (non renal) 0-10 0 - 10 /hpf   Casts None seen None seen /lpf   Bacteria, UA Many (A) None seen/Few  Urine Culture     Status: None   Collection Time: 06/19/19  8:27 AM  URINE  Result Value Ref Range   Urine Culture, Routine Final report    Organism ID, Bacteria Comment     Comment: Greater than 2 organisms recovered, none predominant. Please submit another sample if clinically indicated. Greater than 100,000 colony forming units per mL   Specimen status report     Status: None   Collection Time: 06/19/19  8:27 AM  Result Value Ref Range   specimen status report Comment     Comment: Written Authorization Written Authorization Written Authorization Received. Authorization received from [place] 06-22-2019 Logged by Vista Lawman     Radiology: No results found.  No results found.  No results found.    Assessment and Plan: Patient Active Problem List   Diagnosis Date Noted  . Encounter for general adult medical examination with abnormal findings 06/19/2019  . Encounter for screening mammogram for malignant neoplasm of breast 06/19/2019  . Pulmonary hypertension ([place])   . Shortness of breath   . Pulmonary hypertension, primary ([place]) 01/04/2019  . Acute pain of left shoulder 11/23/2018  . Cardiomegaly 11/23/2018  . Abnormal ECG 11/23/2018  . Acquired hypothyroidism 05/22/2018  . Seasonal allergies 05/22/2018  . Acute anxiety 05/22/2018  . Dysuria 05/22/2018  .  Hypertension 01/05/2018  . Routine cervical smear 01/05/2018  . Elevated ferritin 11/26/2015  . Thrombocytosis ([place]) 11/26/2015    1. Nocturnal hypoxia Continue to use oxygen 2 lpm at night. No current symptoms.    2. Cough Continue with tessalon as needed.  Follow up in office if fever, or productive cough develops.  3. Essential hypertension Controlled, continue current management.   4. Pulmonary hypertension, primary ([place]) Continue to see cardiology as directed.   5. BMI 45.0-49.9, adult (HCC) Obesity Counseling: Risk Assessment: An assessment of behavioral risk factors was made today and includes lack of exercise sedentary lifestyle, lack of portion control and poor dietary habits.  Risk Modification Advice: She was counseled on portion control guidelines. Restricting daily caloric intake to 1800. The detrimental long term effects of obesity on her health and ongoing poor compliance was also discussed with the patient.    General Counseling: I have discussed the findings of the evaluation and examination with Shelley [name].  I have also discussed any further diagnostic evaluation thatmay be needed or ordered today. Shelley [name] verbalizes understanding of the findings of todays visit. We also reviewed her medications today and discussed drug interactions and side effects including but not limited excessive drowsiness and altered mental states. We also discussed that there is always a risk not just to her but also people around her. she has been encouraged to call the office with any questions or concerns that should arise related to todays visit.  No orders of the defined types were placed in this encounter.    Time spent: 25 This patient was seen by Orson Gear AGNP-C in Collaboration with Dr. Devona Konig as a part of collaborative care agreement.   I have personally obtained a history, examined the patient, evaluated laboratory and imaging results, formulated the assessment and plan  and placed orders.    Allyne Gee, MD [facility] Pulmonary and Critical Care Sleep medicine

## 2019-10-17 ENCOUNTER — Telehealth: Payer: Self-pay

## 2019-10-17 NOTE — Telephone Encounter (Signed)
Confirmed and screened for 10-19-19 ov. 

## 2019-10-19 ENCOUNTER — Ambulatory Visit: Payer: 59 | Admitting: Nurse Practitioner

## 2019-11-23 ENCOUNTER — Telehealth: Payer: Self-pay

## 2019-11-23 NOTE — Telephone Encounter (Signed)
CONFIRMED PT APPT 11/27/19. BR 

## 2019-11-27 ENCOUNTER — Other Ambulatory Visit: Payer: Self-pay

## 2019-11-27 ENCOUNTER — Ambulatory Visit: Payer: No Typology Code available for payment source | Admitting: Internal Medicine

## 2019-11-27 ENCOUNTER — Ambulatory Visit
Admission: RE | Admit: 2019-11-27 | Discharge: 2019-11-27 | Disposition: A | Discharge status: Home / Self Care | Payer: No Typology Code available for payment source | Source: Ambulatory Visit | Attending: Internal Medicine | Admitting: Internal Medicine

## 2019-11-27 ENCOUNTER — Encounter: Payer: Self-pay | Admitting: Internal Medicine

## 2019-11-27 VITALS — BP 140/90 | HR 75 | Temp 97.4°F | Resp 16 | Ht 66.0 in | Wt 290.2 lb

## 2019-11-27 DIAGNOSIS — Z6841 Body Mass Index (BMI) 40.0 and over, adult: Secondary | ICD-10-CM

## 2019-11-27 DIAGNOSIS — G4734 Idiopathic sleep related nonobstructive alveolar hypoventilation: Secondary | ICD-10-CM | POA: Diagnosis not present

## 2019-11-27 DIAGNOSIS — U071 COVID-19: Secondary | ICD-10-CM | POA: Diagnosis present

## 2019-11-27 DIAGNOSIS — I270 Primary pulmonary hypertension: Secondary | ICD-10-CM | POA: Diagnosis not present

## 2019-11-27 NOTE — Progress Notes (Signed)
[facility] [place], [place] [address]  Pulmonary Sleep Medicine   Office Visit Note  Patient Name: Shelley [name] DOB: Apr 14, 1960 MRN [id number]  Date of Service: 11/27/2019  Complaints/HPI: She is doing well overall. She is using her oxygen every night. Patient states that she has been gaining benefit from the o2. She has no less SOB noted. She had a sleep study done last novemebr did not show significant OSA. She was diagnosed with Covid in April. Needs a follow up CXR done. Needs PFT done post covid  ROS  General: (-) fever, (-) chills, (-) night sweats, (-) weakness Skin: (-) rashes, (-) itching,. Eyes: (-) visual changes, (-) redness, (-) itching. Nose and Sinuses: (-) nasal stuffiness or itchiness, (-) postnasal drip, (-) nosebleeds, (-) sinus trouble. Mouth and Throat: (-) sore throat, (-) hoarseness. Neck: (-) swollen glands, (-) enlarged thyroid, (-) neck pain. Respiratory: - cough, (-) bloody sputum, + shortness of breath, - wheezing. Cardiovascular: - ankle swelling, (-) chest pain. Lymphatic: (-) lymph node enlargement. Neurologic: (-) numbness, (-) tingling. Psychiatric: (-) anxiety, (-) depression   Current Medication: Outpatient Encounter Medications as of 11/27/2019  Medication Sig  . acetaminophen (TYLENOL) 500 MG tablet Take 1,000 mg by mouth every 6 (six) hours as needed for pain.  Marland Kitchen ALPRAZolam (XANAX) 0.25 MG tablet Take 1 tablet po QD PRN  . benzonatate (TESSALON) 100 MG capsule Take 1 capsule (100 mg total) by mouth 2 (two) times daily as needed for cough.  . chlorpheniramine-HYDROcodone (TUSSIONEX PENNKINETIC ER) 10-8 MG/5ML SUER Take 5 mLs by mouth every 12 (twelve) hours as needed for cough.  Marland Kitchen levonorgestrel (MIRENA) 20 MCG/24HR IUD 20 mcg by Intrauterine route.  Marland Kitchen losartan (COZAAR) 25 MG tablet Take 2 tablets (50 mg total) by mouth daily.  . meloxicam (MOBIC) 15 MG tablet Take 1 tablet (15 mg total) by mouth daily as  needed.  . Multiple Vitamin (MULTIVITAMIN) tablet Take 1 tablet by mouth daily.  Marland Kitchen SYNTHROID 25 MCG tablet Take 25 mcg by mouth daily.  . [DISCONTINUED] amoxicillin (AMOXIL) 875 MG tablet Take 1 tablet (875 mg total) by mouth 2 (two) times daily.   Facility-Administered Encounter Medications as of 11/27/2019  Medication  . sodium chloride flush (NS) 0.9 % injection 3 mL    Surgical History: Past Surgical History:  Procedure Laterality Date  . RIGHT/LEFT HEART CATH AND CORONARY ANGIOGRAPHY Bilateral 05/26/2019   Procedure: RIGHT/LEFT HEART CATH AND CORONARY ANGIOGRAPHY;  Surgeon: Nelva Bush, MD;  Location: [facility];  Service: Cardiovascular;  Laterality: Bilateral;  . TUBAL LIGATION      Medical History: Past Medical History:  Diagnosis Date  . Anemia   . Arthritis   . Cardiomegaly   . Hypertension   . Pulmonary hypertension ([place])   . Thyroid disease     Family History: Family History  Problem Relation Age of Onset  . Cancer Other   . Breast cancer Maternal Grandmother   . Breast cancer Cousin        1st maternal cousin  . Heart attack Mother     Social History: Social History   Socioeconomic History  . Marital status: Single    Spouse name: Not on file  . Number of children: Not on file  . Years of education: Not on file  . Highest education level: Not on file  Occupational History  . Not on file  Tobacco Use  . Smoking status: Never Smoker  . Smokeless tobacco:  Never Used  Vaping Use  . Vaping Use: Never used  Substance and Sexual Activity  . Alcohol use: No  . Drug use: No  . Sexual activity: Yes    Birth control/protection: I.U.D.  Other Topics Concern  . Not on file  Social History Narrative  . Not on file   Social Determinants of Health   Financial Resource Strain:   . Difficulty of Paying Living Expenses: Not on file  Food Insecurity:   . Worried About Charity fundraiser in the Last Year: Not on file  . Ran Out of Food in  the Last Year: Not on file  Transportation Needs:   . Lack of Transportation (Medical): Not on file  . Lack of Transportation (Non-Medical): Not on file  Physical Activity:   . Days of Exercise per Week: Not on file  . Minutes of Exercise per Session: Not on file  Stress:   . Feeling of Stress : Not on file  Social Connections:   . Frequency of Communication with Friends and Family: Not on file  . Frequency of Social Gatherings with Friends and Family: Not on file  . Attends Religious Services: Not on file  . Active Member of Clubs or Organizations: Not on file  . Attends Archivist Meetings: Not on file  . Marital Status: Not on file  Intimate Partner Violence:   . Fear of Current or Ex-Partner: Not on file  . Emotionally Abused: Not on file  . Physically Abused: Not on file  . Sexually Abused: Not on file    Vital Signs: Blood pressure 140/90, pulse 75, temperature (!) 97.4 F (36.3 C), resp. rate 16, height 5\' 6"  (1.676 m), weight 290 lb 3.2 oz (131.6 kg), SpO2 97 %.  Examination: General Appearance: The patient is well-developed, well-nourished, and in no distress. Skin: Gross inspection of skin unremarkable. Head: normocephalic, no gross deformities. Eyes: no gross deformities noted. ENT: ears appear grossly normal no exudates. Neck: Supple. No thyromegaly. No LAD. Respiratory: few rhonchi noted. Cardiovascular: Normal S1 and S2 without murmur or rub. Extremities: No cyanosis. pulses are equal. Neurologic: Alert and oriented. No involuntary movements.  LABS: No results found for this or any previous visit (from the past 2160 hour(s)).  Radiology: No results found.  No results found.  No results found.    Assessment and Plan: Patient Active Problem List   Diagnosis Date Noted  . Encounter for general adult medical examination with abnormal findings 06/19/2019  . Encounter for screening mammogram for malignant neoplasm of breast 06/19/2019  .  Pulmonary hypertension ([place])   . Shortness of breath   . Pulmonary hypertension, primary ([place]) 01/04/2019  . Acute pain of left shoulder 11/23/2018  . Cardiomegaly 11/23/2018  . Abnormal ECG 11/23/2018  . Acquired hypothyroidism 05/22/2018  . Seasonal allergies 05/22/2018  . Acute anxiety 05/22/2018  . Dysuria 05/22/2018  . Hypertension 01/05/2018  . Routine cervical smear 01/05/2018  . Elevated ferritin 11/26/2015  . Thrombocytosis ([place]) 11/26/2015    1. Nocturnal Hypoxia she is on oxygen does fine with it. No OSA noted based on her last sleep study 2. Covid 19 will shcedule a follow up CXR and also f/u PFT 3. Pulmonary HTN follow up echo ordered she had mild to moderate PAH 4. Obesity encourage weight loss with diet and exercise  General Counseling: I have discussed the findings of the evaluation and examination with Daron.  I have also discussed any further diagnostic evaluation thatmay be needed  or ordered today. Armetta verbalizes understanding of the findings of todays visit. We also reviewed her medications today and discussed drug interactions and side effects including but not limited excessive drowsiness and altered mental states. We also discussed that there is always a risk not just to her but also people around her. she has been encouraged to call the office with any questions or concerns that should arise related to todays visit.  Orders Placed This Encounter  Procedures  . DG Chest 2 View    Standing Status:   Future    Standing Expiration Date:   11/26/2020    Order Specific Question:   Reason for Exam (SYMPTOM  OR DIAGNOSIS REQUIRED)    Answer:   post covid    Order Specific Question:   Is patient pregnant?    Answer:   No    Order Specific Question:   Preferred imaging location?    Answer:   [facility]    Order Specific Question:   Radiology Contrast Protocol - do NOT remove file path    Answer:    [email or web address]  . ECHOCARDIOGRAM COMPLETE    Standing Status:   Future    Standing Expiration Date:   11/26/2020    Order Specific Question:   Where should this test be performed    Answer:   External    Order Specific Question:   Perflutren DEFINITY (image enhancing agent) should be administered unless hypersensitivity or allergy exist    Answer:   Administer Perflutren    Order Specific Question:   Is a special reader required? (athlete or structural heart)    Answer:   No    Order Specific Question:   Does this study need to be read by the Structural team/Level 3 readers?    Answer:   No    Order Specific Question:   Reason for exam-Echo    Answer:   Pulmonary hypertension  416.8 / I27.2     Time spent: 17  I have personally obtained a history, examined the patient, evaluated laboratory and imaging results, formulated the assessment and plan and placed orders.    Allyne Gee, MD [facility] Pulmonary and Critical Care Sleep medicine

## 2019-11-27 NOTE — Patient Instructions (Signed)
Echocardiogram An echocardiogram is a procedure that uses painless sound waves (ultrasound) to produce an image of the heart. Images from an echocardiogram can provide important information about:  Signs of coronary artery disease (CAD).  Aneurysm detection. An aneurysm is a weak or damaged part of an artery wall that bulges out from the normal force of blood pumping through the body.  Heart size and shape. Changes in the size or shape of the heart can be associated with certain conditions, including heart failure, aneurysm, and CAD.  Heart muscle function.  Heart valve function.  Signs of a past heart attack.  Fluid buildup around the heart.  Thickening of the heart muscle.  A tumor or infectious growth around the heart valves. Tell a health care provider about:  Any allergies you have.  All medicines you are taking, including vitamins, herbs, eye drops, creams, and over-the-counter medicines.  Any blood disorders you have.  Any surgeries you have had.  Any medical conditions you have.  Whether you are pregnant or may be pregnant. What are the risks? Generally, this is a safe procedure. However, problems may occur, including:  Allergic reaction to dye (contrast) that may be used during the procedure. What happens before the procedure? No specific preparation is needed. You may eat and drink normally. What happens during the procedure?   An IV tube may be inserted into one of your veins.  You may receive contrast through this tube. A contrast is an injection that improves the quality of the pictures from your heart.  A gel will be applied to your chest.  A wand-like tool (transducer) will be moved over your chest. The gel will help to transmit the sound waves from the transducer.  The sound waves will harmlessly bounce off of your heart to allow the heart images to be captured in real-time motion. The images will be recorded on a computer. The procedure may vary  among health care providers and hospitals. What happens after the procedure?  You may return to your normal, everyday life, including diet, activities, and medicines, unless your health care provider tells you not to do that. Summary  An echocardiogram is a procedure that uses painless sound waves (ultrasound) to produce an image of the heart.  Images from an echocardiogram can provide important information about the size and shape of your heart, heart muscle function, heart valve function, and fluid buildup around your heart.  You do not need to do anything to prepare before this procedure. You may eat and drink normally.  After the echocardiogram is completed, you may return to your normal, everyday life, unless your health care provider tells you not to do that. This information is not intended to replace advice given to you by your health care provider. Make sure you discuss any questions you have with your health care provider. Document Revised: 06/16/2018 Document Reviewed: 03/28/2016 Elsevier Patient Education  2020 Elsevier Inc.  

## 2019-11-28 ENCOUNTER — Ambulatory Visit: Payer: No Typology Code available for payment source | Admitting: Internal Medicine

## 2019-12-05 ENCOUNTER — Telehealth: Payer: Self-pay

## 2019-12-05 NOTE — Telephone Encounter (Signed)
Printed requested documents for patient to pick up stating her PFT shows within normal limits and her last appointments showing night oxygen is helping with breathing. Shelley [name]

## 2019-12-08 ENCOUNTER — Other Ambulatory Visit: Payer: Self-pay

## 2019-12-08 ENCOUNTER — Encounter: Payer: Self-pay | Admitting: Cardiology

## 2019-12-08 ENCOUNTER — Ambulatory Visit (INDEPENDENT_AMBULATORY_CARE_PROVIDER_SITE_OTHER): Payer: No Typology Code available for payment source | Admitting: Cardiology

## 2019-12-08 VITALS — BP 130/82 | HR 70 | Ht 66.0 in | Wt 288.2 lb

## 2019-12-08 DIAGNOSIS — I272 Pulmonary hypertension, unspecified: Secondary | ICD-10-CM

## 2019-12-08 DIAGNOSIS — I10 Essential (primary) hypertension: Secondary | ICD-10-CM

## 2019-12-08 NOTE — Progress Notes (Signed)
Cardiology Office Note:    Date:  12/08/2019   ID:  Shelley [name], DOB [birth year], MRN [id number]  PCP:  Ronnell Freshwater, NP  Cardiologist:  Kate Sable, MD  Electrophysiologist:  None   Referring MD: Ronnell Freshwater, NP   Chief Complaint  Patient presents with  . other    6 month follow up. Meds reviewed by the pt. verbally. "doing well."      History of Present Illness:    Shelley [name] is a 59 y.o. female with a hx of hypertension, obesity who presents for follow-up.  Previously seen for elevated pulmonary pressures on echocardiogram.  Work-up with right heart cath showed mildly elevated pulmonary pressures, mean wedge pressure of 14.  She was advised on weight loss, exercise, BP management.  She has since seen pulmonary medicine where sleep study showed hypoxia during sleep.  Started on oxygen with good effects.  She is working on losing weight, lost about 2 pounds since last visit.  Otherwise feels well, has no concerns at this time.  Prior notes Outside echo date 11/25/2018 (report scanned in epic) briefly showed normal LVEF with EF 46%, normal diastolic function, RVSP of 54 (assumed RA pressure of 10), mild TR. she had an exercise treadmill stress test which was normal.  She also endorses snoring when she sleeps.  Had a sleep study which showed hypoxia during sleep.  She was started on oxygen by pulmonary medicine.  Right heart cath obtained 05/2019 showed wedge pressure of 14, mildly elevated pulmonary artery systolic pressure of 35.    Past Medical History:  Diagnosis Date  . Anemia   . Arthritis   . Cardiomegaly   . Hypertension   . Pulmonary hypertension ([place])   . Thyroid disease     Past Surgical History:  Procedure Laterality Date  . RIGHT/LEFT HEART CATH AND CORONARY ANGIOGRAPHY Bilateral 05/26/2019   Procedure: RIGHT/LEFT HEART CATH AND CORONARY ANGIOGRAPHY;  Surgeon: Nelva Bush, MD;  Location: [facility];  Service:  Cardiovascular;  Laterality: Bilateral;  . TUBAL LIGATION      Current Medications: Current Meds  Medication Sig  . acetaminophen (TYLENOL) 500 MG tablet Take 1,000 mg by mouth every 6 (six) hours as needed for pain.  Marland Kitchen ALPRAZolam (XANAX) 0.25 MG tablet Take 1 tablet po QD PRN  . levonorgestrel (MIRENA) 20 MCG/24HR IUD 20 mcg by Intrauterine route.  Marland Kitchen losartan (COZAAR) 25 MG tablet Take 2 tablets (50 mg total) by mouth daily.  . meloxicam (MOBIC) 15 MG tablet Take 1 tablet (15 mg total) by mouth daily as needed.  . Multiple Vitamin (MULTIVITAMIN) tablet Take 1 tablet by mouth daily.  Marland Kitchen SYNTHROID 25 MCG tablet Take 25 mcg by mouth daily.   Current Facility-Administered Medications for the 12/08/19 encounter (Office Visit) with Kate Sable, MD  Medication  . sodium chloride flush (NS) 0.9 % injection 3 mL     Allergies:   Amlodipine   Social History   Socioeconomic History  . Marital status: Single    Spouse name: Not on file  . Number of children: Not on file  . Years of education: Not on file  . Highest education level: Not on file  Occupational History  . Not on file  Tobacco Use  . Smoking status: Never Smoker  . Smokeless tobacco: Never Used  Vaping Use  . Vaping Use: Never used  Substance and Sexual Activity  . Alcohol use: No  . Drug use: No  .  Sexual activity: Yes    Birth control/protection: I.U.D.  Other Topics Concern  . Not on file  Social History Narrative  . Not on file   Social Determinants of Health   Financial Resource Strain:   . Difficulty of Paying Living Expenses: Not on file  Food Insecurity:   . Worried About Charity fundraiser in the Last Year: Not on file  . Ran Out of Food in the Last Year: Not on file  Transportation Needs:   . Lack of Transportation (Medical): Not on file  . Lack of Transportation (Non-Medical): Not on file  Physical Activity:   . Days of Exercise per Week: Not on file  . Minutes of Exercise per Session: Not  on file  Stress:   . Feeling of Stress : Not on file  Social Connections:   . Frequency of Communication with Friends and Family: Not on file  . Frequency of Social Gatherings with Friends and Family: Not on file  . Attends Religious Services: Not on file  . Active Member of Clubs or Organizations: Not on file  . Attends Archivist Meetings: Not on file  . Marital Status: Not on file     Family History: The patient's family history includes Breast cancer in her cousin and maternal grandmother; Cancer in an other family member; Heart attack in her mother.  ROS:   Please see the history of present illness.     All other systems reviewed and are negative.  EKGs/Labs/Other Studies Reviewed:    The following studies were reviewed today: Right and left heart cath, date 05/26/2019 1. No angiographically significant coronary artery disease involving the main coronary arteries.  There is tapering of the distal LAD that likely reflects a normal variant though mild to moderate diffuse disease cannot be excluded. 2. Upper normal to mildly elevated left heart, right heart, and pulmonary artery pressures. 3. Normal Fick cardiac output/index.  Right Heart Pressures RA (mean): 10 mmHg RV (S/EDP): 35/11 mmHg PA (S/D, mean): 35/17 (23) mmHg PCWP (mean): 14 mmHg  Ao sat: 96% PA sat: 71%  Fick CO: 7.4 L/min Fick CI: 3.2 L/min/m^2     EKG:  EKG is  ordered today.  The ekg ordered today demonstrates sinus rhythm  Recent Labs: 02/19/2019: ALT 17 05/23/2019: BUN 10; Creatinine, Ser 0.73; Hemoglobin 13.3; Platelets 373; Potassium 4.1; Sodium 141  Recent Lipid Panel No results found for: CHOL, TRIG, HDL, CHOLHDL, VLDL, LDLCALC, LDLDIRECT  Physical Exam:    VS:  BP 130/82 (BP Location: Left Arm, Patient Position: Sitting, Cuff Size: Large)   Pulse 70   Ht 5\' 6"  (1.676 m)   Wt 288 lb 4 oz (130.7 kg)   SpO2 98%   BMI 46.52 kg/m     Wt Readings from Last 3 Encounters:  12/08/19  288 lb 4 oz (130.7 kg)  11/27/19 290 lb 3.2 oz (131.6 kg)  08/22/19 290 lb (131.5 kg)     GEN:  Well nourished, well developed in no acute distress HEENT: Normal NECK: No JVD; No carotid bruits LYMPHATICS: No lymphadenopathy CARDIAC: RRR, no murmurs, rubs, gallops RESPIRATORY:  Clear to auscultation without rales, wheezing or rhonchi  ABDOMEN: Soft, non-tender, non-distended MUSCULOSKELETAL:  No edema; No deformity  SKIN: Warm and dry NEUROLOGIC:  Alert and oriented x 3 PSYCHIATRIC:  Normal affect   ASSESSMENT:    1. Essential hypertension   2. Morbid obesity ([place])   3. Pulmonary hypertension ([place])    PLAN:  In order of problems listed above:  1. Patient with history of hypertension.  Blood pressure well controlled.  Continue losartan. 2. Patient is morbidly obese.  Low calorie diet, weight loss advised.  Congratulated on losing some weight since last visit.  Encouraged to continue. 3. Mild pulmonary hypertension noted on right heart cath, PASP 35, wedge pressure of 14 consistent with precapillary pressure.  Obesity likely contributing weight loss, diet, exercise as above will go a long way to help patient.  Follow-up 1 year  Total encounter time 35 minutes  Greater than 50% was spent in counseling and coordination of care with the patient Plan discussed with patient and daughter in the room.   This note was generated in part or whole with voice recognition software. Voice recognition is usually quite accurate but there are transcription errors that can and very often do occur. I apologize for any typographical errors that were not detected and corrected.  Medication Adjustments/Labs and Tests Ordered: Current medicines are reviewed at length with the patient today.  Concerns regarding medicines are outlined above.  Orders Placed This Encounter  Procedures  . EKG 12-Lead   No orders of the defined types were placed in this encounter.   Patient Instructions    Medication Instructions:  NO changes *If you need a refill on your cardiac medications before your next appointment, please call your pharmacy*   Lab Work: No Changes If you have labs (blood work) drawn today and your tests are completely normal, you will receive your results only by: Marland Kitchen MyChart Message (if you have MyChart) OR . A paper copy in the mail If you have any lab test that is abnormal or we need to change your treatment, we will call you to review the results.   Testing/Procedures: No changes   Follow-Up: At [facility], you and your health needs are our priority.  As part of our continuing mission to provide you with exceptional heart care, we have created designated Provider Care Teams.  These Care Teams include your primary Cardiologist (physician) and Advanced Practice Providers (APPs -  Physician Assistants and Nurse Practitioners) who all work together to provide you with the care you need, when you need it.  We recommend signing up for the patient portal called "MyChart".  Sign up information is provided on this After Visit Summary.  MyChart is used to connect with patients for Virtual Visits (Telemedicine).  Patients are able to view lab/test results, encounter notes, upcoming appointments, etc.  Non-urgent messages can be sent to your provider as well.   To learn more about what you can do with MyChart, go to [email or web address].    Your next appointment:   1 year(s)  The format for your next appointment:   In Person  Provider:   Kate Sable, MD   Other Instructions     Signed, Kate Sable, MD  12/08/2019 4:54 PM    [place]

## 2019-12-08 NOTE — Patient Instructions (Signed)
Medication Instructions:  NO changes *If you need a refill on your cardiac medications before your next appointment, please call your pharmacy*   Lab Work: No Changes If you have labs (blood work) drawn today and your tests are completely normal, you will receive your results only by: Marland Kitchen MyChart Message (if you have MyChart) OR . A paper copy in the mail If you have any lab test that is abnormal or we need to change your treatment, we will call you to review the results.   Testing/Procedures: No changes   Follow-Up: At [facility], you and your health needs are our priority.  As part of our continuing mission to provide you with exceptional heart care, we have created designated Provider Care Teams.  These Care Teams include your primary Cardiologist (physician) and Advanced Practice Providers (APPs -  Physician Assistants and Nurse Practitioners) who all work together to provide you with the care you need, when you need it.  We recommend signing up for the patient portal called "MyChart".  Sign up information is provided on this After Visit Summary.  MyChart is used to connect with patients for Virtual Visits (Telemedicine).  Patients are able to view lab/test results, encounter notes, upcoming appointments, etc.  Non-urgent messages can be sent to your provider as well.   To learn more about what you can do with MyChart, go to [email or web address].    Your next appointment:   1 year(s)  The format for your next appointment:   In Person  Provider:   Kate Sable, MD   Other Instructions

## 2019-12-12 ENCOUNTER — Other Ambulatory Visit: Payer: Self-pay

## 2019-12-12 DIAGNOSIS — I10 Essential (primary) hypertension: Secondary | ICD-10-CM

## 2019-12-12 MED ORDER — LOSARTAN POTASSIUM 25 MG PO TABS: 50.0000 mg | ORAL_TABLET | Freq: Every day | ORAL | 1 refills | Status: DC

## 2019-12-13 ENCOUNTER — Ambulatory Visit: Payer: No Typology Code available for payment source

## 2019-12-13 DIAGNOSIS — I270 Primary pulmonary hypertension: Secondary | ICD-10-CM

## 2019-12-13 DIAGNOSIS — I272 Pulmonary hypertension, unspecified: Secondary | ICD-10-CM | POA: Diagnosis not present

## 2019-12-15 ENCOUNTER — Telehealth: Payer: Self-pay

## 2019-12-15 NOTE — Telephone Encounter (Signed)
Patient letter up front and ready for pick up. Shelley [name]

## 2019-12-18 NOTE — Progress Notes (Signed)
Scanned in work school excuse letter

## 2019-12-26 ENCOUNTER — Encounter: Payer: Self-pay | Admitting: Nurse Practitioner

## 2019-12-26 ENCOUNTER — Other Ambulatory Visit: Payer: Self-pay

## 2019-12-26 ENCOUNTER — Ambulatory Visit: Payer: No Typology Code available for payment source | Admitting: Nurse Practitioner

## 2019-12-26 VITALS — BP 142/80 | HR 89 | Temp 97.5°F | Resp 16 | Ht 66.0 in | Wt 287.0 lb

## 2019-12-26 DIAGNOSIS — I272 Pulmonary hypertension, unspecified: Secondary | ICD-10-CM

## 2019-12-26 DIAGNOSIS — E039 Hypothyroidism, unspecified: Secondary | ICD-10-CM

## 2019-12-26 DIAGNOSIS — I10 Essential (primary) hypertension: Secondary | ICD-10-CM

## 2019-12-26 NOTE — Progress Notes (Signed)
[facility] [place], [place] [address]  Internal MEDICINE  Office Visit Note  Patient Name: Shelley [name]  [id number]  [id number]  Date of Service: 01/14/2020  Chief Complaint  Patient presents with  . Follow-up  . Hypertension  . controlled substance form    reviewed with PT  . Quality Metric Gaps    flu,tetnaus, hep C    The patient is here for follow up visit. Blood pressure is mildly elevated today, however, it is generally well controlled. She does see cardiologist routinely. She states that she is doing well and has no concerns or complaints today.       Current Medication: Outpatient Encounter Medications as of 12/26/2019  Medication Sig  . acetaminophen (TYLENOL) 500 MG tablet Take 1,000 mg by mouth every 6 (six) hours as needed for pain.  Marland Kitchen ALPRAZolam (XANAX) 0.25 MG tablet Take 1 tablet po QD PRN  . levonorgestrel (MIRENA) 20 MCG/24HR IUD 20 mcg by Intrauterine route.  Marland Kitchen losartan (COZAAR) 25 MG tablet Take 2 tablets (50 mg total) by mouth daily.  . meloxicam (MOBIC) 15 MG tablet Take 1 tablet (15 mg total) by mouth daily as needed.  . Multiple Vitamin (MULTIVITAMIN) tablet Take 1 tablet by mouth daily.  Marland Kitchen SYNTHROID 25 MCG tablet Take 25 mcg by mouth daily.   Facility-Administered Encounter Medications as of 12/26/2019  Medication  . sodium chloride flush (NS) 0.9 % injection 3 mL    Surgical History: Past Surgical History:  Procedure Laterality Date  . RIGHT/LEFT HEART CATH AND CORONARY ANGIOGRAPHY Bilateral 05/26/2019   Procedure: RIGHT/LEFT HEART CATH AND CORONARY ANGIOGRAPHY;  Surgeon: Nelva Bush, MD;  Location: [place] [facility];  Service: Cardiovascular;  Laterality: Bilateral;  . TUBAL LIGATION      Medical History: Past Medical History:  Diagnosis Date  . Anemia   . Arthritis   . Cardiomegaly   . Hypertension   . Pulmonary hypertension ([place])   . Thyroid disease     Family History: Family History   Problem Relation Age of Onset  . Cancer Other   . Breast cancer Maternal Grandmother   . Breast cancer Cousin        1st maternal cousin  . Heart attack Mother     Social History   Socioeconomic History  . Marital status: Single    Spouse name: Not on file  . Number of children: Not on file  . Years of education: Not on file  . Highest education level: Not on file  Occupational History  . Not on file  Tobacco Use  . Smoking status: Never Smoker  . Smokeless tobacco: Never Used  Vaping Use  . Vaping Use: Never used  Substance and Sexual Activity  . Alcohol use: No  . Drug use: No  . Sexual activity: Yes    Birth control/protection: I.U.D.  Other Topics Concern  . Not on file  Social History Narrative  . Not on file   Social Determinants of Health   Financial Resource Strain:   . Difficulty of Paying Living Expenses: Not on file  Food Insecurity:   . Worried About Charity fundraiser in the Last Year: Not on file  . Ran Out of Food in the Last Year: Not on file  Transportation Needs:   . Lack of Transportation (Medical): Not on file  . Lack of Transportation (Non-Medical): Not on file  Physical Activity:   . Days of Exercise per Week: Not on  file  . Minutes of Exercise per Session: Not on file  Stress:   . Feeling of Stress : Not on file  Social Connections:   . Frequency of Communication with Friends and Family: Not on file  . Frequency of Social Gatherings with Friends and Family: Not on file  . Attends Religious Services: Not on file  . Active Member of Clubs or Organizations: Not on file  . Attends Archivist Meetings: Not on file  . Marital Status: Not on file  Intimate Partner Violence:   . Fear of Current or Ex-Partner: Not on file  . Emotionally Abused: Not on file  . Physically Abused: Not on file  . Sexually Abused: Not on file      Review of Systems  Constitutional: Negative for activity change, chills, fatigue and unexpected  weight change.  HENT: Negative for congestion, postnasal drip, rhinorrhea, sneezing and sore throat.   Respiratory: Negative for cough, chest tightness, shortness of breath and wheezing.   Cardiovascular: Negative for chest pain and palpitations.       Elevated blood pressure today .  Gastrointestinal: Negative for abdominal pain, constipation, diarrhea, nausea and vomiting.  Endocrine: Negative for cold intolerance, heat intolerance, polydipsia and polyuria.  Musculoskeletal: Negative for arthralgias, back pain, joint swelling and neck pain.  Skin: Negative for rash.  Allergic/Immunologic: Negative for environmental allergies.  Neurological: Negative for dizziness, tremors, numbness and headaches.  Hematological: Negative for adenopathy. Does not bruise/bleed easily.  Psychiatric/Behavioral: Negative for behavioral problems (Depression), sleep disturbance and suicidal ideas. The patient is not nervous/anxious.     Today's Vitals   12/26/19 1154  BP: (!) 142/80  Pulse: 89  Resp: 16  Temp: (!) 97.5 F (36.4 C)  SpO2: 96%  Weight: 287 lb (130.2 kg)  Height: 5\' 6"  (9.476 m)   Body mass index is 46.32 kg/m.  Physical Exam Vitals and nursing note reviewed.  Constitutional:      General: She is not in acute distress.    Appearance: Normal appearance. She is well-developed. She is obese. She is not diaphoretic.  HENT:     Head: Normocephalic and atraumatic.     Nose: Nose normal.     Mouth/Throat:     Pharynx: No oropharyngeal exudate.  Eyes:     Pupils: Pupils are equal, round, and reactive to light.  Neck:     Thyroid: No thyromegaly.     Vascular: No carotid bruit or JVD.     Trachea: No tracheal deviation.  Cardiovascular:     Rate and Rhythm: Normal rate and regular rhythm.     Heart sounds: Normal heart sounds. No murmur heard.  No friction rub. No gallop.   Pulmonary:     Effort: Pulmonary effort is normal. No respiratory distress.     Breath sounds: Normal breath  sounds. No wheezing or rales.  Chest:     Chest wall: No tenderness.  Abdominal:     Palpations: Abdomen is soft.  Musculoskeletal:        General: Normal range of motion.     Cervical back: Normal range of motion and neck supple.  Lymphadenopathy:     Cervical: No cervical adenopathy.  Skin:    General: Skin is warm and dry.  Neurological:     General: No focal deficit present.     Mental Status: She is alert and oriented to person, place, and time.     Cranial Nerves: No cranial nerve deficit.  Psychiatric:  Mood and Affect: Mood normal.        Behavior: Behavior normal.        Thought Content: Thought content normal.        Judgment: Judgment normal.    Assessment/Plan: 1. Essential hypertension Blood pressure generally stable. Continue medications as prescribed   2. Acquired hypothyroidism Stable. Continue lvothyroxine as prescribed   3. Pulmonary hypertension ([place]) Continue regular visits with pulmonology and cardiology as scheduled for routine surveillance.   General Counseling: Abbi verbalizes understanding of the findings of todays visit and agrees with plan of treatment. I have discussed any further diagnostic evaluation that may be needed or ordered today. We also reviewed her medications today. she has been encouraged to call the office with any questions or concerns that should arise related to todays visit.  Hypertension Counseling:   The following hypertensive lifestyle modification were recommended and discussed:  1. Limiting alcohol intake to less than 1 oz/day of ethanol:(24 oz of beer or 8 oz of wine or 2 oz of 100-proof whiskey). 2. Take baby ASA 81 mg daily. 3. Importance of regular aerobic exercise and losing weight. 4. Reduce dietary saturated fat and cholesterol intake for overall cardiovascular health. 5. Maintaining adequate dietary potassium, calcium, and magnesium intake. 6. Regular monitoring of the blood pressure. 7. Reduce sodium  intake to less than 100 mmol/day (less than 2.3 gm of sodium or less than 6 gm of sodium choride)   This patient was seen by [place] with Dr Lavera Guise as a part of collaborative care agreement   Total time spent: 25 Minutes   Time spent includes review of chart, medications, test results, and follow up plan with the patient.      Dr Lavera Guise Internal medicine

## 2020-01-02 ENCOUNTER — Ambulatory Visit: Payer: No Typology Code available for payment source | Admitting: Nurse Practitioner

## 2020-01-18 ENCOUNTER — Other Ambulatory Visit: Payer: Self-pay

## 2020-01-18 DIAGNOSIS — M150 Primary generalized (osteo)arthritis: Secondary | ICD-10-CM

## 2020-01-18 MED ORDER — MELOXICAM 15 MG PO TABS: 15.0000 mg | ORAL_TABLET | Freq: Every day | ORAL | 2 refills | Status: DC | PRN

## 2020-03-15 ENCOUNTER — Encounter: Payer: Self-pay | Admitting: Hospice and Palliative Medicine

## 2020-03-15 ENCOUNTER — Ambulatory Visit: Payer: BC Managed Care – PPO | Admitting: Hospice and Palliative Medicine

## 2020-03-15 VITALS — Temp 98.7°F | Ht 66.0 in | Wt 282.0 lb

## 2020-03-15 DIAGNOSIS — R059 Cough, unspecified: Secondary | ICD-10-CM | POA: Diagnosis not present

## 2020-03-15 DIAGNOSIS — J0140 Acute pansinusitis, unspecified: Secondary | ICD-10-CM | POA: Diagnosis not present

## 2020-03-15 MED ORDER — AZITHROMYCIN 250 MG PO TABS: ORAL_TABLET | ORAL | 0 refills | Status: DC

## 2020-03-15 NOTE — Progress Notes (Signed)
[facility] [place], [place] [address]  Internal MEDICINE  Telephone Visit  Patient Name: Shelley [name]  [id number]  [id number]  Date of Service: 03/15/2020  I connected with the patient at [phone] by telephone and verified the patients identity using two identifiers.   I discussed the limitations, risks, security and privacy concerns of performing an evaluation and management service by telephone and the availability of in person appointments. I also discussed with the patient that there may be a patient responsible charge related to the service.  The patient expressed understanding and agrees to proceed.    Chief Complaint  Patient presents with  . Telephone Assessment    [phone]  . Telephone Screen  . Sinusitis  . Cough    HOME COVID TEST NEGATIVE  . Sore Throat     Start Last Friday     HPI Patient is being seen today via virtual visit for acute sick visit C/o nasal congestion, sinus pressure and pain and scratchy Denies fevers or body aches or shortness of breath Symptoms started 1 week ago, symptoms have not gotten any better Has been taking OTC cold and sinus medication Home COVID testing negative Received 2 vaccinations, will need booster to complete series  Current Medication: Outpatient Encounter Medications as of 03/15/2020  Medication Sig  . acetaminophen (TYLENOL) 500 MG tablet Take 1,000 mg by mouth every 6 (six) hours as needed for pain.  Marland Kitchen ALPRAZolam (XANAX) 0.25 MG tablet Take 1 tablet po QD PRN  . azithromycin (ZITHROMAX Z-PAK) 250 MG tablet Take two 250 mg tablets on day one followed by one 250 mg tablet each day for four days.  Marland Kitchen levonorgestrel (MIRENA) 20 MCG/24HR IUD 20 mcg by Intrauterine route.  Marland Kitchen losartan (COZAAR) 25 MG tablet Take 2 tablets (50 mg total) by mouth daily.  . meloxicam (MOBIC) 15 MG tablet Take 1 tablet (15 mg total) by mouth daily as needed.  . Multiple Vitamin (MULTIVITAMIN) tablet Take 1 tablet by mouth  daily.  Marland Kitchen SYNTHROID 25 MCG tablet Take 25 mcg by mouth daily.   Facility-Administered Encounter Medications as of 03/15/2020  Medication  . sodium chloride flush (NS) 0.9 % injection 3 mL    Surgical History: Past Surgical History:  Procedure Laterality Date  . RIGHT/LEFT HEART CATH AND CORONARY ANGIOGRAPHY Bilateral 05/26/2019   Procedure: RIGHT/LEFT HEART CATH AND CORONARY ANGIOGRAPHY;  Surgeon: Nelva Bush, MD;  Location: [place];  Service: Cardiovascular;  Laterality: Bilateral;  . TUBAL LIGATION      Medical History: Past Medical History:  Diagnosis Date  . Anemia   . Arthritis   . Cardiomegaly   . Hypertension   . Pulmonary hypertension ([place])   . Thyroid disease     Family History: Family History  Problem Relation Age of Onset  . Cancer Other   . Breast cancer Maternal Grandmother   . Breast cancer Cousin        1st maternal cousin  . Heart attack Mother     Social History   Socioeconomic History  . Marital status: Single    Spouse name: Not on file  . Number of children: Not on file  . Years of education: Not on file  . Highest education level: Not on file  Occupational History  . Not on file  Tobacco Use  . Smoking status: Never Smoker  . Smokeless tobacco: Never Used  Vaping Use  . Vaping Use: Never used  Substance and Sexual Activity  .  Alcohol use: No  . Drug use: No  . Sexual activity: Yes    Birth control/protection: I.U.D.  Other Topics Concern  . Not on file  Social History Narrative  . Not on file   Social Determinants of Health   Financial Resource Strain: Not on file  Food Insecurity: Not on file  Transportation Needs: Not on file  Physical Activity: Not on file  Stress: Not on file  Social Connections: Not on file  Intimate Partner Violence: Not on file      Review of Systems  Constitutional: Negative for chills, diaphoresis and fatigue.  HENT: Positive for congestion, sinus pressure, sinus pain and sore  throat. Negative for ear pain and postnasal drip.   Eyes: Negative for photophobia, discharge, redness, itching and visual disturbance.  Respiratory: Positive for cough. Negative for shortness of breath and wheezing.   Cardiovascular: Negative for chest pain, palpitations and leg swelling.  Gastrointestinal: Negative for abdominal pain, constipation, diarrhea, nausea and vomiting.  Genitourinary: Negative for dysuria and flank pain.  Musculoskeletal: Negative for arthralgias, back pain, gait problem and neck pain.  Skin: Negative for color change.  Allergic/Immunologic: Negative for environmental allergies and food allergies.  Neurological: Negative for dizziness and headaches.  Hematological: Does not bruise/bleed easily.  Psychiatric/Behavioral: Negative for agitation, behavioral problems (depression) and hallucinations.    Vital Signs: Temp 98.7 F (37.1 C)   Ht 5\' 6"  (1.676 m)   Wt 282 lb (127.9 kg)   BMI 45.52 kg/m    Observation/Objective: No acute distress noted  Assessment/Plan: 1. Acute non-recurrent pansinusitis Completed ZPAK, advised to contact office if symptoms do not improve or worsen in 1 week - azithromycin (ZITHROMAX Z-PAK) 250 MG tablet; Take two 250 mg tablets on day one followed by one 250 mg tablet each day for four days.  Dispense: 6 each; Refill: 0  2. Cough Advised to take Mucinex and increase fluid intake Will send for CXR if symptoms do not improve or worsen, consider PCR COVID testing  General Counseling: Shelley [name] verbalizes understanding of the findings of today's phone visit and agrees with plan of treatment. I have discussed any further diagnostic evaluation that may be needed or ordered today. We also reviewed her medications today. she has been encouraged to call the office with any questions or concerns that should arise related to todays visit.   Meds ordered this encounter  Medications  . azithromycin (ZITHROMAX Z-PAK) 250 MG tablet    Sig:  Take two 250 mg tablets on day one followed by one 250 mg tablet each day for four days.    Dispense:  6 each    Refill:  0     Time spent: 25 Minutes Time spent includes review of chart, medications, test results and follow-up plan with the patient.  Tanna Furry Cheryl Chay AGNP-C Internal medicine

## 2020-03-19 ENCOUNTER — Ambulatory Visit: Payer: BC Managed Care – PPO | Admitting: Internal Medicine

## 2020-03-19 VITALS — Resp 16 | Ht 66.0 in | Wt 282.0 lb

## 2020-03-19 DIAGNOSIS — H1010 Acute atopic conjunctivitis, unspecified eye: Secondary | ICD-10-CM | POA: Diagnosis not present

## 2020-03-19 DIAGNOSIS — J309 Allergic rhinitis, unspecified: Secondary | ICD-10-CM | POA: Diagnosis not present

## 2020-03-19 DIAGNOSIS — J45991 Cough variant asthma: Secondary | ICD-10-CM | POA: Diagnosis not present

## 2020-03-19 MED ORDER — ACETAMINOPHEN-CODEINE #3 300-30 MG PO TABS: ORAL_TABLET | ORAL | 0 refills | Status: DC

## 2020-03-19 MED ORDER — ALBUTEROL SULFATE HFA 108 (90 BASE) MCG/ACT IN AERS: 2.0000 | INHALATION_SPRAY | Freq: Four times a day (QID) | RESPIRATORY_TRACT | 0 refills | Status: DC | PRN

## 2020-03-19 NOTE — Progress Notes (Signed)
[facility] [place], [place] [address]  Internal MEDICINE  Telephone Visit  Patient Name: Shelley [name]  [id number]  [id number]  Date of Service: 03/27/2020  I connected with the patient at 425 by telephone and verified the patients identity using two identifiers.   I discussed the limitations, risks, security and privacy concerns of performing an evaluation and management service by telephone and the availability of in person appointments. I also discussed with the patient that there may be a patient responsible charge related to the service.  The patient expressed understanding and agrees to proceed.    Chief Complaint  Patient presents with  . Acute Visit    Cough for a couple of days but worse today, neg. Covid test  . Telephone Assessment    [phone]   . Telephone Screen    Video call    HPI Pt is connected via video for sick visit, her main concern is cough at the moment she was also seen on 1/7 for the following symptoms nasal congestion, sinus pressure and pain and scratchy Denies fevers or body aches or shortness of breath Symptoms started 1 week ago, symptoms have not gotten any better Has been taking OTC cold and sinus medication Home COVID testing negative   Current Medication: Outpatient Encounter Medications as of 03/19/2020  Medication Sig  . acetaminophen-codeine (TYLENOL #3) 300-30 MG tablet Take one tab po qhs for cough  . albuterol (VENTOLIN HFA) 108 (90 Base) MCG/ACT inhaler Inhale 2 puffs into the lungs every 6 (six) hours as needed for wheezing or shortness of breath.  . ALPRAZolam (XANAX) 0.25 MG tablet Take 1 tablet po QD PRN  . azithromycin (ZITHROMAX Z-PAK) 250 MG tablet Take two 250 mg tablets on day one followed by one 250 mg tablet each day for four days.  Marland Kitchen levonorgestrel (MIRENA) 20 MCG/24HR IUD 20 mcg by Intrauterine route.  Marland Kitchen losartan (COZAAR) 25 MG tablet Take 2 tablets (50 mg total) by mouth daily.  .  meloxicam (MOBIC) 15 MG tablet Take 1 tablet (15 mg total) by mouth daily as needed.  . Multiple Vitamin (MULTIVITAMIN) tablet Take 1 tablet by mouth daily.  Marland Kitchen SYNTHROID 25 MCG tablet Take 25 mcg by mouth daily.  . [DISCONTINUED] acetaminophen (TYLENOL) 500 MG tablet Take 1,000 mg by mouth every 6 (six) hours as needed for pain.   Facility-Administered Encounter Medications as of 03/19/2020  Medication  . sodium chloride flush (NS) 0.9 % injection 3 mL    Surgical History: Past Surgical History:  Procedure Laterality Date  . RIGHT/LEFT HEART CATH AND CORONARY ANGIOGRAPHY Bilateral 05/26/2019   Procedure: RIGHT/LEFT HEART CATH AND CORONARY ANGIOGRAPHY;  Surgeon: Nelva Bush, MD;  Location: [facility] [facility];  Service: Cardiovascular;  Laterality: Bilateral;  . TUBAL LIGATION      Medical History: Past Medical History:  Diagnosis Date  . Anemia   . Arthritis   . Cardiomegaly   . Hypertension   . Pulmonary hypertension ([place])   . Thyroid disease     Family History: Family History  Problem Relation Age of Onset  . Cancer Other   . Breast cancer Maternal Grandmother   . Breast cancer Cousin        1st maternal cousin  . Heart attack Mother     Social History   Socioeconomic History  . Marital status: Single    Spouse name: Not on file  . Number of children: Not on file  . Years of  education: Not on file  . Highest education level: Not on file  Occupational History  . Not on file  Tobacco Use  . Smoking status: Never Smoker  . Smokeless tobacco: Never Used  Vaping Use  . Vaping Use: Never used  Substance and Sexual Activity  . Alcohol use: No  . Drug use: No  . Sexual activity: Yes    Birth control/protection: I.U.D.  Other Topics Concern  . Not on file  Social History Narrative  . Not on file   Social Determinants of Health   Financial Resource Strain: Not on file  Food Insecurity: Not on file  Transportation Needs: Not on file  Physical Activity:  Not on file  Stress: Not on file  Social Connections: Not on file  Intimate Partner Violence: Not on file      Review of Systems  Constitutional: Positive for fatigue.  HENT: Positive for sinus pressure.   Respiratory: Positive for cough.   Cardiovascular: Negative.   Gastrointestinal: Negative.   Psychiatric/Behavioral: Negative.     Vital Signs: Resp 16   Ht 5\' 6"  (1.676 m)   Wt 282 lb (127.9 kg)   BMI 45.52 kg/m    Observation/Objective: Pt is cough and is congested NAD    Assessment/Plan: 1. Cough variant asthma Finish Zpak as prescribed on 1/7, might need CXR. Needs symptomatic relief  - albuterol (VENTOLIN HFA) 108 (90 Base) MCG/ACT inhaler; Inhale 2 puffs into the lungs every 6 (six) hours as needed for wheezing or shortness of breath.  Dispense: 8 g; Refill: 0 - acetaminophen-codeine (TYLENOL #3) 300-30 MG tablet; Take one tab po qhs for cough  Dispense: 15 tablet; Refill: 0  2. Allergic rhinoconjunctivitis Take Flonase or Claritin prn   General Counseling: Niasia verbalizes understanding of the findings of today's phone visit and agrees with plan of treatment. I have discussed any further diagnostic evaluation that may be needed or ordered today. We also reviewed her medications today. she has been encouraged to call the office with any questions or concerns that should arise related to todays visit.    No orders of the defined types were placed in this encounter.   Meds ordered this encounter  Medications  . albuterol (VENTOLIN HFA) 108 (90 Base) MCG/ACT inhaler    Sig: Inhale 2 puffs into the lungs every 6 (six) hours as needed for wheezing or shortness of breath.    Dispense:  8 g    Refill:  0  . acetaminophen-codeine (TYLENOL #3) 300-30 MG tablet    Sig: Take one tab po qhs for cough    Dispense:  15 tablet    Refill:  0    Time spent:15 Minutes    Dr Lavera Guise Internal medicine

## 2020-04-23 ENCOUNTER — Other Ambulatory Visit: Payer: Self-pay

## 2020-04-24 ENCOUNTER — Encounter: Payer: Self-pay | Admitting: Hospice and Palliative Medicine

## 2020-04-24 ENCOUNTER — Other Ambulatory Visit: Payer: Self-pay

## 2020-04-24 ENCOUNTER — Ambulatory Visit: Payer: BC Managed Care – PPO | Admitting: Hospice and Palliative Medicine

## 2020-04-24 VITALS — BP 142/86 | HR 88 | Temp 97.3°F | Resp 16 | Ht 66.0 in | Wt 283.6 lb

## 2020-04-24 DIAGNOSIS — R053 Chronic cough: Secondary | ICD-10-CM | POA: Diagnosis not present

## 2020-04-24 DIAGNOSIS — U099 Post covid-19 condition, unspecified: Secondary | ICD-10-CM

## 2020-04-24 DIAGNOSIS — R059 Cough, unspecified: Secondary | ICD-10-CM

## 2020-04-24 DIAGNOSIS — I10 Essential (primary) hypertension: Secondary | ICD-10-CM

## 2020-04-24 MED ORDER — BENZONATATE 100 MG PO CAPS: 100.0000 mg | ORAL_CAPSULE | Freq: Two times a day (BID) | ORAL | 0 refills | Status: DC | PRN

## 2020-04-24 MED ORDER — PREDNISONE 10 MG PO TABS: ORAL_TABLET | ORAL | 0 refills | Status: DC

## 2020-04-24 NOTE — Progress Notes (Signed)
[facility] [place], [place] [address]  Internal MEDICINE  Office Visit Note  Patient Name: Shelley [name]  [id number]  [id number]  Date of Service: 04/27/2020  Chief Complaint  Patient presents with  . Acute Visit  . Cough    Started again last week      HPI Pt is here for a sick visit. Continues to have dry hacking cough, has been ongoing for the last month Personal history of COVID in September--CXR in September evidence of streaky opacities in right mid lung and left lung base No repeat imaging on file since September No personal history of smoking or exposure to second hand cigarette smoking   Current Medication:  Outpatient Encounter Medications as of 04/24/2020  Medication Sig  . benzonatate (TESSALON) 100 MG capsule Take 1 capsule (100 mg total) by mouth 2 (two) times daily as needed for cough.  . predniSONE (DELTASONE) 10 MG tablet Take 1 tablet three times a day with a meal for three for three days, take 1 tablet by twice daily with a meal for 3 days, take 1 tablet once daily with a meal for 3 days  . albuterol (VENTOLIN HFA) 108 (90 Base) MCG/ACT inhaler Inhale 2 puffs into the lungs every 6 (six) hours as needed for wheezing or shortness of breath.  . ALPRAZolam (XANAX) 0.25 MG tablet Take 1 tablet po QD PRN  . levonorgestrel (MIRENA) 20 MCG/24HR IUD 20 mcg by Intrauterine route.  Marland Kitchen losartan (COZAAR) 25 MG tablet Take 2 tablets (50 mg total) by mouth daily.  . meloxicam (MOBIC) 15 MG tablet Take 1 tablet (15 mg total) by mouth daily as needed.  . Multiple Vitamin (MULTIVITAMIN) tablet Take 1 tablet by mouth daily.  Marland Kitchen SYNTHROID 25 MCG tablet Take 25 mcg by mouth daily.  . [DISCONTINUED] acetaminophen-codeine (TYLENOL #3) 300-30 MG tablet Take one tab po qhs for cough  . [DISCONTINUED] azithromycin (ZITHROMAX Z-PAK) 250 MG tablet Take two 250 mg tablets on day one followed by one 250 mg tablet each day for four days.    Facility-Administered Encounter Medications as of 04/24/2020  Medication  . sodium chloride flush (NS) 0.9 % injection 3 mL      Medical History: Past Medical History:  Diagnosis Date  . Anemia   . Arthritis   . Cardiomegaly   . Hypertension   . Pulmonary hypertension ([place])   . Thyroid disease      Vital Signs: BP (!) 142/86   Pulse 88   Temp (!) 97.3 F (36.3 C)   Resp 16   Ht 5\' 6"  (1.676 m)   Wt 283 lb 9.6 oz (128.6 kg)   SpO2 97%   BMI 45.77 kg/m    Review of Systems  Constitutional: Negative for chills, diaphoresis and fatigue.  HENT: Negative for ear pain, postnasal drip and sinus pressure.   Eyes: Negative for photophobia, discharge, redness, itching and visual disturbance.  Respiratory: Positive for cough. Negative for shortness of breath and wheezing.   Cardiovascular: Negative for chest pain, palpitations and leg swelling.  Gastrointestinal: Negative for abdominal pain, constipation, diarrhea, nausea and vomiting.  Genitourinary: Negative for dysuria and flank pain.  Musculoskeletal: Negative for arthralgias, back pain, gait problem and neck pain.  Skin: Negative for color change.  Allergic/Immunologic: Negative for environmental allergies and food allergies.  Neurological: Negative for dizziness and headaches.  Hematological: Does not bruise/bleed easily.  Psychiatric/Behavioral: Negative for agitation, behavioral problems (depression) and hallucinations.    Physical  Exam Vitals reviewed.  Constitutional:      Appearance: Normal appearance. She is obese.  Cardiovascular:     Rate and Rhythm: Normal rate and regular rhythm.     Pulses: Normal pulses.     Heart sounds: Normal heart sounds.  Pulmonary:     Effort: Pulmonary effort is normal.     Breath sounds: Normal breath sounds.  Abdominal:     General: Abdomen is flat.     Palpations: Abdomen is soft.  Musculoskeletal:        General: Normal range of motion.     Cervical back: Normal  range of motion.  Skin:    General: Skin is warm.  Neurological:     General: No focal deficit present.     Mental Status: She is alert and oriented to person, place, and time. Mental status is at baseline.  Psychiatric:        Mood and Affect: Mood normal.        Behavior: Behavior normal.        Thought Content: Thought content normal.        Judgment: Judgment normal.    Assessment/Plan: 1. Post-COVID chronic cough Coughing ongoing since early in January--also had lingering cough after personal COVID infection in September last year Short course prednisone as well as Tessalon Will review CT chest as well as PFT and adjust care accordingly Advised to stop OTC cough and cold medications Noted she is taking losartan for HTN//may consider discontinuing due to dry coughing - Pulmonary function test; Future - CT Chest Wo Contrast; Future - predniSONE (DELTASONE) 10 MG tablet; Take 1 tablet three times a day with a meal for three for three days, take 1 tablet by twice daily with a meal for 3 days, take 1 tablet once daily with a meal for 3 days  Dispense: 18 tablet; Refill: 0 - benzonatate (TESSALON) 100 MG capsule; Take 1 capsule (100 mg total) by mouth 2 (two) times daily as needed for cough.  Dispense: 20 capsule; Refill: 0  2. Essential hypertension BP slightly elevated today, has been taking OTC cough and cold medications for several days, advised to stop OTC medications and will closely monitor BP--if remains elevated will adjust therpay  General Counseling: Darlene verbalizes understanding of the findings of todays visit and agrees with plan of treatment. I have discussed any further diagnostic evaluation that may be needed or ordered today. We also reviewed her medications today. she has been encouraged to call the office with any questions or concerns that should arise related to todays visit.   Orders Placed This Encounter  Procedures  . CT Chest Wo Contrast  . Pulmonary  function test    Meds ordered this encounter  Medications  . predniSONE (DELTASONE) 10 MG tablet    Sig: Take 1 tablet three times a day with a meal for three for three days, take 1 tablet by twice daily with a meal for 3 days, take 1 tablet once daily with a meal for 3 days    Dispense:  18 tablet    Refill:  0  . benzonatate (TESSALON) 100 MG capsule    Sig: Take 1 capsule (100 mg total) by mouth 2 (two) times daily as needed for cough.    Dispense:  20 capsule    Refill:  0    Time spent: 30 Minutes Time spent includes review of chart, medications, test results and follow-up plan with the patient.  This patient was seen  by Theodoro Grist AGNP-C in Collaboration with Dr Lavera Guise as a part of collaborative care agreement.  Tanna Furry [facility] Internal Medicine

## 2020-04-27 ENCOUNTER — Encounter: Payer: Self-pay | Admitting: Hospice and Palliative Medicine

## 2020-05-08 ENCOUNTER — Other Ambulatory Visit: Payer: Self-pay | Admitting: Nurse Practitioner

## 2020-05-08 ENCOUNTER — Telehealth: Payer: Self-pay

## 2020-05-08 DIAGNOSIS — I10 Essential (primary) hypertension: Secondary | ICD-10-CM

## 2020-05-08 NOTE — Telephone Encounter (Signed)
Left a detailed message advising pt of ct appt. Nini Cavan

## 2020-05-15 ENCOUNTER — Ambulatory Visit: Payer: BC Managed Care – PPO | Admitting: Internal Medicine

## 2020-05-15 DIAGNOSIS — R0602 Shortness of breath: Secondary | ICD-10-CM

## 2020-05-15 DIAGNOSIS — U099 Post covid-19 condition, unspecified: Secondary | ICD-10-CM

## 2020-05-15 LAB — PULMONARY FUNCTION TEST

## 2020-05-19 DIAGNOSIS — R42 Dizziness and giddiness: Secondary | ICD-10-CM | POA: Diagnosis not present

## 2020-05-19 DIAGNOSIS — R457 State of emotional shock and stress, unspecified: Secondary | ICD-10-CM | POA: Diagnosis not present

## 2020-05-19 DIAGNOSIS — I10 Essential (primary) hypertension: Secondary | ICD-10-CM | POA: Diagnosis not present

## 2020-05-19 NOTE — Procedures (Signed)
[facility] [place], [address]    Complete Pulmonary Function Testing Interpretation:  FINDINGS:  The forced vital capacity is normal.  FEV1 is normal.  F1 FVC ratio is normal.  Postbronchodilator there was no significant change in the FEV1.  Total lung capacity is normal.  Residual volume is normal.  Residual internal capacity ratio is increased.  FRC is normal.  DLCO is within normal limits.  IMPRESSION:  This pulmonary function study is within normal limits there was no significant improvement after bronchodilators.  Clinical correlation is recommended  Allyne Gee, MD [facility] Pulmonary Critical Care Medicine Sleep Medicine

## 2020-05-22 DIAGNOSIS — Z6841 Body Mass Index (BMI) 40.0 and over, adult: Secondary | ICD-10-CM | POA: Diagnosis not present

## 2020-05-22 DIAGNOSIS — Z803 Family history of malignant neoplasm of breast: Secondary | ICD-10-CM | POA: Diagnosis not present

## 2020-05-22 DIAGNOSIS — Z8616 Personal history of COVID-19: Secondary | ICD-10-CM | POA: Diagnosis not present

## 2020-05-22 DIAGNOSIS — R519 Headache, unspecified: Secondary | ICD-10-CM | POA: Diagnosis not present

## 2020-05-22 DIAGNOSIS — R0789 Other chest pain: Secondary | ICD-10-CM | POA: Diagnosis not present

## 2020-05-22 DIAGNOSIS — I272 Pulmonary hypertension, unspecified: Secondary | ICD-10-CM | POA: Diagnosis not present

## 2020-05-22 DIAGNOSIS — Z9981 Dependence on supplemental oxygen: Secondary | ICD-10-CM | POA: Diagnosis not present

## 2020-05-22 DIAGNOSIS — R030 Elevated blood-pressure reading, without diagnosis of hypertension: Secondary | ICD-10-CM | POA: Diagnosis not present

## 2020-05-22 DIAGNOSIS — R079 Chest pain, unspecified: Secondary | ICD-10-CM | POA: Diagnosis not present

## 2020-05-22 DIAGNOSIS — R42 Dizziness and giddiness: Secondary | ICD-10-CM | POA: Diagnosis not present

## 2020-05-22 DIAGNOSIS — R059 Cough, unspecified: Secondary | ICD-10-CM | POA: Diagnosis not present

## 2020-05-23 ENCOUNTER — Other Ambulatory Visit: Payer: Self-pay

## 2020-05-23 ENCOUNTER — Telehealth: Payer: Self-pay

## 2020-05-23 DIAGNOSIS — U099 Post covid-19 condition, unspecified: Secondary | ICD-10-CM

## 2020-05-23 DIAGNOSIS — R079 Chest pain, unspecified: Secondary | ICD-10-CM | POA: Diagnosis not present

## 2020-05-23 MED ORDER — BENZONATATE 100 MG PO CAPS: 100.0000 mg | ORAL_CAPSULE | Freq: Two times a day (BID) | ORAL | 1 refills | Status: DC | PRN

## 2020-05-23 NOTE — Telephone Encounter (Signed)
Do you know which medications she is referring to? If benzonatate then we can refills, if she is referring to prednisone we do not need to refill that. She is scheduled for CT scan tomorrow, I am waiting on those results to help determine the exact cause of her cough.

## 2020-05-23 NOTE — Telephone Encounter (Signed)
Tried to call pt to clarify but numbers are not good.

## 2020-05-24 ENCOUNTER — Other Ambulatory Visit: Payer: Self-pay

## 2020-05-24 ENCOUNTER — Ambulatory Visit
Admission: RE | Admit: 2020-05-24 | Discharge: 2020-05-24 | Disposition: A | Discharge status: Home / Self Care | Payer: BC Managed Care – PPO | Source: Ambulatory Visit | Attending: Hospice and Palliative Medicine | Admitting: Hospice and Palliative Medicine

## 2020-05-24 DIAGNOSIS — R053 Chronic cough: Secondary | ICD-10-CM | POA: Diagnosis not present

## 2020-05-24 DIAGNOSIS — I313 Pericardial effusion (noninflammatory): Secondary | ICD-10-CM | POA: Diagnosis not present

## 2020-05-24 DIAGNOSIS — R059 Cough, unspecified: Secondary | ICD-10-CM | POA: Diagnosis not present

## 2020-05-24 DIAGNOSIS — K449 Diaphragmatic hernia without obstruction or gangrene: Secondary | ICD-10-CM | POA: Diagnosis not present

## 2020-05-24 DIAGNOSIS — U099 Post covid-19 condition, unspecified: Secondary | ICD-10-CM | POA: Diagnosis not present

## 2020-05-24 DIAGNOSIS — Z8701 Personal history of pneumonia (recurrent): Secondary | ICD-10-CM | POA: Diagnosis not present

## 2020-05-26 ENCOUNTER — Other Ambulatory Visit: Payer: Self-pay | Admitting: Nurse Practitioner

## 2020-05-26 DIAGNOSIS — I10 Essential (primary) hypertension: Secondary | ICD-10-CM

## 2020-05-27 ENCOUNTER — Other Ambulatory Visit: Payer: Self-pay | Admitting: Hospice and Palliative Medicine

## 2020-05-27 ENCOUNTER — Ambulatory Visit: Payer: BC Managed Care – PPO | Admitting: Internal Medicine

## 2020-05-27 ENCOUNTER — Encounter: Payer: Self-pay | Admitting: Physician Assistant

## 2020-05-27 ENCOUNTER — Other Ambulatory Visit: Payer: Self-pay

## 2020-05-27 VITALS — BP 140/88 | HR 75 | Temp 97.9°F | Resp 16 | Ht 66.0 in | Wt 287.0 lb

## 2020-05-27 DIAGNOSIS — G4734 Idiopathic sleep related nonobstructive alveolar hypoventilation: Secondary | ICD-10-CM

## 2020-05-27 DIAGNOSIS — J45991 Cough variant asthma: Secondary | ICD-10-CM

## 2020-05-27 DIAGNOSIS — F419 Anxiety disorder, unspecified: Secondary | ICD-10-CM

## 2020-05-27 DIAGNOSIS — I10 Essential (primary) hypertension: Secondary | ICD-10-CM

## 2020-05-27 DIAGNOSIS — I313 Pericardial effusion (noninflammatory): Secondary | ICD-10-CM

## 2020-05-27 DIAGNOSIS — F411 Generalized anxiety disorder: Secondary | ICD-10-CM | POA: Diagnosis not present

## 2020-05-27 DIAGNOSIS — I3139 Other pericardial effusion (noninflammatory): Secondary | ICD-10-CM

## 2020-05-27 MED ORDER — ALPRAZOLAM 0.25 MG PO TABS: ORAL_TABLET | ORAL | 0 refills | Status: DC

## 2020-05-27 MED ORDER — ESCITALOPRAM OXALATE 10 MG PO TABS: ORAL_TABLET | ORAL | 3 refills | Status: DC

## 2020-05-27 NOTE — Progress Notes (Signed)
[facility] [place], [place] [address]  Internal MEDICINE  Office Visit Note  Patient Name: Shelley [name]  [id number]  [id number]  Date of Service: 05/29/2020  Chief Complaint  Patient presents with   Anxiety   Hypertension    HPI  Pt is here with her daughter after having an episode of anxiety ( felt dizzy and closed in). She had to leave work. She went to ED with same episode and was given antivert with some relief. She had a cardiac cath done which was normal in 2021 with mild pulmonary HTN Echocardiogram is normal as well She is having chronic cough and had CT chest done with follow up with Pulmonary. She is on Losartan for BP control  IMPRESSION: 1. No acute cardiopulmonary process. 2. Mild cardiomegaly with trace pericardial effusion. 3. Mild symmetric bilateral axillary adenopathy, likely reactive. 4. Small hiatal hernia.  BP is noted to be elevated on multiple times, Home sleep study was not significant for OSA but did have hypoxia and is now on O2   Current Medication: Outpatient Encounter Medications as of 05/27/2020  Medication Sig   albuterol (VENTOLIN HFA) 108 (90 Base) MCG/ACT inhaler Inhale 2 puffs into the lungs every 6 (six) hours as needed for wheezing or shortness of breath.   escitalopram (LEXAPRO) 10 MG tablet Take one tab po qd with supper for panic attacks   levonorgestrel (MIRENA) 20 MCG/24HR IUD 20 mcg by Intrauterine route.   losartan (COZAAR) 25 MG tablet Take 2 tablets (50 mg total) by mouth daily.   meclizine (ANTIVERT) 25 MG tablet Take 25 mg by mouth 3 (three) times daily as needed.   meloxicam (MOBIC) 15 MG tablet Take 1 tablet (15 mg total) by mouth daily as needed.   Multiple Vitamin (MULTIVITAMIN) tablet Take 1 tablet by mouth daily.   [DISCONTINUED] ALPRAZolam (XANAX) 0.25 MG tablet Take 1 tablet po QD PRN   [DISCONTINUED] benzonatate (TESSALON) 100 MG capsule Take 1 capsule (100 mg total) by mouth 2  (two) times daily as needed for cough.   [DISCONTINUED] predniSONE (DELTASONE) 10 MG tablet Take 1 tablet three times a day with a meal for three for three days, take 1 tablet by twice daily with a meal for 3 days, take 1 tablet once daily with a meal for 3 days   [DISCONTINUED] SYNTHROID 25 MCG tablet Take 25 mcg by mouth daily.   ALPRAZolam (XANAX) 0.25 MG tablet Take half tab twice a day as needed for severe panic attacks   Facility-Administered Encounter Medications as of 05/27/2020  Medication   sodium chloride flush (NS) 0.9 % injection 3 mL    Surgical History: Past Surgical History:  Procedure Laterality Date   RIGHT/LEFT HEART CATH AND CORONARY ANGIOGRAPHY Bilateral 05/26/2019   Procedure: RIGHT/LEFT HEART CATH AND CORONARY ANGIOGRAPHY;  Surgeon: Nelva Bush, MD;  Location: [facility];  Service: Cardiovascular;  Laterality: Bilateral;   TUBAL LIGATION      Medical History: Past Medical History:  Diagnosis Date   Anemia    Anxiety    Arthritis    Cardiomegaly    Hypertension    Pulmonary hypertension ([place])    Thyroid disease    Vertigo     Family History: Family History  Problem Relation Age of Onset   Cancer Other    Breast cancer Maternal Grandmother    Breast cancer Cousin        1st maternal cousin   Heart attack Mother  Cancer Sister     Social History   Socioeconomic History   Marital status: Single    Spouse name: Not on file   Number of children: Not on file   Years of education: Not on file   Highest education level: Not on file  Occupational History   Not on file  Tobacco Use   Smoking status: Never Smoker   Smokeless tobacco: Never Used  Vaping Use   Vaping Use: Never used  Substance and Sexual Activity   Alcohol use: No   Drug use: No   Sexual activity: Yes    Birth control/protection: I.U.D.  Other Topics Concern   Not on file  Social History Narrative   Not on file   Social  Determinants of Health   Financial Resource Strain: Not on file  Food Insecurity: Not on file  Transportation Needs: Not on file  Physical Activity: Not on file  Stress: Not on file  Social Connections: Not on file  Intimate Partner Violence: Not on file      Review of Systems  Constitutional: Negative for chills, diaphoresis and fatigue.  HENT: Negative for ear pain, postnasal drip and sinus pressure.   Eyes: Negative for photophobia, discharge, redness, itching and visual disturbance.  Respiratory: Positive for cough. Negative for shortness of breath and wheezing.   Cardiovascular: Negative for chest pain, palpitations and leg swelling.  Gastrointestinal: Negative for abdominal pain, constipation, diarrhea, nausea and vomiting.  Genitourinary: Negative for dysuria and flank pain.  Musculoskeletal: Negative for arthralgias, back pain, gait problem and neck pain.  Skin: Negative for color change.  Allergic/Immunologic: Negative for environmental allergies and food allergies.  Neurological: Positive for dizziness. Negative for headaches.  Hematological: Does not bruise/bleed easily.  Psychiatric/Behavioral: Positive for sleep disturbance. Negative for agitation, behavioral problems (depression) and hallucinations. The patient is nervous/anxious.     Vital Signs: BP 140/88    Pulse 75    Temp 97.9 F (36.6 C)    Resp 16    Ht 5\' 6"  (1.676 m)    Wt 287 lb (130.2 kg)    SpO2 99%    BMI 46.32 kg/m    Physical Exam Constitutional:      General: She is not in acute distress.    Appearance: She is well-developed. She is not diaphoretic.  HENT:     Head: Normocephalic and atraumatic.     Mouth/Throat:     Pharynx: No oropharyngeal exudate.  Eyes:     Pupils: Pupils are equal, round, and reactive to light.  Neck:     Thyroid: No thyromegaly.     Vascular: No JVD.     Trachea: No tracheal deviation.  Cardiovascular:     Rate and Rhythm: Normal rate and regular rhythm.      Heart sounds: Normal heart sounds. No murmur heard. No friction rub. No gallop.   Pulmonary:     Effort: Pulmonary effort is normal. No respiratory distress.     Breath sounds: No wheezing or rales.  Chest:     Chest wall: No tenderness.  Abdominal:     General: Bowel sounds are normal.     Palpations: Abdomen is soft.  Musculoskeletal:        General: Normal range of motion.     Cervical back: Normal range of motion and neck supple.  Lymphadenopathy:     Cervical: No cervical adenopathy.  Skin:    General: Skin is warm and dry.  Neurological:  Mental Status: She is alert and oriented to person, place, and time.     Cranial Nerves: No cranial nerve deficit.  Psychiatric:        Behavior: Behavior normal.        Thought Content: Thought content normal.        Judgment: Judgment normal.        Assessment/Plan: 1. Cough variant asthma Pt continues to have minor cough, postcovid?? CT reviewed. She has a follow up with pulmonary, look into switching her Losartan ?? Sarcoid  - ANA w/Reflex if Positive; Future - Sedimentation rate; Future  2. GAD (generalized anxiety disorder) Start Lexapro and add alprazolam prn only  - ALPRAZolam (XANAX) 0.25 MG tablet; Take half tab twice a day as needed for severe panic attacks  Dispense: 30 tablet; Refill: 0  3. Idiopathic pericardial effusion Order labs for  CTD workup  - ANA w/Reflex if Positive; Future - Sedimentation rate; Future  4. Uncontrolled hypertension Pt is instructed to increase her Losartan 25 mg to 3 tabs ( 75 mg once a day) , however if worsening cough might need change in therapy   5. Nocturnal hypoxia Pt had home sleep study and is on O2 at night, however with uncontrolled HTN and mild pulmonary hypertension, she will get benfit from in lab sleep study   General Counseling: Jazell verbalizes understanding of the findings of todays visit and agrees with plan of treatment. I have discussed any further diagnostic  evaluation that may be needed or ordered today. We also reviewed her medications today. she has been encouraged to call the office with any questions or concerns that should arise related to todays visit.    Orders Placed This Encounter  Procedures   ANA w/Reflex if Positive    Standing Status:   Future    Standing Expiration Date:   05/29/2021   Sedimentation rate    Standing Status:   Future    Standing Expiration Date:   05/29/2021    Meds ordered this encounter  Medications   escitalopram (LEXAPRO) 10 MG tablet    Sig: Take one tab po qd with supper for panic attacks    Dispense:  30 tablet    Refill:  3   ALPRAZolam (XANAX) 0.25 MG tablet    Sig: Take half tab twice a day as needed for severe panic attacks    Dispense:  30 tablet    Refill:  0    Total time spent: 35 Minutes Time spent includes review of chart, medications, test results, and follow up plan with the patient.   [place] Controlled Substance Database was reviewed by me.   Dr Lavera Guise Internal medicine

## 2020-05-28 ENCOUNTER — Ambulatory Visit: Payer: BC Managed Care – PPO | Admitting: Internal Medicine

## 2020-05-28 ENCOUNTER — Encounter: Payer: Self-pay | Admitting: Internal Medicine

## 2020-05-28 VITALS — BP 140/90 | HR 90 | Temp 97.3°F | Resp 16 | Ht 66.0 in | Wt 288.6 lb

## 2020-05-28 DIAGNOSIS — F419 Anxiety disorder, unspecified: Secondary | ICD-10-CM

## 2020-05-28 DIAGNOSIS — R0602 Shortness of breath: Secondary | ICD-10-CM

## 2020-05-28 DIAGNOSIS — K219 Gastro-esophageal reflux disease without esophagitis: Secondary | ICD-10-CM

## 2020-05-28 DIAGNOSIS — J45991 Cough variant asthma: Secondary | ICD-10-CM

## 2020-05-28 NOTE — Progress Notes (Signed)
Please review

## 2020-05-28 NOTE — Progress Notes (Signed)
Might need connective tissue disease work up and in lab sleep study

## 2020-05-28 NOTE — Patient Instructions (Signed)

## 2020-05-28 NOTE — Progress Notes (Signed)
[facility] [place], [place] [address]  Pulmonary Sleep Medicine   Office Visit Note  Patient Name: Shelley [name] DOB: 1960-12-03 MRN [id number]  Date of Service: 05/28/2020  Complaints/HPI: Had CT of chest. The scan results were reviewed and overall looks good. The patient has ongoing cough noted. Patient has had a full work-up and there is really nothing conclusive.  More than likely there is a component of asthma cough variant as well as short the shortness of breath due to anxiety.  ROS  General: (-) fever, (-) chills, (-) night sweats, (-) weakness Skin: (-) rashes, (-) itching,. Eyes: (-) visual changes, (-) redness, (-) itching. Nose and Sinuses: (-) nasal stuffiness or itchiness, (-) postnasal drip, (-) nosebleeds, (-) sinus trouble. Mouth and Throat: (-) sore throat, (-) hoarseness. Neck: (-) swollen glands, (-) enlarged thyroid, (-) neck pain. Respiratory: + cough, (-) bloody sputum, - shortness of breath, - wheezing. Cardiovascular: - ankle swelling, (-) chest pain. Lymphatic: (-) lymph node enlargement. Neurologic: (-) numbness, (-) tingling. Psychiatric: (-) anxiety, (-) depression   Current Medication: Outpatient Encounter Medications as of 05/28/2020  Medication Sig  . albuterol (VENTOLIN HFA) 108 (90 Base) MCG/ACT inhaler Inhale 2 puffs into the lungs every 6 (six) hours as needed for wheezing or shortness of breath.  . ALPRAZolam (XANAX) 0.25 MG tablet Take half tab twice a day as needed for severe panic attacks  . escitalopram (LEXAPRO) 10 MG tablet Take one tab po qd with supper for panic attacks  . levonorgestrel (MIRENA) 20 MCG/24HR IUD 20 mcg by Intrauterine route.  Marland Kitchen losartan (COZAAR) 25 MG tablet Take 2 tablets (50 mg total) by mouth daily.  . meclizine (ANTIVERT) 25 MG tablet Take 25 mg by mouth 3 (three) times daily as needed.  . meloxicam (MOBIC) 15 MG tablet Take 1 tablet (15 mg total) by mouth daily as needed.  .  Multiple Vitamin (MULTIVITAMIN) tablet Take 1 tablet by mouth daily.   Facility-Administered Encounter Medications as of 05/28/2020  Medication  . sodium chloride flush (NS) 0.9 % injection 3 mL    Surgical History: Past Surgical History:  Procedure Laterality Date  . RIGHT/LEFT HEART CATH AND CORONARY ANGIOGRAPHY Bilateral 05/26/2019   Procedure: RIGHT/LEFT HEART CATH AND CORONARY ANGIOGRAPHY;  Surgeon: Nelva Bush, MD;  Location: [place] [facility];  Service: Cardiovascular;  Laterality: Bilateral;  . TUBAL LIGATION      Medical History: Past Medical History:  Diagnosis Date  . Anemia   . Anxiety   . Arthritis   . Cardiomegaly   . Hypertension   . Pulmonary hypertension ([place])   . Thyroid disease   . Vertigo     Family History: Family History  Problem Relation Age of Onset  . Cancer Other   . Breast cancer Maternal Grandmother   . Breast cancer Cousin        1st maternal cousin  . Heart attack Mother   . Cancer Sister     Social History: Social History   Socioeconomic History  . Marital status: Single    Spouse name: Not on file  . Number of children: Not on file  . Years of education: Not on file  . Highest education level: Not on file  Occupational History  . Not on file  Tobacco Use  . Smoking status: Never Smoker  . Smokeless tobacco: Never Used  Vaping Use  . Vaping Use: Never used  Substance and Sexual Activity  . Alcohol use: No  .  Drug use: No  . Sexual activity: Yes    Birth control/protection: I.U.D.  Other Topics Concern  . Not on file  Social History Narrative  . Not on file   Social Determinants of Health   Financial Resource Strain: Not on file  Food Insecurity: Not on file  Transportation Needs: Not on file  Physical Activity: Not on file  Stress: Not on file  Social Connections: Not on file  Intimate Partner Violence: Not on file    Vital Signs: Blood pressure 140/90, pulse 90, temperature (!) 97.3 F (36.3 C), resp.  rate 16, height 5\' 6"  (1.676 m), weight 288 lb 9.6 oz (130.9 kg), SpO2 96 %.  Examination: General Appearance: The patient is well-developed, well-nourished, and in no distress. Skin: Gross inspection of skin unremarkable. Head: normocephalic, no gross deformities. Eyes: no gross deformities noted. ENT: ears appear grossly normal no exudates. Neck: Supple. No thyromegaly. No LAD. Respiratory: no rhonchi. Cardiovascular: Normal S1 and S2 without murmur or rub. Extremities: No cyanosis. pulses are equal. Neurologic: Alert and oriented. No involuntary movements.  LABS: No results found for this or any previous visit (from the past 2160 hour(s)).  Radiology: CT Chest Wo Contrast  Result Date: 05/27/2020 CLINICAL DATA:  Chronic cough post COVID pneumonia. EXAM: CT CHEST WITHOUT CONTRAST TECHNIQUE: Multidetector CT imaging of the chest was performed following the standard protocol without IV contrast. COMPARISON:  None. FINDINGS: Cardiovascular: The heart size is mildly enlarged. There is a trace pericardial effusion. There is no evidence for thoracic aortic aneurysm. Mediastinum/Nodes: -- No mediastinal lymphadenopathy. -- No hilar lymphadenopathy. --there is mild symmetric bilateral axillary adenopathy. -- No supraclavicular lymphadenopathy. -- Normal thyroid gland where visualized. -there is a small hiatal hernia. Lungs/Pleura: Airways are patent. No pleural effusion, lobar consolidation, pneumothorax or pulmonary infarction. Exam was somewhat limited by respiratory motion artifact. Upper Abdomen: There is no acute abnormality in the upper abdomen. The spleen is irregular and small. This may related to prior auto infarction. Musculoskeletal: No chest wall abnormality. No bony spinal canal stenosis. IMPRESSION: 1. No acute cardiopulmonary process. 2. Mild cardiomegaly with trace pericardial effusion. 3. Mild symmetric bilateral axillary adenopathy, likely reactive. 4. Small hiatal hernia.  Electronically Signed   By: Constance Holster M.D.   On: 05/27/2020 12:30    CT Chest Wo Contrast  Result Date: 05/27/2020 CLINICAL DATA:  Chronic cough post COVID pneumonia. EXAM: CT CHEST WITHOUT CONTRAST TECHNIQUE: Multidetector CT imaging of the chest was performed following the standard protocol without IV contrast. COMPARISON:  None. FINDINGS: Cardiovascular: The heart size is mildly enlarged. There is a trace pericardial effusion. There is no evidence for thoracic aortic aneurysm. Mediastinum/Nodes: -- No mediastinal lymphadenopathy. -- No hilar lymphadenopathy. --there is mild symmetric bilateral axillary adenopathy. -- No supraclavicular lymphadenopathy. -- Normal thyroid gland where visualized. -there is a small hiatal hernia. Lungs/Pleura: Airways are patent. No pleural effusion, lobar consolidation, pneumothorax or pulmonary infarction. Exam was somewhat limited by respiratory motion artifact. Upper Abdomen: There is no acute abnormality in the upper abdomen. The spleen is irregular and small. This may related to prior auto infarction. Musculoskeletal: No chest wall abnormality. No bony spinal canal stenosis. IMPRESSION: 1. No acute cardiopulmonary process. 2. Mild cardiomegaly with trace pericardial effusion. 3. Mild symmetric bilateral axillary adenopathy, likely reactive. 4. Small hiatal hernia. Electronically Signed   By: Constance Holster M.D.   On: 05/27/2020 12:30    CT Chest Wo Contrast  Result Date: 05/27/2020 CLINICAL DATA:  Chronic cough post COVID  pneumonia. EXAM: CT CHEST WITHOUT CONTRAST TECHNIQUE: Multidetector CT imaging of the chest was performed following the standard protocol without IV contrast. COMPARISON:  None. FINDINGS: Cardiovascular: The heart size is mildly enlarged. There is a trace pericardial effusion. There is no evidence for thoracic aortic aneurysm. Mediastinum/Nodes: -- No mediastinal lymphadenopathy. -- No hilar lymphadenopathy. --there is mild symmetric  bilateral axillary adenopathy. -- No supraclavicular lymphadenopathy. -- Normal thyroid gland where visualized. -there is a small hiatal hernia. Lungs/Pleura: Airways are patent. No pleural effusion, lobar consolidation, pneumothorax or pulmonary infarction. Exam was somewhat limited by respiratory motion artifact. Upper Abdomen: There is no acute abnormality in the upper abdomen. The spleen is irregular and small. This may related to prior auto infarction. Musculoskeletal: No chest wall abnormality. No bony spinal canal stenosis. IMPRESSION: 1. No acute cardiopulmonary process. 2. Mild cardiomegaly with trace pericardial effusion. 3. Mild symmetric bilateral axillary adenopathy, likely reactive. 4. Small hiatal hernia. Electronically Signed   By: Constance Holster M.D.   On: 05/27/2020 12:30      Assessment and Plan: Patient Active Problem List   Diagnosis Date Noted  . Encounter for general adult medical examination with abnormal findings 06/19/2019  . Encounter for screening mammogram for malignant neoplasm of breast 06/19/2019  . Pulmonary hypertension ([place])   . Shortness of breath   . Pulmonary hypertension, primary ([place]) 01/04/2019  . Acute pain of left shoulder 11/23/2018  . Cardiomegaly 11/23/2018  . Abnormal ECG 11/23/2018  . Acquired hypothyroidism 05/22/2018  . Seasonal allergies 05/22/2018  . Acute anxiety 05/22/2018  . Dysuria 05/22/2018  . Hypertension 01/05/2018  . Routine cervical smear 01/05/2018  . Elevated ferritin 11/26/2015  . Thrombocytosis 11/26/2015    1. Cough variant asthma We will continue with present management she still has a little bit of a cough more than continue to follow she has had a thorough work-up done without anything distinct having been identified.  Obesity and reflux is likely playing a major role in her cough symptoms  2. Shortness of breath Multiple factors playing a role including the asthmatic component as well as her being  overweight.  3. Acute anxiety Medical management with primary care follow-up.  4. Gastroesophageal reflux disease without esophagitis PPI usage as indicated avoid overeating and work on diet and exercise and weight loss   General Counseling: I have discussed the findings of the evaluation and examination with Yasmine.  I have also discussed any further diagnostic evaluation thatmay be needed or ordered today. Kailana verbalizes understanding of the findings of todays visit. We also reviewed her medications today and discussed drug interactions and side effects including but not limited excessive drowsiness and altered mental states. We also discussed that there is always a risk not just to her but also people around her. she has been encouraged to call the office with any questions or concerns that should arise related to todays visit.  No orders of the defined types were placed in this encounter.    Time spent: 52  I have personally obtained a history, examined the patient, evaluated laboratory and imaging results, formulated the assessment and plan and placed orders.    Allyne Gee, MD [facility] Pulmonary and Critical Care Sleep medicine

## 2020-06-12 ENCOUNTER — Ambulatory Visit (INDEPENDENT_AMBULATORY_CARE_PROVIDER_SITE_OTHER): Payer: BC Managed Care – PPO | Admitting: Hospice and Palliative Medicine

## 2020-06-12 ENCOUNTER — Encounter: Payer: Self-pay | Admitting: Hospice and Palliative Medicine

## 2020-06-12 VITALS — BP 140/82 | HR 85 | Temp 97.1°F | Resp 16 | Ht 66.0 in | Wt 289.6 lb

## 2020-06-12 DIAGNOSIS — I3139 Other pericardial effusion (noninflammatory): Secondary | ICD-10-CM

## 2020-06-12 DIAGNOSIS — Z0001 Encounter for general adult medical examination with abnormal findings: Secondary | ICD-10-CM

## 2020-06-12 DIAGNOSIS — R053 Chronic cough: Secondary | ICD-10-CM

## 2020-06-12 DIAGNOSIS — R300 Dysuria: Secondary | ICD-10-CM | POA: Diagnosis not present

## 2020-06-12 DIAGNOSIS — K219 Gastro-esophageal reflux disease without esophagitis: Secondary | ICD-10-CM

## 2020-06-12 DIAGNOSIS — Z1231 Encounter for screening mammogram for malignant neoplasm of breast: Secondary | ICD-10-CM

## 2020-06-12 DIAGNOSIS — I313 Pericardial effusion (noninflammatory): Secondary | ICD-10-CM

## 2020-06-12 DIAGNOSIS — R5383 Other fatigue: Secondary | ICD-10-CM

## 2020-06-12 DIAGNOSIS — I10 Essential (primary) hypertension: Secondary | ICD-10-CM

## 2020-06-12 MED ORDER — DILTIAZEM HCL ER 60 MG PO CP12: 60.0000 mg | ORAL_CAPSULE | Freq: Two times a day (BID) | ORAL | 0 refills | Status: DC

## 2020-06-12 MED ORDER — OMEPRAZOLE 20 MG PO CPDR: 20.0000 mg | DELAYED_RELEASE_CAPSULE | Freq: Every day | ORAL | 0 refills | Status: DC

## 2020-06-12 NOTE — Progress Notes (Signed)
[facility] [place], [place] [address]  Internal MEDICINE  Office Visit Note  Patient Name: Shelley [name]  [id number]  [id number]  Date of Service: 06/14/2020  Chief Complaint  Patient presents with  . Annual Exam    Corners of mouth has sores  . Hypertension  . Anxiety  . Anemia     HPI Pt is here for routine health maintenance examination At last visit, started on Lexapro for GAD--symptoms are better controlled, no further panic episodes Has not had a chance to have labs for CTD work-up--small pleural effusion found on recent imaging Has been worked up for dry hacking cough--last visit losartan dosing increased and she has noticed an increase in frequency and severity of cough  Requesting GYN referral for IUD removal  Due to screening mammogram  Due for colonoscopy--prefers to be set up for Cologuard testing  Current Medication: Outpatient Encounter Medications as of 06/12/2020  Medication Sig  . diltiazem (CARDIZEM SR) 60 MG 12 hr capsule Take 1 capsule (60 mg total) by mouth 2 (two) times daily.  Marland Kitchen omeprazole (PRILOSEC) 20 MG capsule Take 1 capsule (20 mg total) by mouth daily.  Marland Kitchen albuterol (VENTOLIN HFA) 108 (90 Base) MCG/ACT inhaler Inhale 2 puffs into the lungs every 6 (six) hours as needed for wheezing or shortness of breath.  . ALPRAZolam (XANAX) 0.25 MG tablet Take half tab twice a day as needed for severe panic attacks  . escitalopram (LEXAPRO) 10 MG tablet Take one tab po qd with supper for panic attacks  . levonorgestrel (MIRENA) 20 MCG/24HR IUD 20 mcg by Intrauterine route.  Marland Kitchen losartan (COZAAR) 25 MG tablet Take 2 tablets (50 mg total) by mouth daily.  . meclizine (ANTIVERT) 25 MG tablet Take 25 mg by mouth 3 (three) times daily as needed.  . meloxicam (MOBIC) 15 MG tablet Take 1 tablet (15 mg total) by mouth daily as needed.  . Multiple Vitamin (MULTIVITAMIN) tablet Take 1 tablet by mouth daily.   Facility-Administered Encounter  Medications as of 06/12/2020  Medication  . sodium chloride flush (NS) 0.9 % injection 3 mL    Surgical History: Past Surgical History:  Procedure Laterality Date  . RIGHT/LEFT HEART CATH AND CORONARY ANGIOGRAPHY Bilateral 05/26/2019   Procedure: RIGHT/LEFT HEART CATH AND CORONARY ANGIOGRAPHY;  Surgeon: Nelva Bush, MD;  Location: [place] [facility];  Service: Cardiovascular;  Laterality: Bilateral;  . TUBAL LIGATION      Medical History: Past Medical History:  Diagnosis Date  . Anemia   . Anxiety   . Arthritis   . Cardiomegaly   . Hypertension   . Pulmonary hypertension ([place])   . Thyroid disease   . Vertigo     Family History: Family History  Problem Relation Age of Onset  . Cancer Other   . Breast cancer Maternal Grandmother   . Breast cancer Cousin        1st maternal cousin  . Heart attack Mother   . Cancer Sister       Review of Systems  Constitutional: Negative for chills, diaphoresis and fatigue.  HENT: Negative for ear pain, postnasal drip and sinus pressure.   Eyes: Negative for photophobia, discharge, redness, itching and visual disturbance.  Respiratory: Positive for cough. Negative for shortness of breath and wheezing.   Cardiovascular: Negative for chest pain, palpitations and leg swelling.  Gastrointestinal: Negative for abdominal pain, constipation, diarrhea, nausea and vomiting.  Genitourinary: Negative for dysuria and flank pain.  Musculoskeletal: Negative for  arthralgias, back pain, gait problem and neck pain.  Skin: Negative for color change.  Allergic/Immunologic: Negative for environmental allergies and food allergies.  Neurological: Negative for dizziness and headaches.  Hematological: Does not bruise/bleed easily.  Psychiatric/Behavioral: Negative for agitation, behavioral problems (depression) and hallucinations.     Vital Signs: BP 140/82   Pulse 85   Temp (!) 97.1 F (36.2 C)   Resp 16   Ht 5' 6" (1.676 m)   Wt 289 lb 9.6 oz  (131.4 kg)   SpO2 97%   BMI 46.74 kg/m    Physical Exam Vitals reviewed.  Constitutional:      Appearance: Normal appearance. She is obese.  Cardiovascular:     Rate and Rhythm: Normal rate and regular rhythm.     Pulses: Normal pulses.     Heart sounds: Normal heart sounds.  Pulmonary:     Effort: Pulmonary effort is normal.     Breath sounds: Normal breath sounds.  Abdominal:     General: Abdomen is flat.     Palpations: Abdomen is soft.  Musculoskeletal:        General: Normal range of motion.     Cervical back: Normal range of motion.  Skin:    General: Skin is warm.  Neurological:     General: No focal deficit present.     Mental Status: She is alert and oriented to person, place, and time. Mental status is at baseline.  Psychiatric:        Mood and Affect: Mood normal.        Behavior: Behavior normal.        Thought Content: Thought content normal.        Judgment: Judgment normal.    LABS: Recent Results (from the past 2160 hour(s))  UA/M w/rflx Culture, Routine     Status: None   Collection Time: 06/12/20  9:37 AM   Specimen: Urine   Urine  Result Value Ref Range   Specific Gravity, UA 1.012 1.005 - 1.030   pH, UA 6.5 5.0 - 7.5   Color, UA Yellow Yellow   Appearance Ur Clear Clear   Leukocytes,UA Negative Negative   Protein,UA Negative Negative/Trace   Glucose, UA Negative Negative   Ketones, UA Negative Negative   RBC, UA Negative Negative   Bilirubin, UA Negative Negative   Urobilinogen, Ur 0.2 0.2 - 1.0 mg/dL   Nitrite, UA Negative Negative   Microscopic Examination Comment     Comment: Microscopic follows if indicated.   Microscopic Examination See below:     Comment: Microscopic was indicated and was performed.   Urinalysis Reflex Comment     Comment: This specimen will not reflex to a Urine Culture.  Microscopic Examination     Status: None   Collection Time: 06/12/20  9:37 AM   Urine  Result Value Ref Range   WBC, UA None seen 0 - 5 /hpf    RBC None seen 0 - 2 /hpf   Epithelial Cells (non renal) 0-10 0 - 10 /hpf   Casts None seen None seen /lpf   Bacteria, UA None seen None seen/Few     Assessment/Plan: 1. Encounter for routine adult health examination with abnormal findings Well appearing 60 year old [place] female Up to date on PHM--orders placed GYN referral for removal of IUD - Ambulatory referral to Gynecology  2. Encounter for screening mammogram for malignant neoplasm of breast - MM Digital Screening; Future  3. Idiopathic pericardial effusion Will review labs -  Sed Rate (ESR) - ANA Direct w/Reflex if Positive  4. Essential hypertension Discontinue losartan--avoid ARB and ACEI due to cough Unable to tolerate amlodipine in the past - diltiazem (CARDIZEM SR) 60 MG 12 hr capsule; Take 1 capsule (60 mg total) by mouth 2 (two) times daily.  Dispense: 60 capsule; Refill: 0  5. Persistent dry cough PFT-mild obstructive and restrictive lung disease Will discontinue ARB for HTN  6. Gastroesophageal reflux disease without esophagitis Symptoms well controlled, requesting refills - omeprazole (PRILOSEC) 20 MG capsule; Take 1 capsule (20 mg total) by mouth daily.  Dispense: 90 capsule; Refill: 0  7. Morbid obesity BMI 46, comorbid conditions include HTN and GERD Obesity Counseling: Risk Assessment: An assessment of behavioral risk factors was made today and includes lack of exercise sedentary lifestyle, lack of portion control and poor dietary habits.  Risk Modification Advice: She was counseled on portion control guidelines. Restricting daily caloric intake to 1800. The detrimental long term effects of obesity on her health and ongoing poor compliance was also discussed with the patient.  8. Dysuria - UA/M w/rflx Culture, Routine - Microscopic Examination  9. Other fatigue - CBC w/Diff/Platelet - Comprehensive Metabolic Panel (CMET) - Lipid Panel With LDL/HDL Ratio - TSH + free T4  General Counseling:  Haliegh verbalizes understanding of the findings of todays visit and agrees with plan of treatment. I have discussed any further diagnostic evaluation that may be needed or ordered today. We also reviewed her medications today. she has been encouraged to call the office with any questions or concerns that should arise related to todays visit.    Counseling:    Orders Placed This Encounter  Procedures  . Microscopic Examination  . MM Digital Screening  . UA/M w/rflx Culture, Routine  . CBC w/Diff/Platelet  . Comprehensive Metabolic Panel (CMET)  . Lipid Panel With LDL/HDL Ratio  . TSH + free T4  . Sed Rate (ESR)  . ANA Direct w/Reflex if Positive  . Ambulatory referral to Gynecology    Meds ordered this encounter  Medications  . diltiazem (CARDIZEM SR) 60 MG 12 hr capsule    Sig: Take 1 capsule (60 mg total) by mouth 2 (two) times daily.    Dispense:  60 capsule    Refill:  0  . omeprazole (PRILOSEC) 20 MG capsule    Sig: Take 1 capsule (20 mg total) by mouth daily.    Dispense:  90 capsule    Refill:  0    Total time spent: 30 Minutes  Time spent includes review of chart, medications, test results, and follow up plan with the patient.   This patient was seen by Theodoro Grist AGNP-C Collaboration with Dr Lavera Guise as a part of collaborative care agreement   Tanna Furry. [facility]

## 2020-06-13 LAB — UA/M W/RFLX CULTURE, ROUTINE
Bilirubin, UA: NEGATIVE
Glucose, UA: NEGATIVE
Ketones, UA: NEGATIVE
Leukocytes,UA: NEGATIVE
Nitrite, UA: NEGATIVE
Protein,UA: NEGATIVE
RBC, UA: NEGATIVE
Specific Gravity, UA: 1.012 (ref 1.005–1.030)
Urobilinogen, Ur: 0.2 mg/dL (ref 0.2–1.0)
pH, UA: 6.5 (ref 5.0–7.5)

## 2020-06-13 LAB — MICROSCOPIC EXAMINATION
Bacteria, UA: NONE SEEN
Casts: NONE SEEN /lpf
RBC, Urine: NONE SEEN /hpf (ref 0–2)
WBC, UA: NONE SEEN /hpf (ref 0–5)

## 2020-06-14 ENCOUNTER — Encounter: Payer: Self-pay | Admitting: Hospice and Palliative Medicine

## 2020-06-17 DIAGNOSIS — R5383 Other fatigue: Secondary | ICD-10-CM | POA: Diagnosis not present

## 2020-06-17 DIAGNOSIS — E782 Mixed hyperlipidemia: Secondary | ICD-10-CM | POA: Diagnosis not present

## 2020-06-18 LAB — ANA W/REFLEX IF POSITIVE: Anti Nuclear Antibody (ANA): NEGATIVE

## 2020-06-18 LAB — COMPREHENSIVE METABOLIC PANEL
ALT: 16 IU/L (ref 0–32)
AST: 16 IU/L (ref 0–40)
Albumin/Globulin Ratio: 1.3 (ref 1.2–2.2)
Albumin: 4.2 g/dL (ref 3.8–4.9)
Alkaline Phosphatase: 50 IU/L (ref 44–121)
BUN/Creatinine Ratio: 16 (ref 12–28)
BUN: 14 mg/dL (ref 8–27)
Bilirubin Total: 0.4 mg/dL (ref 0.0–1.2)
CO2: 23 mmol/L (ref 20–29)
Calcium: 9.2 mg/dL (ref 8.7–10.3)
Chloride: 102 mmol/L (ref 96–106)
Creatinine, Ser: 0.85 mg/dL (ref 0.57–1.00)
Globulin, Total: 3.2 g/dL (ref 1.5–4.5)
Glucose: 100 mg/dL — ABNORMAL HIGH (ref 65–99)
Potassium: 4.9 mmol/L (ref 3.5–5.2)
Sodium: 139 mmol/L (ref 134–144)
Total Protein: 7.4 g/dL (ref 6.0–8.5)
eGFR: 78 mL/min/{1.73_m2} (ref >59)

## 2020-06-18 LAB — LIPID PANEL WITH LDL/HDL RATIO
Cholesterol, Total: 148 mg/dL (ref 100–199)
HDL: 40 mg/dL (ref >39)
LDL Chol Calc (NIH): 90 mg/dL (ref 0–99)
LDL/HDL Ratio: 2.3 ratio (ref 0.0–3.2)
Triglycerides: 97 mg/dL (ref 0–149)
VLDL Cholesterol Cal: 18 mg/dL (ref 5–40)

## 2020-06-18 LAB — CBC WITH DIFFERENTIAL/PLATELET
Basophils Absolute: 0.1 10*3/uL (ref 0.0–0.2)
Basos: 1 %
EOS (ABSOLUTE): 0.3 10*3/uL (ref 0.0–0.4)
Eos: 3 %
Hematocrit: 40.6 % (ref 34.0–46.6)
Hemoglobin: 13.3 g/dL (ref 11.1–15.9)
Immature Grans (Abs): 0 10*3/uL (ref 0.0–0.1)
Immature Granulocytes: 0 %
Lymphocytes Absolute: 2.5 10*3/uL (ref 0.7–3.1)
Lymphs: 30 %
MCH: 28.9 pg (ref 26.6–33.0)
MCHC: 32.8 g/dL (ref 31.5–35.7)
MCV: 88 fL (ref 79–97)
Monocytes Absolute: 0.7 10*3/uL (ref 0.1–0.9)
Monocytes: 8 %
Neutrophils Absolute: 4.8 10*3/uL (ref 1.4–7.0)
Neutrophils: 58 %
Platelets: 367 10*3/uL (ref 150–450)
RBC: 4.61 x10E6/uL (ref 3.77–5.28)
RDW: 13.1 % (ref 11.7–15.4)
WBC: 8.3 10*3/uL (ref 3.4–10.8)

## 2020-06-18 LAB — TSH+FREE T4
Free T4: 1.17 ng/dL (ref 0.82–1.77)
TSH: 1.23 u[IU]/mL (ref 0.450–4.500)

## 2020-06-18 LAB — SEDIMENTATION RATE: Sed Rate: 2 mm/hr (ref 0–40)

## 2020-06-19 NOTE — Progress Notes (Signed)
Labs reviewed, will be discussed at upcoming visit.

## 2020-06-20 ENCOUNTER — Other Ambulatory Visit: Payer: Self-pay

## 2020-06-20 ENCOUNTER — Encounter: Payer: Self-pay | Admitting: Hospice and Palliative Medicine

## 2020-06-20 ENCOUNTER — Encounter: Payer: No Typology Code available for payment source | Admitting: Hospice and Palliative Medicine

## 2020-06-20 ENCOUNTER — Ambulatory Visit: Payer: BC Managed Care – PPO | Admitting: Hospice and Palliative Medicine

## 2020-06-20 VITALS — BP 138/84 | HR 80 | Temp 97.2°F | Resp 16 | Ht 66.0 in | Wt 288.0 lb

## 2020-06-20 DIAGNOSIS — G4734 Idiopathic sleep related nonobstructive alveolar hypoventilation: Secondary | ICD-10-CM

## 2020-06-20 DIAGNOSIS — R5383 Other fatigue: Secondary | ICD-10-CM | POA: Diagnosis not present

## 2020-06-20 DIAGNOSIS — I10 Essential (primary) hypertension: Secondary | ICD-10-CM

## 2020-06-20 DIAGNOSIS — K130 Diseases of lips: Secondary | ICD-10-CM

## 2020-06-20 MED ORDER — MICONAZOLE NITRATE 2 % EX CREA: 1.0000 "application " | TOPICAL_CREAM | Freq: Two times a day (BID) | CUTANEOUS | 0 refills | Status: DC

## 2020-06-20 NOTE — Progress Notes (Signed)
[facility] [place], [place] [address]  Internal MEDICINE  Office Visit Note  Patient Name: Shelley [name]  [id number]  [id number]  Date of Service: 06/20/2020  Chief Complaint  Patient presents with  . Follow-up  . Hypertension  . Anemia  . Asthma  . Anxiety    HPI Patient is here for routine follow-up Here for BP management, ARB discontinued at last visit due to possibility of contributing to persistent dry cough Has not noticed a significant improvement yet in her cough since discontinuing medication BP well controlled since therapy change Complaining of dry cracker corners of her mouth--has been applying chap stick and Vaseline--has been seen by dermatologist in the past and was told she had chelitis, given prescription cream at that time, unaware of name of cream, was several years ago   Continues to wear 2lpm supplemental oxygen at night without complication  Complaining of ongoing fatigue, reports sleeping well at night, denies lower extremity discomfort or restlessness   Current Medication: Outpatient Encounter Medications as of 06/20/2020  Medication Sig  . miconazole (MICATIN) 2 % cream Apply 1 application topically 2 (two) times daily.  Marland Kitchen albuterol (VENTOLIN HFA) 108 (90 Base) MCG/ACT inhaler Inhale 2 puffs into the lungs every 6 (six) hours as needed for wheezing or shortness of breath.  . ALPRAZolam (XANAX) 0.25 MG tablet Take half tab twice a day as needed for severe panic attacks  . diltiazem (CARDIZEM SR) 60 MG 12 hr capsule Take 1 capsule (60 mg total) by mouth 2 (two) times daily.  Marland Kitchen escitalopram (LEXAPRO) 10 MG tablet Take one tab po qd with supper for panic attacks  . levonorgestrel (MIRENA) 20 MCG/24HR IUD 20 mcg by Intrauterine route.  Marland Kitchen losartan (COZAAR) 25 MG tablet Take 2 tablets (50 mg total) by mouth daily.  . meclizine (ANTIVERT) 25 MG tablet Take 25 mg by mouth 3 (three) times daily as needed.  . meloxicam (MOBIC) 15  MG tablet Take 1 tablet (15 mg total) by mouth daily as needed.  . Multiple Vitamin (MULTIVITAMIN) tablet Take 1 tablet by mouth daily.  Marland Kitchen omeprazole (PRILOSEC) 20 MG capsule Take 1 capsule (20 mg total) by mouth daily.   Facility-Administered Encounter Medications as of 06/20/2020  Medication  . sodium chloride flush (NS) 0.9 % injection 3 mL    Surgical History: Past Surgical History:  Procedure Laterality Date  . RIGHT/LEFT HEART CATH AND CORONARY ANGIOGRAPHY Bilateral 05/26/2019   Procedure: RIGHT/LEFT HEART CATH AND CORONARY ANGIOGRAPHY;  Surgeon: Nelva Bush, MD;  Location: [facility];  Service: Cardiovascular;  Laterality: Bilateral;  . TUBAL LIGATION      Medical History: Past Medical History:  Diagnosis Date  . Anemia   . Anxiety   . Arthritis   . Cardiomegaly   . Hypertension   . Pulmonary hypertension ([place])   . Thyroid disease   . Vertigo     Family History: Family History  Problem Relation Age of Onset  . Cancer Other   . Breast cancer Maternal Grandmother   . Breast cancer Cousin        1st maternal cousin  . Heart attack Mother   . Cancer Sister     Social History   Socioeconomic History  . Marital status: Single    Spouse name: Not on file  . Number of children: Not on file  . Years of education: Not on file  . Highest education level: Not on file  Occupational History  .  Not on file  Tobacco Use  . Smoking status: Never Smoker  . Smokeless tobacco: Never Used  Vaping Use  . Vaping Use: Never used  Substance and Sexual Activity  . Alcohol use: No  . Drug use: No  . Sexual activity: Yes    Birth control/protection: I.U.D.  Other Topics Concern  . Not on file  Social History Narrative  . Not on file   Social Determinants of Health   Financial Resource Strain: Not on file  Food Insecurity: Not on file  Transportation Needs: Not on file  Physical Activity: Not on file  Stress: Not on file  Social Connections: Not on file   Intimate Partner Violence: Not on file       Review of Systems  Constitutional: Negative for chills, diaphoresis and fatigue.  HENT: Negative for ear pain, postnasal drip and sinus pressure.   Eyes: Negative for photophobia, discharge, redness, itching and visual disturbance.  Respiratory: Negative for cough, shortness of breath and wheezing.   Cardiovascular: Negative for chest pain, palpitations and leg swelling.  Gastrointestinal: Negative for abdominal pain, constipation, diarrhea, nausea and vomiting.  Genitourinary: Negative for dysuria and flank pain.  Musculoskeletal: Negative for arthralgias, back pain, gait problem and neck pain.  Skin: Negative for color change.  Allergic/Immunologic: Negative for environmental allergies and food allergies.  Neurological: Negative for dizziness and headaches.  Hematological: Does not bruise/bleed easily.  Psychiatric/Behavioral: Negative for agitation, behavioral problems (depression) and hallucinations.    Vital Signs: BP 138/84   Pulse 80   Temp (!) 97.2 F (36.2 C)   Resp 16   Ht 5\' 6"  (1.676 m)   Wt 288 lb (130.6 kg)   SpO2 97%   BMI 46.48 kg/m    Physical Exam Vitals reviewed.  Constitutional:      Appearance: Normal appearance. She is obese.  Cardiovascular:     Rate and Rhythm: Normal rate and regular rhythm.     Pulses: Normal pulses.     Heart sounds: Normal heart sounds.  Pulmonary:     Effort: Pulmonary effort is normal.     Breath sounds: Normal breath sounds.  Abdominal:     General: Abdomen is flat.     Palpations: Abdomen is soft.  Musculoskeletal:        General: Normal range of motion.     Cervical back: Normal range of motion.  Skin:    General: Skin is warm.  Neurological:     General: No focal deficit present.     Mental Status: She is alert and oriented to person, place, and time. Mental status is at baseline.  Psychiatric:        Mood and Affect: Mood normal.        Behavior: Behavior  normal.        Thought Content: Thought content normal.        Judgment: Judgment normal.    Assessment/Plan: 1. Cheilitis May apply cream to affected areas twice daily, if symptoms have not improved or worsened with antifungal cream consider MRSA treatment coverage - miconazole (MICATIN) 2 % cream; Apply 1 application topically 2 (two) times daily.  Dispense: 28.35 g; Refill: 0  2. Nocturnal hypoxia Due to increased levels of fatigue will review ONO while wearing currently prescribed oxygen parameters - Pulse oximetry, overnight; Future  3. Essential hypertension BP and HR well controlled with change in therapy, continue to monitor  4. Other fatigue - Vitamin B12 - Folate - Iron and TIBC -  Ferritin  General Counseling: Meyer verbalizes understanding of the findings of todays visit and agrees with plan of treatment. I have discussed any further diagnostic evaluation that may be needed or ordered today. We also reviewed her medications today. she has been encouraged to call the office with any questions or concerns that should arise related to todays visit.    Orders Placed This Encounter  Procedures  . Vitamin B12  . Folate  . Iron and TIBC  . Ferritin  . Pulse oximetry, overnight    Meds ordered this encounter  Medications  . miconazole (MICATIN) 2 % cream    Sig: Apply 1 application topically 2 (two) times daily.    Dispense:  28.35 g    Refill:  0    Time spent: 30 Minutes Time spent includes review of chart, medications, test results and follow-up plan with the patient.  This patient was seen by Theodoro Grist AGNP-C in Collaboration with Dr Lavera Guise as a part of collaborative care agreement     Tanna Furry. Thursa Emme AGNP-C Internal medicine

## 2020-06-27 ENCOUNTER — Encounter: Payer: Self-pay | Admitting: Hospice and Palliative Medicine

## 2020-06-27 ENCOUNTER — Encounter: Payer: Self-pay | Admitting: Obstetrics and Gynecology

## 2020-06-28 ENCOUNTER — Encounter: Payer: Self-pay | Admitting: Obstetrics and Gynecology

## 2020-06-28 ENCOUNTER — Other Ambulatory Visit: Payer: Self-pay

## 2020-06-28 ENCOUNTER — Ambulatory Visit: Payer: BC Managed Care – PPO | Admitting: Obstetrics and Gynecology

## 2020-06-28 VITALS — BP 134/86 | HR 86 | Ht 66.0 in | Wt 290.0 lb

## 2020-06-28 DIAGNOSIS — Z30432 Encounter for removal of intrauterine contraceptive device: Secondary | ICD-10-CM

## 2020-06-28 DIAGNOSIS — K130 Diseases of lips: Secondary | ICD-10-CM | POA: Diagnosis not present

## 2020-06-28 NOTE — Progress Notes (Signed)
History of Present Illness:  Shelley [name] is a 60 y.o. that had a Mirena IUD placed "several years ago," patient believes it might have been around 10 years. The IUD was placed for the management of irregular menstrual cycles. Since that time, she states that she has not experience any vaginal bleeding and denies any additional concerns today.  The following portions of the patient's history were reviewed and updated as appropriate: allergies, current medications, past family history, past medical history, past social history, past surgical history and problem list.  Patient Active Problem List   Diagnosis Date Noted  . Encounter for general adult medical examination with abnormal findings 06/19/2019  . Encounter for screening mammogram for malignant neoplasm of breast 06/19/2019  . Pulmonary hypertension ([place])   . Shortness of breath   . Pulmonary hypertension, primary ([place]) 01/04/2019  . Acute pain of left shoulder 11/23/2018  . Cardiomegaly 11/23/2018  . Abnormal ECG 11/23/2018  . Acquired hypothyroidism 05/22/2018  . Seasonal allergies 05/22/2018  . Acute anxiety 05/22/2018  . Dysuria 05/22/2018  . Hypertension 01/05/2018  . Routine cervical smear 01/05/2018  . Elevated ferritin 11/26/2015  . Thrombocytosis 11/26/2015   Medications:  Current Outpatient Medications on File Prior to Visit  Medication Sig Dispense Refill  . albuterol (VENTOLIN HFA) 108 (90 Base) MCG/ACT inhaler Inhale 2 puffs into the lungs every 6 (six) hours as needed for wheezing or shortness of breath. 8 g 0  . ALPRAZolam (XANAX) 0.25 MG tablet Take half tab twice a day as needed for severe panic attacks 30 tablet 0  . diltiazem (CARDIZEM SR) 60 MG 12 hr capsule Take 1 capsule (60 mg total) by mouth 2 (two) times daily. 60 capsule 0  . escitalopram (LEXAPRO) 10 MG tablet Take one tab po qd with supper for panic attacks 30 tablet 3  . Multiple Vitamin (MULTIVITAMIN) tablet Take 1 tablet by mouth daily.    Marland Kitchen  omeprazole (PRILOSEC) 20 MG capsule Take 1 capsule (20 mg total) by mouth daily. 90 capsule 0  . levonorgestrel (MIRENA) 20 MCG/24HR IUD 20 mcg by Intrauterine route.    Marland Kitchen losartan (COZAAR) 25 MG tablet Take 2 tablets (50 mg total) by mouth daily. (Patient not taking: Reported on 06/28/2020) 180 tablet 1  . meclizine (ANTIVERT) 25 MG tablet Take 25 mg by mouth 3 (three) times daily as needed.    . meloxicam (MOBIC) 15 MG tablet Take 1 tablet (15 mg total) by mouth daily as needed. (Patient not taking: Reported on 06/28/2020) 30 tablet 2  . miconazole (MICATIN) 2 % cream Apply 1 application topically 2 (two) times daily. (Patient not taking: Reported on 06/28/2020) 28.35 g 0   Current Facility-Administered Medications on File Prior to Visit  Medication Dose Route Frequency Provider Last Rate Last Admin  . sodium chloride flush (NS) 0.9 % injection 3 mL  3 mL Intravenous Q12H Kate Sable, MD       Allergies: is allergic to amlodipine.  Physical Exam:  BP 134/86   Pulse 86   Ht 5\' 6"  (1.676 m)   Wt 290 lb (131.5 kg)   BMI 46.81 kg/m  Body mass index is 46.81 kg/m. Constitutional: Well nourished, well developed female in no acute distress.  Abdomen: diffusely non tender to palpation, non distended, and no masses, hernias Neuro: Grossly intact Psych:  Normal mood and affect.    Pelvic exam:  Two IUD strings present seen coming from the cervical os. EGBUS, vaginal vault and cervix: within normal  limits  IUD Removal Strings of IUD identified and grasped.  IUD removed without problem.  Pt tolerated this well.  IUD noted to be intact.  Assessment: IUD Removal  Plan: IUD removed, no need for additional contraception or menstural cycle management at this time. Patient to follow-up if concerns regarding vaginal bleeding or as needed. She was amenable to this plan.  Orlie Pollen, CNM, MSN 06/28/2020 1:48 PM

## 2020-07-11 ENCOUNTER — Other Ambulatory Visit: Payer: Self-pay | Admitting: Hospice and Palliative Medicine

## 2020-07-11 DIAGNOSIS — I10 Essential (primary) hypertension: Secondary | ICD-10-CM

## 2020-07-15 ENCOUNTER — Encounter: Payer: Self-pay | Admitting: Obstetrics and Gynecology

## 2020-07-15 ENCOUNTER — Telehealth: Payer: Self-pay | Admitting: Obstetrics and Gynecology

## 2020-07-15 NOTE — Telephone Encounter (Signed)
Cancer genetic testing letter sent

## 2020-08-13 ENCOUNTER — Encounter: Payer: Self-pay | Admitting: Internal Medicine

## 2020-08-13 ENCOUNTER — Ambulatory Visit (INDEPENDENT_AMBULATORY_CARE_PROVIDER_SITE_OTHER): Payer: BC Managed Care – PPO | Admitting: Internal Medicine

## 2020-08-13 VITALS — BP 142/94 | HR 82 | Temp 98.4°F | Resp 16 | Ht 66.0 in | Wt 288.6 lb

## 2020-08-13 DIAGNOSIS — I10 Essential (primary) hypertension: Secondary | ICD-10-CM | POA: Diagnosis not present

## 2020-08-13 DIAGNOSIS — J45909 Unspecified asthma, uncomplicated: Secondary | ICD-10-CM | POA: Diagnosis not present

## 2020-08-13 DIAGNOSIS — J209 Acute bronchitis, unspecified: Secondary | ICD-10-CM

## 2020-08-13 DIAGNOSIS — R0602 Shortness of breath: Secondary | ICD-10-CM

## 2020-08-13 MED ORDER — AZITHROMYCIN 250 MG PO TABS: ORAL_TABLET | ORAL | 0 refills | Status: DC

## 2020-08-13 NOTE — Progress Notes (Signed)
[facility] [place], [place] [address]  Internal MEDICINE  Telephone Visit  Patient Name: Shelley [name]  [id number]  [id number]  Date of Service: 08/21/2020  I connected with the patient at [id number] telephone and verified the patients identity using two identifiers.   I discussed the limitations, risks, security and privacy concerns of performing an evaluation and management service by telephone and the availability of in person appointments. I also discussed with the patient that there may be a patient responsible charge related to the service.  The patient expressed understanding and agrees to proceed.    Chief Complaint  Patient presents with   Acute Visit    Persistent cough for 2 weeks    Telephone Screen    [phone]    Telephone Assessment    Video    Quality Metric Gaps    Pneumovax, shingrix, mammogram    HPI Patient is connected virtually for acute and sick visit, she continues to cough and has not been feeling well after initial evaluation patient was instructed to come to the office so she can be appropriately evaluated.  She is negative for COVID Blood pressure continues to be elevated, her BMI is also high   Current Medication: Outpatient Encounter Medications as of 08/13/2020  Medication Sig   albuterol (VENTOLIN HFA) 108 (90 Base) MCG/ACT inhaler Inhale 2 puffs into the lungs every 6 (six) hours as needed for wheezing or shortness of breath.   ALPRAZolam (XANAX) 0.25 MG tablet Take half tab twice a day as needed for severe panic attacks   azithromycin (ZITHROMAX) 250 MG tablet Take one tab a day for 10 days for uri   escitalopram (LEXAPRO) 10 MG tablet Take one tab po qd with supper for panic attacks   meclizine (ANTIVERT) 25 MG tablet Take 25 mg by mouth 3 (three) times daily as needed.   meloxicam (MOBIC) 15 MG tablet Take 1 tablet (15 mg total) by mouth daily as needed.   Multiple Vitamin (MULTIVITAMIN) tablet Take 1 tablet by  mouth daily.   omeprazole (PRILOSEC) 20 MG capsule Take 1 capsule (20 mg total) by mouth daily.   [DISCONTINUED] diltiazem (CARDIZEM SR) 60 MG 12 hr capsule Take 1 capsule by mouth twice daily   [DISCONTINUED] levonorgestrel (MIRENA) 20 MCG/24HR IUD 20 mcg by Intrauterine route. (Patient not taking: Reported on 08/13/2020)   [DISCONTINUED] losartan (COZAAR) 25 MG tablet Take 2 tablets (50 mg total) by mouth daily. (Patient not taking: Reported on 06/28/2020)   [DISCONTINUED] miconazole (MICATIN) 2 % cream Apply 1 application topically 2 (two) times daily. (Patient not taking: Reported on 06/28/2020)   [DISCONTINUED] sodium chloride flush (NS) 0.9 % injection 3 mL    No facility-administered encounter medications on file as of 08/13/2020.    Surgical History: Past Surgical History:  Procedure Laterality Date   RIGHT/LEFT HEART CATH AND CORONARY ANGIOGRAPHY Bilateral 05/26/2019   Procedure: RIGHT/LEFT HEART CATH AND CORONARY ANGIOGRAPHY;  Surgeon: Nelva Bush, MD;  Location: [facility];  Service: Cardiovascular;  Laterality: Bilateral;   TUBAL LIGATION      Medical History: Past Medical History:  Diagnosis Date   Anemia    Anxiety    Arthritis    Cardiomegaly    Family history of breast cancer    Family history of ovarian cancer    5/22 cancer genetic testing letter sent   Hypertension    Pulmonary hypertension ([place])    Thyroid disease    Vertigo  Family History: Family History  Problem Relation Age of Onset   Cancer Other    Breast cancer Maternal Grandmother    Breast cancer Cousin        1st maternal cousin   Heart attack Mother    Ovarian cancer Mother    Cancer Sister     Social History   Socioeconomic History   Marital status: Single    Spouse name: Not on file   Number of children: Not on file   Years of education: Not on file   Highest education level: Not on file  Occupational History   Not on file  Tobacco Use   Smoking status: Never    Smokeless tobacco: Never  Vaping Use   Vaping Use: Never used  Substance and Sexual Activity   Alcohol use: No   Drug use: No   Sexual activity: Yes    Birth control/protection: I.U.D.  Other Topics Concern   Not on file  Social History Narrative   Not on file   Social Determinants of Health   Financial Resource Strain: Not on file  Food Insecurity: Not on file  Transportation Needs: Not on file  Physical Activity: Not on file  Stress: Not on file  Social Connections: Not on file  Intimate Partner Violence: Not on file      Review of Systems  Constitutional:  Negative for fatigue and fever.  HENT:  Negative for congestion, mouth sores and postnasal drip.   Respiratory:  Positive for cough and wheezing.   Cardiovascular:  Negative for chest pain.  Genitourinary:  Negative for flank pain.  Psychiatric/Behavioral: Negative.     Vital Signs: BP (!) 142/94   Pulse 82   Temp 98.4 F (36.9 C)   Resp 16   Ht 5\' 6"  (1.676 m)   Wt 288 lb 9.6 oz (130.9 kg)   SpO2 92%   BMI 46.58 kg/m    Physical Exam Constitutional:      Appearance: She is ill-appearing.  HENT:     Head: Normocephalic and atraumatic.     Right Ear: Tympanic membrane normal.     Left Ear: Tympanic membrane normal.  Eyes:     Comments: Conjunctiva injected due to cough  Cardiovascular:     Rate and Rhythm: Normal rate.  Pulmonary:     Breath sounds: Wheezing present.  Musculoskeletal:     Cervical back: Normal range of motion.  Skin:    General: Skin is warm.  Neurological:     Mental Status: She is alert.  Psychiatric:        Mood and Affect: Mood normal.       Assessment/Plan: 1. Acute bronchitis with asthma Pt is wheezing, no h/o asthma, will give samples of Anoro one puff once a day, might need further work up.  - Spirometry with graph; Future - Spirometry with graph  2. Uncontrolled hypertension Pt is on low dose Cardizem which will need to be switched  on next visit  General  Counseling: [name] verbalizes understanding of the findings of today's phone visit and agrees with plan of treatment. I have discussed any further diagnostic evaluation that may be needed or ordered today. We also reviewed her medications today. she has been encouraged to call the office with any questions or concerns that should arise related to todays visit.    Orders Placed This Encounter  Procedures   Spirometry with graph    Meds ordered this encounter  Medications   azithromycin (ZITHROMAX)  250 MG tablet    Sig: Take one tab a day for 10 days for uri    Dispense:  10 tablet    Refill:  0    Time spent:25 Minutes    Dr Lavera Guise Internal medicine

## 2020-08-16 ENCOUNTER — Ambulatory Visit: Payer: BC Managed Care – PPO | Admitting: Physician Assistant

## 2020-08-19 DIAGNOSIS — E042 Nontoxic multinodular goiter: Secondary | ICD-10-CM | POA: Diagnosis not present

## 2020-08-19 DIAGNOSIS — E039 Hypothyroidism, unspecified: Secondary | ICD-10-CM | POA: Diagnosis not present

## 2020-08-19 DIAGNOSIS — E040 Nontoxic diffuse goiter: Secondary | ICD-10-CM | POA: Diagnosis not present

## 2020-08-19 DIAGNOSIS — E063 Autoimmune thyroiditis: Secondary | ICD-10-CM | POA: Diagnosis not present

## 2020-08-19 DIAGNOSIS — E6609 Other obesity due to excess calories: Secondary | ICD-10-CM | POA: Diagnosis not present

## 2020-08-19 DIAGNOSIS — I10 Essential (primary) hypertension: Secondary | ICD-10-CM | POA: Diagnosis not present

## 2020-08-19 DIAGNOSIS — M153 Secondary multiple arthritis: Secondary | ICD-10-CM | POA: Diagnosis not present

## 2020-08-20 ENCOUNTER — Ambulatory Visit: Payer: BC Managed Care – PPO | Admitting: Internal Medicine

## 2020-08-20 ENCOUNTER — Other Ambulatory Visit: Payer: Self-pay

## 2020-08-20 ENCOUNTER — Encounter: Payer: Self-pay | Admitting: Internal Medicine

## 2020-08-20 DIAGNOSIS — I10 Essential (primary) hypertension: Secondary | ICD-10-CM

## 2020-08-20 DIAGNOSIS — J209 Acute bronchitis, unspecified: Secondary | ICD-10-CM

## 2020-08-20 DIAGNOSIS — G479 Sleep disorder, unspecified: Secondary | ICD-10-CM

## 2020-08-20 DIAGNOSIS — Z6841 Body Mass Index (BMI) 40.0 and over, adult: Secondary | ICD-10-CM

## 2020-08-20 DIAGNOSIS — J45909 Unspecified asthma, uncomplicated: Secondary | ICD-10-CM

## 2020-08-20 MED ORDER — DILTIAZEM HCL ER COATED BEADS 180 MG PO CP24: 180.0000 mg | ORAL_CAPSULE | Freq: Every day | ORAL | 3 refills | Status: DC

## 2020-08-20 NOTE — Progress Notes (Signed)
[facility] [place], [place] [address]  Internal MEDICINE  Office Visit Note  Patient Name: Shelley [name]  [id number]  [id number]  Date of Service: 08/29/2020  Chief Complaint  Patient presents with   Follow-up    Review med   Hypertension   Anemia   Anxiety   Quality Metric Gaps    Shingrix, mammogram    HPI Patient is here for a follow-up after her sick visit, she was seen for acute bronchitis has finished a course of antibiotics and feels so much better. Patient is also here to discuss her overnight pulse oximetry which is significant for hypoxia she also did a home sleep study which was insignificant and nonconclusive at the time. Her blood pressure continues to be elevated, has elevated BMI. Does admit to snoring and sleep disturbance    Current Medication: Outpatient Encounter Medications as of 08/20/2020  Medication Sig   albuterol (VENTOLIN HFA) 108 (90 Base) MCG/ACT inhaler Inhale 2 puffs into the lungs every 6 (six) hours as needed for wheezing or shortness of breath.   ALPRAZolam (XANAX) 0.25 MG tablet Take half tab twice a day as needed for severe panic attacks   azithromycin (ZITHROMAX) 250 MG tablet Take one tab a day for 10 days for uri   diltiazem (CARDIZEM CD) 180 MG 24 hr capsule Take 1 capsule (180 mg total) by mouth daily.   escitalopram (LEXAPRO) 10 MG tablet Take one tab po qd with supper for panic attacks   meclizine (ANTIVERT) 25 MG tablet Take 25 mg by mouth 3 (three) times daily as needed.   meloxicam (MOBIC) 15 MG tablet Take 1 tablet (15 mg total) by mouth daily as needed.   Multiple Vitamin (MULTIVITAMIN) tablet Take 1 tablet by mouth daily.   omeprazole (PRILOSEC) 20 MG capsule Take 1 capsule (20 mg total) by mouth daily.   [DISCONTINUED] diltiazem (CARDIZEM SR) 60 MG 12 hr capsule Take 1 capsule by mouth twice daily   No facility-administered encounter medications on file as of 08/20/2020.    Surgical  History: Past Surgical History:  Procedure Laterality Date   RIGHT/LEFT HEART CATH AND CORONARY ANGIOGRAPHY Bilateral 05/26/2019   Procedure: RIGHT/LEFT HEART CATH AND CORONARY ANGIOGRAPHY;  Surgeon: Nelva Bush, MD;  Location: [facility];  Service: Cardiovascular;  Laterality: Bilateral;   TUBAL LIGATION      Medical History: Past Medical History:  Diagnosis Date   Anemia    Anxiety    Arthritis    Cardiomegaly    Family history of breast cancer    Family history of ovarian cancer    5/22 cancer genetic testing letter sent   Hypertension    Pulmonary hypertension ([place])    Thyroid disease    Vertigo     Family History: Family History  Problem Relation Age of Onset   Cancer Other    Breast cancer Maternal Grandmother    Breast cancer Cousin        1st maternal cousin   Heart attack Mother    Ovarian cancer Mother    Cancer Sister     Social History   Socioeconomic History   Marital status: Single    Spouse name: Not on file   Number of children: Not on file   Years of education: Not on file   Highest education level: Not on file  Occupational History   Not on file  Tobacco Use   Smoking status: Never   Smokeless tobacco: Never  Vaping Use   Vaping Use: Never used  Substance and Sexual Activity   Alcohol use: No   Drug use: No   Sexual activity: Yes    Birth control/protection: I.U.D.  Other Topics Concern   Not on file  Social History Narrative   Not on file   Social Determinants of Health   Financial Resource Strain: Not on file  Food Insecurity: Not on file  Transportation Needs: Not on file  Physical Activity: Not on file  Stress: Not on file  Social Connections: Not on file  Intimate Partner Violence: Not on file      Review of Systems  Constitutional:  Negative for fatigue and fever.  HENT:  Negative for congestion, mouth sores and postnasal drip.   Respiratory:  Negative for cough.   Cardiovascular:  Negative for chest  pain.       Elevated blood pressure   Genitourinary:  Negative for flank pain.  Psychiatric/Behavioral:  Positive for sleep disturbance. The patient is nervous/anxious.    Vital Signs: BP (!) 150/92   Pulse 80   Temp (!) 97.1 F (36.2 C)   Resp 16   Ht 5\' 6"  (1.676 m)   Wt 288 lb 12.8 oz (131 kg)   SpO2 98%   BMI 46.61 kg/m    Physical Exam Constitutional:      General: She is not in acute distress.    Appearance: She is well-developed. She is not diaphoretic.  HENT:     Head: Normocephalic and atraumatic.     Mouth/Throat:     Pharynx: No oropharyngeal exudate.  Eyes:     Pupils: Pupils are equal, round, and reactive to light.  Neck:     Thyroid: No thyromegaly.     Vascular: No JVD.     Trachea: No tracheal deviation.  Cardiovascular:     Rate and Rhythm: Normal rate and regular rhythm.     Heart sounds: Normal heart sounds. No murmur heard.   No friction rub. No gallop.  Pulmonary:     Effort: Pulmonary effort is normal. No respiratory distress.     Breath sounds: No wheezing or rales.  Chest:     Chest wall: No tenderness.  Abdominal:     General: Bowel sounds are normal.     Palpations: Abdomen is soft.  Musculoskeletal:        General: Normal range of motion.     Cervical back: Normal range of motion and neck supple.  Lymphadenopathy:     Cervical: No cervical adenopathy.  Skin:    General: Skin is warm and dry.  Neurological:     Mental Status: She is alert and oriented to person, place, and time.     Cranial Nerves: No cranial nerve deficit.  Psychiatric:        Behavior: Behavior normal.        Thought Content: Thought content normal.        Judgment: Judgment normal.       Assessment/Plan: 1. Acute bronchitis with asthma This is resolved patient is feeling better.  2. Sleep disturbance Patient has sign and symptoms of OSA ( disturbed sleep, excessive fatigue during the day, uncontrolled bp and abnormal BMI). Baseline sleep study is ordered  to further look into this. Long term complications of OSA was addressed with the patient. Pt had abnormal overnight pox with hypoxia  - [facility] SLEEP STUDY; Future  3. Uncontrolled hypertension Patient is on short acting calcium channel blockers, will change  to Cardizem CD and titrate as indicated  4. BMI 45.0-49.9, adult (HCC) Elevated BMI, need to follow-up with treatment plan for her weight problems  General Counseling: Alajia verbalizes understanding of the findings of todays visit and agrees with plan of treatment. I have discussed any further diagnostic evaluation that may be needed or ordered today. We also reviewed her medications today. she has been encouraged to call the office with any questions or concerns that should arise related to todays visit.    Orders Placed This Encounter  Procedures   PSG SLEEP STUDY    Meds ordered this encounter  Medications   diltiazem (CARDIZEM CD) 180 MG 24 hr capsule    Sig: Take 1 capsule (180 mg total) by mouth daily.    Dispense:  90 capsule    Refill:  3    Total time spent:30 Minutes Time spent includes review of chart, medications, test results, and follow up plan with the patient.   [place] Controlled Substance Database was reviewed by me.   Dr Lavera Guise Internal medicine

## 2020-09-02 DIAGNOSIS — E039 Hypothyroidism, unspecified: Secondary | ICD-10-CM | POA: Diagnosis not present

## 2020-09-02 DIAGNOSIS — E6609 Other obesity due to excess calories: Secondary | ICD-10-CM | POA: Diagnosis not present

## 2020-09-02 DIAGNOSIS — E042 Nontoxic multinodular goiter: Secondary | ICD-10-CM | POA: Diagnosis not present

## 2020-09-02 DIAGNOSIS — F411 Generalized anxiety disorder: Secondary | ICD-10-CM | POA: Diagnosis not present

## 2020-09-02 DIAGNOSIS — Z7189 Other specified counseling: Secondary | ICD-10-CM | POA: Diagnosis not present

## 2020-09-02 DIAGNOSIS — M153 Secondary multiple arthritis: Secondary | ICD-10-CM | POA: Diagnosis not present

## 2020-09-02 DIAGNOSIS — I10 Essential (primary) hypertension: Secondary | ICD-10-CM | POA: Diagnosis not present

## 2020-09-02 DIAGNOSIS — Z6841 Body Mass Index (BMI) 40.0 and over, adult: Secondary | ICD-10-CM | POA: Diagnosis not present

## 2020-09-03 ENCOUNTER — Telehealth: Payer: Self-pay

## 2020-09-03 NOTE — Telephone Encounter (Signed)
Oxygen therapy order signed by provider and placed in [facility] folder. Shelley [name]

## 2020-09-04 ENCOUNTER — Telehealth: Payer: Self-pay

## 2020-09-04 NOTE — Telephone Encounter (Signed)
Pt called that still coughing clear mucus  and also try OTC is not helping we make her appt in person tomorrow

## 2020-09-05 ENCOUNTER — Other Ambulatory Visit: Payer: Self-pay

## 2020-09-05 ENCOUNTER — Encounter: Payer: Self-pay | Admitting: Internal Medicine

## 2020-09-05 ENCOUNTER — Ambulatory Visit: Payer: BC Managed Care – PPO | Admitting: Internal Medicine

## 2020-09-05 VITALS — BP 126/90 | HR 66 | Temp 97.3°F | Resp 16 | Ht 66.0 in | Wt 289.4 lb

## 2020-09-05 DIAGNOSIS — K219 Gastro-esophageal reflux disease without esophagitis: Secondary | ICD-10-CM | POA: Diagnosis not present

## 2020-09-05 DIAGNOSIS — R059 Cough, unspecified: Secondary | ICD-10-CM

## 2020-09-05 DIAGNOSIS — J3089 Other allergic rhinitis: Secondary | ICD-10-CM

## 2020-09-05 DIAGNOSIS — G479 Sleep disorder, unspecified: Secondary | ICD-10-CM

## 2020-09-05 MED ORDER — MONTELUKAST SODIUM 10 MG PO TABS: 10.0000 mg | ORAL_TABLET | Freq: Every day | ORAL | 3 refills | Status: DC

## 2020-09-05 MED ORDER — AZELASTINE HCL 0.1 % NA SOLN: 2.0000 | Freq: Two times a day (BID) | NASAL | 12 refills | Status: DC

## 2020-09-05 MED ORDER — OMEPRAZOLE 40 MG PO CPDR: DELAYED_RELEASE_CAPSULE | ORAL | 1 refills | Status: DC

## 2020-09-05 NOTE — Progress Notes (Signed)
[facility] [place], [place] [address]  Internal MEDICINE  Office Visit Note  Patient Name: Shelley [name]  [id number]  [id number]  Date of Service: 09/12/2020  Chief Complaint  Patient presents with   Acute Visit    Lingering cough     HPI Patient is here for acute and sick visit. Patient continues to have cough, she had repeated spirometry's and had a normal PFT, but seems to me has more postnasal drip and chronic sinus congestion Her CT scan did not show any hilar lymphadenopathy did have mild cardiomegaly with trace pericardial he effusion and small hiatal hernia Patient complains of cough during the day and at night, does take Prilosec 20 mg, might need an upper GI as well she also did a home sleep study which was insignificant and nonconclusive at the time. Her blood pressure continues to be elevated, has elevated BMI. Does admit to snoring and sleep disturbance, a sleep study has been ordered on a previous visit Current Medication: Outpatient Encounter Medications as of 09/05/2020  Medication Sig   albuterol (VENTOLIN HFA) 108 (90 Base) MCG/ACT inhaler Inhale 2 puffs into the lungs every 6 (six) hours as needed for wheezing or shortness of breath.   ALPRAZolam (XANAX) 0.25 MG tablet Take half tab twice a day as needed for severe panic attacks   azelastine (ASTELIN) 0.1 % nasal spray Place 2 sprays into both nostrils 2 (two) times daily. Use in each nostril as directed   diltiazem (CARDIZEM CD) 180 MG 24 hr capsule Take 1 capsule (180 mg total) by mouth daily.   escitalopram (LEXAPRO) 10 MG tablet Take one tab po qd with supper for panic attacks   meclizine (ANTIVERT) 25 MG tablet Take 25 mg by mouth 3 (three) times daily as needed.   meloxicam (MOBIC) 15 MG tablet Take 1 tablet (15 mg total) by mouth daily as needed.   montelukast (SINGULAIR) 10 MG tablet Take 1 tablet (10 mg total) by mouth at bedtime.   Multiple Vitamin (MULTIVITAMIN) tablet Take  1 tablet by mouth daily.   [DISCONTINUED] omeprazole (PRILOSEC) 20 MG capsule Take 1 capsule (20 mg total) by mouth daily.   omeprazole (PRILOSEC) 40 MG capsule Take one tab po qd for heart burn   [DISCONTINUED] azithromycin (ZITHROMAX) 250 MG tablet Take one tab a day for 10 days for uri (Patient not taking: Reported on 09/05/2020)   No facility-administered encounter medications on file as of 09/05/2020.    Surgical History: Past Surgical History:  Procedure Laterality Date   RIGHT/LEFT HEART CATH AND CORONARY ANGIOGRAPHY Bilateral 05/26/2019   Procedure: RIGHT/LEFT HEART CATH AND CORONARY ANGIOGRAPHY;  Surgeon: Nelva Bush, MD;  Location: [facility];  Service: Cardiovascular;  Laterality: Bilateral;   TUBAL LIGATION      Medical History: Past Medical History:  Diagnosis Date   Anemia    Anxiety    Arthritis    Cardiomegaly    Family history of breast cancer    Family history of ovarian cancer    5/22 cancer genetic testing letter sent   Hypertension    Pulmonary hypertension ([place])    Thyroid disease    Vertigo     Family History: Family History  Problem Relation Age of Onset   Cancer Other    Breast cancer Maternal Grandmother    Breast cancer Cousin        1st maternal cousin   Heart attack Mother    Ovarian cancer Mother  Cancer Sister     Social History   Socioeconomic History   Marital status: Single    Spouse name: Not on file   Number of children: Not on file   Years of education: Not on file   Highest education level: Not on file  Occupational History   Not on file  Tobacco Use   Smoking status: Never   Smokeless tobacco: Never  Vaping Use   Vaping Use: Never used  Substance and Sexual Activity   Alcohol use: No   Drug use: No   Sexual activity: Yes    Birth control/protection: I.U.D.  Other Topics Concern   Not on file  Social History Narrative   Not on file   Social Determinants of Health   Financial Resource Strain: Not  on file  Food Insecurity: Not on file  Transportation Needs: Not on file  Physical Activity: Not on file  Stress: Not on file  Social Connections: Not on file  Intimate Partner Violence: Not on file      Review of Systems  Constitutional:  Negative for fatigue and fever.  HENT:  Positive for postnasal drip. Negative for congestion and mouth sores.   Respiratory:  Positive for cough. Negative for shortness of breath.   Cardiovascular:  Negative for chest pain.  Genitourinary:  Negative for flank pain.  Psychiatric/Behavioral:  Positive for sleep disturbance.    Vital Signs: BP 126/90   Pulse 66   Temp (!) 97.3 F (36.3 C)   Resp 16   Ht 5\' 6"  (1.676 m)   Wt 289 lb 6.4 oz (131.3 kg)   SpO2 99%   BMI 46.71 kg/m    Physical Exam HENT:     Head: Normocephalic and atraumatic.     Nose: Congestion and rhinorrhea present.     Comments: Boggy and swollen turbinates  Eyes:     Extraocular Movements: Extraocular movements intact.     Pupils: Pupils are equal, round, and reactive to light.  Cardiovascular:     Rate and Rhythm: Normal rate and regular rhythm.  Pulmonary:     Effort: Pulmonary effort is normal.     Breath sounds: Normal breath sounds.  Musculoskeletal:        General: Normal range of motion.  Neurological:     General: No focal deficit present.     Mental Status: She is alert and oriented to person, place, and time.  Psychiatric:        Mood and Affect: Mood normal.        Behavior: Behavior normal.       Assessment/Plan: 1. Cough Seems to be multifactorial. 1.  CT chest did not show any hilar lymphadenopathy sarcoidosis has been ruled out. 2.  Repeated spirometry and PFTs have been normal. 3.  Her ACE inhibitor has been stopped and replaced by Cardizem CD for blood pressure control. 4.  She does have a small hiatal hernia might need an upper GI. 5.  Might need CT of the sinuses, and allergy testing if cough continues to be a problem. 6.  Patient  did have a small pericardial effusion might need repeat echo her connective tissue disease work-up has been negative We will start with escalating therapy continue with intranasal corticosteroids add Singulair, add azelastine continue to take Claritin over-the-counter, might need to see pulmonary - Spirometry with Graph  2. Non-seasonal allergic rhinitis due to other allergic trigger Patient did have swollen boggy turbinates on examination, might need CT sinuses if symptoms  are not resolved might need allergy testing if symptoms are not resolved - montelukast (SINGULAIR) 10 MG tablet; Take 1 tablet (10 mg total) by mouth at bedtime.  Dispense: 90 tablet; Refill: 3 - azelastine (ASTELIN) 0.1 % nasal spray; Place 2 sprays into both nostrils 2 (two) times daily. Use in each nostril as directed  Dispense: 30 mL; Refill: 12  3. Gastroesophageal reflux disease without esophagitis We will increase her Prilosec 40 mg once a day, might need an upper GI to assess the degree of hiatal hernia - omeprazole (PRILOSEC) 40 MG capsule; Take one tab po qd for heart burn  Dispense: 90 capsule; Refill: 1    4. Sleep disturbance Patient is already scheduled for sleep studies, she had significant hypoxemia her overnight pulse oximetry  General Counseling: Shelley [name] verbalizes understanding of the findings of todays visit and agrees with plan of treatment. I have discussed any further diagnostic evaluation that may be needed or ordered today. We also reviewed her medications today. she has been encouraged to call the office with any questions or concerns that should arise related to todays visit. Patient understands and agrees with plan  Orders Placed This Encounter  Procedures   Spirometry with Graph    Meds ordered this encounter  Medications   omeprazole (PRILOSEC) 40 MG capsule    Sig: Take one tab po qd for heart burn    Dispense:  90 capsule    Refill:  1   montelukast (SINGULAIR) 10 MG tablet    Sig:  Take 1 tablet (10 mg total) by mouth at bedtime.    Dispense:  90 tablet    Refill:  3   azelastine (ASTELIN) 0.1 % nasal spray    Sig: Place 2 sprays into both nostrils 2 (two) times daily. Use in each nostril as directed    Dispense:  30 mL    Refill:  12    Total time spent:35 Minutes Time spent includes review of chart, medications, test results, and follow up plan with the patient.   [place] Controlled Substance Database was reviewed by me.   Dr Lavera Guise Internal medicine

## 2020-09-11 ENCOUNTER — Encounter (INDEPENDENT_AMBULATORY_CARE_PROVIDER_SITE_OTHER): Payer: BC Managed Care – PPO | Admitting: Internal Medicine

## 2020-09-11 DIAGNOSIS — G4733 Obstructive sleep apnea (adult) (pediatric): Secondary | ICD-10-CM | POA: Diagnosis not present

## 2020-09-11 DIAGNOSIS — G4719 Other hypersomnia: Secondary | ICD-10-CM

## 2020-09-13 DIAGNOSIS — R5383 Other fatigue: Secondary | ICD-10-CM | POA: Diagnosis not present

## 2020-09-13 DIAGNOSIS — D510 Vitamin B12 deficiency anemia due to intrinsic factor deficiency: Secondary | ICD-10-CM | POA: Diagnosis not present

## 2020-09-14 LAB — IRON AND TIBC
Iron Saturation: 25 % (ref 15–55)
Iron: 54 ug/dL (ref 27–159)
Total Iron Binding Capacity: 219 ug/dL — ABNORMAL LOW (ref 250–450)
UIBC: 165 ug/dL (ref 131–425)

## 2020-09-14 LAB — FERRITIN: Ferritin: 288 ng/mL — ABNORMAL HIGH (ref 15–150)

## 2020-09-14 LAB — FOLATE: Folate: 10.8 ng/mL (ref >3.0)

## 2020-09-14 LAB — VITAMIN B12: Vitamin B-12: 297 pg/mL (ref 232–1245)

## 2020-09-16 DIAGNOSIS — G4733 Obstructive sleep apnea (adult) (pediatric): Secondary | ICD-10-CM | POA: Insufficient documentation

## 2020-09-16 DIAGNOSIS — G4719 Other hypersomnia: Secondary | ICD-10-CM | POA: Insufficient documentation

## 2020-09-16 NOTE — Procedures (Signed)
[facility] Report Part I                                                                 Phone: [phone] Fax: [phone]  Patient Name: Shelley [name], Shelley [name] Acquisition Number: [id number]  Date of Birth: May 30, 1960 Acquisition Date: 09/11/2020  Referring Physician: Clayborn Bigness, MD     History: The patient is a 60 year old female  who was referred for evaluation of possible sleep apnea with snoring and sleepiness. Medical History: hypertension, anemia, thyroid disease, cardiomegaly, pulmonary hypertension, anxiety, vertigo.  Medications: albuterol, alprazolam, azithromycin, diltiazem, escitalopram, meclizine, meloxicam, multivitamin, omeprazole.  Procedure: This routine overnight polysomnogram was performed on the [facility] 5 using the standard diagnostic protocol. This included 6 channels of EEG, 2 channels of EOG, chin EMG, bilateral anterior tibialis EMG, nasal/oral thermistor, PTAF (nasal pressure transducer), chest and abdominal wall movements, EKG, and pulse oximetry.  Description: The total recording time was 399.0 minutes. The total sleep time was 352.5 minutes. There were a total of 34.0 minutes of wakefulness after sleep onset for a slightly reducedsleep efficiency of 88.3%. The latency to sleep onset was within normal limitsat 12.5 minutes. The R sleep onset latency was within normal limits at 91.0 minutes. Sleep parameters, as a percentage of the total sleep time, demonstrated 11.8% of sleep was in N1 sleep, 87.9% N2, 0.0% N3 and 0.3% R sleep. There were a total of 60 arousals for an arousal index of 10.2 arousals per hour of sleep that was normal.  Respiratory monitoring demonstrated continuous severe snoring in any position. There were 94 apneas and hypopneas for an Apnea Hypopnea Index of 16.0 apneas and hypopneas per hour of sleep. The REM related apnea hypopnea index was 60.0/hr of REM sleep compared to a NREM AHI of 15.7/hr.  The average duration of the  respiratory events was 20.1 seconds with a maximum duration of 50.5 seconds. The respiratory events occurred in all positions, however they were more frequent in the supine position with an AHI of 24.0. The respiratory events were associated with peripheral oxygen desaturations on the average to 90%. The lowest oxygen desaturation associated with a respiratory event was 86%. Additionally, the baseline oxygen saturation during wakefulness was 97%, during NREM sleep averaged 94%, and during REM sleep averaged  95%. The total duration of oxygen < 90% was 3.6 minutes.  Cardiac monitoring- did not demonstrate transient cardiac decelerations associated with the apneas. There were no significant cardiac rhythm irregularities.   Periodic limb movement monitoring- demonstrated that there were 627 periodic limb movements for a periodic limb movement index of 106.7 periodic limb movements per hour of sleep. Frequent, quasi-periodic limb movements were observed during periods of wakefulness.  Impression: This routine overnight polysomnogram demonstrated significant obstructive sleep apnea with an overall Apnea Hypopnea Index of 16.0 apneas and hypopneas per hour of sleep. Virtually no REM sleep was observed which may cause an underestimation of the severity of the sleep apnea. Supplemental oxygen was not required to maintain the baseline oxygen saturation above 90%.  There was a highly elevated periodic limb movement index of 106.9 periodic limb movements per hour of sleep. In addition, frequent, quasi-periodic limb movements were observed during periods of wakefulness. Sometimes these limb movements subside  once the apnea is controlled. Clinical correlation is suggested.  There was a slightly reduced sleep efficiency with no slow wave and virtually no REM sleep. These findings would appear to be due to the combination of obstructive sleep apnea and periodic limb movements.   Recommendations:    A CPAP titration  would be recommended due to the severity of the sleep apnea. Some supine sleep should be ensured to optimize the titration. Additionally, would recommend weight loss in a patient with a BMI of 45.5.     Allyne Gee, MD, [facility] Diplomate ABMS-Pulmonary, Critical Care and Sleep Medicine  Electronically reviewed and digitally signed     [facility] Report Part II  Phone: [phone] Fax: [phone]  Patient last name [name] Neck Size 17.0 in. Acquisition [id number]  Patient first name Danniell Weight 282.0 lbs. Started 09/11/2020 at 10:31:07 PM  Birth date 01/14/1961 Height 66.0 in. Stopped 09/12/2020 at 5:19:01 AM  Age 50 BMI 45.5 lb/in2 Duration 399.0  Study Type Adult      Jaclynn Major, RPSGT Sleep Data: Lights Out: 10:36:37 PM Sleep Onset: 10:49:07 PM  Lights On: 5:15:37 AM Sleep Efficiency: 88.3 %  Total Recording Time: 399.0 min Sleep Latency (from Lights Off) 12.5 min  Total Sleep Time (TST): 352.5 min R Latency (from Sleep Onset): 91.0 min  Sleep Period Time: 386.5 min Total number of awakenings: 15  Wake during sleep: 34.0 min Wake After Sleep Onset (WASO): 34.0 min   Sleep Data:         Arousal Summary: Stage  Latency from lights out (min) Latency from sleep onset (min) Duration (min) % Total Sleep Time  Normal values  N 1 12.5 0.0 41.5 11.8 (5%)  N 2 32.0 19.5 310.0 87.9 (50%)  N 3       0.0 0.0 (20%)  R 103.5 91.0 1.0 0.3 (25%)    Number Index  Spontaneous 31 5.3  Apneas & Hypopneas 12 2.0  RERAs 0 0.0       (Apneas & Hypopneas & RERAs)  (12) (2.0)  Limb Movement 17 2.9  Snore 0 0.0  TOTAL 60 10.2     Respiratory Data:  CA OA MA Apnea Hypopnea* A+ H RERA Total  Number 0 3 0 3 91 94 0 94  Mean Dur (sec) 0.0 12.8 0.0 12.8 20.4 20.1 0.0 20.1  Max Dur (sec) 0.0 14.5 0.0 14.5 50.5 50.5 0.0 50.5  Total Dur (min) 0.0 0.6 0.0 0.6 30.9 31.5 0.0 31.5  % of TST 0.0 0.2 0.0 0.2 8.8 8.9 0.0 8.9  Index (#/h TST) 0.0 0.5 0.0 0.5 15.5 16.0  0.0 16.0  *Hypopneas scored based on 4% or greater desaturation.  Sleep Stage:        REM NREM TST  AHI 60.0 15.7 16.0  RDI 60.0 15.7 16.0           Body Position Data:  Sleep (min) TST (%) REM (min) NREM (min) CA (#) OA (#) MA (#) HYP (#) AHI (#/h) RERA (#) RDI (#/h) Desat (#)  Supine 149.9 42.52 1.0 148.9 0 3 0 57 24.0 0 24.0 88  Non-Supine 202.60 57.48 0.00 202.60 0.00 0.00 0.00 34.00 10.07 0 10.07 74.00  Left: 35.0 9.93 0.0 35.0 0 0 0 11 18.9 0 18.9 22  Right: 167.6 47.55 0.0 167.6 0 0 0 23 8.2 0 8.2 52     Snoring: Total number of snoring episodes  0  Total time with snoring  min (   % of sleep)   Oximetry Distribution:             WK REM NREM TOTAL  Average (%)   97 95 94 94  < 90% 0.4 0.0 3.2 3.6  < 80% 0.3 0.0 0.0 0.3  < 70% 0.2 0.0 0.0 0.2  # of Desaturations* 6 1 155 162  Desat Index (#/hour) 8.4 60.0 26.5 27.6  Desat Max (%) 8 5 13 13   Desat Max Dur (sec) 24.0 11.0 52.0 52.0  Approx Min O2 during sleep 83  Approx min O2 during a respiratory event 86  Was Oxygen added (Y/N) and final rate No:   0 LPM  *Desaturations based on 4% or greater drop from baseline.   Cheyne Stokes Breathing: None Present     Heart Rate Summary:  Average Heart Rate During Sleep 69.8 bpm      Highest Heart Rate During Sleep (95th %) 73.0 bpm      Highest Heart Rate During Sleep 119 bpm      Highest Heart Rate During Recording (TIB) 188 bpm (artifact)   Heart Rate Observations: Event Type # Events   Bradycardia 0 Lowest HR Scored: N/A  Sinus Tachycardia During Sleep 0 Highest HR Scored: N/A  Narrow Complex Tachycardia 0 Highest HR Scored: N/A  Wide Complex Tachycardia 0 Highest HR Scored: N/A  Asystole 0 Longest Pause: N/A  Atrial Fibrillation 0 Duration Longest Event: N/A  Other Arrythmias  No Type:    Periodic Limb Movement Data: (Primary legs unless otherwise noted) Total # Limb Movement 631 Limb Movement Index 107.4  Total # PLMS 627 PLMS Index 106.7   Total # PLMS Arousals 17 PLMS Arousal Index 2.9  Percentage Sleep Time with PLMS 234.19min (66.5 % sleep)  Mean Duration limb movements (secs) 879.0

## 2020-09-23 ENCOUNTER — Encounter: Payer: Self-pay | Admitting: Physician Assistant

## 2020-09-23 ENCOUNTER — Ambulatory Visit: Payer: BC Managed Care – PPO | Admitting: Physician Assistant

## 2020-09-23 ENCOUNTER — Other Ambulatory Visit: Payer: Self-pay

## 2020-09-23 ENCOUNTER — Telehealth: Payer: Self-pay

## 2020-09-23 DIAGNOSIS — R059 Cough, unspecified: Secondary | ICD-10-CM

## 2020-09-23 DIAGNOSIS — I10 Essential (primary) hypertension: Secondary | ICD-10-CM | POA: Diagnosis not present

## 2020-09-23 DIAGNOSIS — Z6841 Body Mass Index (BMI) 40.0 and over, adult: Secondary | ICD-10-CM

## 2020-09-23 DIAGNOSIS — E538 Deficiency of other specified B group vitamins: Secondary | ICD-10-CM

## 2020-09-23 DIAGNOSIS — F411 Generalized anxiety disorder: Secondary | ICD-10-CM

## 2020-09-23 DIAGNOSIS — G4733 Obstructive sleep apnea (adult) (pediatric): Secondary | ICD-10-CM

## 2020-09-23 DIAGNOSIS — K219 Gastro-esophageal reflux disease without esophagitis: Secondary | ICD-10-CM

## 2020-09-23 MED ORDER — CYANOCOBALAMIN 1000 MCG/ML IJ SOLN
1000.0000 ug | Freq: Once | INTRAMUSCULAR | Status: AC
  Administered 2020-09-23: 1000 ug via INTRAMUSCULAR

## 2020-09-23 NOTE — Progress Notes (Signed)
[facility] [address] [place], [place] [address]  Internal MEDICINE  Office Visit Note  Patient Name: Shelley [name]  [id number]  [id number]  Date of Service: 09/29/2020  Chief Complaint  Patient presents with   Follow-up    Sleep study results    Anemia   Anxiety   Hypertension   Sleep Apnea   Quality Metric Gaps    mammogram    HPI Pt is here for routine follow up --PSG found that she does have OSA--Titration study scheduled on wednesday --Congestion is better, still has a cough sometimes. Discussed ordering upper GI for further eval --Lexapro is working well, has not used xanax recently. --Her B12 is low and will start injections today --Will continue to work on wt loss  Current Medication: Outpatient Encounter Medications as of 09/23/2020  Medication Sig   albuterol (VENTOLIN HFA) 108 (90 Base) MCG/ACT inhaler Inhale 2 puffs into the lungs every 6 (six) hours as needed for wheezing or shortness of breath.   ALPRAZolam (XANAX) 0.25 MG tablet Take half tab twice a day as needed for severe panic attacks   azelastine (ASTELIN) 0.1 % nasal spray Place 2 sprays into both nostrils 2 (two) times daily. Use in each nostril as directed   diltiazem (CARDIZEM CD) 180 MG 24 hr capsule Take 1 capsule (180 mg total) by mouth daily.   escitalopram (LEXAPRO) 10 MG tablet Take one tab po qd with supper for panic attacks   meclizine (ANTIVERT) 25 MG tablet Take 25 mg by mouth 3 (three) times daily as needed.   meloxicam (MOBIC) 15 MG tablet Take 1 tablet (15 mg total) by mouth daily as needed.   montelukast (SINGULAIR) 10 MG tablet Take 1 tablet (10 mg total) by mouth at bedtime.   Multiple Vitamin (MULTIVITAMIN) tablet Take 1 tablet by mouth daily.   omeprazole (PRILOSEC) 40 MG capsule Take one tab po qd for heart burn   [EXPIRED] cyanocobalamin ((VITAMIN B-12)) injection 1,000 mcg    No facility-administered encounter medications on file as of 09/23/2020.     Surgical History: Past Surgical History:  Procedure Laterality Date   RIGHT/LEFT HEART CATH AND CORONARY ANGIOGRAPHY Bilateral 05/26/2019   Procedure: RIGHT/LEFT HEART CATH AND CORONARY ANGIOGRAPHY;  Surgeon: Nelva Bush, MD;  Location: [facility];  Service: Cardiovascular;  Laterality: Bilateral;   TUBAL LIGATION      Medical History: Past Medical History:  Diagnosis Date   Anemia    Anxiety    Arthritis    Cardiomegaly    Family history of breast cancer    Family history of ovarian cancer    5/22 cancer genetic testing letter sent   Hypertension    Pulmonary hypertension ([place])    Sleep apnea    Thyroid disease    Vertigo     Family History: Family History  Problem Relation Age of Onset   Cancer Other    Breast cancer Maternal Grandmother    Breast cancer Cousin        1st maternal cousin   Heart attack Mother    Ovarian cancer Mother    Cancer Sister     Social History   Socioeconomic History   Marital status: Single    Spouse name: Not on file   Number of children: Not on file   Years of education: Not on file   Highest education level: Not on file  Occupational History   Not on file  Tobacco Use   Smoking  status: Never   Smokeless tobacco: Never  Vaping Use   Vaping Use: Never used  Substance and Sexual Activity   Alcohol use: No   Drug use: No   Sexual activity: Yes    Birth control/protection: I.U.D.  Other Topics Concern   Not on file  Social History Narrative   Not on file   Social Determinants of Health   Financial Resource Strain: Not on file  Food Insecurity: Not on file  Transportation Needs: Not on file  Physical Activity: Not on file  Stress: Not on file  Social Connections: Not on file  Intimate Partner Violence: Not on file      Review of Systems  Constitutional:  Positive for fatigue. Negative for chills and unexpected weight change.  HENT:  Positive for postnasal drip. Negative for congestion,  rhinorrhea, sneezing and sore throat.   Eyes:  Negative for redness.  Respiratory:  Positive for cough. Negative for chest tightness and shortness of breath.   Cardiovascular:  Negative for chest pain and palpitations.  Gastrointestinal:  Negative for abdominal pain, constipation, diarrhea, nausea and vomiting.  Genitourinary:  Negative for dysuria and frequency.  Musculoskeletal:  Negative for arthralgias, back pain, joint swelling and neck pain.  Skin:  Negative for rash.  Neurological: Negative.  Negative for tremors and numbness.  Hematological:  Negative for adenopathy. Does not bruise/bleed easily.  Psychiatric/Behavioral:  Positive for sleep disturbance. Negative for behavioral problems (Depression) and suicidal ideas. The patient is not nervous/anxious.    Vital Signs: BP 134/84 Comment: 150/82  Pulse 75   Temp 97.6 F (36.4 C)   Resp 16   Ht 5\' 6"  (1.676 m)   Wt 293 lb 9.6 oz (133.2 kg)   SpO2 98%   BMI 47.39 kg/m    Physical Exam Vitals and nursing note reviewed.  Constitutional:      General: She is not in acute distress.    Appearance: She is well-developed. She is obese. She is not diaphoretic.  HENT:     Head: Normocephalic and atraumatic.     Mouth/Throat:     Pharynx: No oropharyngeal exudate.  Eyes:     Pupils: Pupils are equal, round, and reactive to light.  Neck:     Thyroid: No thyromegaly.     Vascular: No JVD.     Trachea: No tracheal deviation.  Cardiovascular:     Rate and Rhythm: Normal rate and regular rhythm.     Heart sounds: Normal heart sounds. No murmur heard.   No friction rub. No gallop.  Pulmonary:     Effort: Pulmonary effort is normal. No respiratory distress.     Breath sounds: No wheezing or rales.  Chest:     Chest wall: No tenderness.  Abdominal:     General: Bowel sounds are normal.     Palpations: Abdomen is soft.  Musculoskeletal:        General: Normal range of motion.     Cervical back: Normal range of motion and neck  supple.  Lymphadenopathy:     Cervical: No cervical adenopathy.  Skin:    General: Skin is warm and dry.  Neurological:     Mental Status: She is alert and oriented to person, place, and time.     Cranial Nerves: No cranial nerve deficit.  Psychiatric:        Behavior: Behavior normal.        Thought Content: Thought content normal.        Judgment: Judgment  normal.       Assessment/Plan: 1. OSA (obstructive sleep apnea) Titration scheduled  2. Essential hypertension Stable, continue current therapy  3. GAD (generalized anxiety disorder) Stable, continue lexapro  4. Cough Improving, but still present--will order upper GI for further eval  5. Gastroesophageal reflux disease without esophagitis Continue PPI, will order upper GI for further eval  6. B12 deficiency - cyanocobalamin ((VITAMIN B-12)) injection 1,000 mcg  7. Morbid obesity with BMI of 45.0-49.9, adult ([place]) Obesity Counseling: Had a lengthy discussion regarding patients BMI and weight issues. Patient was instructed on portion control as well as increased activity. Also discussed caloric restrictions with trying to maintain intake less than 2000 Kcal. Discussions were made in accordance with the 5As of weight management. Simple actions such as not eating late and if able to, taking a walk is suggested.    General Counseling: Sabrinna verbalizes understanding of the findings of todays visit and agrees with plan of treatment. I have discussed any further diagnostic evaluation that may be needed or ordered today. We also reviewed her medications today. she has been encouraged to call the office with any questions or concerns that should arise related to todays visit.    No orders of the defined types were placed in this encounter.   Meds ordered this encounter  Medications   cyanocobalamin ((VITAMIN B-12)) injection 1,000 mcg    This patient was seen by Drema Dallas, PA-C in collaboration with Dr. Clayborn Bigness as a part of collaborative care agreement.   Total time spent:35 Minutes Time spent includes review of chart, medications, test results, and follow up plan with the patient.      Dr Lavera Guise Internal medicine

## 2020-09-23 NOTE — Telephone Encounter (Signed)
Upper GI scheduled for 09-27-20 at [facility] arr 9:15 patient advised NPO after midnight. Loma Sousa

## 2020-09-25 ENCOUNTER — Encounter (INDEPENDENT_AMBULATORY_CARE_PROVIDER_SITE_OTHER): Payer: BC Managed Care – PPO | Admitting: Internal Medicine

## 2020-09-25 DIAGNOSIS — G4733 Obstructive sleep apnea (adult) (pediatric): Secondary | ICD-10-CM

## 2020-09-27 ENCOUNTER — Other Ambulatory Visit: Payer: Self-pay | Admitting: Physician Assistant

## 2020-09-27 ENCOUNTER — Ambulatory Visit
Admission: RE | Admit: 2020-09-27 | Discharge: 2020-09-27 | Disposition: A | Discharge status: Home / Self Care | Payer: BC Managed Care – PPO | Source: Ambulatory Visit | Attending: Physician Assistant | Admitting: Physician Assistant

## 2020-09-27 ENCOUNTER — Other Ambulatory Visit: Payer: Self-pay

## 2020-09-27 DIAGNOSIS — K219 Gastro-esophageal reflux disease without esophagitis: Secondary | ICD-10-CM

## 2020-09-27 DIAGNOSIS — R059 Cough, unspecified: Secondary | ICD-10-CM | POA: Insufficient documentation

## 2020-09-27 DIAGNOSIS — K224 Dyskinesia of esophagus: Secondary | ICD-10-CM | POA: Diagnosis not present

## 2020-09-29 NOTE — Patient Instructions (Signed)
Living With Sleep Apnea Sleep apnea is a condition in which breathing pauses or becomes shallow during sleep. Sleep apnea is most commonly caused by a collapsed or blocked airway. People with sleep apnea usually snore loudly. They may have times when they gasp and stop breathing for 10 seconds or more during sleep. This may happenmany times during the night. The breaks in breathing also interrupt the deep sleep that you need to feel rested. Even if you do not completely wake up from the gaps in breathing, your sleep may not be restful and you feel tired during the day. You may also have a headache in the morning and low energy during the day, and you may feel anxiousor depressed. How can sleep apnea affect me? Sleep apnea increases your chances of extreme tiredness during the day (daytime fatigue). It can also increase your risk for health conditions, such as: Heart attack. Stroke. Obesity. Type 2 diabetes. Heart failure. Irregular heartbeat. High blood pressure. If you have daytime fatigue as a result of sleep apnea, you may be more likely to: Perform poorly at school or work. Fall asleep while driving. Have difficulty with attention. Develop depression or anxiety. Have sexual dysfunction. What actions can I take to manage sleep apnea? Sleep apnea treatment  If you were given a device to open your airway while you sleep, use it only as told by your health care provider. You may be given: An oral appliance. This is a custom-made mouthpiece that shifts your lower jaw forward. A continuous positive airway pressure (CPAP) device. This device blows air through a mask when you breathe out (exhale). A nasal expiratory positive airway pressure (EPAP) device. This device has valves that you put into each nostril. A bi-level positive airway pressure (BPAP) device. This device blows air through a mask when you breathe in (inhale) and breathe out (exhale). You may need surgery if other treatments do  not work for you.  Sleep habits Go to sleep and wake up at the same time every day. This helps set your internal clock (circadian rhythm) for sleeping. If you stay up later than usual, such as on weekends, try to get up in the morning within 2 hours of your normal wake time. Try to get at least 7-9 hours of sleep each night. Stop using a computer, tablet, and mobile phone a few hours before bedtime. Do not take long naps during the day. If you nap, limit it to 30 minutes. Have a relaxing bedtime routine. Reading or listening to music may relax you and help you sleep. Use your bedroom only for sleep. Keep your television and computer out of your bedroom. Keep your bedroom cool, dark, and quiet. Use a supportive mattress and pillows. Follow your health care provider's instructions for other changes to sleep habits. Nutrition Do not eat heavy meals in the evening. Do not have caffeine in the later part of the day. The effects of caffeine can last for more than 5 hours. Follow your health care provider's or dietitian's instructions for any diet changes. Lifestyle     Do not drink alcohol before bedtime. Alcohol can cause you to fall asleep at first, but then it can cause you to wake up in the middle of the night and have trouble getting back to sleep. Do not use any products that contain nicotine or tobacco. These products include cigarettes, chewing tobacco, and vaping devices, such as e-cigarettes. If you need help quitting, ask your health care provider. Medicines Take over-the-counter   and prescription medicines only as told by your health care provider. Do not use over-the-counter sleep medicine. You can become dependent on this medicine, and it can make sleep apnea worse. Do not use medicines, such as sedatives and narcotics, unless told by your health care provider. Activity Exercise on most days, but avoid exercising in the evening. Exercising near bedtime can interfere with  sleeping. If possible, spend time outside every day. Natural light helps regulate your circadian rhythm. General information Lose weight if you need to, and maintain a healthy weight. Keep all follow-up visits. This is important. If you are having surgery, make sure to tell your health care provider that you have sleep apnea. You may need to bring your device with you. Where to find more information Learn more about sleep apnea and daytime fatigue from: American Sleep Association: sleepassociation.org National Sleep Foundation: sleepfoundation.org National Heart, Lung, and Blood Institute: nhlbi.nih.gov Summary Sleep apnea is a condition in which breathing pauses or becomes shallow during sleep. Sleep apnea can cause daytime fatigue and other serious health conditions. You may need to wear a device while sleeping to help keep your airway open. If you are having surgery, make sure to tell your health care provider that you have sleep apnea. You may need to bring your device with you. Making changes to sleep habits, diet, lifestyle, and activity can help you manage sleep apnea. This information is not intended to replace advice given to you by your health care provider. Make sure you discuss any questions you have with your healthcare provider. Document Revised: 02/02/2020 Document Reviewed: 02/02/2020 Elsevier Patient Education  2022 Elsevier Inc.  

## 2020-09-30 ENCOUNTER — Ambulatory Visit: Payer: BC Managed Care – PPO

## 2020-10-01 ENCOUNTER — Other Ambulatory Visit: Payer: Self-pay

## 2020-10-01 ENCOUNTER — Ambulatory Visit (INDEPENDENT_AMBULATORY_CARE_PROVIDER_SITE_OTHER): Payer: BC Managed Care – PPO

## 2020-10-01 DIAGNOSIS — E538 Deficiency of other specified B group vitamins: Secondary | ICD-10-CM | POA: Diagnosis not present

## 2020-10-01 MED ORDER — CYANOCOBALAMIN 1000 MCG/ML IJ SOLN
1000.0000 ug | Freq: Once | INTRAMUSCULAR | Status: AC
  Administered 2020-10-01: 1000 ug via INTRAMUSCULAR

## 2020-10-03 ENCOUNTER — Other Ambulatory Visit: Payer: Self-pay | Admitting: Physician Assistant

## 2020-10-03 DIAGNOSIS — G4733 Obstructive sleep apnea (adult) (pediatric): Secondary | ICD-10-CM

## 2020-10-03 NOTE — Procedures (Signed)
[facility] Report Part I  Phone: [phone] Fax: [phone]  Patient Name: Shelley [name], Shelley [name] Acquisition Number: [id number]  Date of Birth: 1961/01/31 Acquisition Date: 09/25/2020  Referring Physician: Clayborn Bigness, MD     History: The patient is a 60 year old female with obstructive sleep apnea for CPAP titration. Medical History: hypertension, anemia, thyroid disease, cardiomegaly, pulmonary hypertension, anxiety, vertigo.  Medications: albuterol, alprazolam, azithromycin, diltiazem, escutalopram, meclizine, melooxicam, multi-vitamin, omeprazole.  Procedure: This routine overnight polysomnogram was performed on the [place] 4 or 5 using the standard CPAP/BIPAP protocol. This included 6 channels of EEG, 2 channels of EOG, chin EMG, bilateral anterior tibialis EMG, nasal/oral thermister, PTAF (nasal pressure transducer), chest and abdominal wall movements, EKG, and pulse oximetry.  Description: The total recording time was 436.1 minutes. The total sleep time was 330.5 minutes. There were a total of 56.0 minutes of wakefulness after sleep onset for a reducedsleep efficiency of 75.8%. The latency to sleep onset was prolonged at 49.6 minutes. The R sleep onset latency was prolonged at 220.5 minutes. Sleep parameters, as a percentage of the total sleep time, demonstrated 1.8% of sleep was in N1 sleep, 65.7% N2, 16.6% N3 and 15.9% R sleep. There were a total of 3 arousals for an arousal index of 0.5 arousals per hour of sleep that was normal.  Overall, there were a total of 5 respiratory events for a respiratory disturbance index, which includes apneas, hypopneas and RERAs (increased respiratory effort) of 0.9 respiratory events per hour of sleep during the pressure titration. CPAP was initiated at  cm of H2O at lights out, 10:19:26 PM. It was titrated in 1-2 cm increments to the final pressure of  cm of H2O.   Additionally, the baseline oxygen saturation during  wakefulness was 99%, during NREM sleep averaged 97%, and during REM sleep averaged 95%. The total duration of oxygen < 90% was 0.3 minutes.  Cardiac monitoring- did not demonstrate transient cardiac decelerations associated with the apneas. There were no significant cardiac rhythm irregularities.   Periodic limb movement monitoring- demonstrated that there were 577 periodic limb movements for a periodic limb movement index of 104.8 periodic limb movements per hour of sleep.   Impression: This patient's obstructive sleep apnea demonstrated significant improvement with the utilization of nasal CPAP.   There was a highly elevated periodic limb movement index of 104.8 periodic limb movements per hour of sleep.   Recommendations: Would recommend utilization of nasal CPAP at 7 cm of H2O.          Allyne Gee, MD [facility] Diplomate ABMS Pulmonary Critical Care Medicine Sleep Medicine Electronically reviewed and digitally signed      [facility] CPAP/BIPAP Polysomnogram Report Part II Phone: [phone] Fax: [phone]  Patient last name [name] Neck Size 16.5 in. Acquisition [id number]  Patient first name Shelley Weight 282.0 lbs. Started 09/25/2020 at 10:14:02 PM  Birth date 04/24/1960 Height 66.0 in. Stopped 09/26/2020 at 5:35:56 AM  Age 79      Type Adult BMI 45.5 lb/in2 Duration 436.1  Report generated by: Adriana Mccallum, RPSGT Sleep Data: Lights Out: 10:19:26 PM Sleep Onset: 11:09:02 PM  Lights On: 5:35:32 AM Sleep Efficiency: 75.8 %  Total Recording Time: 436.1 min Sleep Latency (from Lights Off) 49.6 min  Total Sleep Time (TST): 330.5 min R Latency (from Sleep Onset): 220.5 min  Sleep Period Time: 386.5 min Total number of awakenings: 9  Wake during sleep: 56.0 min  Wake After Sleep Onset (WASO): 56.0 min   Sleep Data:         Arousal Summary: Stage  Latency from lights out (min) Latency from sleep onset (min) Duration (min) % Total Sleep Time  Normal values  N 1  49.6 0.0 6.0 1.8 (5%)  N 2 50.6 1.0 217.0 65.7 (50%)  N 3 72.6 23.0 55.0 16.6 (20%)  R 270.1 220.5 52.5 15.9 (25%)    Number Index  Spontaneous 14 2.5  Apneas & Hypopneas 0 0.0  RERAs 0 0.0       (Apneas & Hypopneas & RERAs)  (0) (0.0)  Limb Movement 4 0.7  Snore 0 0.0  TOTAL 18 3.3     Respiratory Data:  CA OA MA Apnea Hypopnea* A+ H RERA Total  Number 0 0 0 0 5 5 0 5  Mean Dur (sec) 0.0 0.0 0.0 0.0 20.7 20.7 0.0 20.7  Max Dur (sec) 0.0 0.0 0.0 0.0 23.0 23.0 0.0 23.0  Total Dur (min) 0.0 0.0 0.0 0.0 1.7 1.7 0.0 1.7  % of TST 0.0 0.0 0.0 0.0 0.5 0.5 0.0 0.5  Index (#/h TST) 0.0 0.0 0.0 0.0 0.9 0.9 0.0 0.9  *Hypopneas scored based on 4% or greater desaturation.  Sleep Stage:         Body Position Data:    REM NREM TST  AHI 3.4 0.4 0.9  RDI 3.4 0.4 0.9    Sleep (min) TST (%) REM (min) NREM (min) CA (#) OA (#) MA (#) HYP (#) AHI (#/h) RERA (#) RDI (#/h) Desat (#)  Supine 299.5 90.62 52.5 247.0 0 0 0 5 1.0 0 1.0 9  Non-Supine 31.00 9.38 0.00 31.00 0.00 0.00 0.00 0.00 0.00 0 0.00 0.00  Left: 0.0 0.00 0.0 0.0 0 0 0 0 0.0 0 0.00 0  Right: 31.0 9.38 0.0 31.0 0 0 0 0 0.0 0 0.00 0       Snoring: Total number of snoring episodes  0  Total time with snoring    min (   % of sleep)   Oximetry Distribution:             WK REM NREM TOTAL  Average (%)   99 95 97 97  < 90% 0.2 0.1 0.0 0.3  < 80% 0.1 0.0 0.0 0.1  < 70% 0.1 0.0 0.0 0.1  # of Desaturations* '1 4 4 9  '$ Desat Index (#/hour) 0.6 4.6 0.9 1.6  Desat Max (%) '7 7 4 7  '$ Desat Max Dur (sec) 16.0 37.0 14.0 37.0  Approx Min O2 during sleep 83  Approx min O2 during a respiratory event 83  Was Oxygen added (Y/N) and final rate No:   0 LPM  *Desaturations based on 3% or greater drop from baseline.   Cheyne Stokes Breathing: None Present    Heart Rate Summary:  Average Heart Rate During Sleep 67.7 bpm      Highest Heart Rate During Sleep (95th %) 71.0 bpm      Highest Heart Rate During Sleep 93 bpm       Highest Heart Rate During Recording (TIB) 197 bpm (artifact)   Heart Rate Observations: Event Type # Events   Bradycardia 0 Lowest HR Scored: N/A  Sinus Tachycardia During Sleep 0 Highest HR Scored: N/A  Narrow Complex Tachycardia 0 Highest HR Scored: N/A  Wide Complex Tachycardia 0 Highest HR Scored: N/A  Asystole 0 Longest Pause: N/A  Atrial Fibrillation 0 Duration Longest Event: N/A  Other Arrythmias  No Type:   Periodic Limb Movement Data: (Primary legs unless otherwise noted) Total # Limb Movement 585 Limb Movement Index 106.2  Total # PLMS 577 PLMS Index 104.8  Total # PLMS Arousals 2 PLMS Arousal Index 0.4  Percentage Sleep Time with PLMS 209.70mn (63.4 % sleep)  Mean Duration limb movements (secs) 1257.4   Brief Summary: The pt appeared to tolerate CPAP well during the study.    Starting pressure:  5cm of H2O Maximum Pressure:  7cm of H2O  Mask Type: ResMed AirFit P10 Mask Size: Medium  Humidifier used: Yes Chin Strap used: No  CFlex: EPR 1 BiFlex: n/a    IPAP Level (cmH2O) EPAP Level (cmH2O) Total Duration (min) Sleep Duration (min) Sleep (%) REM (%) CA  #) OA # MA # HYP #) AHI (#/hr) RERAs # RERAs (#/hr) RDI (#/hr)  5 5 128.7 119.2 92.6 0.0 0 0 0 2 1.0 0 0.0 1.0  6 6 167.9 154.4 92.0 30.1 0 0 0 3 1.2 0 0.0 1.'2  7 7 '$ 83.3 56.2 67.5 2.4 0 0 0 0 0.0 0 0.0 0.0

## 2020-10-04 ENCOUNTER — Telehealth: Payer: Self-pay

## 2020-10-04 ENCOUNTER — Other Ambulatory Visit: Payer: Self-pay | Admitting: Internal Medicine

## 2020-10-04 NOTE — Telephone Encounter (Signed)
Left vm for 10/07/20 appointment screening-Toni

## 2020-10-04 NOTE — Telephone Encounter (Signed)
CPAP MACHINE ORDER SENT TO [facility].

## 2020-10-07 ENCOUNTER — Ambulatory Visit (INDEPENDENT_AMBULATORY_CARE_PROVIDER_SITE_OTHER): Payer: BC Managed Care – PPO

## 2020-10-07 ENCOUNTER — Other Ambulatory Visit: Payer: Self-pay

## 2020-10-07 DIAGNOSIS — E538 Deficiency of other specified B group vitamins: Secondary | ICD-10-CM | POA: Diagnosis not present

## 2020-10-08 MED ORDER — CYANOCOBALAMIN 1000 MCG/ML IJ SOLN
1000.0000 ug | Freq: Once | INTRAMUSCULAR | Status: AC
  Administered 2020-10-08: 1000 ug via INTRAMUSCULAR

## 2020-10-14 ENCOUNTER — Ambulatory Visit (INDEPENDENT_AMBULATORY_CARE_PROVIDER_SITE_OTHER): Payer: BC Managed Care – PPO

## 2020-10-14 ENCOUNTER — Other Ambulatory Visit: Payer: Self-pay

## 2020-10-14 DIAGNOSIS — E538 Deficiency of other specified B group vitamins: Secondary | ICD-10-CM

## 2020-10-14 MED ORDER — CYANOCOBALAMIN 1000 MCG/ML IJ SOLN
1000.0000 ug | Freq: Once | INTRAMUSCULAR | Status: AC
  Administered 2020-10-14: 1000 ug via INTRAMUSCULAR

## 2020-11-04 ENCOUNTER — Other Ambulatory Visit: Payer: Self-pay | Admitting: Nurse Practitioner

## 2020-11-04 ENCOUNTER — Other Ambulatory Visit: Payer: Self-pay | Admitting: Physician Assistant

## 2020-11-04 DIAGNOSIS — M150 Primary generalized (osteo)arthritis: Secondary | ICD-10-CM

## 2020-11-04 DIAGNOSIS — Z1231 Encounter for screening mammogram for malignant neoplasm of breast: Secondary | ICD-10-CM

## 2020-11-26 ENCOUNTER — Ambulatory Visit (INDEPENDENT_AMBULATORY_CARE_PROVIDER_SITE_OTHER): Payer: BC Managed Care – PPO

## 2020-11-26 ENCOUNTER — Other Ambulatory Visit: Payer: Self-pay

## 2020-11-26 ENCOUNTER — Ambulatory Visit: Payer: BC Managed Care – PPO | Admitting: Internal Medicine

## 2020-11-26 ENCOUNTER — Encounter: Payer: Self-pay | Admitting: Internal Medicine

## 2020-11-26 VITALS — BP 139/84 | HR 78 | Temp 98.6°F | Resp 16 | Ht 66.0 in | Wt 294.4 lb

## 2020-11-26 DIAGNOSIS — E538 Deficiency of other specified B group vitamins: Secondary | ICD-10-CM | POA: Diagnosis not present

## 2020-11-26 DIAGNOSIS — R0602 Shortness of breath: Secondary | ICD-10-CM

## 2020-11-26 DIAGNOSIS — K219 Gastro-esophageal reflux disease without esophagitis: Secondary | ICD-10-CM | POA: Diagnosis not present

## 2020-11-26 DIAGNOSIS — R059 Cough, unspecified: Secondary | ICD-10-CM

## 2020-11-26 DIAGNOSIS — G4733 Obstructive sleep apnea (adult) (pediatric): Secondary | ICD-10-CM

## 2020-11-26 DIAGNOSIS — Z6841 Body Mass Index (BMI) 40.0 and over, adult: Secondary | ICD-10-CM

## 2020-11-26 MED ORDER — CYANOCOBALAMIN 1000 MCG/ML IJ SOLN
1000.0000 ug | Freq: Once | INTRAMUSCULAR | Status: AC
  Administered 2020-11-26: 1000 ug via INTRAMUSCULAR

## 2020-11-26 NOTE — Patient Instructions (Signed)
Sleep Apnea Sleep apnea affects breathing during sleep. It causes breathing to stop for 10 seconds or more, or to become shallow. People with sleep apnea usually snoreloudly. It can also increase the risk of: Heart attack. Stroke. Being very overweight (obese). Diabetes. Heart failure. Irregular heartbeat. High blood pressure. The goal of treatment is to help you breathe normally again. What are the causes?  The most common cause of this condition is a collapsed or blocked airway. There are three kinds of sleep apnea: Obstructive sleep apnea. This is caused by a blocked or collapsed airway. Central sleep apnea. This happens when the brain does not send the right signals to the muscles that control breathing. Mixed sleep apnea. This is a combination of obstructive and central sleep apnea. What increases the risk? Being overweight. Smoking. Having a small airway. Being older. Being female. Drinking alcohol. Taking medicines to calm yourself (sedatives or tranquilizers). Having family members with the condition. Having a tongue or tonsils that are larger than normal. What are the signs or symptoms? Trouble staying asleep. Loud snoring. Headaches in the morning. Waking up gasping. Dry mouth or sore throat in the morning. Being sleepy or tired during the day. If you are sleepy or tired during the day, you may also: Not be able to focus your mind (concentrate). Forget things. Get angry a lot and have mood swings. Feel sad (depressed). Have changes in your personality. Have less interest in sex, if you are female. Be unable to have an erection, if you are female. How is this treated?  Sleeping on your side. Using a medicine to get rid of mucus in your nose (decongestant). Avoiding the use of alcohol, medicines to help you relax, or certain pain medicines (narcotics). Losing weight, if needed. Changing your diet. Quitting smoking. Using a machine to open your airway while you  sleep, such as: An oral appliance. This is a mouthpiece that shifts your lower jaw forward. A CPAP device. This device blows air through a mask when you breathe out (exhale). An EPAP device. This has valves that you put in each nostril. A BPAP device. This device blows air through a mask when you breathe in (inhale) and breathe out. Having surgery if other treatments do not work. Follow these instructions at home: Lifestyle Make changes that your doctor recommends. Eat a healthy diet. Lose weight if needed. Avoid alcohol, medicines to help you relax, and some pain medicines. Do not smoke or use any products that contain nicotine or tobacco. If you need help quitting, ask your doctor. General instructions Take over-the-counter and prescription medicines only as told by your doctor. If you were given a machine to use while you sleep, use it only as told by your doctor. If you are having surgery, make sure to tell your doctor you have sleep apnea. You may need to bring your device with you. Keep all follow-up visits. Contact a doctor if: The machine that you were given to use during sleep bothers you or does not seem to be working. You do not get better. You get worse. Get help right away if: Your chest hurts. You have trouble breathing in enough air. You have an uncomfortable feeling in your back, arms, or stomach. You have trouble talking. One side of your body feels weak. A part of your face is hanging down. These symptoms may be an emergency. Get help right away. Call your local emergency services (911 in the [place] Do not wait to see if the symptoms   will go away. Do not drive yourself to the hospital. Summary This condition affects breathing during sleep. The most common cause is a collapsed or blocked airway. The goal of treatment is to help you breathe normally while you sleep. This information is not intended to replace advice given to you by your health care provider. Make  sure you discuss any questions you have with your healthcare provider. Document Revised: 02/02/2020 Document Reviewed: 02/02/2020 Elsevier Patient Education  2022 Elsevier Inc.  

## 2020-11-26 NOTE — Progress Notes (Signed)
[facility] [place], [place] [address]  Pulmonary Sleep Medicine   Office Visit Note  Patient Name: Shelley [name] DOB: Jun 28, 1960 MRN [id number]  Date of Service: 11/26/2020  Complaints/HPI: OSA patient has significant obstructive sleep apnea she states she is willing to try the CPAP therapy.  Basically the sleep study revealed significant improvement with the CPAP and it appears her a CPAP of 7 was adequate to control her apneas.  She seemed to tolerate it well she is noted wants to go ahead and try using it now  ROS  General: (-) fever, (-) chills, (-) night sweats, (-) weakness Skin: (-) rashes, (-) itching,. Eyes: (-) visual changes, (-) redness, (-) itching. Nose and Sinuses: (-) nasal stuffiness or itchiness, (-) postnasal drip, (-) nosebleeds, (-) sinus trouble. Mouth and Throat: (-) sore throat, (-) hoarseness. Neck: (-) swollen glands, (-) enlarged thyroid, (-) neck pain. Respiratory: + cough, (-) bloody sputum, + shortness of breath, - wheezing. Cardiovascular: - ankle swelling, (-) chest pain. Lymphatic: (-) lymph node enlargement. Neurologic: (-) numbness, (-) tingling. Psychiatric: (-) anxiety, (-) depression   Current Medication: Outpatient Encounter Medications as of 11/26/2020  Medication Sig   albuterol (VENTOLIN HFA) 108 (90 Base) MCG/ACT inhaler Inhale 2 puffs into the lungs every 6 (six) hours as needed for wheezing or shortness of breath.   ALPRAZolam (XANAX) 0.25 MG tablet Take half tab twice a day as needed for severe panic attacks   azelastine (ASTELIN) 0.1 % nasal spray Place 2 sprays into both nostrils 2 (two) times daily. Use in each nostril as directed   diltiazem (CARDIZEM CD) 180 MG 24 hr capsule Take 1 capsule (180 mg total) by mouth daily.   escitalopram (LEXAPRO) 10 MG tablet TAKE 1 TABLET BY MOUTH ONCE DAILY WITH  SUPPER  FOR  PANIC  ATTACKS   meclizine (ANTIVERT) 25 MG tablet Take 25 mg by mouth 3 (three) times  daily as needed.   meloxicam (MOBIC) 15 MG tablet Take 1 tablet (15 mg total) by mouth daily as needed.   montelukast (SINGULAIR) 10 MG tablet Take 1 tablet (10 mg total) by mouth at bedtime.   Multiple Vitamin (MULTIVITAMIN) tablet Take 1 tablet by mouth daily.   omeprazole (PRILOSEC) 40 MG capsule Take one tab po qd for heart burn   No facility-administered encounter medications on file as of 11/26/2020.    Surgical History: Past Surgical History:  Procedure Laterality Date   RIGHT/LEFT HEART CATH AND CORONARY ANGIOGRAPHY Bilateral 05/26/2019   Procedure: RIGHT/LEFT HEART CATH AND CORONARY ANGIOGRAPHY;  Surgeon: Nelva Bush, MD;  Location: [facility];  Service: Cardiovascular;  Laterality: Bilateral;   TUBAL LIGATION      Medical History: Past Medical History:  Diagnosis Date   Anemia    Anxiety    Arthritis    Cardiomegaly    Family history of breast cancer    Family history of ovarian cancer    5/22 cancer genetic testing letter sent   Hypertension    Pulmonary hypertension ([place])    Sleep apnea    Thyroid disease    Vertigo     Family History: Family History  Problem Relation Age of Onset   Cancer Other    Breast cancer Maternal Grandmother    Breast cancer Cousin        1st maternal cousin   Heart attack Mother    Ovarian cancer Mother    Cancer Sister     Social History:  Social History   Socioeconomic History   Marital status: Single    Spouse name: Not on file   Number of children: Not on file   Years of education: Not on file   Highest education level: Not on file  Occupational History   Not on file  Tobacco Use   Smoking status: Never   Smokeless tobacco: Never  Vaping Use   Vaping Use: Never used  Substance and Sexual Activity   Alcohol use: No   Drug use: No   Sexual activity: Yes    Birth control/protection: I.U.D.  Other Topics Concern   Not on file  Social History Narrative   Not on file   Social Determinants of Health    Financial Resource Strain: Not on file  Food Insecurity: Not on file  Transportation Needs: Not on file  Physical Activity: Not on file  Stress: Not on file  Social Connections: Not on file  Intimate Partner Violence: Not on file    Vital Signs: Blood pressure 139/84, pulse 78, temperature 98.6 F (37 C), resp. rate 16, height 5\' 6"  (1.676 m), weight 294 lb 6.4 oz (133.5 kg), SpO2 98 %.  Examination: General Appearance: The patient is well-developed, well-nourished, and in no distress. Skin: Gross inspection of skin unremarkable. Head: normocephalic, no gross deformities. Eyes: no gross deformities noted. ENT: ears appear grossly normal no exudates. Neck: Supple. No thyromegaly. No LAD. Respiratory: no rhonchi noted. Cardiovascular: Normal S1 and S2 without murmur or rub. Extremities: No cyanosis. pulses are equal. Neurologic: Alert and oriented. No involuntary movements.  LABS: Recent Results (from the past 2160 hour(s))  Vitamin B12     Status: None   Collection Time: 09/13/20  3:38 PM  Result Value Ref Range   Vitamin B-12 297 232 - 1,245 pg/mL  Folate     Status: None   Collection Time: 09/13/20  3:38 PM  Result Value Ref Range   Folate 10.8 >3.0 ng/mL    Comment: A serum folate concentration of less than 3.1 ng/mL is considered to represent clinical deficiency.   Iron and TIBC     Status: Abnormal   Collection Time: 09/13/20  3:38 PM  Result Value Ref Range   Total Iron Binding Capacity 219 (L) 250 - 450 ug/dL   UIBC 165 131 - 425 ug/dL   Iron 54 27 - 159 ug/dL   Iron Saturation 25 15 - 55 %  Ferritin     Status: Abnormal   Collection Time: 09/13/20  3:38 PM  Result Value Ref Range   Ferritin 288 (H) 15 - 150 ng/mL    Radiology: DG UGI W DOUBLE CM (HD BA)  Result Date: 09/27/2020 CLINICAL DATA:  Cough, GERD EXAM: UPPER GI SERIES WITHOUT KUB TECHNIQUE: Routine upper GI series was performed with thin/high density/water soluble barium. FLUOROSCOPY TIME:   Fluoroscopy Time:  30 seconds Radiation Exposure Index (if provided by the fluoroscopic device): 5 mGy Number of Acquired Spot Images: 0 COMPARISON:  None. FINDINGS: UPPER GI SERIES: Examination of the esophagus demonstrated normal esophageal motility. Normal esophageal morphology without evidence of esophagitis or ulceration. No esophageal stricture, diverticula, or mass lesion. Small hiatal hernia. Severe gastroesophageal reflux extending to the cervical esophagus. Examination of the stomach demonstrated normal rugal folds and areae gastricae. Gastric mucosa appeared unremarkable without evidence of ulceration, scarring, or mass lesion. Gastric motility and emptying was normal. Fluoroscopic examination of the duodenum demonstrates normal motility and morphology without evidence of ulceration or mass lesion. IMPRESSION: 1.  Severe gastroesophageal reflux extending to the cervical esophagus. 2. Small hiatal hernia. 3. Otherwise unremarkable upper GI. Electronically Signed   By: Kathreen Devoid   On: 09/27/2020 09:59    No results found.  No results found.    Assessment and Plan: Patient Active Problem List   Diagnosis Date Noted   OSA (obstructive sleep apnea) 09/16/2020   Other hypersomnia 09/16/2020   Encounter for general adult medical examination with abnormal findings 06/19/2019   Encounter for screening mammogram for malignant neoplasm of breast 06/19/2019   Pulmonary hypertension (HCC)    Shortness of breath    Pulmonary hypertension, primary ([place]) 01/04/2019   Acute pain of left shoulder 11/23/2018   Cardiomegaly 11/23/2018   Abnormal ECG 11/23/2018   Acquired hypothyroidism 05/22/2018   Seasonal allergies 05/22/2018   Acute anxiety 05/22/2018   Dysuria 05/22/2018   Hypertension 01/05/2018   Routine cervical smear 01/05/2018   Elevated ferritin 11/26/2015   Thrombocytosis 11/26/2015   1. SOB (shortness of breath) We did do a follow-up on her spirometry the results of which were  reviewed I think part of her symptoms may very well be coming from the severe reflux esophagitis that she has which may be causing nocturnal aspiration causing her shortness of breath asthmatic symptoms. - Spirometry with Graph  2. OSA (obstructive sleep apnea) Will need to start her on CPAP on a pressure of 7 - For home use only DME continuous positive airway pressure (CPAP)  3. Gastroesophageal reflux disease without esophagitis I think the best option because of the severity of her reflux will be to have a surgical evaluation in addition to weight loss diet exercise management - Ambulatory referral to General Surgery  4. Cough Related likely to the reflux as above   5. Morbid obesity with BMI of 45.0-49.9, adult ([place]) Obesity Counseling: Had a lengthy discussion regarding patients BMI and weight issues. Patient was instructed on portion control as well as increased activity. Also discussed caloric restrictions with trying to maintain intake less than 2000 Kcal. Discussions were made in accordance with the 5As of weight management. Simple actions such as not eating late and if able to, taking a walk is suggested.     General Counseling: I have discussed the findings of the evaluation and examination with Aleisa.  I have also discussed any further diagnostic evaluation thatmay be needed or ordered today. Nimo verbalizes understanding of the findings of todays visit. We also reviewed her medications today and discussed drug interactions and side effects including but not limited excessive drowsiness and altered mental states. We also discussed that there is always a risk not just to her but also people around her. she has been encouraged to call the office with any questions or concerns that should arise related to todays visit.  Orders Placed This Encounter  Procedures   Spirometry with Graph    Order Specific Question:   Where should this test be performed?    Answer:   [facility]    Order Specific Question:   Basic spirometry    Answer:   Yes     Time spent: 16  I have personally obtained a history, examined the patient, evaluated laboratory and imaging results, formulated the assessment and plan and placed orders.    Allyne Gee, MD [facility] Pulmonary and Critical Care Sleep medicine

## 2020-11-26 NOTE — Progress Notes (Signed)
Pt was here today for B12 injection

## 2020-11-28 ENCOUNTER — Ambulatory Visit: Payer: BC Managed Care – PPO | Admitting: Physician Assistant

## 2020-11-29 ENCOUNTER — Encounter: Payer: Self-pay | Admitting: Physician Assistant

## 2020-11-29 ENCOUNTER — Other Ambulatory Visit: Payer: Self-pay

## 2020-11-29 ENCOUNTER — Ambulatory Visit (INDEPENDENT_AMBULATORY_CARE_PROVIDER_SITE_OTHER): Payer: BC Managed Care – PPO | Admitting: Physician Assistant

## 2020-11-29 DIAGNOSIS — G4733 Obstructive sleep apnea (adult) (pediatric): Secondary | ICD-10-CM

## 2020-11-29 DIAGNOSIS — I10 Essential (primary) hypertension: Secondary | ICD-10-CM

## 2020-11-29 DIAGNOSIS — M150 Primary generalized (osteo)arthritis: Secondary | ICD-10-CM

## 2020-11-29 DIAGNOSIS — Z6841 Body Mass Index (BMI) 40.0 and over, adult: Secondary | ICD-10-CM

## 2020-11-29 DIAGNOSIS — K449 Diaphragmatic hernia without obstruction or gangrene: Secondary | ICD-10-CM | POA: Diagnosis not present

## 2020-11-29 DIAGNOSIS — K219 Gastro-esophageal reflux disease without esophagitis: Secondary | ICD-10-CM

## 2020-11-29 MED ORDER — METOCLOPRAMIDE HCL 5 MG PO TABS: 5.0000 mg | ORAL_TABLET | Freq: Every day | ORAL | 0 refills | Status: DC

## 2020-11-29 MED ORDER — MELOXICAM 15 MG PO TABS: 15.0000 mg | ORAL_TABLET | Freq: Every day | ORAL | 1 refills | Status: DC | PRN

## 2020-11-29 NOTE — Progress Notes (Signed)
[facility] [place], [place] [address]  Internal MEDICINE  Office Visit Note  Patient Name: Shelley [name]  [id number]  [id number]  Date of Service: 11/29/2020  Chief Complaint  Patient presents with   Follow-up    Upper GI result   Anxiety   Hypertension    HPI Pt is here for routine follow up and to go over upper GI results -Discussed that upper GI showed severe GERD as well as small hiatal hernia. She is already on 40mg  omeprzole. Will have her increase to BID and add reglan in evening to help with symptoms management. She does have a cough still and some nausea. -She had her OSA follow up on Monday and states a GS referral was already placed to evaluate hiatal hernia. -CPAP ordered, on oxygen at night currently, but states she did well during sleep study without oxygen and may or may not need this to be bled through CPAP or if oxygen will be maintained with CPAP alone. -She also wants to discuss weight loss options once acute symptoms under control. Did discuss that getting on CPAP may help boost energy to enable wt loss and that wt loss further helps improve OSA  Current Medication: Outpatient Encounter Medications as of 11/29/2020  Medication Sig   albuterol (VENTOLIN HFA) 108 (90 Base) MCG/ACT inhaler Inhale 2 puffs into the lungs every 6 (six) hours as needed for wheezing or shortness of breath.   ALPRAZolam (XANAX) 0.25 MG tablet Take half tab twice a day as needed for severe panic attacks   azelastine (ASTELIN) 0.1 % nasal spray Place 2 sprays into both nostrils 2 (two) times daily. Use in each nostril as directed   diltiazem (CARDIZEM CD) 180 MG 24 hr capsule Take 1 capsule (180 mg total) by mouth daily.   escitalopram (LEXAPRO) 10 MG tablet TAKE 1 TABLET BY MOUTH ONCE DAILY WITH  SUPPER  FOR  PANIC  ATTACKS   meclizine (ANTIVERT) 25 MG tablet Take 25 mg by mouth 3 (three) times daily as needed.   metoCLOPramide (REGLAN) 5 MG tablet Take 1  tablet (5 mg total) by mouth daily.   montelukast (SINGULAIR) 10 MG tablet Take 1 tablet (10 mg total) by mouth at bedtime.   Multiple Vitamin (MULTIVITAMIN) tablet Take 1 tablet by mouth daily.   omeprazole (PRILOSEC) 40 MG capsule Take one tab po qd for heart burn   [DISCONTINUED] meloxicam (MOBIC) 15 MG tablet Take 1 tablet (15 mg total) by mouth daily as needed.   meloxicam (MOBIC) 15 MG tablet Take 1 tablet (15 mg total) by mouth daily as needed.   No facility-administered encounter medications on file as of 11/29/2020.    Surgical History: Past Surgical History:  Procedure Laterality Date   RIGHT/LEFT HEART CATH AND CORONARY ANGIOGRAPHY Bilateral 05/26/2019   Procedure: RIGHT/LEFT HEART CATH AND CORONARY ANGIOGRAPHY;  Surgeon: Nelva Bush, MD;  Location: [place] [facility];  Service: Cardiovascular;  Laterality: Bilateral;   TUBAL LIGATION      Medical History: Past Medical History:  Diagnosis Date   Anemia    Anxiety    Arthritis    Cardiomegaly    Family history of breast cancer    Family history of ovarian cancer    5/22 cancer genetic testing letter sent   Hypertension    Pulmonary hypertension ([place])    Sleep apnea    Thyroid disease    Vertigo     Family History: Family History  Problem Relation  Age of Onset   Cancer Other    Breast cancer Maternal Grandmother    Breast cancer Cousin        1st maternal cousin   Heart attack Mother    Ovarian cancer Mother    Cancer Sister     Social History   Socioeconomic History   Marital status: Single    Spouse name: Not on file   Number of children: Not on file   Years of education: Not on file   Highest education level: Not on file  Occupational History   Not on file  Tobacco Use   Smoking status: Never   Smokeless tobacco: Never  Vaping Use   Vaping Use: Never used  Substance and Sexual Activity   Alcohol use: No   Drug use: No   Sexual activity: Yes    Birth control/protection: I.U.D.  Other  Topics Concern   Not on file  Social History Narrative   Not on file   Social Determinants of Health   Financial Resource Strain: Not on file  Food Insecurity: Not on file  Transportation Needs: Not on file  Physical Activity: Not on file  Stress: Not on file  Social Connections: Not on file  Intimate Partner Violence: Not on file      Review of Systems  Constitutional:  Positive for fatigue. Negative for chills and unexpected weight change.  HENT:  Negative for congestion, postnasal drip, rhinorrhea, sneezing and sore throat.   Eyes:  Negative for redness.  Respiratory:  Positive for cough. Negative for chest tightness, shortness of breath and wheezing.   Cardiovascular:  Negative for chest pain and palpitations.  Gastrointestinal:  Negative for abdominal pain, constipation, diarrhea, nausea and vomiting.       GERD  Genitourinary:  Negative for dysuria and frequency.  Musculoskeletal:  Negative for arthralgias, back pain, joint swelling and neck pain.  Skin:  Negative for rash.  Neurological: Negative.  Negative for tremors and numbness.  Hematological:  Negative for adenopathy. Does not bruise/bleed easily.  Psychiatric/Behavioral:  Positive for sleep disturbance. Negative for behavioral problems (Depression) and suicidal ideas. The patient is not nervous/anxious.    Vital Signs: BP 136/80 Comment: 142/82  Pulse 67   Temp 98.3 F (36.8 C)   Resp 16   Ht 5\' 6"  (1.676 m)   Wt 294 lb (133.4 kg)   SpO2 99%   BMI 47.45 kg/m    Physical Exam Vitals and nursing note reviewed.  Constitutional:      General: She is not in acute distress.    Appearance: She is well-developed. She is obese. She is not diaphoretic.  HENT:     Head: Normocephalic and atraumatic.     Mouth/Throat:     Pharynx: No oropharyngeal exudate.  Eyes:     Pupils: Pupils are equal, round, and reactive to light.  Neck:     Thyroid: No thyromegaly.     Vascular: No JVD.     Trachea: No tracheal  deviation.  Cardiovascular:     Rate and Rhythm: Normal rate and regular rhythm.     Heart sounds: Normal heart sounds. No murmur heard.   No friction rub. No gallop.  Pulmonary:     Effort: Pulmonary effort is normal. No respiratory distress.     Breath sounds: No wheezing or rales.  Chest:     Chest wall: No tenderness.  Abdominal:     General: Bowel sounds are normal.     Palpations: Abdomen  is soft.  Musculoskeletal:        General: Normal range of motion.     Cervical back: Normal range of motion and neck supple.  Lymphadenopathy:     Cervical: No cervical adenopathy.  Skin:    General: Skin is warm and dry.  Neurological:     Mental Status: She is alert and oriented to person, place, and time.     Cranial Nerves: No cranial nerve deficit.  Psychiatric:        Behavior: Behavior normal.        Thought Content: Thought content normal.        Judgment: Judgment normal.       Assessment/Plan: 1. Essential hypertension Stable, Continue current medications  2. Gastroesophageal reflux disease without esophagitis Will have her temporarily increase omeprazole to BID and add reglan at night. Patient has been referred for eval of hiatal hernia and may need further work up with GI - metoCLOPramide (REGLAN) 5 MG tablet; Take 1 tablet (5 mg total) by mouth daily.  Dispense: 30 tablet; Refill: 0  3. Hiatal hernia Referred to general surgery  4. OSA (obstructive sleep apnea) Awaiting CPAP  5. Primary generalized (osteo)arthritis May continue mobic - meloxicam (MOBIC) 15 MG tablet; Take 1 tablet (15 mg total) by mouth daily as needed.  Dispense: 90 tablet; Refill: 1  6. Morbid obesity with BMI of 45.0-49.9, adult ([place]) Obesity Counseling: Had a lengthy discussion regarding patients BMI and weight issues. Patient was instructed on portion control as well as increased activity. Also discussed caloric restrictions with trying to maintain intake less than 2000 Kcal. Discussions  were made in accordance with the 5As of weight management. Simple actions such as not eating late and if able to, taking a walk is suggested.    General Counseling: Maira verbalizes understanding of the findings of todays visit and agrees with plan of treatment. I have discussed any further diagnostic evaluation that may be needed or ordered today. We also reviewed her medications today. she has been encouraged to call the office with any questions or concerns that should arise related to todays visit.    No orders of the defined types were placed in this encounter.   Meds ordered this encounter  Medications   meloxicam (MOBIC) 15 MG tablet    Sig: Take 1 tablet (15 mg total) by mouth daily as needed.    Dispense:  90 tablet    Refill:  1   metoCLOPramide (REGLAN) 5 MG tablet    Sig: Take 1 tablet (5 mg total) by mouth daily.    Dispense:  30 tablet    Refill:  0    This patient was seen by Drema Dallas, PA-C in collaboration with Dr. Clayborn Bigness as a part of collaborative care agreement.   Total time spent:30 Minutes Time spent includes review of chart, medications, test results, and follow up plan with the patient.      Dr Lavera Guise Internal medicine

## 2020-12-01 ENCOUNTER — Encounter: Payer: Self-pay | Admitting: Internal Medicine

## 2020-12-02 ENCOUNTER — Ambulatory Visit
Admission: RE | Admit: 2020-12-02 | Discharge: 2020-12-02 | Disposition: A | Discharge status: Home / Self Care | Payer: BC Managed Care – PPO | Source: Ambulatory Visit | Attending: Physician Assistant | Admitting: Physician Assistant

## 2020-12-02 ENCOUNTER — Other Ambulatory Visit: Payer: Self-pay

## 2020-12-02 DIAGNOSIS — Z1231 Encounter for screening mammogram for malignant neoplasm of breast: Secondary | ICD-10-CM | POA: Diagnosis not present

## 2020-12-12 DIAGNOSIS — S91331A Puncture wound without foreign body, right foot, initial encounter: Secondary | ICD-10-CM | POA: Diagnosis not present

## 2020-12-12 DIAGNOSIS — M79671 Pain in right foot: Secondary | ICD-10-CM | POA: Diagnosis not present

## 2020-12-12 DIAGNOSIS — Z23 Encounter for immunization: Secondary | ICD-10-CM | POA: Diagnosis not present

## 2020-12-26 ENCOUNTER — Other Ambulatory Visit: Payer: Self-pay

## 2020-12-26 ENCOUNTER — Ambulatory Visit: Payer: BC Managed Care – PPO

## 2020-12-26 DIAGNOSIS — E538 Deficiency of other specified B group vitamins: Secondary | ICD-10-CM

## 2020-12-27 MED ORDER — CYANOCOBALAMIN 1000 MCG/ML IJ SOLN
1000.0000 ug | Freq: Once | INTRAMUSCULAR | Status: AC
  Administered 2020-12-27: 1000 ug via INTRAMUSCULAR

## 2020-12-30 ENCOUNTER — Telehealth: Payer: Self-pay

## 2020-12-30 ENCOUNTER — Other Ambulatory Visit: Payer: Self-pay

## 2020-12-30 ENCOUNTER — Encounter: Payer: Self-pay | Admitting: Surgery

## 2020-12-30 ENCOUNTER — Ambulatory Visit: Payer: BC Managed Care – PPO | Admitting: Surgery

## 2020-12-30 VITALS — BP 134/89 | HR 70 | Temp 98.2°F | Ht 66.0 in | Wt 297.0 lb

## 2020-12-30 DIAGNOSIS — K219 Gastro-esophageal reflux disease without esophagitis: Secondary | ICD-10-CM

## 2020-12-30 DIAGNOSIS — K449 Diaphragmatic hernia without obstruction or gangrene: Secondary | ICD-10-CM

## 2020-12-30 NOTE — Patient Instructions (Addendum)
We have sent a referral to [facility] to have an upper endoscopy done. They will call you to set this up. Continue to work at loosing weight. We will have you follow up here in a few months.   Hiatal Hernia A hiatal hernia occurs when part of the stomach slides above the muscle that separates the abdomen from the chest (diaphragm). A person can be born with a hiatal hernia (congenital), or it may develop over time. In almost all cases of hiatal hernia, only the top part of the stomach pushes through the diaphragm. Many people have a hiatal hernia with no symptoms. The larger the hernia, the more likely it is that you will have symptoms. In some cases, a hiatal hernia allows stomach acid to flow back into the tube that carries food from your mouth to your stomach (esophagus). This may cause heartburn symptoms. Severe heartburn symptoms may mean that you have developed a condition called gastroesophageal reflux disease (GERD). What are the causes? This condition is caused by a weakness in the opening (hiatus) where the esophagus passes through the diaphragm to attach to the upper part of the stomach. A person may be born with a weakness in the hiatus, or a weakness can develop over time. What increases the risk? This condition is more likely to develop in: Older people. Age is a major risk factor for a hiatal hernia, especially if you are over the age of 34. Pregnant women. People who are overweight. People who have frequent constipation. What are the signs or symptoms? Symptoms of this condition usually develop in the form of GERD symptoms. Symptoms include: Heartburn. Belching. Indigestion. Trouble swallowing. Coughing or wheezing. Sore throat. Hoarseness. Chest pain. Nausea and vomiting. How is this diagnosed? This condition may be diagnosed during testing for GERD. Tests that may be done include: X-rays of your stomach or chest. An upper gastrointestinal (GI) series.  This is an X-ray exam of your GI tract that is taken after you swallow a chalky liquid that shows up clearly on the X-ray. Endoscopy. This is a procedure to look into your stomach using a thin, flexible tube that has a tiny camera and light on the end of it. How is this treated? This condition may be treated by: Dietary and lifestyle changes to help reduce GERD symptoms. Medicines. These may include: Over-the-counter antacids. Medicines that make your stomach empty more quickly. Medicines that block the production of stomach acid (H2 blockers). Stronger medicines to reduce stomach acid (proton pump inhibitors). Surgery to repair the hernia, if other treatments are not helping. If you have no symptoms, you may not need treatment. Follow these instructions at home: Lifestyle and activity Do not use any products that contain nicotine or tobacco, such as cigarettes and e-cigarettes. If you need help quitting, ask your health care provider. Try to achieve and maintain a healthy body weight. Avoid putting pressure on your abdomen. Anything that puts pressure on your abdomen increases the amount of acid that may be pushed up into your esophagus. Avoid bending over, especially after eating. Raise the head of your bed by putting blocks under the legs. This keeps your head and esophagus higher than your stomach. Do not wear tight clothing around your chest or stomach. Try not to strain when having a bowel movement, when urinating, or when lifting heavy objects. Eating and drinking Avoid foods that can worsen GERD symptoms. These may include: Fatty foods, like fried foods. Citrus fruits, like oranges or lemon. Other foods  and drinks that contain acid, like orange juice or tomatoes. Spicy food. Chocolate. Eat frequent small meals instead of three large meals a day. This helps prevent your stomach from getting too full. Eat slowly. Do not lie down right after eating. Do not eat 1-2 hours before  bed. Do not drink beverages with caffeine. These include cola, coffee, cocoa, and tea. Do not drink alcohol. General instructions Take over-the-counter and prescription medicines only as told by your health care provider. Keep all follow-up visits as told by your health care provider. This is important. Contact a health care provider if: Your symptoms are not controlled with medicines or lifestyle changes. You are having trouble swallowing. You have coughing or wheezing that will not go away. Get help right away if: Your pain is getting worse. Your pain spreads to your arms, neck, jaw, teeth, or back. You have shortness of breath. You sweat for no reason. You feel sick to your stomach (nauseous) or you vomit. You vomit blood. You have bright red blood in your stools. You have black, tarry stools. Summary A hiatal hernia occurs when part of the stomach slides above the muscle that separates the abdomen from the chest (diaphragm). A person may be born with a weakness in the hiatus, or a weakness can develop over time. Symptoms of hiatal hernia may include heartburn, trouble swallowing, or sore throat. Management of hiatal hernia includes eating frequent small meals instead of three large meals a day. Get help right away if you vomit blood, have bright red blood in your stools, or have black, tarry stools. This information is not intended to replace advice given to you by your health care provider. Make sure you discuss any questions you have with your health care provider. Document Revised: 01/25/2020 Document Reviewed: 01/25/2020 Elsevier Patient Education  2022 [employer].

## 2020-12-30 NOTE — Telephone Encounter (Signed)
Left message on voicemail requesting pt to return call to schedule her New pt appt for tomorrow with Dr Bonna Gains as she has opened clinic

## 2021-01-01 ENCOUNTER — Encounter: Payer: Self-pay | Admitting: Surgery

## 2021-01-01 NOTE — Progress Notes (Signed)
Patient ID: Shelley [name], female   DOB: [birth year], 60 y.o.   MRN: [id number]  HPI Shelley [name] is a 60 y.o. female seen in consultation at the request of Dr. Humphrey Rolls.  She does have a history of chronic pulmonary symptoms and uses a CPAP machine at home.  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  She reports significant heartburn especially when she lays on her back.  Seems to be worsening at night and also endorses chronic cough.  She does not have any dysphagia.  No fevers no chills.  She did have a swallow evaluation already showing evidence of hiatal hernia and significant reflux.  Please note that have personally reviewed the images.  She reports that she have acid taste in her mouth and her reflux is quite severe.  She did have a prior history of a tubal ligation but no other abdominal operations.  She is active and is able to perform more than 4 METS of activity.  I had an extensive discussion about optimization of weight.  Ideally would like her to be below 220.  I do understand that this is extremely difficult .  I expressed my concerns of performing hiatal hernia repair given her BMI.  She is very motivated to lose weight.  She also wishes to continue work-up therefore I will arrange for an endoscopy as well as a CT scan of the abdomen pelvis to evaluate the intra-abdominal anatomy.  She did have recent labs including a CBC and CMP and they were normal.  HPI  Past Medical History:  Diagnosis Date   Anemia    Anxiety    Arthritis    Cardiomegaly    Family history of breast cancer    Family history of ovarian cancer    5/22 cancer genetic testing letter sent   Hypertension    Pulmonary hypertension ([place])    Sleep apnea    Thyroid disease    Vertigo     Past Surgical History:  Procedure Laterality Date   RIGHT/LEFT HEART CATH AND CORONARY ANGIOGRAPHY Bilateral 05/26/2019   Procedure: RIGHT/LEFT HEART CATH AND CORONARY ANGIOGRAPHY;  Surgeon: Nelva Bush, MD;  Location: [facility];  Service: Cardiovascular;  Laterality: Bilateral;   TUBAL LIGATION      Family History  Problem Relation Age of Onset   Heart attack Mother    Ovarian cancer Mother    Cancer Father    Cancer Sister    Breast cancer Maternal Grandmother    Breast cancer Cousin        1st maternal cousin   Cancer Other     Social History Social History   Tobacco Use   Smoking status: Never   Smokeless tobacco: Never  Vaping Use   Vaping Use: Never used  Substance Use Topics   Alcohol use: No   Drug use: No    Allergies  Allergen Reactions   Amlodipine     Current Outpatient Medications  Medication Sig Dispense Refill   albuterol (VENTOLIN HFA) 108 (90 Base) MCG/ACT inhaler Inhale 2 puffs into the lungs every 6 (six) hours as needed for wheezing or shortness of breath. 8 g 0   ALPRAZolam (XANAX) 0.25 MG tablet Take half tab twice a day as needed for severe panic attacks 30 tablet 0   azelastine (ASTELIN) 0.1 % nasal spray Place 2 sprays into both nostrils 2 (two) times daily. Use in each nostril as directed 30 mL 12  diclofenac Sodium (VOLTAREN) 1 % GEL SMARTSIG:Gram(s) Topical 4 Times Daily PRN     diltiazem (CARDIZEM CD) 180 MG 24 hr capsule Take 1 capsule (180 mg total) by mouth daily. 90 capsule 3   escitalopram (LEXAPRO) 10 MG tablet TAKE 1 TABLET BY MOUTH ONCE DAILY WITH  SUPPER  FOR  PANIC  ATTACKS 30 tablet 4   meclizine (ANTIVERT) 25 MG tablet Take 25 mg by mouth 3 (three) times daily as needed.     meloxicam (MOBIC) 15 MG tablet Take 1 tablet (15 mg total) by mouth daily as needed. 90 tablet 1   metoCLOPramide (REGLAN) 5 MG tablet Take 1 tablet (5 mg total) by mouth daily. 30 tablet 0   montelukast (SINGULAIR) 10 MG tablet Take 1 tablet (10 mg total) by mouth at bedtime. 90 tablet 3   Multiple Vitamin (MULTIVITAMIN) tablet Take 1 tablet by mouth daily.     omeprazole (PRILOSEC) 40 MG capsule Take one tab po qd for heart burn 90  capsule 1   No current facility-administered medications for this visit.     Review of Systems Full ROS  was asked and was negative except for the information on the HPI  Physical Exam Blood pressure 134/89, pulse 70, temperature 98.2 F (36.8 C), temperature source Oral, height 5\' 6"  (1.676 m), weight 297 lb (134.7 kg), SpO2 96 %. CONSTITUTIONAL: NAD BMI 47.9 EYES: Pupils are equal, round, , Sclera are non-icteric. EARS, NOSE, MOUTH AND THROAT: She is wearing a mask. Hearing is intact to voice. LYMPH NODES:  Lymph nodes in the neck are normal. RESPIRATORY:  Lungs are clear. There is normal respiratory effort, with equal breath sounds bilaterally, and without pathologic use of accessory muscles. CARDIOVASCULAR: Heart is regular without murmurs, gallops, or rubs. GI: The abdomen is  soft, nontender, and nondistended. There are no palpable masses. There is no hepatosplenomegaly. There are normal bowel sounds in all quadrants. GU: Rectal deferred.   MUSCULOSKELETAL: Normal muscle strength and tone. No cyanosis or edema.   SKIN: Turgor is good and there are no pathologic skin lesions or ulcers. NEUROLOGIC: Motor and sensation is grossly normal. Cranial nerves are grossly intact. PSYCH:  Oriented to person, place and time. Affect is normal.  Data Reviewed  I have personally reviewed the patient's imaging, laboratory findings and medical records.    Assessment/Plan 60 year old female with recalcitrant reflux causing significant cough from some exacerbation of pulmonary symptoms.  She does have evidence of a hiatal hernia but also has high BMI that technically is a contraindication for antireflux and hiatal hernia repair.  I had an extensive discussion with the patient and the family member.  I encouraged her by all means to optimize weight.  Is modified she is already on current weight management system as well as an exercise program.  I would like to proceed with further work-up and also see  her back in a few months.  I am willing to entertain doing surgical procedure if the patient loses 50 pounds at least. A copy of this report was sent to the referring provider. Time spent with the patient was 65 minutes, with more than 50% of the time spent in face-to-face education, counseling and care coordination.     Caroleen Hamman, MD FACS General Surgeon 01/01/2021, 9:15 PM

## 2021-01-07 ENCOUNTER — Other Ambulatory Visit: Payer: Self-pay | Admitting: Internal Medicine

## 2021-01-07 DIAGNOSIS — K219 Gastro-esophageal reflux disease without esophagitis: Secondary | ICD-10-CM

## 2021-01-17 ENCOUNTER — Encounter: Payer: Self-pay | Admitting: Cardiology

## 2021-01-17 ENCOUNTER — Other Ambulatory Visit: Payer: Self-pay

## 2021-01-17 ENCOUNTER — Ambulatory Visit: Payer: BC Managed Care – PPO | Admitting: Cardiology

## 2021-01-17 VITALS — BP 140/76 | HR 74 | Ht 66.0 in | Wt 293.0 lb

## 2021-01-17 DIAGNOSIS — I10 Essential (primary) hypertension: Secondary | ICD-10-CM | POA: Diagnosis not present

## 2021-01-17 NOTE — Progress Notes (Signed)
Cardiology Office Note:    Date:  01/17/2021   ID:  Shelley [name], DOB 08-15-1960, MRN [id number]  PCP:  Lavera Guise, MD  Cardiologist:  Kate Sable, MD  Electrophysiologist:  None   Referring MD: Lavera Guise, MD   Chief Complaint  Patient presents with   OTher    12 month follow up. Meds reviewed verbally with patient.      History of Present Illness:    Shelley [name] is a 60 y.o. female with a hx of hypertension, obesity who presents for follow-up.    Patient being seen for obesity and hypertension.  Previously was on losartan, this was switched to Cardizem per PCP.  BP checks via EMR been normal with systolics in the 093G.  Denies chest pain.  States eating healthier, working on losing weight.  Has been losing some weight slowly.  Feels well, has no concerns at this time.  Prior notes Outside echo date 11/25/2018 (report scanned in epic) briefly showed normal LVEF with EF 18%, normal diastolic function, RVSP of 54 (assumed RA pressure of 10), mild TR. she had an exercise treadmill stress test which was normal.  She also endorses snoring when she sleeps.  Had a sleep study which showed hypoxia during sleep.  She was started on oxygen by pulmonary medicine.  Right heart cath obtained 05/2019 showed wedge pressure of 14, mildly elevated pulmonary artery systolic pressure of 35.    Past Medical History:  Diagnosis Date   Anemia    Anxiety    Arthritis    Cardiomegaly    Family history of breast cancer    Family history of ovarian cancer    5/22 cancer genetic testing letter sent   Hypertension    Pulmonary hypertension ([place])    Sleep apnea    Thyroid disease    Vertigo     Past Surgical History:  Procedure Laterality Date   RIGHT/LEFT HEART CATH AND CORONARY ANGIOGRAPHY Bilateral 05/26/2019   Procedure: RIGHT/LEFT HEART CATH AND CORONARY ANGIOGRAPHY;  Surgeon: Nelva Bush, MD;  Location: [facility];  Service: Cardiovascular;  Laterality:  Bilateral;   TUBAL LIGATION      Current Medications: Current Meds  Medication Sig   albuterol (VENTOLIN HFA) 108 (90 Base) MCG/ACT inhaler Inhale 2 puffs into the lungs every 6 (six) hours as needed for wheezing or shortness of breath.   ALPRAZolam (XANAX) 0.25 MG tablet Take half tab twice a day as needed for severe panic attacks   azelastine (ASTELIN) 0.1 % nasal spray Place 2 sprays into both nostrils 2 (two) times daily. Use in each nostril as directed   diclofenac Sodium (VOLTAREN) 1 % GEL SMARTSIG:Gram(s) Topical 4 Times Daily PRN   diltiazem (CARDIZEM CD) 180 MG 24 hr capsule Take 1 capsule (180 mg total) by mouth daily.   escitalopram (LEXAPRO) 10 MG tablet TAKE 1 TABLET BY MOUTH ONCE DAILY WITH  SUPPER  FOR  PANIC  ATTACKS   meclizine (ANTIVERT) 25 MG tablet Take 25 mg by mouth 3 (three) times daily as needed.   meloxicam (MOBIC) 15 MG tablet Take 1 tablet (15 mg total) by mouth daily as needed.   metoCLOPramide (REGLAN) 5 MG tablet Take 1 tablet (5 mg total) by mouth daily.   montelukast (SINGULAIR) 10 MG tablet Take 1 tablet (10 mg total) by mouth at bedtime.   Multiple Vitamin (MULTIVITAMIN) tablet Take 1 tablet by mouth daily.   omeprazole (PRILOSEC) 40 MG capsule TAKE 1  CAPSULE BY MOUTH ONCE DAILY FOR HEARTBURN     Allergies:   Amlodipine   Social History   Socioeconomic History   Marital status: Single    Spouse name: Not on file   Number of children: Not on file   Years of education: Not on file   Highest education level: Not on file  Occupational History   Not on file  Tobacco Use   Smoking status: Never   Smokeless tobacco: Never  Vaping Use   Vaping Use: Never used  Substance and Sexual Activity   Alcohol use: No   Drug use: No   Sexual activity: Yes    Birth control/protection: I.U.D.  Other Topics Concern   Not on file  Social History Narrative   Not on file   Social Determinants of Health   Financial Resource Strain: Not on file  Food  Insecurity: Not on file  Transportation Needs: Not on file  Physical Activity: Not on file  Stress: Not on file  Social Connections: Not on file     Family History: The patient's family history includes Breast cancer in her cousin and maternal grandmother; Cancer in her father, sister, and another family member; Heart attack in her mother; Ovarian cancer in her mother.  ROS:   Please see the history of present illness.     All other systems reviewed and are negative.  EKGs/Labs/Other Studies Reviewed:    The following studies were reviewed today: Right and left heart cath, date 05/26/2019 No angiographically significant coronary artery disease involving the main coronary arteries.  There is tapering of the distal LAD that likely reflects a normal variant though mild to moderate diffuse disease cannot be excluded. Upper normal to mildly elevated left heart, right heart, and pulmonary artery pressures. Normal Fick cardiac output/index.  Right Heart Pressures RA (mean): 10 mmHg RV (S/EDP): 35/11 mmHg PA (S/D, mean): 35/17 (23) mmHg PCWP (mean): 14 mmHg  Ao sat: 96% PA sat: 71%  Fick CO: 7.4 L/min Fick CI: 3.2 L/min/m^2     EKG:  EKG is  ordered today.  The ekg ordered today demonstrates sinus rhythm, possible left atrial enlargement  Recent Labs: 06/17/2020: ALT 16; BUN 14; Creatinine, Ser 0.85; Hemoglobin 13.3; Platelets 367; Potassium 4.9; Sodium 139; TSH 1.230  Recent Lipid Panel    Component Value Date/Time   CHOL 148 06/17/2020 0924   TRIG 97 06/17/2020 0924   HDL 40 06/17/2020 0924   LDLCALC 90 06/17/2020 0924    Physical Exam:    VS:  BP 140/76 (BP Location: Left Arm, Patient Position: Sitting, Cuff Size: Large)   Pulse 74   Ht 5\' 6"  (1.676 m)   Wt 293 lb (132.9 kg)   SpO2 95%   BMI 47.29 kg/m     Wt Readings from Last 3 Encounters:  01/17/21 293 lb (132.9 kg)  12/30/20 297 lb (134.7 kg)  11/29/20 294 lb (133.4 kg)     GEN:  Well nourished, well  developed in no acute distress HEENT: Normal NECK: No JVD; No carotid bruits LYMPHATICS: No lymphadenopathy CARDIAC: RRR, no murmurs, rubs, gallops RESPIRATORY:  Clear to auscultation without rales, wheezing or rhonchi  ABDOMEN: Soft, non-tender, non-distended MUSCULOSKELETAL:  No edema; No deformity  SKIN: Warm and dry NEUROLOGIC:  Alert and oriented x 3 PSYCHIATRIC:  Normal affect   ASSESSMENT:    1. Essential hypertension   2. Morbid obesity ([place])    PLAN:    In order of problems listed above:  hypertension.  BP slightly elevated, usually controlled.  Continue Cardizem 180 mg daily.  Low-salt diet advised, Patient is morbidly obese.  Weight loss advised.  Follow-up as needed.    Total encounter time 35 minutes  Greater than 50% was spent in counseling and coordination of care with the patient Plan discussed with patient and daughter in the room.   This note was generated in part or whole with voice recognition software. Voice recognition is usually quite accurate but there are transcription errors that can and very often do occur. I apologize for any typographical errors that were not detected and corrected.  Medication Adjustments/Labs and Tests Ordered: Current medicines are reviewed at length with the patient today.  Concerns regarding medicines are outlined above.  Orders Placed This Encounter  Procedures   EKG 12-Lead    No orders of the defined types were placed in this encounter.   Patient Instructions  Medication Instructions:   Your physician recommends that you continue on your current medications as directed. Please refer to the Current Medication list given to you today.   *If you need a refill on your cardiac medications before your next appointment, please call your pharmacy*   Lab Work: None ordered If you have labs (blood work) drawn today and your tests are completely normal, you will receive your results only by: [place] (if you have  MyChart) OR A paper copy in the mail If you have any lab test that is abnormal or we need to change your treatment, we will call you to review the results.   Testing/Procedures: None ordered   Follow-Up: At [facility], you and your health needs are our priority.  As part of our continuing mission to provide you with exceptional heart care, we have created designated Provider Care Teams.  These Care Teams include your primary Cardiologist (physician) and Advanced Practice Providers (APPs -  Physician Assistants and Nurse Practitioners) who all work together to provide you with the care you need, when you need it.  We recommend signing up for the patient portal called "MyChart".  Sign up information is provided on this After Visit Summary.  MyChart is used to connect with patients for Virtual Visits (Telemedicine).  Patients are able to view lab/test results, encounter notes, upcoming appointments, etc.  Non-urgent messages can be sent to your provider as well.   To learn more about what you can do with MyChart, go to [email or web address].    Your next appointment:   Follow up as needed   The format for your next appointment:   In Person  Provider:   You may see Kate Sable, MD or one of the following Advanced Practice Providers on your designated Care Team:   Murray Hodgkins, NP Christell Faith, PA-C Cadence Kathlen Mody, [place]    Other Instructions    Signed, Kate Sable, MD  01/17/2021 11:20 AM    [place]

## 2021-01-17 NOTE — Patient Instructions (Signed)

## 2021-01-27 ENCOUNTER — Encounter: Payer: Self-pay | Admitting: Physician Assistant

## 2021-01-27 ENCOUNTER — Ambulatory Visit: Payer: BC Managed Care – PPO

## 2021-01-27 ENCOUNTER — Ambulatory Visit: Payer: BC Managed Care – PPO | Admitting: Physician Assistant

## 2021-01-27 ENCOUNTER — Other Ambulatory Visit: Payer: Self-pay

## 2021-01-27 DIAGNOSIS — J069 Acute upper respiratory infection, unspecified: Secondary | ICD-10-CM | POA: Diagnosis not present

## 2021-01-27 DIAGNOSIS — J029 Acute pharyngitis, unspecified: Secondary | ICD-10-CM | POA: Diagnosis not present

## 2021-01-27 LAB — POCT RAPID STREP A (OFFICE): Rapid Strep A Screen: NEGATIVE

## 2021-01-27 MED ORDER — AZITHROMYCIN 250 MG PO TABS: ORAL_TABLET | ORAL | 0 refills | Status: DC

## 2021-01-27 NOTE — Progress Notes (Signed)
[facility] [place], [place] [address]  Internal MEDICINE  Office Visit Note  Patient Name: Shelley [name]  [id number]  [id number]  Date of Service: 01/27/2021  Chief Complaint  Patient presents with   Acute Visit    cough, congestion, neg covid 01/23/21,b12 inj   Cough   Sinusitis   Sore Throat     HPI Pt is here for a sick visit. -She has been having cough, congestion, sinus pressure, sore throat and ear pain for over a week now. She was having some chills and body aches when it first started but this resolved.  -Her covid test is negative and strep done in office today is also negative -She reports her main concern now is the ongoing cough -She denies any SOB or wheezing  Current Medication:  Outpatient Encounter Medications as of 01/27/2021  Medication Sig   albuterol (VENTOLIN HFA) 108 (90 Base) MCG/ACT inhaler Inhale 2 puffs into the lungs every 6 (six) hours as needed for wheezing or shortness of breath.   ALPRAZolam (XANAX) 0.25 MG tablet Take half tab twice a day as needed for severe panic attacks   azelastine (ASTELIN) 0.1 % nasal spray Place 2 sprays into both nostrils 2 (two) times daily. Use in each nostril as directed   azithromycin (ZITHROMAX) 250 MG tablet Take one tab a day for 10 days for uri   diclofenac Sodium (VOLTAREN) 1 % GEL SMARTSIG:Gram(s) Topical 4 Times Daily PRN   diltiazem (CARDIZEM CD) 180 MG 24 hr capsule Take 1 capsule (180 mg total) by mouth daily.   escitalopram (LEXAPRO) 10 MG tablet TAKE 1 TABLET BY MOUTH ONCE DAILY WITH  SUPPER  FOR  PANIC  ATTACKS   meclizine (ANTIVERT) 25 MG tablet Take 25 mg by mouth 3 (three) times daily as needed.   meloxicam (MOBIC) 15 MG tablet Take 1 tablet (15 mg total) by mouth daily as needed.   metoCLOPramide (REGLAN) 5 MG tablet Take 1 tablet (5 mg total) by mouth daily.   montelukast (SINGULAIR) 10 MG tablet Take 1 tablet (10 mg total) by mouth at bedtime.   Multiple Vitamin  (MULTIVITAMIN) tablet Take 1 tablet by mouth daily.   omeprazole (PRILOSEC) 40 MG capsule TAKE 1 CAPSULE BY MOUTH ONCE DAILY FOR HEARTBURN   No facility-administered encounter medications on file as of 01/27/2021.      Medical History: Past Medical History:  Diagnosis Date   Anemia    Anxiety    Arthritis    Cardiomegaly    Family history of breast cancer    Family history of ovarian cancer    5/22 cancer genetic testing letter sent   Hypertension    Pulmonary hypertension ([place])    Sleep apnea    Thyroid disease    Vertigo      Vital Signs: BP (!) 158/87   Pulse 70   Temp 98.8 F (37.1 C)   Resp 16   Ht 5\' 6"  (1.676 m)   Wt 290 lb 12.8 oz (131.9 kg)   SpO2 98%   BMI 46.94 kg/m    Review of Systems  Constitutional:  Negative for fatigue and fever.  HENT:  Positive for congestion, ear pain, postnasal drip, sinus pressure and sore throat. Negative for mouth sores.   Respiratory:  Positive for cough. Negative for shortness of breath and wheezing.   Cardiovascular:  Negative for chest pain.  Genitourinary:  Negative for flank pain.  Psychiatric/Behavioral: Negative.     Physical  Exam Vitals and nursing note reviewed.  Constitutional:      General: She is not in acute distress.    Appearance: She is well-developed. She is obese. She is not diaphoretic.  HENT:     Head: Normocephalic and atraumatic.     Nose: Congestion present.     Mouth/Throat:     Pharynx: Posterior oropharyngeal erythema present. No oropharyngeal exudate.  Eyes:     Pupils: Pupils are equal, round, and reactive to light.  Neck:     Thyroid: No thyromegaly.     Vascular: No JVD.     Trachea: No tracheal deviation.  Cardiovascular:     Rate and Rhythm: Normal rate and regular rhythm.     Heart sounds: Normal heart sounds. No murmur heard.   No friction rub. No gallop.  Pulmonary:     Effort: Pulmonary effort is normal. No respiratory distress.     Breath sounds: No wheezing or rales.   Chest:     Chest wall: No tenderness.  Abdominal:     General: Bowel sounds are normal.     Palpations: Abdomen is soft.  Musculoskeletal:        General: Normal range of motion.     Cervical back: Normal range of motion and neck supple.  Lymphadenopathy:     Cervical: No cervical adenopathy.  Skin:    General: Skin is warm and dry.  Neurological:     Mental Status: She is alert and oriented to person, place, and time.     Cranial Nerves: No cranial nerve deficit.  Psychiatric:        Behavior: Behavior normal.        Thought Content: Thought content normal.        Judgment: Judgment normal.      Assessment/Plan: 1. Upper respiratory tract infection, unspecified type Will start on zpak and advised pt to take mucinex and nasal spray for congestion. She should rest and stay well hydrated. - azithromycin (ZITHROMAX) 250 MG tablet; Take one tab a day for 10 days for uri  Dispense: 10 tablet; Refill: 0  2. Sore throat - POCT rapid strep A is negative. Will treat URI with zpak and should try warm tea with honey or lozenges to help soothe throat.   General Counseling: Shadia verbalizes understanding of the findings of todays visit and agrees with plan of treatment. I have discussed any further diagnostic evaluation that may be needed or ordered today. We also reviewed her medications today. she has been encouraged to call the office with any questions or concerns that should arise related to todays visit.    Counseling:    Orders Placed This Encounter  Procedures   POCT rapid strep A    Meds ordered this encounter  Medications   azithromycin (ZITHROMAX) 250 MG tablet    Sig: Take one tab a day for 10 days for uri    Dispense:  10 tablet    Refill:  0    Time spent:25 Minutes

## 2021-02-04 ENCOUNTER — Ambulatory Visit (INDEPENDENT_AMBULATORY_CARE_PROVIDER_SITE_OTHER): Payer: BC Managed Care – PPO

## 2021-02-04 ENCOUNTER — Other Ambulatory Visit: Payer: Self-pay

## 2021-02-04 DIAGNOSIS — E538 Deficiency of other specified B group vitamins: Secondary | ICD-10-CM

## 2021-02-04 MED ORDER — CYANOCOBALAMIN 1000 MCG/ML IJ SOLN
1000.0000 ug | Freq: Once | INTRAMUSCULAR | Status: AC
Start: 2021-02-04 — End: 2021-02-04
  Administered 2021-02-04: 1000 ug via INTRAMUSCULAR

## 2021-02-11 ENCOUNTER — Other Ambulatory Visit: Payer: Self-pay

## 2021-02-11 ENCOUNTER — Ambulatory Visit (INDEPENDENT_AMBULATORY_CARE_PROVIDER_SITE_OTHER): Payer: BC Managed Care – PPO | Admitting: Gastroenterology

## 2021-02-11 ENCOUNTER — Encounter: Payer: Self-pay | Admitting: Gastroenterology

## 2021-02-11 VITALS — BP 133/74 | HR 82 | Ht 66.0 in | Wt 289.6 lb

## 2021-02-11 DIAGNOSIS — Z1211 Encounter for screening for malignant neoplasm of colon: Secondary | ICD-10-CM

## 2021-02-11 DIAGNOSIS — K219 Gastro-esophageal reflux disease without esophagitis: Secondary | ICD-10-CM | POA: Diagnosis not present

## 2021-02-11 DIAGNOSIS — R110 Nausea: Secondary | ICD-10-CM

## 2021-02-11 MED ORDER — CLENPIQ 10-3.5-12 MG-GM -GM/160ML PO SOLN: 320.0000 mL | ORAL | 0 refills | Status: DC

## 2021-02-11 NOTE — Progress Notes (Addendum)
Gastroenterology Consultation  Referring Provider:     Jules Husbands, MD Primary Care Physician:  Lavera Guise, MD Primary Gastroenterologist:  Dr. Allen Norris     Reason for Consultation:     GERD        HPI:   Shelley [name] is a 60 y.o. y/o female referred for consultation & management of GERD by Dr. Humphrey Rolls, Timoteo Gaul, MD.  This patient comes in today after being seen by surgery for possible antireflux surgery since the patient has had reflux which was thought to be contributing to her pulmonary issues.  The patient has a history of  hypertension and was thought to have reflux worsening her symptoms.  The patient also reports that she had a colonoscopy many years ago and from her records it appears that it may have been as far back as 2009. The patient also reports intermittent vomiting and when she vomited she says it is usually acid.  Past Medical History:  Diagnosis Date   Anemia    Anxiety    Arthritis    Cardiomegaly    Family history of breast cancer    Family history of ovarian cancer    5/22 cancer genetic testing letter sent   Hypertension    Pulmonary hypertension ([place])    Sleep apnea    Thyroid disease    Vertigo     Past Surgical History:  Procedure Laterality Date   RIGHT/LEFT HEART CATH AND CORONARY ANGIOGRAPHY Bilateral 05/26/2019   Procedure: RIGHT/LEFT HEART CATH AND CORONARY ANGIOGRAPHY;  Surgeon: Nelva Bush, MD;  Location: [facility];  Service: Cardiovascular;  Laterality: Bilateral;   TUBAL LIGATION      Prior to Admission medications   Medication Sig Start Date End Date Taking? Authorizing Provider  albuterol (VENTOLIN HFA) 108 (90 Base) MCG/ACT inhaler Inhale 2 puffs into the lungs every 6 (six) hours as needed for wheezing or shortness of breath. 03/19/20  Yes Lavera Guise, MD  ALPRAZolam Duanne Moron) 0.25 MG tablet Take half tab twice a day as needed for severe panic attacks 05/27/20  Yes Lavera Guise, MD  azelastine (ASTELIN) 0.1 % nasal  spray Place 2 sprays into both nostrils 2 (two) times daily. Use in each nostril as directed 09/05/20  Yes Lavera Guise, MD  diclofenac Sodium (VOLTAREN) 1 % GEL SMARTSIG:Gram(s) Topical 4 Times Daily PRN 12/12/20  Yes [provider]  escitalopram (LEXAPRO) 10 MG tablet TAKE 1 TABLET BY MOUTH ONCE DAILY WITH  SUPPER  FOR  PANIC  ATTACKS 10/04/20  Yes Lavera Guise, MD  meclizine (ANTIVERT) 25 MG tablet Take 25 mg by mouth 3 (three) times daily as needed. 05/23/20  Yes [provider]  meloxicam (MOBIC) 15 MG tablet Take 1 tablet (15 mg total) by mouth daily as needed. 11/29/20  Yes McDonough, Lauren K, PA-C  Multiple Vitamin (MULTIVITAMIN) tablet Take 1 tablet by mouth daily.   Yes [provider]  omeprazole (PRILOSEC) 40 MG capsule TAKE 1 CAPSULE BY MOUTH ONCE DAILY FOR HEARTBURN 01/07/21  Yes Abernathy, Alyssa, NP  Sod Picosulfate-Mag Ox-Cit Acd (CLENPIQ) 10-3.5-12 MG-GM -GM/160ML SOLN Take 320 mLs by mouth as directed. 02/11/21   Lucilla Lame, MD    Family History  Problem Relation Age of Onset   Heart attack Mother    Ovarian cancer Mother    Cancer Father    Cancer Sister    Breast cancer Maternal Grandmother    Breast cancer Cousin  1st maternal cousin   Cancer Other      Social History   Tobacco Use   Smoking status: Never   Smokeless tobacco: Never  Vaping Use   Vaping Use: Never used  Substance Use Topics   Alcohol use: No   Drug use: No    Allergies as of 02/11/2021 - Review Complete 02/11/2021  Allergen Reaction Noted   Amlodipine  05/23/2019    Review of Systems:    All systems reviewed and negative except where noted in HPI.   Physical Exam:  BP 133/74 (BP Location: Right Arm, Patient Position: Sitting, Cuff Size: Normal)   Pulse 82   Ht 5\' 6"  (1.676 m)   Wt 289 lb 9.6 oz (131.4 kg)   BMI 46.74 kg/m  No LMP recorded. (Menstrual status: IUD). General:   Alert,  Well-developed, well-nourished, pleasant and cooperative in  NAD Head:  Normocephalic and atraumatic. Eyes:  Sclera clear, no icterus.   Conjunctiva pink. Ears:  Normal auditory acuity. Neck:  Supple; no masses or thyromegaly. Lungs:  Respirations even and unlabored.  Clear throughout to auscultation.   No wheezes, crackles, or rhonchi. No acute distress. Heart:  Regular rate and rhythm; no murmurs, clicks, rubs, or gallops. Abdomen:  Normal bowel sounds.  No bruits.  Soft, non-tender and non-distended without masses, hepatosplenomegaly or hernias noted.  No guarding or rebound tenderness.  Negative Carnett sign.   Rectal:  Deferred.  Pulses:  Normal pulses noted. Extremities:  No clubbing or edema.  No cyanosis. Neurologic:  Alert and oriented x3;  grossly normal neurologically. Skin:  Intact without significant lesions or rashes.  No jaundice. Lymph Nodes:  No significant cervical adenopathy. Psych:  Alert and cooperative. Normal mood and affect.  Imaging Studies: No results found.  Assessment and Plan:   Shelley [name] is a 60 y.o. y/o female who comes today with a history of reflux with hypertension and considering antireflux surgery with a repair of her hiatal hernia.  The patient will be set up for an EGD and a colonoscopy because she is due for screening.  The patient has been explained the plan and agrees with it.    Lucilla Lame, MD. Marval Regal    Note: This dictation was prepared with Dragon dictation along with smaller phrase technology. Any transcriptional errors that result from this process are unintentional.

## 2021-02-11 NOTE — H&P (View-Only) (Signed)
Gastroenterology Consultation  Referring Provider:     Jules Husbands, MD Primary Care Physician:  Lavera Guise, MD Primary Gastroenterologist:  Dr. Allen Norris     Reason for Consultation:     GERD        HPI:   Shelley [name] is a 60 y.o. y/o female referred for consultation & management of GERD by Dr. Humphrey Rolls, Timoteo Gaul, MD.  This patient comes in today after being seen by surgery for possible antireflux surgery since the patient has had reflux which was thought to be contributing to her pulmonary issues.  The patient has a history of  hypertension and was thought to have reflux worsening her symptoms.  The patient also reports that she had a colonoscopy many years ago and from her records it appears that it may have been as far back as 2009. The patient also reports intermittent vomiting and when she vomited she says it is usually acid.  Past Medical History:  Diagnosis Date   Anemia    Anxiety    Arthritis    Cardiomegaly    Family history of breast cancer    Family history of ovarian cancer    5/22 cancer genetic testing letter sent   Hypertension    Pulmonary hypertension ([place])    Sleep apnea    Thyroid disease    Vertigo     Past Surgical History:  Procedure Laterality Date   RIGHT/LEFT HEART CATH AND CORONARY ANGIOGRAPHY Bilateral 05/26/2019   Procedure: RIGHT/LEFT HEART CATH AND CORONARY ANGIOGRAPHY;  Surgeon: Nelva Bush, MD;  Location: [facility];  Service: Cardiovascular;  Laterality: Bilateral;   TUBAL LIGATION      Prior to Admission medications   Medication Sig Start Date End Date Taking? Authorizing Provider  albuterol (VENTOLIN HFA) 108 (90 Base) MCG/ACT inhaler Inhale 2 puffs into the lungs every 6 (six) hours as needed for wheezing or shortness of breath. 03/19/20  Yes Lavera Guise, MD  ALPRAZolam Duanne Moron) 0.25 MG tablet Take half tab twice a day as needed for severe panic attacks 05/27/20  Yes Lavera Guise, MD  azelastine (ASTELIN)  0.1 % nasal spray Place 2 sprays into both nostrils 2 (two) times daily. Use in each nostril as directed 09/05/20  Yes Lavera Guise, MD  diclofenac Sodium (VOLTAREN) 1 % GEL SMARTSIG:Gram(s) Topical 4 Times Daily PRN 12/12/20  Yes [provider]  escitalopram (LEXAPRO) 10 MG tablet TAKE 1 TABLET BY MOUTH ONCE DAILY WITH  SUPPER  FOR  PANIC  ATTACKS 10/04/20  Yes Lavera Guise, MD  meclizine (ANTIVERT) 25 MG tablet Take 25 mg by mouth 3 (three) times daily as needed. 05/23/20  Yes [provider]  meloxicam (MOBIC) 15 MG tablet Take 1 tablet (15 mg total) by mouth daily as needed. 11/29/20  Yes McDonough, Lauren K, PA-C  Multiple Vitamin (MULTIVITAMIN) tablet Take 1 tablet by mouth daily.   Yes [provider]  omeprazole (PRILOSEC) 40 MG capsule TAKE 1 CAPSULE BY MOUTH ONCE DAILY FOR HEARTBURN 01/07/21  Yes Abernathy, Alyssa, NP  Sod Picosulfate-Mag Ox-Cit Acd (CLENPIQ) 10-3.5-12 MG-GM -GM/160ML SOLN Take 320 mLs by mouth as directed. 02/11/21   Lucilla Lame, MD    Family History  Problem Relation Age of Onset   Heart attack Mother    Ovarian cancer Mother    Cancer Father    Cancer Sister    Breast cancer Maternal Grandmother    Breast cancer Cousin  1st maternal cousin   Cancer Other      Social History   Tobacco Use   Smoking status: Never   Smokeless tobacco: Never  Vaping Use   Vaping Use: Never used  Substance Use Topics   Alcohol use: No   Drug use: No    Allergies as of 02/11/2021 - Review Complete 02/11/2021  Allergen Reaction Noted   Amlodipine  05/23/2019    Review of Systems:    All systems reviewed and negative except where noted in HPI.   Physical Exam:  BP 133/74 (BP Location: Right Arm, Patient Position: Sitting, Cuff Size: Normal)    Pulse 82    Ht 5\' 6"  (1.676 m)    Wt 289 lb 9.6 oz (131.4 kg)    BMI 46.74 kg/m  No LMP recorded. (Menstrual status: IUD). General:   Alert,  Well-developed, well-nourished, pleasant  and cooperative in NAD Head:  Normocephalic and atraumatic. Eyes:  Sclera clear, no icterus.   Conjunctiva pink. Ears:  Normal auditory acuity. Neck:  Supple; no masses or thyromegaly. Lungs:  Respirations even and unlabored.  Clear throughout to auscultation.   No wheezes, crackles, or rhonchi. No acute distress. Heart:  Regular rate and rhythm; no murmurs, clicks, rubs, or gallops. Abdomen:  Normal bowel sounds.  No bruits.  Soft, non-tender and non-distended without masses, hepatosplenomegaly or hernias noted.  No guarding or rebound tenderness.  Negative Carnett sign.   Rectal:  Deferred.  Pulses:  Normal pulses noted. Extremities:  No clubbing or edema.  No cyanosis. Neurologic:  Alert and oriented x3;  grossly normal neurologically. Skin:  Intact without significant lesions or rashes.  No jaundice. Lymph Nodes:  No significant cervical adenopathy. Psych:  Alert and cooperative. Normal mood and affect.  Imaging Studies: No results found.  Assessment and Plan:   Shelley [name] is a 60 y.o. y/o female who comes today with a history of reflux with hypertension and considering antireflux surgery with a repair of her hiatal hernia.  The patient will be set up for an EGD and a colonoscopy because she is due for screening.  The patient has been explained the plan and agrees with it.    Lucilla Lame, MD. Marval Regal    Note: This dictation was prepared with Dragon dictation along with smaller phrase technology. Any transcriptional errors that result from this process are unintentional.

## 2021-02-12 DIAGNOSIS — G4733 Obstructive sleep apnea (adult) (pediatric): Secondary | ICD-10-CM | POA: Diagnosis not present

## 2021-02-27 ENCOUNTER — Ambulatory Visit
Admission: RE | Admit: 2021-02-27 | Discharge: 2021-02-27 | Disposition: A | Discharge status: Home / Self Care | Payer: BC Managed Care – PPO | Attending: Gastroenterology | Admitting: Gastroenterology

## 2021-02-27 ENCOUNTER — Other Ambulatory Visit: Payer: Self-pay

## 2021-02-27 ENCOUNTER — Encounter: Payer: Self-pay | Admitting: Gastroenterology

## 2021-02-27 ENCOUNTER — Ambulatory Visit: Payer: BC Managed Care – PPO | Admitting: Registered Nurse

## 2021-02-27 ENCOUNTER — Encounter
Admission: RE | Disposition: A | Discharge status: Home / Self Care | Payer: Self-pay | Source: Home / Self Care | Attending: Gastroenterology

## 2021-02-27 DIAGNOSIS — K641 Second degree hemorrhoids: Secondary | ICD-10-CM | POA: Insufficient documentation

## 2021-02-27 DIAGNOSIS — K635 Polyp of colon: Secondary | ICD-10-CM | POA: Diagnosis not present

## 2021-02-27 DIAGNOSIS — Z79899 Other long term (current) drug therapy: Secondary | ICD-10-CM | POA: Insufficient documentation

## 2021-02-27 DIAGNOSIS — F419 Anxiety disorder, unspecified: Secondary | ICD-10-CM | POA: Diagnosis not present

## 2021-02-27 DIAGNOSIS — D123 Benign neoplasm of transverse colon: Secondary | ICD-10-CM | POA: Diagnosis not present

## 2021-02-27 DIAGNOSIS — K449 Diaphragmatic hernia without obstruction or gangrene: Secondary | ICD-10-CM | POA: Insufficient documentation

## 2021-02-27 DIAGNOSIS — I10 Essential (primary) hypertension: Secondary | ICD-10-CM | POA: Insufficient documentation

## 2021-02-27 DIAGNOSIS — G4730 Sleep apnea, unspecified: Secondary | ICD-10-CM | POA: Diagnosis not present

## 2021-02-27 DIAGNOSIS — I272 Pulmonary hypertension, unspecified: Secondary | ICD-10-CM | POA: Diagnosis not present

## 2021-02-27 DIAGNOSIS — K219 Gastro-esophageal reflux disease without esophagitis: Secondary | ICD-10-CM | POA: Diagnosis not present

## 2021-02-27 DIAGNOSIS — Z1211 Encounter for screening for malignant neoplasm of colon: Secondary | ICD-10-CM | POA: Diagnosis not present

## 2021-02-27 DIAGNOSIS — R110 Nausea: Secondary | ICD-10-CM

## 2021-02-27 DIAGNOSIS — G4733 Obstructive sleep apnea (adult) (pediatric): Secondary | ICD-10-CM | POA: Diagnosis not present

## 2021-02-27 HISTORY — DX: Thyrotoxicosis, unspecified without thyrotoxic crisis or storm: E05.90

## 2021-02-27 HISTORY — PX: ESOPHAGOGASTRODUODENOSCOPY: SHX5428

## 2021-02-27 HISTORY — PX: COLONOSCOPY WITH PROPOFOL: SHX5780

## 2021-02-27 SURGERY — COLONOSCOPY WITH PROPOFOL
Anesthesia: General

## 2021-02-27 MED ORDER — DEXMEDETOMIDINE (PRECEDEX) IN NS 20 MCG/5ML (4 MCG/ML) IV SYRINGE
PREFILLED_SYRINGE | INTRAVENOUS | Status: DC | PRN
  Administered 2021-02-27: 12 ug via INTRAVENOUS

## 2021-02-27 MED ORDER — GLYCOPYRROLATE 0.2 MG/ML IJ SOLN
INTRAMUSCULAR | Status: AC
  Filled 2021-02-27: qty 1

## 2021-02-27 MED ORDER — SODIUM CHLORIDE 0.9 % IV SOLN: INTRAVENOUS | Status: DC

## 2021-02-27 MED ORDER — GLYCOPYRROLATE 0.2 MG/ML IJ SOLN
INTRAMUSCULAR | Status: DC | PRN
  Administered 2021-02-27: .2 mg via INTRAVENOUS

## 2021-02-27 MED ORDER — LIDOCAINE HCL (PF) 2 % IJ SOLN
INTRAMUSCULAR | Status: AC
  Filled 2021-02-27: qty 5

## 2021-02-27 MED ORDER — DEXMEDETOMIDINE HCL IN NACL 200 MCG/50ML IV SOLN
INTRAVENOUS | Status: AC
  Filled 2021-02-27: qty 50

## 2021-02-27 MED ORDER — PROPOFOL 500 MG/50ML IV EMUL
INTRAVENOUS | Status: DC | PRN
  Administered 2021-02-27: 150 ug/kg/min via INTRAVENOUS

## 2021-02-27 MED ORDER — PROPOFOL 500 MG/50ML IV EMUL
INTRAVENOUS | Status: AC
  Filled 2021-02-27: qty 50

## 2021-02-27 MED ORDER — PROPOFOL 10 MG/ML IV BOLUS
INTRAVENOUS | Status: DC | PRN
  Administered 2021-02-27: 90 mg via INTRAVENOUS

## 2021-02-27 MED ORDER — LIDOCAINE HCL (CARDIAC) PF 100 MG/5ML IV SOSY
PREFILLED_SYRINGE | INTRAVENOUS | Status: DC | PRN
  Administered 2021-02-27: 40 mg via INTRAVENOUS

## 2021-02-27 NOTE — Anesthesia Procedure Notes (Signed)
Date/Time: 02/27/2021 9:10 AM Performed by: Doreen Salvage, CRNA Pre-anesthesia Checklist: Patient identified, Emergency Drugs available, Suction available and Patient being monitored Patient Re-evaluated:Patient Re-evaluated prior to induction Oxygen Delivery Method: Supernova nasal CPAP Induction Type: IV induction Dental Injury: Teeth and Oropharynx as per pre-operative assessment  Comments: Nasal cannula with etCO2 monitoring

## 2021-02-27 NOTE — Op Note (Signed)
[facility] Patient Name: Shelley [name] Procedure Date: 02/27/2021 8:58 AM MRN: [id number] Account #: [id number] Date of Birth: 11/26/1960 Admit Type: Outpatient Age: 60 Room: [facility] ENDO ROOM 4 Gender: Female Note Status: Finalized Instrument Name: Park Meo [id number] Procedure:             Colonoscopy Indications:           Screening for colorectal malignant neoplasm Providers:             Lucilla Lame MD, MD Referring MD:          Lavera Guise, MD (Referring MD) Medicines:             Propofol per Anesthesia Complications:         No immediate complications. Procedure:             Pre-Anesthesia Assessment:                        - Prior to the procedure, a History and Physical was                         performed, and patient medications and allergies were                         reviewed. The patient's tolerance of previous                         anesthesia was also reviewed. The risks and benefits                         of the procedure and the sedation options and risks                         were discussed with the patient. All questions were                         answered, and informed consent was obtained. Prior                         Anticoagulants: The patient has taken no previous                         anticoagulant or antiplatelet agents. ASA Grade                         Assessment: II - A patient with mild systemic disease.                         After reviewing the risks and benefits, the patient                         was deemed in satisfactory condition to undergo the                         procedure.                        After obtaining informed consent, the colonoscope was  passed under direct vision. Throughout the procedure,                         the patient's blood pressure, pulse, and oxygen                         saturations were monitored continuously. The                          Colonoscope was introduced through the anus and                         advanced to the the cecum, identified by appendiceal                         orifice and ileocecal valve. The colonoscopy was                         performed without difficulty. The patient tolerated                         the procedure well. The quality of the bowel                         preparation was excellent. Findings:      The perianal and digital rectal examinations were normal.      A 3 mm polyp was found in the cecum. The polyp was sessile. The polyp       was removed with a cold biopsy forceps. Resection and retrieval were       complete.      Two sessile polyps were found in the transverse colon. The polyps were 3       to 4 mm in size. These polyps were removed with a cold biopsy forceps.       Resection and retrieval were complete.      A 6 mm polyp was found in the transverse colon. The polyp was sessile.       The polyp was removed with a cold snare. Resection and retrieval were       complete.      Non-bleeding internal hemorrhoids were found during retroflexion. The       hemorrhoids were Grade II (internal hemorrhoids that prolapse but reduce       spontaneously). Impression:            - One 3 mm polyp in the cecum, removed with a cold                         biopsy forceps. Resected and retrieved.                        - Two 3 to 4 mm polyps in the transverse colon,                         removed with a cold biopsy forceps. Resected and                         retrieved.                        -  One 6 mm polyp in the transverse colon, removed with                         a cold snare. Resected and retrieved.                        - Non-bleeding internal hemorrhoids. Recommendation:        - Discharge patient to home.                        - Resume previous diet.                        - Continue present medications.                        - Await pathology results.                         - If the pathology report reveals adenomatous tissue,                         then repeat the colonoscopy for surveillance in 5                         years. Procedure Code(s):     --- Professional ---                        [id number], Colonoscopy, flexible; with removal of                         tumor(s), polyp(s), or other lesion(s) by snare                         technique                        45380, 46, Colonoscopy, flexible; with biopsy, single                         or multiple Diagnosis Code(s):     --- Professional ---                        Z12.11, Encounter for screening for malignant neoplasm                         of colon                        K63.5, Polyp of colon CPT copyright 2019 [employer]. All rights reserved. The codes documented in this report are preliminary and upon coder review may  be revised to meet current compliance requirements. Lucilla Lame MD, MD 02/27/2021 9:32:20 AM This report has been signed electronically. Number of Addenda: 0 Note Initiated On: 02/27/2021 8:58 AM Scope Withdrawal Time: 0 hours 10 minutes 6 seconds  Total Procedure Duration: 0 hours 11 minutes 58 seconds  Estimated Blood Loss:  Estimated blood loss: none.      [facility]

## 2021-02-27 NOTE — Op Note (Signed)
[facility] Gastroenterology Patient Name: Shelley [name] Procedure Date: 02/27/2021 8:50 AM MRN: [id number] Account #: [id number] Date of Birth: 04-06-1960 Admit Type: Inpatient Age: 60 Room: [facility] ENDO ROOM 4 Gender: Female Note Status: Finalized Instrument Name: Upper Endoscope [id number] Procedure:             Upper GI endoscopy Indications:           Heartburn Providers:             Lucilla Lame MD, MD Referring MD:          Lavera Guise, MD (Referring MD) Medicines:             Propofol per Anesthesia Complications:         No immediate complications. Procedure:             Pre-Anesthesia Assessment:                        - Prior to the procedure, a History and Physical was                         performed, and patient medications and allergies were                         reviewed. The patient's tolerance of previous                         anesthesia was also reviewed. The risks and benefits                         of the procedure and the sedation options and risks                         were discussed with the patient. All questions were                         answered, and informed consent was obtained. Prior                         Anticoagulants: The patient has taken no previous                         anticoagulant or antiplatelet agents. ASA Grade                         Assessment: II - A patient with mild systemic disease.                         After reviewing the risks and benefits, the patient                         was deemed in satisfactory condition to undergo the                         procedure.                        After obtaining informed consent, the endoscope was  passed under direct vision. Throughout the procedure,                         the patient's blood pressure, pulse, and oxygen                         saturations were monitored continuously. The                         Endosonoscope was  introduced through the mouth, and                         advanced to the second part of duodenum. The upper GI                         endoscopy was accomplished without difficulty. The                         patient tolerated the procedure well. Findings:      A small hiatal hernia was present.      The stomach was normal.      The examined duodenum was normal. Impression:            - Small hiatal hernia.                        - Normal stomach.                        - Normal examined duodenum.                        - No specimens collected. Recommendation:        - Discharge patient to home.                        - Resume previous diet.                        - Continue present medications.                        - Perform a colonoscopy today. Procedure Code(s):     --- Professional ---                        [id number], Esophagogastroduodenoscopy, flexible,                         transoral; diagnostic, including collection of                         specimen(s) by brushing or washing, when performed                         (separate procedure) Diagnosis Code(s):     --- Professional ---                        R12, Heartburn CPT copyright 2019 American Medical Association. All rights reserved. The codes documented in this report are preliminary and upon coder review may  be revised to meet current compliance requirements. Urban Naval  Tanecia Mccay MD, MD 02/27/2021 9:14:46 AM This report has been signed electronically. Number of Addenda: 0 Note Initiated On: 02/27/2021 8:50 AM Estimated Blood Loss:  Estimated blood loss: none.      [facility]

## 2021-02-27 NOTE — Anesthesia Postprocedure Evaluation (Signed)
Anesthesia Post Note  Patient: Shelley [name]  Procedure(s) Performed: COLONOSCOPY WITH PROPOFOL ESOPHAGOGASTRODUODENOSCOPY (EGD)  Patient location during evaluation: PACU Anesthesia Type: General Level of consciousness: awake and awake and alert Pain management: pain level controlled Vital Signs Assessment: post-procedure vital signs reviewed and stable Respiratory status: spontaneous breathing and respiratory function stable Cardiovascular status: stable Anesthetic complications: no   No notable events documented.   Last Vitals:  Vitals:   02/27/21 0930 02/27/21 0940  BP: 114/69 111/75  Pulse: 86 88  Resp: 18 (!) 25  Temp: (!) 36.2 C   SpO2: 99% 96%    Last Pain:  Vitals:   02/27/21 0930  TempSrc: Temporal  PainSc:                  VAN STAVEREN,Jeniah Kishi

## 2021-02-27 NOTE — Transfer of Care (Signed)
Immediate Anesthesia Transfer of Care Note  Patient: Shelley [name]  Procedure(s) Performed: COLONOSCOPY WITH PROPOFOL ESOPHAGOGASTRODUODENOSCOPY (EGD)  Patient Location: PACU and Endoscopy Unit  Anesthesia Type:General  Level of Consciousness: drowsy  Airway & Oxygen Therapy: Patient Spontanous Breathing  Post-op Assessment: Report given to RN and Post -op Vital signs reviewed and stable  Post vital signs: Reviewed and stable  Last Vitals:  Vitals Value Taken Time  BP 114/69 02/27/21 0934  Temp 36.2 C 02/27/21 0930  Pulse 89 02/27/21 0936  Resp 12 02/27/21 0936  SpO2 97 % 02/27/21 0936  Vitals shown include unvalidated device data.  Last Pain:  Vitals:   02/27/21 0930  TempSrc: Temporal  PainSc:          Complications: No notable events documented.

## 2021-02-27 NOTE — Anesthesia Postprocedure Evaluation (Signed)
Anesthesia Post Note  Patient: Shelley [name]  Procedure(s) Performed: COLONOSCOPY WITH PROPOFOL ESOPHAGOGASTRODUODENOSCOPY (EGD)  Anesthesia Type: General Anesthetic complications: no   No notable events documented.   Last Vitals:  Vitals:   02/27/21 0930 02/27/21 0940  BP: 114/69 111/75  Pulse: 86 88  Resp: 18 (!) 25  Temp: (!) 36.2 C   SpO2: 99% 96%    Last Pain:  Vitals:   02/27/21 0930  TempSrc: Temporal  PainSc:                  VAN STAVEREN,Walida Cajas

## 2021-02-27 NOTE — Interval H&P Note (Signed)
Lucilla Lame, MD [address] [name], [place] [address] [phone] Fax : [phone]  Primary Care Physician:  Lavera Guise, MD Primary Gastroenterologist:  Dr. Allen Norris  Pre-Procedure History & Physical: HPI:  Shelley [name] is a 60 y.o. female is here for an endoscopy and colonoscopy.   Past Medical History:  Diagnosis Date   Anemia    Anxiety    Arthritis    Cardiomegaly    Family history of breast cancer    Family history of ovarian cancer    5/22 cancer genetic testing letter sent   Hypertension    Hyperthyroidism    Pulmonary hypertension ([place])    Sleep apnea    Thyroid disease    Vertigo     Past Surgical History:  Procedure Laterality Date   RIGHT/LEFT HEART CATH AND CORONARY ANGIOGRAPHY Bilateral 05/26/2019   Procedure: RIGHT/LEFT HEART CATH AND CORONARY ANGIOGRAPHY;  Surgeon: Nelva Bush, MD;  Location: [place] [facility];  Service: Cardiovascular;  Laterality: Bilateral;   TUBAL LIGATION      Prior to Admission medications   Medication Sig Start Date End Date Taking? Authorizing Provider  ALPRAZolam Duanne Moron) 0.25 MG tablet Take half tab twice a day as needed for severe panic attacks 05/27/20  Yes Lavera Guise, MD  diclofenac Sodium (VOLTAREN) 1 % GEL SMARTSIG:Gram(s) Topical 4 Times Daily PRN 12/12/20  Yes [provider]  diltiazem (DILACOR XR) 180 MG 24 hr capsule Take 180 mg by mouth daily.   Yes [provider]  omeprazole (PRILOSEC) 40 MG capsule TAKE 1 CAPSULE BY MOUTH ONCE DAILY FOR HEARTBURN 01/07/21  Yes Abernathy, Alyssa, NP  Sod Picosulfate-Mag Ox-Cit Acd (CLENPIQ) 10-3.5-12 MG-GM -GM/160ML SOLN Take 320 mLs by mouth as directed. 02/11/21  Yes Lucilla Lame, MD  albuterol (VENTOLIN HFA) 108 (90 Base) MCG/ACT inhaler Inhale 2 puffs into the lungs every 6 (six) hours as needed for wheezing or shortness of breath. 03/19/20   Lavera Guise, MD  azelastine (ASTELIN) 0.1 % nasal spray Place 2 sprays into both  nostrils 2 (two) times daily. Use in each nostril as directed 09/05/20   Lavera Guise, MD  escitalopram (LEXAPRO) 10 MG tablet TAKE 1 TABLET BY MOUTH ONCE DAILY WITH  SUPPER  FOR  PANIC  ATTACKS 10/04/20   Lavera Guise, MD  meclizine (ANTIVERT) 25 MG tablet Take 25 mg by mouth 3 (three) times daily as needed. 05/23/20   [provider]  meloxicam (MOBIC) 15 MG tablet Take 1 tablet (15 mg total) by mouth daily as needed. 11/29/20   McDonough, Si Gaul, PA-C  Multiple Vitamin (MULTIVITAMIN) tablet Take 1 tablet by mouth daily.    [provider]    Allergies as of 02/12/2021 - Review Complete 02/11/2021  Allergen Reaction Noted   Amlodipine  05/23/2019    Family History  Problem Relation Age of Onset   Heart attack Mother    Ovarian cancer Mother    Cancer Father    Cancer Sister    Breast cancer Maternal Grandmother    Breast cancer Cousin        1st maternal cousin   Cancer Other     Social History   Socioeconomic History   Marital status: Single    Spouse name: Not on file   Number of children: Not on file   Years of education: Not on file   Highest education level: Not on file  Occupational History   Not on file  Tobacco Use   Smoking status: Never   Smokeless tobacco: Never  Vaping Use   Vaping Use: Never used  Substance and Sexual Activity   Alcohol use: No   Drug use: No   Sexual activity: Yes    Birth control/protection: I.U.D.  Other Topics Concern   Not on file  Social History Narrative   Not on file   Social Determinants of Health   Financial Resource Strain: Not on file  Food Insecurity: Not on file  Transportation Needs: Not on file  Physical Activity: Not on file  Stress: Not on file  Social Connections: Not on file  Intimate Partner Violence: Not on file    Review of Systems: See HPI, otherwise negative ROS  Physical Exam: BP (!) 148/95    Pulse 88    Temp (!) 97.1 F (36.2 C) (Temporal)    Resp 18    Ht 5\' 6"  (1.676 m)     Wt 131.1 kg    SpO2 99%    BMI 46.65 kg/m  General:   Alert,  pleasant and cooperative in NAD Head:  Normocephalic and atraumatic. Neck:  Supple; no masses or thyromegaly. Lungs:  Clear throughout to auscultation.    Heart:  Regular rate and rhythm. Abdomen:  Soft, nontender and nondistended. Normal bowel sounds, without guarding, and without rebound.   Neurologic:  Alert and  oriented x4;  grossly normal neurologically.  Impression/Plan: Shelley [name] is here for an endoscopy and colonoscopy to be performed for GERD and Screening  Risks, benefits, limitations, and alternatives regarding  endoscopy and colonoscopy have been reviewed with the patient.  Questions have been answered.  All parties agreeable.   Lucilla Lame, MD  02/27/2021, 8:54 AM

## 2021-02-27 NOTE — Anesthesia Preprocedure Evaluation (Signed)
Anesthesia Evaluation  Patient identified by MRN, date of birth, ID band Patient awake    Airway Mallampati: III     Mouth opening: Limited Mouth Opening  Dental  (+) Upper Dentures, Poor Dentition   Pulmonary shortness of breath and with exertion, sleep apnea and Continuous Positive Airway Pressure Ventilation ,     + decreased breath sounds      Cardiovascular Exercise Tolerance: Poor hypertension, Pt. on medications pulmonary hypertension Rhythm:Regular Rate:Normal     Neuro/Psych Anxiety negative neurological ROS     GI/Hepatic negative GI ROS, Neg liver ROS,   Endo/Other  Hypothyroidism   Renal/GU negative Renal ROS  negative genitourinary   Musculoskeletal  (+) Arthritis ,   Abdominal Normal abdominal exam  (+)   Peds negative pediatric ROS (+)  Hematology  (+) anemia ,   Anesthesia Other Findings   Reproductive/Obstetrics                             Anesthesia Physical Anesthesia Plan  ASA: 3  Anesthesia Plan: General   Post-op Pain Management:    Induction: Intravenous  PONV Risk Score and Plan:   Airway Management Planned: Natural Airway and Nasal Cannula  Additional Equipment:   Intra-op Plan:   Post-operative Plan:   Informed Consent: I have reviewed the patients History and Physical, chart, labs and discussed the procedure including the risks, benefits and alternatives for the proposed anesthesia with the patient or authorized representative who has indicated his/her understanding and acceptance.       Plan Discussed with: CRNA and Surgeon  Anesthesia Plan Comments:         Anesthesia Quick Evaluation

## 2021-02-28 ENCOUNTER — Encounter: Payer: Self-pay | Admitting: Gastroenterology

## 2021-02-28 ENCOUNTER — Ambulatory Visit: Payer: BC Managed Care – PPO | Admitting: Physician Assistant

## 2021-02-28 ENCOUNTER — Encounter: Payer: Self-pay | Admitting: Physician Assistant

## 2021-02-28 DIAGNOSIS — I10 Essential (primary) hypertension: Secondary | ICD-10-CM

## 2021-02-28 DIAGNOSIS — Z6841 Body Mass Index (BMI) 40.0 and over, adult: Secondary | ICD-10-CM | POA: Diagnosis not present

## 2021-02-28 DIAGNOSIS — K449 Diaphragmatic hernia without obstruction or gangrene: Secondary | ICD-10-CM

## 2021-02-28 DIAGNOSIS — M150 Primary generalized (osteo)arthritis: Secondary | ICD-10-CM | POA: Diagnosis not present

## 2021-02-28 DIAGNOSIS — K219 Gastro-esophageal reflux disease without esophagitis: Secondary | ICD-10-CM

## 2021-02-28 DIAGNOSIS — E538 Deficiency of other specified B group vitamins: Secondary | ICD-10-CM

## 2021-02-28 DIAGNOSIS — G4733 Obstructive sleep apnea (adult) (pediatric): Secondary | ICD-10-CM

## 2021-02-28 DIAGNOSIS — F411 Generalized anxiety disorder: Secondary | ICD-10-CM

## 2021-02-28 LAB — SURGICAL PATHOLOGY

## 2021-02-28 MED ORDER — OMEPRAZOLE 40 MG PO CPDR: DELAYED_RELEASE_CAPSULE | ORAL | 1 refills | Status: DC

## 2021-02-28 MED ORDER — ALPRAZOLAM 0.25 MG PO TABS: ORAL_TABLET | ORAL | 0 refills | Status: DC

## 2021-02-28 MED ORDER — MELOXICAM 15 MG PO TABS: 15.0000 mg | ORAL_TABLET | Freq: Every day | ORAL | 1 refills | Status: DC | PRN

## 2021-02-28 MED ORDER — ESCITALOPRAM OXALATE 10 MG PO TABS: ORAL_TABLET | ORAL | 4 refills | Status: DC

## 2021-02-28 MED ORDER — CYANOCOBALAMIN 1000 MCG/ML IJ SOLN
1000.0000 ug | Freq: Once | INTRAMUSCULAR | Status: AC
Start: 2021-02-28 — End: 2021-02-28
  Administered 2021-02-28: 14:00:00 1000 ug via INTRAMUSCULAR

## 2021-02-28 NOTE — Progress Notes (Signed)
[facility] [place], [place] [address]  Internal MEDICINE  Office Visit Note  Patient Name: Shelley [name]  [id number]  [id number]  Date of Service: 03/10/2021  Chief Complaint  Patient presents with   Follow-up   Hypertension   Anxiety   Arthritis   Anemia   B12 Injection    Pt would like to know if she can get her B12 injection today instead of the 29th    HPI Pt is here for routine follow up -Had her colonoscopy and endoscopy yesterday and say they went well. Small hiatal hernia seen again and was advised to continue current medications. Omeprazole is helping reflux. States they did find a few polyps on colonoscopy and are awaiting pathology results. -In need of some refills today, including Xanax which she only uses as needed and has not been filled in months. -Now that testing is done she is intereseted in weight loss program but does not want to take any pills for weight loss. Discussed using app tracker for calories and exercising and will obtain a metabolic test next visit. -She is scheduled for a B12 injection next week and would like to proceed with this today while in office already -Has started on cpap and doing ok with it. Has sleep clinic visit in 1 month with pulm f/u the month after.  Current Medication: Outpatient Encounter Medications as of 02/28/2021  Medication Sig   albuterol (VENTOLIN HFA) 108 (90 Base) MCG/ACT inhaler Inhale 2 puffs into the lungs every 6 (six) hours as needed for wheezing or shortness of breath.   azelastine (ASTELIN) 0.1 % nasal spray Place 2 sprays into both nostrils 2 (two) times daily. Use in each nostril as directed   diclofenac Sodium (VOLTAREN) 1 % GEL SMARTSIG:Gram(s) Topical 4 Times Daily PRN   diltiazem (DILACOR XR) 180 MG 24 hr capsule Take 180 mg by mouth daily.   meclizine (ANTIVERT) 25 MG tablet Take 25 mg by mouth 3 (three) times daily as needed.   Multiple Vitamin (MULTIVITAMIN) tablet Take 1  tablet by mouth daily.   Sod Picosulfate-Mag Ox-Cit Acd (CLENPIQ) 10-3.5-12 MG-GM -GM/160ML SOLN Take 320 mLs by mouth as directed.   [DISCONTINUED] ALPRAZolam (XANAX) 0.25 MG tablet Take half tab twice a day as needed for severe panic attacks   [DISCONTINUED] escitalopram (LEXAPRO) 10 MG tablet TAKE 1 TABLET BY MOUTH ONCE DAILY WITH  SUPPER  FOR  PANIC  ATTACKS   [DISCONTINUED] meloxicam (MOBIC) 15 MG tablet Take 1 tablet (15 mg total) by mouth daily as needed.   [DISCONTINUED] omeprazole (PRILOSEC) 40 MG capsule TAKE 1 CAPSULE BY MOUTH ONCE DAILY FOR HEARTBURN   ALPRAZolam (XANAX) 0.25 MG tablet Take half tab twice a day as needed for severe panic attacks   escitalopram (LEXAPRO) 10 MG tablet TAKE 1 TABLET BY MOUTH ONCE DAILY WITH  SUPPER  FOR  PANIC  ATTACKS   meloxicam (MOBIC) 15 MG tablet Take 1 tablet (15 mg total) by mouth daily as needed.   omeprazole (PRILOSEC) 40 MG capsule TAKE 1 CAPSULE BY MOUTH ONCE DAILY FOR HEARTBURN   [DISCONTINUED] omeprazole (PRILOSEC) 40 MG capsule TAKE 1 CAPSULE BY MOUTH ONCE DAILY FOR HEARTBURN   [EXPIRED] cyanocobalamin ((VITAMIN B-12)) injection 1,000 mcg    No facility-administered encounter medications on file as of 02/28/2021.    Surgical History: Past Surgical History:  Procedure Laterality Date   COLONOSCOPY WITH PROPOFOL N/A 02/27/2021   Procedure: COLONOSCOPY WITH PROPOFOL;  Surgeon: Lucilla Lame, MD;  Location: [facility] ENDOSCOPY;  Service: Endoscopy;  Laterality: N/A;   ESOPHAGOGASTRODUODENOSCOPY  02/27/2021   Procedure: ESOPHAGOGASTRODUODENOSCOPY (EGD);  Surgeon: Lucilla Lame, MD;  Location: [facility] ENDOSCOPY;  Service: Endoscopy;;   RIGHT/LEFT HEART CATH AND CORONARY ANGIOGRAPHY Bilateral 05/26/2019   Procedure: RIGHT/LEFT HEART CATH AND CORONARY ANGIOGRAPHY;  Surgeon: Nelva Bush, MD;  Location: [place] [facility];  Service: Cardiovascular;  Laterality: Bilateral;   TUBAL LIGATION      Medical History: Past Medical History:  Diagnosis  Date   Anemia    Anxiety    Arthritis    Cardiomegaly    Family history of breast cancer    Family history of ovarian cancer    5/22 cancer genetic testing letter sent   Hypertension    Hyperthyroidism    Pulmonary hypertension ([place])    Sleep apnea    Thyroid disease    Vertigo     Family History: Family History  Problem Relation Age of Onset   Heart attack Mother    Ovarian cancer Mother    Cancer Father    Cancer Sister    Breast cancer Maternal Grandmother    Breast cancer Cousin        1st maternal cousin   Cancer Other     Social History   Socioeconomic History   Marital status: Single    Spouse name: Not on file   Number of children: Not on file   Years of education: Not on file   Highest education level: Not on file  Occupational History   Not on file  Tobacco Use   Smoking status: Never   Smokeless tobacco: Never  Vaping Use   Vaping Use: Never used  Substance and Sexual Activity   Alcohol use: No   Drug use: No   Sexual activity: Yes    Birth control/protection: I.U.D.  Other Topics Concern   Not on file  Social History Narrative   Not on file   Social Determinants of Health   Financial Resource Strain: Not on file  Food Insecurity: Not on file  Transportation Needs: Not on file  Physical Activity: Not on file  Stress: Not on file  Social Connections: Not on file  Intimate Partner Violence: Not on file      Review of Systems  Constitutional:  Negative for chills, fatigue and unexpected weight change.  HENT:  Positive for postnasal drip. Negative for congestion, rhinorrhea, sneezing and sore throat.   Eyes:  Negative for redness.  Respiratory:  Negative for cough, chest tightness and shortness of breath.   Cardiovascular:  Negative for chest pain and palpitations.  Gastrointestinal:  Negative for abdominal pain, constipation, diarrhea, nausea and vomiting.       GERD  Genitourinary:  Negative for dysuria and frequency.   Musculoskeletal:  Negative for arthralgias, back pain, joint swelling and neck pain.  Skin:  Negative for rash.  Neurological: Negative.  Negative for tremors and numbness.  Hematological:  Negative for adenopathy. Does not bruise/bleed easily.  Psychiatric/Behavioral:  Negative for behavioral problems (Depression), sleep disturbance and suicidal ideas. The patient is not nervous/anxious.    Vital Signs: BP 140/69    Pulse 68    Temp 98.7 F (37.1 C)    Resp 16    Ht 5\' 6"  (1.676 m)    Wt 291 lb 9.6 oz (132.3 kg)    SpO2 98%    BMI 47.07 kg/m    Physical Exam Vitals and nursing note reviewed.  Constitutional:  General: She is not in acute distress.    Appearance: She is well-developed. She is obese. She is not diaphoretic.  HENT:     Head: Normocephalic and atraumatic.     Mouth/Throat:     Pharynx: No oropharyngeal exudate.  Eyes:     Pupils: Pupils are equal, round, and reactive to light.  Neck:     Thyroid: No thyromegaly.     Vascular: No JVD.     Trachea: No tracheal deviation.  Cardiovascular:     Rate and Rhythm: Normal rate and regular rhythm.     Heart sounds: Normal heart sounds. No murmur heard.   No friction rub. No gallop.  Pulmonary:     Effort: Pulmonary effort is normal. No respiratory distress.     Breath sounds: No wheezing or rales.  Chest:     Chest wall: No tenderness.  Abdominal:     General: Bowel sounds are normal.     Palpations: Abdomen is soft.  Musculoskeletal:        General: Normal range of motion.     Cervical back: Normal range of motion and neck supple.  Lymphadenopathy:     Cervical: No cervical adenopathy.  Skin:    General: Skin is warm and dry.  Neurological:     Mental Status: She is alert and oriented to person, place, and time.     Cranial Nerves: No cranial nerve deficit.  Psychiatric:        Behavior: Behavior normal.        Thought Content: Thought content normal.        Judgment: Judgment normal.        Assessment/Plan: 1. Essential hypertension Stable, continue current medications  2. Primary generalized (osteo)arthritis May take mobic as needed - meloxicam (MOBIC) 15 MG tablet; Take 1 tablet (15 mg total) by mouth daily as needed.  Dispense: 90 tablet; Refill: 1  3. Gastroesophageal reflux disease without esophagitis Continue omeprazole and follow up with GI - omeprazole (PRILOSEC) 40 MG capsule; TAKE 1 CAPSULE BY MOUTH ONCE DAILY FOR HEARTBURN  Dispense: 90 capsule; Refill: 1  4. GAD (generalized anxiety disorder) May take xanax as needed - ALPRAZolam (XANAX) 0.25 MG tablet; Take half tab twice a day as needed for severe panic attacks  Dispense: 30 tablet; Refill: 0  5. OSA (obstructive sleep apnea) Continue cpap and follow up with pulmonology  6. Hiatal hernia Followed by GI without current plan for surgery  7. B12 deficiency - cyanocobalamin ((VITAMIN B-12)) injection 1,000 mcg  8. Morbid obesity with BMI of 45.0-49.9, adult ([place]) Will plan for metabolic test next visit Obesity Counseling: Had a lengthy discussion regarding patients BMI and weight issues. Patient was instructed on portion control as well as increased activity. Also discussed caloric restrictions with trying to maintain intake less than 2000 Kcal. Discussions were made in accordance with the 5As of weight management. Simple actions such as not eating late and if able to, taking a walk is suggested.    General Counseling: Taralee verbalizes understanding of the findings of todays visit and agrees with plan of treatment. I have discussed any further diagnostic evaluation that may be needed or ordered today. We also reviewed her medications today. she has been encouraged to call the office with any questions or concerns that should arise related to todays visit.    No orders of the defined types were placed in this encounter.   Meds ordered this encounter  Medications   escitalopram (LEXAPRO) 10  MG tablet    Sig: TAKE 1 TABLET BY MOUTH ONCE DAILY WITH  SUPPER  FOR  PANIC  ATTACKS    Dispense:  30 tablet    Refill:  4   meloxicam (MOBIC) 15 MG tablet    Sig: Take 1 tablet (15 mg total) by mouth daily as needed.    Dispense:  90 tablet    Refill:  1   DISCONTD: omeprazole (PRILOSEC) 40 MG capsule    Sig: TAKE 1 CAPSULE BY MOUTH ONCE DAILY FOR HEARTBURN    Dispense:  90 capsule    Refill:  1    ZERO refills remain on this prescription. Your patient is requesting advance approval of refills for this medication to PREVENT ANY MISSED DOSES   ALPRAZolam (XANAX) 0.25 MG tablet    Sig: Take half tab twice a day as needed for severe panic attacks    Dispense:  30 tablet    Refill:  0   cyanocobalamin ((VITAMIN B-12)) injection 1,000 mcg   omeprazole (PRILOSEC) 40 MG capsule    Sig: TAKE 1 CAPSULE BY MOUTH ONCE DAILY FOR HEARTBURN    Dispense:  90 capsule    Refill:  1    This patient was seen by Drema Dallas, PA-C in collaboration with Dr. Clayborn Bigness as a part of collaborative care agreement.   Total time spent:30 Minutes Time spent includes review of chart, medications, test results, and follow up plan with the patient.      Dr Lavera Guise Internal medicine

## 2021-03-06 ENCOUNTER — Ambulatory Visit: Payer: BC Managed Care – PPO

## 2021-03-15 DIAGNOSIS — G4733 Obstructive sleep apnea (adult) (pediatric): Secondary | ICD-10-CM | POA: Diagnosis not present

## 2021-03-17 ENCOUNTER — Telehealth: Payer: Self-pay

## 2021-03-17 ENCOUNTER — Other Ambulatory Visit: Payer: Self-pay

## 2021-03-17 DIAGNOSIS — M153 Secondary multiple arthritis: Secondary | ICD-10-CM | POA: Diagnosis not present

## 2021-03-17 DIAGNOSIS — K219 Gastro-esophageal reflux disease without esophagitis: Secondary | ICD-10-CM

## 2021-03-17 DIAGNOSIS — I10 Essential (primary) hypertension: Secondary | ICD-10-CM | POA: Diagnosis not present

## 2021-03-17 DIAGNOSIS — E040 Nontoxic diffuse goiter: Secondary | ICD-10-CM | POA: Diagnosis not present

## 2021-03-17 DIAGNOSIS — E039 Hypothyroidism, unspecified: Secondary | ICD-10-CM | POA: Diagnosis not present

## 2021-03-17 DIAGNOSIS — E042 Nontoxic multinodular goiter: Secondary | ICD-10-CM | POA: Diagnosis not present

## 2021-03-17 DIAGNOSIS — Z6841 Body Mass Index (BMI) 40.0 and over, adult: Secondary | ICD-10-CM | POA: Diagnosis not present

## 2021-03-17 DIAGNOSIS — E6609 Other obesity due to excess calories: Secondary | ICD-10-CM | POA: Diagnosis not present

## 2021-03-17 MED ORDER — OMEPRAZOLE 40 MG PO CPDR: DELAYED_RELEASE_CAPSULE | ORAL | 1 refills | Status: DC

## 2021-03-17 NOTE — Telephone Encounter (Signed)
Lmom to call us regarding her med refills

## 2021-03-26 ENCOUNTER — Ambulatory Visit (INDEPENDENT_AMBULATORY_CARE_PROVIDER_SITE_OTHER): Payer: BC Managed Care – PPO

## 2021-03-26 ENCOUNTER — Other Ambulatory Visit: Payer: Self-pay

## 2021-03-26 ENCOUNTER — Ambulatory Visit: Payer: BC Managed Care – PPO

## 2021-03-26 DIAGNOSIS — E538 Deficiency of other specified B group vitamins: Secondary | ICD-10-CM | POA: Diagnosis not present

## 2021-03-26 DIAGNOSIS — G4733 Obstructive sleep apnea (adult) (pediatric): Secondary | ICD-10-CM | POA: Diagnosis not present

## 2021-03-26 MED ORDER — CYANOCOBALAMIN 1000 MCG/ML IJ SOLN
1000.0000 ug | Freq: Once | INTRAMUSCULAR | Status: AC
  Administered 2021-03-26: 1000 ug via INTRAMUSCULAR

## 2021-03-26 NOTE — Progress Notes (Signed)
95 percentile pressure 7   95th percentile leak 20   apnea index 0.1 /hr  apnea-hypopnea index  0.2 /hr   total days used  >4 hr 29 days  total days used <4 hr 0 days  Total compliance 100 percent  She is doing great, no problems or questions at this time  Pt was seen by Claiborne Billings  RRT/RCP  from [facility]

## 2021-03-28 ENCOUNTER — Telehealth: Payer: Self-pay

## 2021-03-28 NOTE — Telephone Encounter (Signed)
-----   Message from Edd Arbour, [place] sent at 03/27/2021  9:25 AM EST ----- Regarding: RE: omeprazole PA Yes I will be working on the PA for her  ----- Message ----- From: Jimmye Norman, [place]: 03/26/2021   3:17 PM EST To: Edd Arbour, CMA Subject: omeprazole PA                                  Pt's dose changed to 1 cap twice a day but insurance only covers 1 a day. PA was sent. Are you working on this already? Pt is currently out of prescription and is buying OTC but she really wants to have her prescription because there are only 10 in a bottle OTC and it doesn't last long.

## 2021-03-28 NOTE — Telephone Encounter (Signed)
Spoke to pt, informed her the PA is being worked on and she can check the status on Monday

## 2021-03-30 ENCOUNTER — Telehealth: Payer: Self-pay

## 2021-03-30 DIAGNOSIS — K219 Gastro-esophageal reflux disease without esophagitis: Secondary | ICD-10-CM

## 2021-03-30 MED ORDER — OMEPRAZOLE 40 MG PO CPDR: DELAYED_RELEASE_CAPSULE | ORAL | 1 refills | Status: DC

## 2021-03-30 NOTE — Telephone Encounter (Signed)
PA for OMEPRAZOLE 40 mg was approved valid from 03/30/21 to 03/30/2022 new rx sent to pharmacy

## 2021-03-30 NOTE — Telephone Encounter (Signed)
PA for OMEPRAZOLE 40 MG sent 03/30/21 @ 425pm

## 2021-03-31 ENCOUNTER — Ambulatory Visit: Payer: BC Managed Care – PPO | Admitting: Surgery

## 2021-04-04 DIAGNOSIS — E042 Nontoxic multinodular goiter: Secondary | ICD-10-CM | POA: Diagnosis not present

## 2021-04-04 DIAGNOSIS — E039 Hypothyroidism, unspecified: Secondary | ICD-10-CM | POA: Diagnosis not present

## 2021-04-04 DIAGNOSIS — I10 Essential (primary) hypertension: Secondary | ICD-10-CM | POA: Diagnosis not present

## 2021-04-04 DIAGNOSIS — Z6841 Body Mass Index (BMI) 40.0 and over, adult: Secondary | ICD-10-CM | POA: Diagnosis not present

## 2021-04-04 DIAGNOSIS — E6609 Other obesity due to excess calories: Secondary | ICD-10-CM | POA: Diagnosis not present

## 2021-04-07 ENCOUNTER — Ambulatory Visit: Payer: BC Managed Care – PPO | Admitting: Surgery

## 2021-04-07 ENCOUNTER — Other Ambulatory Visit: Payer: Self-pay

## 2021-04-07 ENCOUNTER — Encounter: Payer: Self-pay | Admitting: Surgery

## 2021-04-07 VITALS — BP 146/84 | HR 89 | Temp 98.0°F | Ht 66.0 in | Wt 289.2 lb

## 2021-04-07 DIAGNOSIS — K219 Gastro-esophageal reflux disease without esophagitis: Secondary | ICD-10-CM | POA: Diagnosis not present

## 2021-04-07 DIAGNOSIS — K449 Diaphragmatic hernia without obstruction or gangrene: Secondary | ICD-10-CM | POA: Diagnosis not present

## 2021-04-07 NOTE — Progress Notes (Signed)
Outpatient Surgical Follow Up  04/07/2021  Shelley [name] is an 61 y.o. female.   Chief Complaint  Patient presents with   Follow-up    Hiatal hernia. Endo/Colon 02/27/21    HPI: Shelley [name] is a 61 y.o. female following for [facility] and GERD. 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  She reports significant heartburn especially when she lays on her back.  Seems to be worsening at night and also endorses chronic cough.  She does not have any dysphagia.   She has lost 8 lbs since our last visit. Marland Kitchen No fevers no chills.  She did have a swallow evaluation already showing evidence of hiatal hernia and significant reflux. Please note that have personally reviewed the images. Also pers reviewed the EGD showing small HH  She is motivated to continue lose weight.  Past Medical History:  Diagnosis Date   Anemia    Anxiety    Arthritis    Cardiomegaly    Family history of breast cancer    Family history of ovarian cancer    5/22 cancer genetic testing letter sent   Hypertension    Hyperthyroidism    Pulmonary hypertension ([place])    Sleep apnea    Thyroid disease    Vertigo     Past Surgical History:  Procedure Laterality Date   COLONOSCOPY WITH PROPOFOL N/A 02/27/2021   Procedure: COLONOSCOPY WITH PROPOFOL;  Surgeon: Lucilla Lame, MD;  Location: [facility] ENDOSCOPY;  Service: Endoscopy;  Laterality: N/A;   ESOPHAGOGASTRODUODENOSCOPY  02/27/2021   Procedure: ESOPHAGOGASTRODUODENOSCOPY (EGD);  Surgeon: Lucilla Lame, MD;  Location: [facility] ENDOSCOPY;  Service: Endoscopy;;   RIGHT/LEFT HEART CATH AND CORONARY ANGIOGRAPHY Bilateral 05/26/2019   Procedure: RIGHT/LEFT HEART CATH AND CORONARY ANGIOGRAPHY;  Surgeon: Nelva [place], MD;  Location: [place] [facility];  Service: Cardiovascular;  Laterality: Bilateral;   TUBAL LIGATION      Family History  Problem Relation Age of Onset   Heart attack Mother    Ovarian cancer Mother    Cancer Father    Cancer  Sister    Breast cancer Maternal Grandmother    Breast cancer Cousin        1st maternal cousin   Cancer Other     Social History:  reports that she has never smoked. She has never used smokeless tobacco. She reports that she does not drink alcohol and does not use drugs.  Allergies:  Allergies  Allergen Reactions   Amlodipine     Medications reviewed.    ROS Full ROS performed and is otherwise negative other than what is stated in HPI   BP (!) 146/84    Pulse 89    Temp 98 F (36.7 C) (Oral)    Ht 5\' 6"  (1.676 m)    Wt 289 lb 3.2 oz (131.2 kg)    BMI 46.68 kg/m   Physical Exam Vitals and nursing note reviewed. Exam conducted with a chaperone present.  Constitutional:      General: She is not in acute distress.    Appearance: Normal appearance. She is obese. She is not ill-appearing.  Eyes:     General: No scleral icterus.       Right eye: No discharge.        Left eye: No discharge.  Cardiovascular:     Rate and Rhythm: Normal rate and regular rhythm.  Pulmonary:     Effort: Pulmonary effort is normal. No respiratory distress.  Breath sounds: Normal breath sounds. No stridor.  Abdominal:     General: Abdomen is flat. There is no distension.     Palpations: Abdomen is soft. There is no mass.     Tenderness: There is no abdominal tenderness. There is no guarding or rebound.     Hernia: No hernia is present.  Musculoskeletal:        General: Normal range of motion.     Cervical back: Normal range of motion and neck supple. No rigidity or tenderness.  Skin:    General: Skin is warm and dry.     Capillary Refill: Capillary refill takes less than 2 seconds.  Neurological:     General: No focal deficit present.     Mental Status: She is alert and oriented to person, place, and time.  Psychiatric:        Mood and Affect: Mood normal.        Behavior: Behavior normal.        Thought Content: Thought content normal.        Judgment: Judgment normal.       Assessment/Plan: 61 year old female with recalcitrant reflux but high BMI.  Discussed with the patient in detail about my thought process.  I am willing to perform antireflux surgery if she gets down to 250 pounds.  Discussed with her in detail. Daughter and the patient want to follow-up in 3 months.  For now continue PPI. Please note  that I spent at least 30 minutes in this encounter including coordination of her care, personally reviewing images, placing orders and performing appropriate documentation  Caroleen Hamman, MD [facility] General Surgeon

## 2021-04-07 NOTE — Patient Instructions (Addendum)
Work on losing weight. The goal is to be 250 lbs before we can do surgery.  If you have any concerns or questions, please feel free to call our office. See follow up appointment below.   Hiatal Hernia A hiatal hernia occurs when part of the stomach slides above the muscle that separates the abdomen from the chest (diaphragm). A person can be born with a hiatal hernia (congenital), or it may develop over time. In almost all cases of hiatal hernia, only the top part of the stomach pushes through the diaphragm. Many people have a hiatal hernia with no symptoms. The larger the hernia, the more likely it is that you will have symptoms. In some cases, a hiatal hernia allows stomach acid to flow back into the tube that carries food from your mouth to your stomach (esophagus). This may cause heartburn symptoms. Severe heartburn symptoms may mean that you have developed a condition called gastroesophageal reflux disease (GERD). What are the causes? This condition is caused by a weakness in the opening (hiatus) where the esophagus passes through the diaphragm to attach to the upper part of the stomach. A person may be born with a weakness in the hiatus, or a weakness can develop over time. What increases the risk? This condition is more likely to develop in: Older people. Age is a major risk factor for a hiatal hernia, especially if you are over the age of 64. Pregnant women. People who are overweight. People who have frequent constipation. What are the signs or symptoms? Symptoms of this condition usually develop in the form of GERD symptoms. Symptoms include: Heartburn. Belching. Indigestion. Trouble swallowing. Coughing or wheezing. Sore throat. Hoarseness. Chest pain. Nausea and vomiting. How is this diagnosed? This condition may be diagnosed during testing for GERD. Tests that may be done include: X-rays of your stomach or chest. An upper gastrointestinal (GI) series. This is an X-ray exam of  your GI tract that is taken after you swallow a chalky liquid that shows up clearly on the X-ray. Endoscopy. This is a procedure to look into your stomach using a thin, flexible tube that has a tiny camera and light on the end of it. How is this treated? This condition may be treated by: Dietary and lifestyle changes to help reduce GERD symptoms. Medicines. These may include: Over-the-counter antacids. Medicines that make your stomach empty more quickly. Medicines that block the production of stomach acid (H2 blockers). Stronger medicines to reduce stomach acid (proton pump inhibitors). Surgery to repair the hernia, if other treatments are not helping. If you have no symptoms, you may not need treatment. Follow these instructions at home: Lifestyle and activity Do not use any products that contain nicotine or tobacco, such as cigarettes and e-cigarettes. If you need help quitting, ask your health care provider. Try to achieve and maintain a healthy body weight. Avoid putting pressure on your abdomen. Anything that puts pressure on your abdomen increases the amount of acid that may be pushed up into your esophagus. Avoid bending over, especially after eating. Raise the head of your bed by putting blocks under the legs. This keeps your head and esophagus higher than your stomach. Do not wear tight clothing around your chest or stomach. Try not to strain when having a bowel movement, when urinating, or when lifting heavy objects. Eating and drinking Avoid foods that can worsen GERD symptoms. These may include: Fatty foods, like fried foods. Citrus fruits, like oranges or lemon. Other foods and drinks that  contain acid, like orange juice or tomatoes. Spicy food. Chocolate. Eat frequent small meals instead of three large meals a day. This helps prevent your stomach from getting too full. Eat slowly. Do not lie down right after eating. Do not eat 1-2 hours before bed. Do not drink beverages  with caffeine. These include cola, coffee, cocoa, and tea. Do not drink alcohol. General instructions Take over-the-counter and prescription medicines only as told by your health care provider. Keep all follow-up visits as told by your health care provider. This is important. Contact a health care provider if: Your symptoms are not controlled with medicines or lifestyle changes. You are having trouble swallowing. You have coughing or wheezing that will not go away. Get help right away if: Your pain is getting worse. Your pain spreads to your arms, neck, jaw, teeth, or back. You have shortness of breath. You sweat for no reason. You feel sick to your stomach (nauseous) or you vomit. You vomit blood. You have bright red blood in your stools. You have black, tarry stools. Summary A hiatal hernia occurs when part of the stomach slides above the muscle that separates the abdomen from the chest (diaphragm). A person may be born with a weakness in the hiatus, or a weakness can develop over time. Symptoms of hiatal hernia may include heartburn, trouble swallowing, or sore throat. Management of hiatal hernia includes eating frequent small meals instead of three large meals a day. Get help right away if you vomit blood, have bright red blood in your stools, or have black, tarry stools. This information is not intended to replace advice given to you by your health care provider. Make sure you discuss any questions you have with your health care provider. Calorie Counting for Weight Loss Calories are units of energy. Your body needs a certain number of calories from food to keep going throughout the day. When you eat or drink more calories than your body needs, your body stores the extra calories mostly as fat. When you eat or drink fewer calories than your body needs, your body burns fat to get the energy it needs. Calorie counting means keeping track of how many calories you eat and drink each day.  Calorie counting can be helpful if you need to lose weight. If you eat fewer calories than your body needs, you should lose weight. Ask your health care provider what a healthy weight is for you. For calorie counting to work, you will need to eat the right number of calories each day to lose a healthy amount of weight per week. A dietitian can help you figure out how many calories you need in a day and will suggest ways to reach your calorie goal. A healthy amount of weight to lose each week is usually 1-2 lb (0.5-0.9 kg). This usually means that your daily calorie intake should be reduced by 500-750 calories. Eating 1,200-1,500 calories a day can help most women lose weight. Eating 1,500-1,800 calories a day can help most men lose weight. What do I need to know about calorie counting? Work with your health care provider or dietitian to determine how many calories you should get each day. To meet your daily calorie goal, you will need to: Find out how many calories are in each food that you would like to eat. Try to do this before you eat. Decide how much of the food you plan to eat. Keep a food log. Do this by writing down what you ate and  how many calories it had. To successfully lose weight, it is important to balance calorie counting with a healthy lifestyle that includes regular activity. Where do I find calorie information? The number of calories in a food can be found on a Nutrition Facts label. If a food does not have a Nutrition Facts label, try to look up the calories online or ask your dietitian for help. Remember that calories are listed per serving. If you choose to have more than one serving of a food, you will have to multiply the calories per serving by the number of servings you plan to eat. For example, the label on a package of bread might say that a serving size is 1 slice and that there are 90 calories in a serving. If you eat 1 slice, you will have eaten 90 calories. If you eat 2  slices, you will have eaten 180 calories. How do I keep a food log? After each time that you eat, record the following in your food log as soon as possible: What you ate. Be sure to include toppings, sauces, and other extras on the food. How much you ate. This can be measured in cups, ounces, or number of items. How many calories were in each food and drink. The total number of calories in the food you ate. Keep your food log near you, such as in a pocket-sized notebook or on an app or website on your mobile phone. Some programs will calculate calories for you and show you how many calories you have left to meet your daily goal. What are some portion-control tips? Know how many calories are in a serving. This will help you know how many servings you can have of a certain food. Use a measuring cup to measure serving sizes. You could also try weighing out portions on a kitchen scale. With time, you will be able to estimate serving sizes for some foods. Take time to put servings of different foods on your favorite plates or in your favorite bowls and cups so you know what a serving looks like. Try not to eat straight from a food's packaging, such as from a bag or box. Eating straight from the package makes it hard to see how much you are eating and can lead to overeating. Put the amount you would like to eat in a cup or on a plate to make sure you are eating the right portion. Use smaller plates, glasses, and bowls for smaller portions and to prevent overeating. Try not to multitask. For example, avoid watching TV or using your computer while eating. If it is time to eat, sit down at a table and enjoy your food. This will help you recognize when you are full. It will also help you be more mindful of what and how much you are eating. What are tips for following this plan? Reading food labels Check the calorie count compared with the serving size. The serving size may be smaller than what you are used to  eating. Check the source of the calories. Try to choose foods that are high in protein, fiber, and vitamins, and low in saturated fat, trans fat, and sodium. Shopping Read nutrition labels while you shop. This will help you make healthy decisions about which foods to buy. Pay attention to nutrition labels for low-fat or fat-free foods. These foods sometimes have the same number of calories or more calories than the full-fat versions. They also often have added sugar, starch, or salt  to make up for flavor that was removed with the fat. Make a grocery list of lower-calorie foods and stick to it. Cooking Try to cook your favorite foods in a healthier way. For example, try baking instead of frying. Use low-fat dairy products. Meal planning Use more fruits and vegetables. One-half of your plate should be fruits and vegetables. Include lean proteins, such as chicken, [place], and fish. Lifestyle Each week, aim to do one of the following: 150 minutes of moderate exercise, such as walking. 75 minutes of vigorous exercise, such as running. General information Know how many calories are in the foods you eat most often. This will help you calculate calorie counts faster. Find a way of tracking calories that works for you. Get creative. Try different apps or programs if writing down calories does not work for you. What foods should I eat?  Eat nutritious foods. It is better to have a nutritious, high-calorie food, such as an avocado, than a food with few nutrients, such as a bag of potato chips. Use your calories on foods and drinks that will fill you up and will not leave you hungry soon after eating. Examples of foods that fill you up are nuts and nut butters, vegetables, lean proteins, and high-fiber foods such as whole grains. High-fiber foods are foods with more than 5 g of fiber per serving. Pay attention to calories in drinks. Low-calorie drinks include water and unsweetened drinks. The items  listed above may not be a complete list of foods and beverages you can eat. Contact a dietitian for more information. What foods should I limit? Limit foods or drinks that are not good sources of vitamins, minerals, or protein or that are high in unhealthy fats. These include: Candy. Other sweets. Sodas, specialty coffee drinks, alcohol, and juice. The items listed above may not be a complete list of foods and beverages you should avoid. Contact a dietitian for more information. How do I count calories when eating out? Pay attention to portions. Often, portions are much larger when eating out. Try these tips to keep portions smaller: Consider sharing a meal instead of getting your own. If you get your own meal, eat only half of it. Before you start eating, ask for a container and put half of your meal into it. When available, consider ordering smaller portions from the menu instead of full portions. Pay attention to your food and drink choices. Knowing the way food is cooked and what is included with the meal can help you eat fewer calories. If calories are listed on the menu, choose the lower-calorie options. Choose dishes that include vegetables, fruits, whole grains, low-fat dairy products, and lean proteins. Choose items that are boiled, broiled, grilled, or steamed. Avoid items that are buttered, battered, fried, or served with cream sauce. Items labeled as crispy are usually fried, unless stated otherwise. Choose water, low-fat milk, unsweetened iced tea, or other drinks without added sugar. If you want an alcoholic beverage, choose a lower-calorie option, such as a glass of wine or light beer. Ask for dressings, sauces, and syrups on the side. These are usually high in calories, so you should limit the amount you eat. If you want a salad, choose a garden salad and ask for grilled meats. Avoid extra toppings such as bacon, cheese, or fried items. Ask for the dressing on the side, or ask for  olive oil and vinegar or lemon to use as dressing. Estimate how many servings of a food you are  given. Knowing serving sizes will help you be aware of how much food you are eating at restaurants. Where to find more information Centers for Disease Control and Prevention: http://www.wolf.info/ [place] Department of Agriculture: http://www.wilson-mendoza.org/ Summary Calorie counting means keeping track of how many calories you eat and drink each day. If you eat fewer calories than your body needs, you should lose weight. A healthy amount of weight to lose per week is usually 1-2 lb (0.5-0.9 kg). This usually means reducing your daily calorie intake by 500-750 calories. The number of calories in a food can be found on a Nutrition Facts label. If a food does not have a Nutrition Facts label, try to look up the calories online or ask your dietitian for help. Use smaller plates, glasses, and bowls for smaller portions and to prevent overeating. Use your calories on foods and drinks that will fill you up and not leave you hungry shortly after a meal. This information is not intended to replace advice given to you by your health care provider. Make sure you discuss any questions you have with your health care provider. Document Revised: 04/06/2019 Document Reviewed: 04/06/2019 Elsevier Patient Education  2022 [employer].

## 2021-04-15 DIAGNOSIS — G4733 Obstructive sleep apnea (adult) (pediatric): Secondary | ICD-10-CM | POA: Diagnosis not present

## 2021-04-18 DIAGNOSIS — M1711 Unilateral primary osteoarthritis, right knee: Secondary | ICD-10-CM | POA: Diagnosis not present

## 2021-04-28 ENCOUNTER — Encounter: Payer: Self-pay | Admitting: Internal Medicine

## 2021-04-28 ENCOUNTER — Other Ambulatory Visit: Payer: Self-pay

## 2021-04-28 ENCOUNTER — Ambulatory Visit (INDEPENDENT_AMBULATORY_CARE_PROVIDER_SITE_OTHER): Payer: BC Managed Care – PPO

## 2021-04-28 ENCOUNTER — Ambulatory Visit: Payer: BC Managed Care – PPO | Admitting: Internal Medicine

## 2021-04-28 VITALS — BP 138/80 | HR 69 | Temp 97.8°F | Resp 16 | Ht 66.0 in | Wt 285.0 lb

## 2021-04-28 DIAGNOSIS — K219 Gastro-esophageal reflux disease without esophagitis: Secondary | ICD-10-CM

## 2021-04-28 DIAGNOSIS — Z7189 Other specified counseling: Secondary | ICD-10-CM | POA: Diagnosis not present

## 2021-04-28 DIAGNOSIS — G4733 Obstructive sleep apnea (adult) (pediatric): Secondary | ICD-10-CM | POA: Diagnosis not present

## 2021-04-28 DIAGNOSIS — E538 Deficiency of other specified B group vitamins: Secondary | ICD-10-CM

## 2021-04-28 MED ORDER — CYANOCOBALAMIN 1000 MCG/ML IJ SOLN
1000.0000 ug | Freq: Once | INTRAMUSCULAR | Status: AC
  Administered 2021-04-28: 1000 ug via INTRAMUSCULAR

## 2021-04-28 NOTE — Patient Instructions (Signed)
Sleep Apnea ?Sleep apnea affects breathing during sleep. It causes breathing to stop for 10 seconds or more, or to become shallow. People with sleep apnea usually snore loudly. ?It can also increase the risk of: ?Heart attack. ?Stroke. ?Being very overweight (obese). ?Diabetes. ?Heart failure. ?Irregular heartbeat. ?High blood pressure. ?The goal of treatment is to help you breathe normally again. ?What are the causes? ?The most common cause of this condition is a collapsed or blocked airway. ?There are three kinds of sleep apnea: ?Obstructive sleep apnea. This is caused by a blocked or collapsed airway. ?Central sleep apnea. This happens when the brain does not send the right signals to the muscles that control breathing. ?Mixed sleep apnea. This is a combination of obstructive and central sleep apnea. ?What increases the risk? ?Being overweight. ?Smoking. ?Having a small airway. ?Being older. ?Being female. ?Drinking alcohol. ?Taking medicines to calm yourself (sedatives or tranquilizers). ?Having family members with the condition. ?Having a tongue or tonsils that are larger than normal. ?What are the signs or symptoms? ?Trouble staying asleep. ?Loud snoring. ?Headaches in the morning. ?Waking up gasping. ?Dry mouth or sore throat in the morning. ?Being sleepy or tired during the day. ?If you are sleepy or tired during the day, you may also: ?Not be able to focus your mind (concentrate). ?Forget things. ?Get angry a lot and have mood swings. ?Feel sad (depressed). ?Have changes in your personality. ?Have less interest in sex, if you are female. ?Be unable to have an erection, if you are female. ?How is this treated? ? ?Sleeping on your side. ?Using a medicine to get rid of mucus in your nose (decongestant). ?Avoiding the use of alcohol, medicines to help you relax, or certain pain medicines (narcotics). ?Losing weight, if needed. ?Changing your diet. ?Quitting smoking. ?Using a machine to open your airway while you  sleep, such as: ?An oral appliance. This is a mouthpiece that shifts your lower jaw forward. ?A CPAP device. This device blows air through a mask when you breathe out (exhale). ?An EPAP device. This has valves that you put in each nostril. ?A BIPAP device. This device blows air through a mask when you breathe in (inhale) and breathe out. ?Having surgery if other treatments do not work. ?Follow these instructions at home: ?Lifestyle ?Make changes that your doctor recommends. ?Eat a healthy diet. ?Lose weight if needed. ?Avoid alcohol, medicines to help you relax, and some pain medicines. ?Do not smoke or use any products that contain nicotine or tobacco. If you need help quitting, ask your doctor. ?General instructions ?Take over-the-counter and prescription medicines only as told by your doctor. ?If you were given a machine to use while you sleep, use it only as told by your doctor. ?If you are having surgery, make sure to tell your doctor you have sleep apnea. You may need to bring your device with you. ?Keep all follow-up visits. ?Contact a doctor if: ?The machine that you were given to use during sleep bothers you or does not seem to be working. ?You do not get better. ?You get worse. ?Get help right away if: ?Your chest hurts. ?You have trouble breathing in enough air. ?You have an uncomfortable feeling in your back, arms, or stomach. ?You have trouble talking. ?One side of your body feels weak. ?A part of your face is hanging down. ?These symptoms may be an emergency. Get help right away. Call your local emergency services (911 in the [place] ?Do not wait to see if the symptoms   will go away. ?Do not drive yourself to the hospital. ?Summary ?This condition affects breathing during sleep. ?The most common cause is a collapsed or blocked airway. ?The goal of treatment is to help you breathe normally while you sleep. ?This information is not intended to replace advice given to you by your health care provider. Make  sure you discuss any questions you have with your health care provider. ?Document Revised: 10/02/2020 Document Reviewed: 02/02/2020 ?Elsevier Patient Education ? 2022 [employer] ? ?

## 2021-04-28 NOTE — Progress Notes (Signed)
[facility] [place], [place] [address]  Pulmonary Sleep Medicine   Office Visit Note  Patient Name: Shelley [name] DOB: 1961/02/05 MRN [id number]  Date of Service: 04/28/2021  Complaints/HPI: OSA . She states that she is using her CPAP nightly. She states that she does have a good refreshing sleep at night. Last compliance was 100% on the therapy. Patient has major issues with nasal congestion or other side effects from the machine. No admissions to the hospital. Supplies are up to date  ROS  General: (-) fever, (-) chills, (-) night sweats, (-) weakness Skin: (-) rashes, (-) itching,. Eyes: (-) visual changes, (-) redness, (-) itching. Nose and Sinuses: (-) nasal stuffiness or itchiness, (-) postnasal drip, (-) nosebleeds, (-) sinus trouble. Mouth and Throat: (-) sore throat, (-) hoarseness. Neck: (-) swollen glands, (-) enlarged thyroid, (-) neck pain. Respiratory: - cough, (-) bloody sputum, - shortness of breath, - wheezing. Cardiovascular: - ankle swelling, (-) chest pain. Lymphatic: (-) lymph node enlargement. Neurologic: (-) numbness, (-) tingling. Psychiatric: (-) anxiety, (-) depression   Current Medication: Outpatient Encounter Medications as of 04/28/2021  Medication Sig   albuterol (VENTOLIN HFA) 108 (90 Base) MCG/ACT inhaler Inhale 2 puffs into the lungs every 6 (six) hours as needed for wheezing or shortness of breath.   ALPRAZolam (XANAX) 0.25 MG tablet Take half tab twice a day as needed for severe panic attacks   azelastine (ASTELIN) 0.1 % nasal spray Place 2 sprays into both nostrils 2 (two) times daily. Use in each nostril as directed   diclofenac Sodium (VOLTAREN) 1 % GEL SMARTSIG:Gram(s) Topical 4 Times Daily PRN   diltiazem (DILACOR XR) 180 MG 24 hr capsule Take 180 mg by mouth daily.   escitalopram (LEXAPRO) 10 MG tablet TAKE 1 TABLET BY MOUTH ONCE DAILY WITH  SUPPER  FOR  PANIC  ATTACKS   meclizine (ANTIVERT) 25 MG tablet Take  25 mg by mouth 3 (three) times daily as needed.   meloxicam (MOBIC) 15 MG tablet Take 1 tablet (15 mg total) by mouth daily as needed.   Multiple Vitamin (MULTIVITAMIN) tablet Take 1 tablet by mouth daily.   omeprazole (PRILOSEC) 40 MG capsule TAKE 1 CAPSULE BY MOUTH TWICE DAILY FOR HEARTBURN   Sod Picosulfate-Mag Ox-Cit Acd (CLENPIQ) 10-3.5-12 MG-GM -GM/160ML SOLN Take 320 mLs by mouth as directed.   No facility-administered encounter medications on file as of 04/28/2021.    Surgical History: Past Surgical History:  Procedure Laterality Date   COLONOSCOPY WITH PROPOFOL N/A 02/27/2021   Procedure: COLONOSCOPY WITH PROPOFOL;  Surgeon: Lucilla Lame, MD;  Location: [facility] ENDOSCOPY;  Service: Endoscopy;  Laterality: N/A;   ESOPHAGOGASTRODUODENOSCOPY  02/27/2021   Procedure: ESOPHAGOGASTRODUODENOSCOPY (EGD);  Surgeon: Lucilla Lame, MD;  Location: [facility] ENDOSCOPY;  Service: Endoscopy;;   RIGHT/LEFT HEART CATH AND CORONARY ANGIOGRAPHY Bilateral 05/26/2019   Procedure: RIGHT/LEFT HEART CATH AND CORONARY ANGIOGRAPHY;  Surgeon: Nelva Bush, MD;  Location: [facility];  Service: Cardiovascular;  Laterality: Bilateral;   TUBAL LIGATION      Medical History: Past Medical History:  Diagnosis Date   Anemia    Anxiety    Arthritis    Cardiomegaly    Family history of breast cancer    Family history of ovarian cancer    5/22 cancer genetic testing letter sent   Hypertension    Hyperthyroidism    Pulmonary hypertension ([place])    Sleep apnea    Thyroid disease    Vertigo  Family History: Family History  Problem Relation Age of Onset   Heart attack Mother    Ovarian cancer Mother    Cancer Father    Cancer Sister    Breast cancer Maternal Grandmother    Breast cancer Cousin        1st maternal cousin   Cancer Other     Social History: Social History   Socioeconomic History   Marital status: Single    Spouse name: Not on file   Number of children: Not on file   Years  of education: Not on file   Highest education level: Not on file  Occupational History   Not on file  Tobacco Use   Smoking status: Never   Smokeless tobacco: Never  Vaping Use   Vaping Use: Never used  Substance and Sexual Activity   Alcohol use: No   Drug use: No   Sexual activity: Yes    Birth control/protection: I.U.D.  Other Topics Concern   Not on file  Social History Narrative   Not on file   Social Determinants of Health   Financial Resource Strain: Not on file  Food Insecurity: Not on file  Transportation Needs: Not on file  Physical Activity: Not on file  Stress: Not on file  Social Connections: Not on file  Intimate Partner Violence: Not on file    Vital Signs: Blood pressure 138/80, pulse 69, temperature 97.8 F (36.6 C), resp. rate 16, height 5\' 6"  (1.676 m), weight 285 lb (129.3 kg), SpO2 98 %.  Examination: General Appearance: The patient is well-developed, well-nourished, and in no distress. Skin: Gross inspection of skin unremarkable. Head: normocephalic, no gross deformities. Eyes: no gross deformities noted. ENT: ears appear grossly normal no exudates. Neck: Supple. No thyromegaly. No LAD. Respiratory: no rhonchi noted. Cardiovascular: Normal S1 and S2 without murmur or rub. Extremities: No cyanosis. pulses are equal. Neurologic: Alert and oriented. No involuntary movements.  LABS: Recent Results (from the past 2160 hour(s))  Surgical pathology     Status: None   Collection Time: 02/27/21  9:20 AM  Result Value Ref Range   SURGICAL PATHOLOGY      SURGICAL PATHOLOGY CASE: [id number] PATIENT: Shelley [name] Surgical Pathology Report     Specimen Submitted: A. Colon polyp, cecum; cbx B. Colon polyp x3, tran; cbx(2) c snare(1)  Clinical History: Nausea/vomiting R11.0 GERD K21.9 screening Z12.11. Small hiatal hernia; colon polyp      DIAGNOSIS: A. COLON POLYP, CECUM; COLD BIOPSY: - TUBULAR ADENOMA. - NEGATIVE FOR HIGH GRADE  DYSPLASIA AND MALIGNANCY.  B. COLON POLYP X3, TRANSVERSE; COLD BIOPSY AND COLD SNARE: - TUBULAR ADENOMA, MULTIPLE FRAGMENTS. - NEGATIVE FOR HIGH GRADE DYSPLASIA AND MALIGNANCY.   GROSS DESCRIPTION: A. Labeled: cbx polyp cecum Received: Formalin Collection time: 9:20 AM on 02/27/2021 Placed into formalin time: 9:20 AM on 02/27/2021 Tissue fragment(s): 1 Size: 0.5 x 0.2 x 0.1 cm Description: Tan elongated soft tissue fragment Entirely submitted in 1 cassette.  B. Labeled: cbx polyp transverse colon x2, cold snare polyp transverse colon x1 Received: Formalin Collection  time: 9:22 AM on 02/27/2021 Placed into formalin time: 9:22 AM on 02/27/2021 Tissue fragment(s): Multiple Size: Aggregate, 1.2 x 0.3 x 0.2 cm Description: Received are fragments of tan soft tissue admixed with intestinal debris.  The ratio of soft tissue to intestinal debris is 80:20. Entirely submitted in 1 cassette.  RB 02/27/2021  Final Diagnosis performed by Betsy Pries, MD.   Electronically signed 02/28/2021 8:23:23AM The electronic signature indicates that  the named Attending Pathologist has evaluated the specimen Technical component performed at [facility], [address] [place], [place] [address] Lab: [phone] Dir: Rush Farmer, MD, MMM  Professional component performed at [facility], [facility], [place], [place], [place] [address] Lab: [phone] Dir: Kathi Simpers, MD     Radiology: No results found.  No results found.  No results found.    Assessment and Plan: Patient Active Problem List   Diagnosis Date Noted   Screen for colon cancer    Polyp of transverse colon    Gastroesophageal reflux disease without esophagitis    OSA (obstructive sleep apnea) 09/16/2020   Other hypersomnia 09/16/2020   Encounter for general adult medical examination with abnormal findings 06/19/2019   Encounter for screening mammogram for malignant neoplasm of breast  06/19/2019   Pulmonary hypertension (HCC)    Shortness of breath    Pulmonary hypertension, primary ([place]) 01/04/2019   Acute pain of left shoulder 11/23/2018   Cardiomegaly 11/23/2018   Abnormal ECG 11/23/2018   Acquired hypothyroidism 05/22/2018   Seasonal allergies 05/22/2018   Acute anxiety 05/22/2018   Dysuria 05/22/2018   Hypertension 01/05/2018   Routine cervical smear 01/05/2018   Elevated ferritin 11/26/2015   Thrombocytosis 11/26/2015   1. OSA (obstructive sleep apnea) Excellent control patient has 100% compliance.  Still with some residual symptoms however she continues to work through the usage of the CPAP  2. Gastroesophageal reflux disease without esophagitis  Control PPI as necessary we will continue to monitor.  3. Obesity, morbid ([place]) Obesity Counseling: Had a lengthy discussion regarding patients BMI and weight issues. Patient was instructed on portion control as well as increased activity. Also discussed caloric restrictions with trying to maintain intake less than 2000 Kcal. Discussions were made in accordance with the 5As of weight management. Simple actions such as not eating late and if able to, taking a walk is suggested.   4. CPAP use counseling CPAP Counseling: had a lengthy discussion with the patient regarding the importance of PAP therapy in management of the sleep apnea. Patient appears to understand the risk factor reduction and also understands the risks associated with untreated sleep apnea. Patient will try to make a good faith effort to remain compliant with therapy. Also instructed the patient on proper cleaning of the device including the water must be changed daily if possible and use of distilled water is preferred. Patient understands that the machine should be regularly cleaned with appropriate recommended cleaning solutions that do not damage the PAP machine for example given white vinegar and water rinses. Other methods such as ozone treatment  may not be as good as these simple methods to achieve cleaning.    General Counseling: I have discussed the findings of the evaluation and examination with Carlynn.  I have also discussed any further diagnostic evaluation thatmay be needed or ordered today. Natalynn verbalizes understanding of the findings of todays visit. We also reviewed her medications today and discussed drug interactions and side effects including but not limited excessive drowsiness and altered mental states. We also discussed that there is always a risk not just to her but also people around her. she has been encouraged to call the office with any questions or concerns that should arise related to todays visit.  No orders of the defined types were placed in this encounter.    Time spent: 4  I have personally obtained a history, examined the patient, evaluated laboratory and imaging results, formulated the assessment  and plan and placed orders.    Allyne Gee, MD [facility] Pulmonary and Critical Care Sleep medicine

## 2021-05-12 DIAGNOSIS — I270 Primary pulmonary hypertension: Secondary | ICD-10-CM | POA: Diagnosis not present

## 2021-05-13 DIAGNOSIS — G4733 Obstructive sleep apnea (adult) (pediatric): Secondary | ICD-10-CM | POA: Diagnosis not present

## 2021-05-23 ENCOUNTER — Other Ambulatory Visit: Payer: Self-pay

## 2021-05-23 ENCOUNTER — Telehealth: Payer: Self-pay

## 2021-05-23 DIAGNOSIS — R0602 Shortness of breath: Secondary | ICD-10-CM

## 2021-05-23 NOTE — Telephone Encounter (Signed)
Left vm to confirm 05/28/21 appointment-Toni ?

## 2021-05-27 ENCOUNTER — Ambulatory Visit: Payer: BC Managed Care – PPO | Admitting: Internal Medicine

## 2021-05-28 ENCOUNTER — Other Ambulatory Visit: Payer: Self-pay

## 2021-05-28 ENCOUNTER — Ambulatory Visit (INDEPENDENT_AMBULATORY_CARE_PROVIDER_SITE_OTHER): Payer: BC Managed Care – PPO

## 2021-05-28 ENCOUNTER — Ambulatory Visit: Payer: BC Managed Care – PPO | Admitting: Internal Medicine

## 2021-05-28 DIAGNOSIS — E538 Deficiency of other specified B group vitamins: Secondary | ICD-10-CM | POA: Diagnosis not present

## 2021-05-28 DIAGNOSIS — R0602 Shortness of breath: Secondary | ICD-10-CM | POA: Diagnosis not present

## 2021-05-28 MED ORDER — CYANOCOBALAMIN 1000 MCG/ML IJ SOLN
1000.0000 ug | Freq: Once | INTRAMUSCULAR | Status: AC
  Administered 2021-05-28: 1000 ug via INTRAMUSCULAR

## 2021-05-29 ENCOUNTER — Ambulatory Visit: Payer: BC Managed Care – PPO | Admitting: Physician Assistant

## 2021-06-09 ENCOUNTER — Ambulatory Visit: Payer: BC Managed Care – PPO | Admitting: Internal Medicine

## 2021-06-10 NOTE — Procedures (Signed)
[facility] ?[address] ?[place], [address] ? ? ? ?Complete Pulmonary Function Testing Interpretation: ? ?FINDINGS: ? ?Forced vital capacity is mildly decreased.  FEV1 is 1.95 L which is 86% of predicted and is normal.  FEV1 FVC ratio was normal.  Postbronchodilator no significant change in the FEV1 clinical improvement may occur in the absence of spirometric improvement.  Total lung capacity was mildly decreased.  FRC was normal residual volume was decreased.  Residual volume total lung capacity ratio was increased.  The DLCO was normal. ? ?IMPRESSION: ? ?This pulmonary function study is suggestive of mild restrictive lung disease clinical correlation is recommended ? ?Allyne Gee, MD FCCP ?Pulmonary Critical Care Medicine ?Sleep Medicine ? ?

## 2021-06-11 ENCOUNTER — Encounter: Payer: Self-pay | Admitting: Internal Medicine

## 2021-06-11 LAB — PULMONARY FUNCTION TEST

## 2021-06-12 ENCOUNTER — Encounter: Payer: BC Managed Care – PPO | Admitting: Physician Assistant

## 2021-06-12 DIAGNOSIS — I270 Primary pulmonary hypertension: Secondary | ICD-10-CM | POA: Diagnosis not present

## 2021-06-13 DIAGNOSIS — G4733 Obstructive sleep apnea (adult) (pediatric): Secondary | ICD-10-CM | POA: Diagnosis not present

## 2021-06-25 ENCOUNTER — Ambulatory Visit (INDEPENDENT_AMBULATORY_CARE_PROVIDER_SITE_OTHER): Payer: BC Managed Care – PPO

## 2021-06-25 DIAGNOSIS — E538 Deficiency of other specified B group vitamins: Secondary | ICD-10-CM

## 2021-06-25 MED ORDER — CYANOCOBALAMIN 1000 MCG/ML IJ SOLN
1000.0000 ug | Freq: Once | INTRAMUSCULAR | Status: AC
  Administered 2021-06-25: 1000 ug via INTRAMUSCULAR

## 2021-06-26 ENCOUNTER — Other Ambulatory Visit: Payer: Self-pay | Admitting: Nurse Practitioner

## 2021-06-26 DIAGNOSIS — K219 Gastro-esophageal reflux disease without esophagitis: Secondary | ICD-10-CM

## 2021-06-30 ENCOUNTER — Telehealth: Payer: Self-pay

## 2021-06-30 ENCOUNTER — Other Ambulatory Visit: Payer: Self-pay | Admitting: Physician Assistant

## 2021-06-30 DIAGNOSIS — E782 Mixed hyperlipidemia: Secondary | ICD-10-CM

## 2021-06-30 DIAGNOSIS — E559 Vitamin D deficiency, unspecified: Secondary | ICD-10-CM

## 2021-06-30 DIAGNOSIS — E538 Deficiency of other specified B group vitamins: Secondary | ICD-10-CM

## 2021-06-30 DIAGNOSIS — Z0001 Encounter for general adult medical examination with abnormal findings: Secondary | ICD-10-CM

## 2021-06-30 DIAGNOSIS — E039 Hypothyroidism, unspecified: Secondary | ICD-10-CM

## 2021-06-30 DIAGNOSIS — R5383 Other fatigue: Secondary | ICD-10-CM

## 2021-07-01 NOTE — Telephone Encounter (Signed)
Pt called back and she was informed that lab orders were placed and to be fasting prior.  Pt understood ?

## 2021-07-07 ENCOUNTER — Ambulatory Visit: Payer: BC Managed Care – PPO | Admitting: Surgery

## 2021-07-07 ENCOUNTER — Encounter: Payer: Self-pay | Admitting: Surgery

## 2021-07-07 VITALS — BP 145/88 | HR 83 | Temp 98.0°F | Ht 66.0 in | Wt 287.0 lb

## 2021-07-07 DIAGNOSIS — E782 Mixed hyperlipidemia: Secondary | ICD-10-CM | POA: Diagnosis not present

## 2021-07-07 DIAGNOSIS — E039 Hypothyroidism, unspecified: Secondary | ICD-10-CM | POA: Diagnosis not present

## 2021-07-07 DIAGNOSIS — K449 Diaphragmatic hernia without obstruction or gangrene: Secondary | ICD-10-CM

## 2021-07-07 DIAGNOSIS — R5383 Other fatigue: Secondary | ICD-10-CM | POA: Diagnosis not present

## 2021-07-07 DIAGNOSIS — Z0001 Encounter for general adult medical examination with abnormal findings: Secondary | ICD-10-CM | POA: Diagnosis not present

## 2021-07-07 DIAGNOSIS — E559 Vitamin D deficiency, unspecified: Secondary | ICD-10-CM | POA: Diagnosis not present

## 2021-07-07 DIAGNOSIS — E538 Deficiency of other specified B group vitamins: Secondary | ICD-10-CM | POA: Diagnosis not present

## 2021-07-07 NOTE — Patient Instructions (Signed)
If you have any concerns or questions, please feel free to call our office.  ? ?Hiatal Hernia ? ?A hiatal hernia occurs when part of the stomach slides above the muscle that separates the abdomen from the chest (diaphragm). A person can be born with a hiatal hernia (congenital), or it may develop over time. In almost all cases of hiatal hernia, only the top part of the stomach pushes through the diaphragm. ?Many people have a hiatal hernia with no symptoms. The larger the hernia, the more likely it is that you will have symptoms. In some cases, a hiatal hernia allows stomach acid to flow back into the tube that carries food from your mouth to your stomach (esophagus). This may cause heartburn symptoms. Severe heartburn symptoms may mean that you have developed a condition called gastroesophageal reflux disease (GERD). ?What are the causes? ?This condition is caused by a weakness in the opening (hiatus) where the esophagus passes through the diaphragm to attach to the upper part of the stomach. A person may be born with a weakness in the hiatus, or a weakness can develop over time. ?What increases the risk? ?This condition is more likely to develop in: ?Older people. Age is a major risk factor for a hiatal hernia, especially if you are over the age of 47. ?Pregnant women. ?People who are overweight. ?People who have frequent constipation. ?What are the signs or symptoms? ?Symptoms of this condition usually develop in the form of GERD symptoms. Symptoms include: ?Heartburn. ?Belching. ?Indigestion. ?Trouble swallowing. ?Coughing or wheezing. ?Sore throat. ?Hoarseness. ?Chest pain. ?Nausea and vomiting. ?How is this diagnosed? ?This condition may be diagnosed during testing for GERD. Tests that may be done include: ?X-rays of your stomach or chest. ?An upper gastrointestinal (GI) series. This is an X-ray exam of your GI tract that is taken after you swallow a chalky liquid that shows up clearly on the  X-ray. ?Endoscopy. This is a procedure to look into your stomach using a thin, flexible tube that has a tiny camera and light on the end of it. ?How is this treated? ?This condition may be treated by: ?Dietary and lifestyle changes to help reduce GERD symptoms. ?Medicines. These may include: ?Over-the-counter antacids. ?Medicines that make your stomach empty more quickly. ?Medicines that block the production of stomach acid (H2 blockers). ?Stronger medicines to reduce stomach acid (proton pump inhibitors). ?Surgery to repair the hernia, if other treatments are not helping. ?If you have no symptoms, you may not need treatment. ?Follow these instructions at home: ?Lifestyle and activity ?Do not use any products that contain nicotine or tobacco, such as cigarettes and e-cigarettes. If you need help quitting, ask your health care provider. ?Try to achieve and maintain a healthy body weight. ?Avoid putting pressure on your abdomen. Anything that puts pressure on your abdomen increases the amount of acid that may be pushed up into your esophagus. ?Avoid bending over, especially after eating. ?Raise the head of your bed by putting blocks under the legs. This keeps your head and esophagus higher than your stomach. ?Do not wear tight clothing around your chest or stomach. ?Try not to strain when having a bowel movement, when urinating, or when lifting heavy objects. ?Eating and drinking ?Avoid foods that can worsen GERD symptoms. These may include: ?Fatty foods, like fried foods. ?Citrus fruits, like oranges or lemon. ?Other foods and drinks that contain acid, like orange juice or tomatoes. ?Spicy food. ?Chocolate. ?Eat frequent small meals instead of three large meals a day.  This helps prevent your stomach from getting too full. ?Eat slowly. ?Do not lie down right after eating. ?Do not eat 1-2 hours before bed. ?Do not drink beverages with caffeine. These include cola, coffee, cocoa, and tea. ?Do not drink alcohol. ?General  instructions ?Take over-the-counter and prescription medicines only as told by your health care provider. ?Keep all follow-up visits as told by your health care provider. This is important. ?Contact a health care provider if: ?Your symptoms are not controlled with medicines or lifestyle changes. ?You are having trouble swallowing. ?You have coughing or wheezing that will not go away. ?Get help right away if: ?Your pain is getting worse. ?Your pain spreads to your arms, neck, jaw, teeth, or back. ?You have shortness of breath. ?You sweat for no reason. ?You feel sick to your stomach (nauseous) or you vomit. ?You vomit blood. ?You have bright red blood in your stools. ?You have black, tarry stools. ?Summary ?A hiatal hernia occurs when part of the stomach slides above the muscle that separates the abdomen from the chest (diaphragm). ?A person may be born with a weakness in the hiatus, or a weakness can develop over time. ?Symptoms of hiatal hernia may include heartburn, trouble swallowing, or sore throat. ?Management of hiatal hernia includes eating frequent small meals instead of three large meals a day. ?Get help right away if you vomit blood, have bright red blood in your stools, or have black, tarry stools. ?This information is not intended to replace advice given to you by your health care provider. Make sure you discuss any questions you have with your health care provider. ?Document Revised: 01/07/2021 Document Reviewed: 01/25/2020 ?Elsevier Patient Education ? [place]. ? ?

## 2021-07-08 ENCOUNTER — Encounter: Payer: Self-pay | Admitting: Surgery

## 2021-07-08 LAB — LIPID PANEL WITH LDL/HDL RATIO
Cholesterol, Total: 147 mg/dL (ref 100–199)
HDL: 38 mg/dL — ABNORMAL LOW (ref >39)
LDL Chol Calc (NIH): 86 mg/dL (ref 0–99)
LDL/HDL Ratio: 2.3 ratio (ref 0.0–3.2)
Triglycerides: 128 mg/dL (ref 0–149)
VLDL Cholesterol Cal: 23 mg/dL (ref 5–40)

## 2021-07-08 LAB — CBC WITH DIFFERENTIAL/PLATELET
Basophils Absolute: 0.1 10*3/uL (ref 0.0–0.2)
Basos: 1 %
EOS (ABSOLUTE): 0.2 10*3/uL (ref 0.0–0.4)
Eos: 2 %
Hematocrit: 39 % (ref 34.0–46.6)
Hemoglobin: 12.9 g/dL (ref 11.1–15.9)
Immature Grans (Abs): 0 10*3/uL (ref 0.0–0.1)
Immature Granulocytes: 0 %
Lymphocytes Absolute: 2.8 10*3/uL (ref 0.7–3.1)
Lymphs: 29 %
MCH: 28.6 pg (ref 26.6–33.0)
MCHC: 33.1 g/dL (ref 31.5–35.7)
MCV: 87 fL (ref 79–97)
Monocytes Absolute: 0.7 10*3/uL (ref 0.1–0.9)
Monocytes: 7 %
Neutrophils Absolute: 5.9 10*3/uL (ref 1.4–7.0)
Neutrophils: 61 %
Platelets: 413 10*3/uL (ref 150–450)
RBC: 4.51 x10E6/uL (ref 3.77–5.28)
RDW: 13.6 % (ref 11.7–15.4)
WBC: 9.6 10*3/uL (ref 3.4–10.8)

## 2021-07-08 LAB — COMPREHENSIVE METABOLIC PANEL
ALT: 16 IU/L (ref 0–32)
AST: 15 IU/L (ref 0–40)
Albumin/Globulin Ratio: 1.3 (ref 1.2–2.2)
Albumin: 4.1 g/dL (ref 3.8–4.8)
Alkaline Phosphatase: 68 IU/L (ref 44–121)
BUN/Creatinine Ratio: 14 (ref 12–28)
BUN: 11 mg/dL (ref 8–27)
Bilirubin Total: 0.4 mg/dL (ref 0.0–1.2)
CO2: 23 mmol/L (ref 20–29)
Calcium: 9.6 mg/dL (ref 8.7–10.3)
Chloride: 102 mmol/L (ref 96–106)
Creatinine, Ser: 0.81 mg/dL (ref 0.57–1.00)
Globulin, Total: 3.1 g/dL (ref 1.5–4.5)
Glucose: 95 mg/dL (ref 70–99)
Potassium: 4.5 mmol/L (ref 3.5–5.2)
Sodium: 139 mmol/L (ref 134–144)
Total Protein: 7.2 g/dL (ref 6.0–8.5)
eGFR: 83 mL/min/{1.73_m2} (ref >59)

## 2021-07-08 LAB — TSH+FREE T4
Free T4: 1.06 ng/dL (ref 0.82–1.77)
TSH: 1.52 u[IU]/mL (ref 0.450–4.500)

## 2021-07-08 LAB — B12 AND FOLATE PANEL
Folate: 14.9 ng/mL (ref >3.0)
Vitamin B-12: 1013 pg/mL (ref 232–1245)

## 2021-07-08 LAB — VITAMIN D 25 HYDROXY (VIT D DEFICIENCY, FRACTURES): Vit D, 25-Hydroxy: 19.3 ng/mL — ABNORMAL LOW (ref 30.0–100.0)

## 2021-07-08 NOTE — Progress Notes (Signed)
Outpatient Surgical Follow Up ? ?07/08/2021 ? ?Shelley [name] is an 61 y.o. female.  ? ?Chief Complaint  ?Patient presents with  ? Follow-up  ? ? ?HPI: Shelley [name] is a 61 y.o. female following for [facility] and GERD.She continues to endorse significant GERD. She does not have any dysphagia.   ?She has lost another 2 lbs since our last visit. Marland Kitchen ?No fevers no chills.   ?He has tried to exercise and is doing elliptical exercises. ?I have also offer her the option for referral to bariatric surgery and she is politely declining at this time ? ? ?Past Medical History:  ?Diagnosis Date  ? Anemia   ? Anxiety   ? Arthritis   ? Cardiomegaly   ? Family history of breast cancer   ? Family history of ovarian cancer   ? 5/22 cancer genetic testing letter sent  ? Hypertension   ? Hyperthyroidism   ? Pulmonary hypertension ([place])   ? Sleep apnea   ? Thyroid disease   ? Vertigo   ? ? ?Past Surgical History:  ?Procedure Laterality Date  ? COLONOSCOPY WITH PROPOFOL N/A 02/27/2021  ? Procedure: COLONOSCOPY WITH PROPOFOL;  Surgeon: Lucilla Lame, MD;  Location: [facility] ENDOSCOPY;  Service: Endoscopy;  Laterality: N/A;  ? ESOPHAGOGASTRODUODENOSCOPY  02/27/2021  ? Procedure: ESOPHAGOGASTRODUODENOSCOPY (EGD);  Surgeon: Lucilla Lame, MD;  Location: [facility] ENDOSCOPY;  Service: Endoscopy;;  ? RIGHT/LEFT HEART CATH AND CORONARY ANGIOGRAPHY Bilateral 05/26/2019  ? Procedure: RIGHT/LEFT HEART CATH AND CORONARY ANGIOGRAPHY;  Surgeon: Nelva Bush, MD;  Location: [place] CV LAB;  Service: Cardiovascular;  Laterality: Bilateral;  ? TUBAL LIGATION    ? ? ?Family History  ?Problem Relation Age of Onset  ? Heart attack Mother   ? Ovarian cancer Mother   ? Cancer Father   ? Cancer Sister   ? Breast cancer Maternal Grandmother   ? Breast cancer Cousin   ?     1st maternal cousin  ? Cancer Other   ? ? ?Social History:  reports that she has never smoked. She has never used smokeless tobacco. She reports that she does not drink alcohol and does not  use drugs. ? ?Allergies:  ?Allergies  ?Allergen Reactions  ? Amlodipine   ? ? ?Medications reviewed. ? ? ? ?ROS ?Full ROS performed and is otherwise negative other than what is stated in HPI ? ? ?BP (!) 145/88   Pulse 83   Temp 98 ?F (36.7 ?C)   Ht 5' 6"  (1.676 m)   Wt 287 lb (130.2 kg)   SpO2 97%   BMI 46.32 kg/m?  ? ?Physical Exam ?Vitals and nursing note reviewed. Exam conducted with a chaperone present.  ?Constitutional:   ?   General: She is not in acute distress. ?   Appearance: Normal appearance. She is obese. She is not ill-appearing.  ?Eyes:  ?   General: No scleral icterus.    ?   Right eye: No discharge.     ?   Left eye: No discharge.  ?Cardiovascular:  ?   Rate and Rhythm: Normal rate and regular rhythm.  ?Pulmonary:  ?   Effort: Pulmonary effort is normal. No respiratory distress.  ?   Breath sounds: Normal breath sounds. No stridor.  ?Abdominal:  ?   General: Abdomen is flat. There is no distension.  ?   Palpations: Abdomen is soft. There is no mass.  ?   Tenderness: There is no abdominal tenderness. There is no guarding or rebound.  ?  Hernia: No hernia is present.  ?Musculoskeletal:     ?   General: Normal range of motion.  ?   Cervical back: Normal range of motion and neck supple. No rigidity or tenderness.  ?Skin: ?   General: Skin is warm and dry.  ?   Capillary Refill: Capillary refill takes less than 2 seconds.  ?Neurological:  ?   General: No focal deficit present.  ?   Mental Status: She is alert and oriented to person, place, and time.  ?Psychiatric:     ?   Mood and Affect: Mood normal.     ?   Behavior: Behavior normal.     ?   Thought Content: Thought content normal.     ?   Judgment: Judgment normal.  ?  ? ? ?Results for orders placed or performed in visit on 06/30/21 (from the past 48 hour(s))  ?CBC w/Diff/Platelet     Status: None  ? Collection Time: 07/07/21  8:02 AM  ?Result Value Ref Range  ? WBC 9.6 3.4 - 10.8 x10E3/uL  ? RBC 4.51 3.77 - 5.28 x10E6/uL  ? Hemoglobin 12.9 11.1  - 15.9 g/dL  ? Hematocrit 39.0 34.0 - 46.6 %  ? MCV 87 79 - 97 fL  ? MCH 28.6 26.6 - 33.0 pg  ? MCHC 33.1 31.5 - 35.7 g/dL  ? RDW 13.6 11.7 - 15.4 %  ? Platelets 413 150 - 450 x10E3/uL  ? Neutrophils 61 Not Estab. %  ? Lymphs 29 Not Estab. %  ? Monocytes 7 Not Estab. %  ? Eos 2 Not Estab. %  ? Basos 1 Not Estab. %  ? Neutrophils Absolute 5.9 1.4 - 7.0 x10E3/uL  ? Lymphocytes Absolute 2.8 0.7 - 3.1 x10E3/uL  ? Monocytes Absolute 0.7 0.1 - 0.9 x10E3/uL  ? EOS (ABSOLUTE) 0.2 0.0 - 0.4 x10E3/uL  ? Basophils Absolute 0.1 0.0 - 0.2 x10E3/uL  ? Immature Granulocytes 0 Not Estab. %  ? Immature Grans (Abs) 0.0 0.0 - 0.1 x10E3/uL  ?Comprehensive metabolic panel     Status: None  ? Collection Time: 07/07/21  8:02 AM  ?Result Value Ref Range  ? Glucose 95 70 - 99 mg/dL  ? BUN 11 8 - 27 mg/dL  ? Creatinine, Ser 0.81 0.57 - 1.00 mg/dL  ? eGFR 83 >59 mL/min/1.73  ? BUN/Creatinine Ratio 14 12 - 28  ? Sodium 139 134 - 144 mmol/L  ? Potassium 4.5 3.5 - 5.2 mmol/L  ? Chloride 102 96 - 106 mmol/L  ? CO2 23 20 - 29 mmol/L  ? Calcium 9.6 8.7 - 10.3 mg/dL  ? Total Protein 7.2 6.0 - 8.5 g/dL  ? Albumin 4.1 3.8 - 4.8 g/dL  ? Globulin, Total 3.1 1.5 - 4.5 g/dL  ? Albumin/Globulin Ratio 1.3 1.2 - 2.2  ? Bilirubin Total 0.4 0.0 - 1.2 mg/dL  ? Alkaline Phosphatase 68 44 - 121 IU/L  ? AST 15 0 - 40 IU/L  ? ALT 16 0 - 32 IU/L  ?Lipid Panel With LDL/HDL Ratio     Status: Abnormal  ? Collection Time: 07/07/21  8:02 AM  ?Result Value Ref Range  ? Cholesterol, Total 147 100 - 199 mg/dL  ? Triglycerides 128 0 - 149 mg/dL  ? HDL 38 (L) >39 mg/dL  ? VLDL Cholesterol Cal 23 5 - 40 mg/dL  ? LDL Chol Calc (NIH) 86 0 - 99 mg/dL  ? LDL/HDL Ratio 2.3 0.0 - 3.2 ratio  ?  Comment:  LDL/HDL Ratio ?                                            Men  Women ?                              1/2 Avg.Risk  1.0    1.5 ?                                  Avg.Risk  3.6    3.2 ?                               2X Avg.Risk  6.2    5.0 ?                                3X Avg.Risk  8.0    6.1 ?  ?TSH + free T4     Status: None  ? Collection Time: 07/07/21  8:02 AM  ?Result Value Ref Range  ? TSH 1.520 0.450 - 4.500 uIU/mL  ? Free T4 1.06 0.82 - 1.77 ng/dL  ?VITAMIN D 25 Hydroxy (Vit-D Deficiency, Fractures)     Status: Abnormal  ? Collection Time: 07/07/21  8:02 AM  ?Result Value Ref Range  ? Vit D, 25-Hydroxy 19.3 (L) 30.0 - 100.0 ng/mL  ?  Comment: Vitamin D deficiency has been defined by the Institute of ?Medicine and an Endocrine Society practice guideline as a ?level of serum 25-OH vitamin D less than 20 ng/mL (1,2). ?The Endocrine Society went on to further define vitamin D ?insufficiency as a level between 21 and 29 ng/mL (2). ?1. IOM (Institute of Medicine). 2010. Dietary reference ?   intakes for calcium and D. [place]: The ?   [employer]. ?2. Holick MF, Binkley [place], Bischoff-Ferrari HA, et al. ?   Evaluation, treatment, and prevention of vitamin D ?   deficiency: an Endocrine Society clinical practice ?   guideline. JCEM. 2011 Jul; 96(7):1911-30. ?  ?B12 and Folate Panel     Status: None  ? Collection Time: 07/07/21  8:02 AM  ?Result Value Ref Range  ? Vitamin B-12 1,013 232 - 1,245 pg/mL  ? Folate 14.9 >3.0 ng/mL  ?  Comment: A serum folate concentration of less than 3.1 ng/mL is ?considered to represent clinical deficiency. ?  ? ?No results found. ? ?Assessment/Plan: ? ?1. Hiatal hernia ?61 year old female with recalcitrant reflux causing significant cough from some exacerbation of pulmonary symptoms.  She does have evidence of a hiatal hernia but also has high BMI that technically is a contraindication for antireflux and hiatal hernia repair.  I had an extensive discussion with the patient and the daughter.  I encouraged her by all means to optimize weight.   ?Weight Goal is 250 lbs. I will see her in 6 months ? ? ?Greater than 50% of the 20 minutes  visit was spent in counseling/coordination of care ? ? ?Caroleen Hamman, MD  FACS ?General Surgeon  ?

## 2021-07-12 DIAGNOSIS — I270 Primary pulmonary hypertension: Secondary | ICD-10-CM | POA: Diagnosis not present

## 2021-07-13 DIAGNOSIS — G4733 Obstructive sleep apnea (adult) (pediatric): Secondary | ICD-10-CM | POA: Diagnosis not present

## 2021-07-24 ENCOUNTER — Encounter: Payer: Self-pay | Admitting: Physician Assistant

## 2021-07-24 ENCOUNTER — Ambulatory Visit (INDEPENDENT_AMBULATORY_CARE_PROVIDER_SITE_OTHER): Payer: BC Managed Care – PPO | Admitting: Physician Assistant

## 2021-07-24 ENCOUNTER — Ambulatory Visit: Payer: BC Managed Care – PPO

## 2021-07-24 VITALS — BP 140/76 | HR 75 | Temp 98.7°F | Resp 16 | Ht 66.0 in | Wt 288.0 lb

## 2021-07-24 DIAGNOSIS — K219 Gastro-esophageal reflux disease without esophagitis: Secondary | ICD-10-CM

## 2021-07-24 DIAGNOSIS — K449 Diaphragmatic hernia without obstruction or gangrene: Secondary | ICD-10-CM

## 2021-07-24 DIAGNOSIS — E559 Vitamin D deficiency, unspecified: Secondary | ICD-10-CM

## 2021-07-24 DIAGNOSIS — F411 Generalized anxiety disorder: Secondary | ICD-10-CM | POA: Diagnosis not present

## 2021-07-24 DIAGNOSIS — R300 Dysuria: Secondary | ICD-10-CM | POA: Diagnosis not present

## 2021-07-24 DIAGNOSIS — M150 Primary generalized (osteo)arthritis: Secondary | ICD-10-CM | POA: Diagnosis not present

## 2021-07-24 DIAGNOSIS — G4733 Obstructive sleep apnea (adult) (pediatric): Secondary | ICD-10-CM

## 2021-07-24 DIAGNOSIS — Z6841 Body Mass Index (BMI) 40.0 and over, adult: Secondary | ICD-10-CM

## 2021-07-24 DIAGNOSIS — Z0001 Encounter for general adult medical examination with abnormal findings: Secondary | ICD-10-CM | POA: Diagnosis not present

## 2021-07-24 DIAGNOSIS — I10 Essential (primary) hypertension: Secondary | ICD-10-CM

## 2021-07-24 MED ORDER — FAMOTIDINE 10 MG PO TABS: 10.0000 mg | ORAL_TABLET | Freq: Every day | ORAL | 2 refills | Status: DC

## 2021-07-24 MED ORDER — MELOXICAM 15 MG PO TABS: 15.0000 mg | ORAL_TABLET | Freq: Every day | ORAL | 1 refills | Status: DC | PRN

## 2021-07-24 MED ORDER — ERGOCALCIFEROL 1.25 MG (50000 UT) PO CAPS: ORAL_CAPSULE | ORAL | 3 refills | Status: DC

## 2021-07-24 MED ORDER — FAMOTIDINE 10 MG PO TABS: 10.0000 mg | ORAL_TABLET | Freq: Every day | ORAL | 1 refills | Status: DC

## 2021-07-24 NOTE — Progress Notes (Signed)
[facility] [place], [place] [address]  Internal MEDICINE  Office Visit Note  Patient Name: Shelley [name]  [id number]  [id number]  Date of Service: 07/30/2021  Chief Complaint  Patient presents with   Annual Exam   Results   Anxiety   Anemia   Hypertension    HPI Pt is here for routine follow up -followed by [facility] for hiatal hernia, advised to lose weight prior to surgery -Mammogram in Sept, colonoscopy UTD -acid reflux still bad, will add famotidine -BP stable -using cpap nightly -Reviewed labs showing low vitamin D and will send drisdol, otherwise labs looked good. -last pap was unsatisfactory therefore will need to schedule repeat pap  Current Medication: Outpatient Encounter Medications as of 07/24/2021  Medication Sig   ALPRAZolam (XANAX) 0.25 MG tablet Take half tab twice a day as needed for severe panic attacks   azelastine (ASTELIN) 0.1 % nasal spray Place 2 sprays into both nostrils 2 (two) times daily. Use in each nostril as directed   diclofenac Sodium (VOLTAREN) 1 % GEL SMARTSIG:Gram(s) Topical 4 Times Daily PRN   diltiazem (DILACOR XR) 180 MG 24 hr capsule Take 180 mg by mouth daily.   ergocalciferol (DRISDOL) 1.25 MG (50000 UT) capsule Take one cap q week   escitalopram (LEXAPRO) 10 MG tablet TAKE 1 TABLET BY MOUTH ONCE DAILY WITH  SUPPER  FOR  PANIC  ATTACKS   meclizine (ANTIVERT) 25 MG tablet Take 25 mg by mouth 3 (three) times daily as needed.   Multiple Vitamin (MULTIVITAMIN) tablet Take 1 tablet by mouth daily.   omeprazole (PRILOSEC) 40 MG capsule TAKE 1 CAPSULE BY MOUTH EVERY DAY FOR HEARTBURN (Patient taking differently: Take 40 mg by mouth 2 (two) times daily. TAKE 1 CAPSULE BY MOUTH EVERY DAY FOR HEARTBURN)   [DISCONTINUED] famotidine (PEPCID) 10 MG tablet Take 1 tablet (10 mg total) by mouth daily.   [DISCONTINUED] meloxicam (MOBIC) 15 MG tablet Take 1 tablet (15 mg total) by mouth daily as needed.   famotidine (PEPCID) 10  MG tablet Take 1 tablet (10 mg total) by mouth daily.   meloxicam (MOBIC) 15 MG tablet Take 1 tablet (15 mg total) by mouth daily as needed.   No facility-administered encounter medications on file as of 07/24/2021.    Surgical History: Past Surgical History:  Procedure Laterality Date   COLONOSCOPY WITH PROPOFOL N/A 02/27/2021   Procedure: COLONOSCOPY WITH PROPOFOL;  Surgeon: Lucilla Lame, MD;  [facility] ENDOSCOPY;  Service: Endoscopy;  Laterality: N/A;   ESOPHAGOGASTRODUODENOSCOPY  02/27/2021   Procedure: ESOPHAGOGASTRODUODENOSCOPY (EGD);  Surgeon: Lucilla Lame, MD;  Location: [facility] ENDOSCOPY;  Service: Endoscopy;;   RIGHT/LEFT HEART CATH AND CORONARY ANGIOGRAPHY Bilateral 05/26/2019   Procedure: RIGHT/LEFT HEART CATH AND CORONARY ANGIOGRAPHY;  Surgeon: Nelva Bush, MD;  Location: [place] CV LAB;  Service: Cardiovascular;  Laterality: Bilateral;   TUBAL LIGATION      Medical History: Past Medical History:  Diagnosis Date   Anemia    Anxiety    Arthritis    Cardiomegaly    Family history of breast cancer    Family history of ovarian cancer    5/22 cancer genetic testing letter sent   Hypertension    Hyperthyroidism    Pulmonary hypertension ([place])    Sleep apnea    Thyroid disease    Vertigo     Family History: Family History  Problem Relation Age of Onset   Heart attack Mother    Ovarian cancer Mother  Cancer Father    Cancer Sister    Breast cancer Maternal Grandmother    Breast cancer Cousin        1st maternal cousin   Cancer Other     Social History   Socioeconomic History   Marital status: Single    Spouse name: Not on file   Number of children: Not on file   Years of education: Not on file   Highest education level: Not on file  Occupational History   Not on file  Tobacco Use   Smoking status: Never   Smokeless tobacco: Never  Vaping Use   Vaping Use: Never used  Substance and Sexual Activity   Alcohol use: No   Drug use: No    Sexual activity: Yes    Birth control/protection: I.U.D.  Other Topics Concern   Not on file  Social History Narrative   Not on file   Social Determinants of Health   Financial Resource Strain: Not on file  Food Insecurity: Not on file  Transportation Needs: Not on file  Physical Activity: Not on file  Stress: Not on file  Social Connections: Not on file  Intimate Partner Violence: Not on file      Review of Systems  Constitutional:  Negative for chills, fatigue and unexpected weight change.  HENT:  Positive for postnasal drip. Negative for congestion, rhinorrhea, sneezing and sore throat.   Eyes:  Negative for redness.  Respiratory:  Negative for cough, chest tightness and shortness of breath.   Cardiovascular:  Negative for chest pain and palpitations.  Gastrointestinal:  Negative for abdominal pain, constipation, diarrhea, nausea and vomiting.       GERD  Genitourinary:  Negative for dysuria and frequency.  Musculoskeletal:  Positive for arthralgias. Negative for back pain, joint swelling and neck pain.  Skin:  Negative for rash.  Neurological: Negative.  Negative for tremors and numbness.  Hematological:  Negative for adenopathy. Does not bruise/bleed easily.  Psychiatric/Behavioral:  Negative for behavioral problems (Depression), sleep disturbance and suicidal ideas. The patient is not nervous/anxious.    Vital Signs: BP 140/76   Pulse 75   Temp 98.7 F (37.1 C)   Resp 16   Ht '5\' 6"'$  (1.676 m)   Wt 288 lb (130.6 kg)   SpO2 98%   BMI 46.48 kg/m    Physical Exam Vitals and nursing note reviewed.  Constitutional:      General: She is not in acute distress.    Appearance: She is well-developed. She is obese. She is not diaphoretic.  HENT:     Head: Normocephalic and atraumatic.     Mouth/Throat:     Pharynx: No oropharyngeal exudate.  Eyes:     Pupils: Pupils are equal, round, and reactive to light.  Neck:     Thyroid: No thyromegaly.     Vascular: No JVD.      Trachea: No tracheal deviation.  Cardiovascular:     Rate and Rhythm: Normal rate and regular rhythm.     Heart sounds: Normal heart sounds. No murmur heard.   No friction rub. No gallop.  Pulmonary:     Effort: Pulmonary effort is normal. No respiratory distress.     Breath sounds: No wheezing or rales.  Chest:     Chest wall: No tenderness.  Abdominal:     General: Bowel sounds are normal.     Palpations: Abdomen is soft.     Tenderness: There is no abdominal tenderness.  Musculoskeletal:  General: Normal range of motion.     Cervical back: Normal range of motion and neck supple.  Lymphadenopathy:     Cervical: No cervical adenopathy.  Skin:    General: Skin is warm and dry.  Neurological:     Mental Status: She is alert and oriented to person, place, and time.     Cranial Nerves: No cranial nerve deficit.  Psychiatric:        Behavior: Behavior normal.        Thought Content: Thought content normal.        Judgment: Judgment normal.       Assessment/Plan: 1. Encounter for general adult medical examination with abnormal findings CPE performed, UTD on colonoscopy, will schedule pap for next visit and Mammogram due in Sept  2. Essential hypertension Stable, continue current medication  3. Primary generalized (osteo)arthritis - meloxicam (MOBIC) 15 MG tablet; Take 1 tablet (15 mg total) by mouth daily as needed.  Dispense: 90 tablet; Refill: 1  4. Gastroesophageal reflux disease without esophagitis Will add famotidine and continue omeprazole  5. GAD (generalized anxiety disorder) Stable, continue current medications  6. Hiatal hernia Followed by [facility] and working on weight loss prior to considering surgery  7. Vitamin D deficiency Start drisdol  8. OSA (obstructive sleep apnea) Continue cpap  9. Morbid obesity with BMI of 45.0-49.9, adult ([place]) Obesity Counseling: Had a lengthy discussion regarding patients BMI and weight issues. Patient was  instructed on portion control as well as increased activity. Also discussed caloric restrictions with trying to maintain intake less than 2000 Kcal. Discussions were made in accordance with the 5As of weight management. Simple actions such as not eating late and if able to, taking a walk is suggested.  10. Dysuria - UA/M w/rflx Culture, Routine   General Counseling: Zalea verbalizes understanding of the findings of todays visit and agrees with plan of treatment. I have discussed any further diagnostic evaluation that may be needed or ordered today. We also reviewed her medications today. she has been encouraged to call the office with any questions or concerns that should arise related to todays visit.    Orders Placed This Encounter  Procedures   Microscopic Examination   UA/M w/rflx Culture, Routine    Meds ordered this encounter  Medications   ergocalciferol (DRISDOL) 1.25 MG (50000 UT) capsule    Sig: Take one cap q week    Dispense:  12 capsule    Refill:  3   DISCONTD: famotidine (PEPCID) 10 MG tablet    Sig: Take 1 tablet (10 mg total) by mouth daily.    Dispense:  30 tablet    Refill:  2   meloxicam (MOBIC) 15 MG tablet    Sig: Take 1 tablet (15 mg total) by mouth daily as needed.    Dispense:  90 tablet    Refill:  1   famotidine (PEPCID) 10 MG tablet    Sig: Take 1 tablet (10 mg total) by mouth daily.    Dispense:  90 tablet    Refill:  1    This patient was seen by Drema Dallas, PA-C in collaboration with Dr. Clayborn Bigness as a part of collaborative care agreement.   Total time spent:35 Minutes Time spent includes review of chart, medications, test results, and follow up plan with the patient.      Dr Lavera Guise Internal medicine

## 2021-07-25 LAB — UA/M W/RFLX CULTURE, ROUTINE
Bilirubin, UA: NEGATIVE
Glucose, UA: NEGATIVE
Ketones, UA: NEGATIVE
Leukocytes,UA: NEGATIVE
Nitrite, UA: NEGATIVE
Protein,UA: NEGATIVE
RBC, UA: NEGATIVE
Specific Gravity, UA: 1.012 (ref 1.005–1.030)
Urobilinogen, Ur: 0.2 mg/dL (ref 0.2–1.0)
pH, UA: 6.5 (ref 5.0–7.5)

## 2021-07-25 LAB — MICROSCOPIC EXAMINATION
Bacteria, UA: NONE SEEN
Casts: NONE SEEN /lpf
RBC, Urine: NONE SEEN /hpf (ref 0–2)
WBC, UA: NONE SEEN /hpf (ref 0–5)

## 2021-08-12 DIAGNOSIS — I270 Primary pulmonary hypertension: Secondary | ICD-10-CM | POA: Diagnosis not present

## 2021-08-13 DIAGNOSIS — G4733 Obstructive sleep apnea (adult) (pediatric): Secondary | ICD-10-CM | POA: Diagnosis not present

## 2021-08-27 ENCOUNTER — Ambulatory Visit (INDEPENDENT_AMBULATORY_CARE_PROVIDER_SITE_OTHER): Payer: BC Managed Care – PPO

## 2021-08-27 DIAGNOSIS — E538 Deficiency of other specified B group vitamins: Secondary | ICD-10-CM | POA: Diagnosis not present

## 2021-08-27 MED ORDER — CYANOCOBALAMIN 1000 MCG/ML IJ SOLN
1000.0000 ug | Freq: Once | INTRAMUSCULAR | Status: AC
  Administered 2021-08-27: 1000 ug via INTRAMUSCULAR

## 2021-09-11 DIAGNOSIS — I270 Primary pulmonary hypertension: Secondary | ICD-10-CM | POA: Diagnosis not present

## 2021-09-12 DIAGNOSIS — G4733 Obstructive sleep apnea (adult) (pediatric): Secondary | ICD-10-CM | POA: Diagnosis not present

## 2021-09-24 ENCOUNTER — Ambulatory Visit (INDEPENDENT_AMBULATORY_CARE_PROVIDER_SITE_OTHER): Payer: BC Managed Care – PPO

## 2021-09-24 DIAGNOSIS — E538 Deficiency of other specified B group vitamins: Secondary | ICD-10-CM

## 2021-09-24 DIAGNOSIS — G4733 Obstructive sleep apnea (adult) (pediatric): Secondary | ICD-10-CM

## 2021-09-24 MED ORDER — CYANOCOBALAMIN 1000 MCG/ML IJ SOLN
1000.0000 ug | Freq: Once | INTRAMUSCULAR | Status: AC
  Administered 2021-09-24: 1000 ug via INTRAMUSCULAR

## 2021-09-24 NOTE — Progress Notes (Signed)
95 percentile pressure 7   95th percentile leak 12.2   apnea index 0.1 /hr  apnea-hypopnea index  0.1 /hr   total days used  >4 hr 89 days  total days used <4 hr 0 days  Total compliance 100 percent  She is doing great she did ask about the inspire asked her to do research before deciding No other problems or questions at this time.  Pt was seen by Claiborne Billings  RRT/RCP  from [facility]

## 2021-10-08 ENCOUNTER — Other Ambulatory Visit: Payer: Self-pay | Admitting: Internal Medicine

## 2021-10-09 ENCOUNTER — Telehealth: Payer: Self-pay

## 2021-10-09 ENCOUNTER — Other Ambulatory Visit: Payer: Self-pay

## 2021-10-09 NOTE — Telephone Encounter (Signed)
Lmom to call us back for right dose and who refills this med before

## 2021-10-09 NOTE — Telephone Encounter (Signed)
Lmom to call us regarding med

## 2021-10-10 ENCOUNTER — Telehealth: Payer: Self-pay

## 2021-10-10 NOTE — Telephone Encounter (Signed)
Lmom several message to verify that she still taking  diltiazem (DILACOR XR) 180 MG 24 hr capsule

## 2021-10-10 NOTE — Telephone Encounter (Signed)
Lmom several message to pt  please review

## 2021-10-12 DIAGNOSIS — I270 Primary pulmonary hypertension: Secondary | ICD-10-CM | POA: Diagnosis not present

## 2021-10-13 DIAGNOSIS — G4733 Obstructive sleep apnea (adult) (pediatric): Secondary | ICD-10-CM | POA: Diagnosis not present

## 2021-10-20 ENCOUNTER — Ambulatory Visit: Payer: BC Managed Care – PPO

## 2021-10-20 ENCOUNTER — Ambulatory Visit (INDEPENDENT_AMBULATORY_CARE_PROVIDER_SITE_OTHER): Payer: BC Managed Care – PPO | Admitting: Physician Assistant

## 2021-10-20 ENCOUNTER — Encounter: Payer: Self-pay | Admitting: Physician Assistant

## 2021-10-20 VITALS — BP 136/86 | HR 72 | Temp 98.5°F | Resp 16 | Ht 66.0 in | Wt 287.0 lb

## 2021-10-20 DIAGNOSIS — E538 Deficiency of other specified B group vitamins: Secondary | ICD-10-CM

## 2021-10-20 DIAGNOSIS — I10 Essential (primary) hypertension: Secondary | ICD-10-CM

## 2021-10-20 DIAGNOSIS — Z124 Encounter for screening for malignant neoplasm of cervix: Secondary | ICD-10-CM | POA: Diagnosis not present

## 2021-10-20 DIAGNOSIS — G4733 Obstructive sleep apnea (adult) (pediatric): Secondary | ICD-10-CM | POA: Diagnosis not present

## 2021-10-20 DIAGNOSIS — Z113 Encounter for screening for infections with a predominantly sexual mode of transmission: Secondary | ICD-10-CM | POA: Diagnosis not present

## 2021-10-20 DIAGNOSIS — F411 Generalized anxiety disorder: Secondary | ICD-10-CM

## 2021-10-20 MED ORDER — CYANOCOBALAMIN 1000 MCG/ML IJ SOLN
1000.0000 ug | Freq: Once | INTRAMUSCULAR | Status: AC
  Administered 2021-10-20: 1000 ug via INTRAMUSCULAR

## 2021-10-20 MED ORDER — ALPRAZOLAM 0.25 MG PO TABS: ORAL_TABLET | ORAL | 0 refills | Status: DC

## 2021-10-20 NOTE — Progress Notes (Signed)
[facility] [place], [place] [address]  Internal MEDICINE  Office Visit Note  Patient Name: Shelley [name]  [id number]  [id number]  Date of Service: 10/20/2021   Chief Complaint  Patient presents with   Follow-up   Anxiety   Hypertension     HPI Pt is here for a pap smear only -Her last pap was in 2020 and was neg for HPV, but unsatisfactory and therefore needs to be repeated -She has no concerns today -Does take 1/2 tab xanax when needed, last fill in Dec 2022, does request refill today -using cpap nightly and well controlled.  Current Medication: Outpatient Encounter Medications as of 10/20/2021  Medication Sig   azelastine (ASTELIN) 0.1 % nasal spray Place 2 sprays into both nostrils 2 (two) times daily. Use in each nostril as directed   diclofenac Sodium (VOLTAREN) 1 % GEL SMARTSIG:Gram(s) Topical 4 Times Daily PRN   diltiazem (CARDIZEM CD) 180 MG 24 hr capsule TAKE ONE CAPSULE BY MOUTH DAILY   ergocalciferol (DRISDOL) 1.25 MG (50000 UT) capsule Take one cap q week   escitalopram (LEXAPRO) 10 MG tablet TAKE 1 TABLET BY MOUTH ONCE DAILY WITH  SUPPER  FOR  PANIC  ATTACKS   famotidine (PEPCID) 10 MG tablet Take 1 tablet (10 mg total) by mouth daily.   meclizine (ANTIVERT) 25 MG tablet Take 25 mg by mouth 3 (three) times daily as needed.   meloxicam (MOBIC) 15 MG tablet Take 1 tablet (15 mg total) by mouth daily as needed.   Multiple Vitamin (MULTIVITAMIN) tablet Take 1 tablet by mouth daily.   omeprazole (PRILOSEC) 40 MG capsule TAKE 1 CAPSULE BY MOUTH EVERY DAY FOR HEARTBURN (Patient taking differently: Take 40 mg by mouth 2 (two) times daily. TAKE 1 CAPSULE BY MOUTH EVERY DAY FOR HEARTBURN)   [DISCONTINUED] ALPRAZolam (XANAX) 0.25 MG tablet Take half tab twice a day as needed for severe panic attacks   ALPRAZolam (XANAX) 0.25 MG tablet Take half tab twice a day as needed for severe panic attacks   [EXPIRED] cyanocobalamin (VITAMIN B12)  injection 1,000 mcg    No facility-administered encounter medications on file as of 10/20/2021.    Surgical History: Past Surgical History:  Procedure Laterality Date   COLONOSCOPY WITH PROPOFOL N/A 02/27/2021   Procedure: COLONOSCOPY WITH PROPOFOL;  Surgeon: Lucilla Lame, MD;  Location: [facility] ENDOSCOPY;  Service: Endoscopy;  Laterality: N/A;   ESOPHAGOGASTRODUODENOSCOPY  02/27/2021   Procedure: ESOPHAGOGASTRODUODENOSCOPY (EGD);  Surgeon: Lucilla Lame, MD;  Location: [facility] ENDOSCOPY;  Service: Endoscopy;;   RIGHT/LEFT HEART CATH AND CORONARY ANGIOGRAPHY Bilateral 05/26/2019   Procedure: RIGHT/LEFT HEART CATH AND CORONARY ANGIOGRAPHY;  Surgeon: Nelva Bush, MD;  Location: [facility];  Service: Cardiovascular;  Laterality: Bilateral;   TUBAL LIGATION      Medical History: Past Medical History:  Diagnosis Date   Anemia    Anxiety    Arthritis    Cardiomegaly    Family history of breast cancer    Family history of ovarian cancer    5/22 cancer genetic testing letter sent   Hypertension    Hyperthyroidism    Pulmonary hypertension ([place])    Sleep apnea    Thyroid disease    Vertigo     Family History: Family History  Problem Relation Age of Onset   Heart attack Mother    Ovarian cancer Mother    Cancer Father    Cancer Sister    Breast cancer Maternal Grandmother  Breast cancer Cousin        1st maternal cousin   Cancer Other     Social History   Socioeconomic History   Marital status: Single    Spouse name: Not on file   Number of children: Not on file   Years of education: Not on file   Highest education level: Not on file  Occupational History   Not on file  Tobacco Use   Smoking status: Never   Smokeless tobacco: Never  Vaping Use   Vaping Use: Never used  Substance and Sexual Activity   Alcohol use: No   Drug use: No   Sexual activity: Yes    Birth control/protection: I.U.D.  Other Topics Concern   Not on file  Social History Narrative    Not on file   Social Determinants of Health   Financial Resource Strain: Not on file  Food Insecurity: Not on file  Transportation Needs: Not on file  Physical Activity: Not on file  Stress: Not on file  Social Connections: Not on file  Intimate Partner Violence: Not on file     Review of Systems  Constitutional:  Negative for chills, fatigue and unexpected weight change.  HENT:  Negative for congestion, rhinorrhea, sneezing and sore throat.   Eyes:  Negative for redness.  Respiratory:  Negative for cough, chest tightness and shortness of breath.   Cardiovascular:  Negative for chest pain and palpitations.  Gastrointestinal:  Negative for abdominal pain, constipation, diarrhea, nausea and vomiting.       GERD  Genitourinary:  Negative for dysuria and frequency.  Musculoskeletal:  Positive for arthralgias. Negative for back pain, joint swelling and neck pain.  Skin:  Negative for rash.  Neurological: Negative.  Negative for tremors and numbness.  Hematological:  Negative for adenopathy. Does not bruise/bleed easily.  Psychiatric/Behavioral:  Negative for behavioral problems (Depression), sleep disturbance and suicidal ideas. The patient is not nervous/anxious.      Vital signs: BP 136/86 Comment: 130/92  Pulse 72   Temp 98.5 F (36.9 C)   Resp 16   Ht '5\' 6"'$  (1.676 m)   Wt 287 lb (130.2 kg)   SpO2 99%   BMI 46.32 kg/m    Physical Exam Vitals and nursing note reviewed. Exam conducted with a chaperone present.  Constitutional:      General: She is not in acute distress.    Appearance: Normal appearance. She is well-developed. She is obese. She is not diaphoretic.  HENT:     Head: Normocephalic and atraumatic.     Mouth/Throat:     Pharynx: No oropharyngeal exudate.  Eyes:     Pupils: Pupils are equal, round, and reactive to light.  Neck:     Thyroid: No thyromegaly.     Vascular: No JVD.     Trachea: No tracheal deviation.  Cardiovascular:     Rate and  Rhythm: Normal rate and regular rhythm.     Heart sounds: Normal heart sounds. No murmur heard.    No friction rub. No gallop.  Pulmonary:     Effort: Pulmonary effort is normal. No respiratory distress.     Breath sounds: No wheezing or rales.  Chest:     Chest wall: No tenderness.  Abdominal:     General: Bowel sounds are normal.     Palpations: Abdomen is soft.     Tenderness: There is no abdominal tenderness.  Genitourinary:    Exam position: Lithotomy position.     Comments:  Pap performed Musculoskeletal:        General: Normal range of motion.     Cervical back: Normal range of motion and neck supple.  Lymphadenopathy:     Cervical: No cervical adenopathy.  Skin:    General: Skin is warm and dry.  Neurological:     Mental Status: She is alert and oriented to person, place, and time.     Cranial Nerves: No cranial nerve deficit.  Psychiatric:        Behavior: Behavior normal.        Thought Content: Thought content normal.        Judgment: Judgment normal.       Assessment/Plan: 1. Essential hypertension Well controlled, continue current medications  2. Routine cervical smear - IGP, Aptima HPV  3. Screening for STDs (sexually transmitted diseases) - NuSwab Vaginitis Plus (VG+)  4. GAD (generalized anxiety disorder) May take 1/2 tab xanax as needed - ALPRAZolam (XANAX) 0.25 MG tablet; Take half tab twice a day as needed for severe panic attacks  Dispense: 30 tablet; Refill: 0  5. Obstructive sleep apnea Continue cpap  6. B12 deficiency - cyanocobalamin (VITAMIN B12) injection 1,000 mcg   General Counseling: Matina verbalizes understanding of the findings of todays visit and agrees with plan of treatment. I have discussed any further diagnostic evaluation that may be needed or ordered today. We also reviewed her medications today. she has been encouraged to call the office with any questions or concerns that should arise related to todays  visit.    Counseling:    Orders Placed This Encounter  Procedures   NuSwab Vaginitis Plus (VG+)    Meds ordered this encounter  Medications   cyanocobalamin (VITAMIN B12) injection 1,000 mcg   ALPRAZolam (XANAX) 0.25 MG tablet    Sig: Take half tab twice a day as needed for severe panic attacks    Dispense:  30 tablet    Refill:  0    Time spent:30 Minutes

## 2021-10-22 ENCOUNTER — Ambulatory Visit: Payer: BC Managed Care – PPO

## 2021-10-22 LAB — NUSWAB VAGINITIS PLUS (VG+)
Candida albicans, NAA: NEGATIVE
Candida glabrata, NAA: NEGATIVE
Chlamydia trachomatis, NAA: NEGATIVE
Neisseria gonorrhoeae, NAA: NEGATIVE
Trich vag by NAA: NEGATIVE

## 2021-10-27 LAB — IGP, APTIMA HPV: HPV Aptima: NEGATIVE

## 2021-11-04 ENCOUNTER — Telehealth: Payer: Self-pay

## 2021-11-04 NOTE — Telephone Encounter (Signed)
LMOM for pt to return my call about her labs (PAP results)  Per Ander Purpura  Let pt know that her pap showed ascus but her hpv was negative therefore will monitor and plan to recheck in 3 years unless problem before then.

## 2021-11-12 DIAGNOSIS — I270 Primary pulmonary hypertension: Secondary | ICD-10-CM | POA: Diagnosis not present

## 2021-11-13 DIAGNOSIS — G4733 Obstructive sleep apnea (adult) (pediatric): Secondary | ICD-10-CM | POA: Diagnosis not present

## 2021-11-26 ENCOUNTER — Ambulatory Visit (INDEPENDENT_AMBULATORY_CARE_PROVIDER_SITE_OTHER): Payer: BC Managed Care – PPO

## 2021-11-26 DIAGNOSIS — E538 Deficiency of other specified B group vitamins: Secondary | ICD-10-CM | POA: Diagnosis not present

## 2021-11-26 MED ORDER — CYANOCOBALAMIN 1000 MCG/ML IJ SOLN
1000.0000 ug | Freq: Once | INTRAMUSCULAR | Status: AC
  Administered 2021-11-26: 1000 ug via INTRAMUSCULAR

## 2021-12-12 DIAGNOSIS — I270 Primary pulmonary hypertension: Secondary | ICD-10-CM | POA: Diagnosis not present

## 2021-12-13 DIAGNOSIS — G4733 Obstructive sleep apnea (adult) (pediatric): Secondary | ICD-10-CM | POA: Diagnosis not present

## 2021-12-24 ENCOUNTER — Ambulatory Visit (INDEPENDENT_AMBULATORY_CARE_PROVIDER_SITE_OTHER): Payer: BC Managed Care – PPO

## 2021-12-24 DIAGNOSIS — E538 Deficiency of other specified B group vitamins: Secondary | ICD-10-CM | POA: Diagnosis not present

## 2021-12-24 MED ORDER — CYANOCOBALAMIN 1000 MCG/ML IJ SOLN
1000.0000 ug | Freq: Once | INTRAMUSCULAR | Status: AC
  Administered 2021-12-24: 1000 ug via INTRAMUSCULAR

## 2022-01-12 ENCOUNTER — Ambulatory Visit: Payer: BC Managed Care – PPO | Admitting: Surgery

## 2022-01-12 DIAGNOSIS — I270 Primary pulmonary hypertension: Secondary | ICD-10-CM | POA: Diagnosis not present

## 2022-01-21 ENCOUNTER — Ambulatory Visit (INDEPENDENT_AMBULATORY_CARE_PROVIDER_SITE_OTHER): Payer: BC Managed Care – PPO

## 2022-01-21 DIAGNOSIS — E538 Deficiency of other specified B group vitamins: Secondary | ICD-10-CM | POA: Diagnosis not present

## 2022-01-21 MED ORDER — CYANOCOBALAMIN 1000 MCG/ML IJ SOLN
1000.0000 ug | Freq: Once | INTRAMUSCULAR | Status: AC
  Administered 2022-01-21: 1000 ug via INTRAMUSCULAR

## 2022-01-26 ENCOUNTER — Other Ambulatory Visit: Payer: Self-pay

## 2022-01-26 ENCOUNTER — Encounter: Payer: Self-pay | Admitting: Surgery

## 2022-01-26 ENCOUNTER — Ambulatory Visit: Payer: BC Managed Care – PPO | Admitting: Surgery

## 2022-01-26 VITALS — BP 135/85 | HR 74 | Temp 98.1°F

## 2022-01-26 DIAGNOSIS — K449 Diaphragmatic hernia without obstruction or gangrene: Secondary | ICD-10-CM

## 2022-01-26 NOTE — Patient Instructions (Addendum)
Follow up here in 6 months. We will send you a letter about this appointment.   Hiatal Hernia  A hiatal hernia occurs when part of the stomach slides above the muscle that separates the abdomen from the chest (diaphragm). A person can be born with a hiatal hernia (congenital), or it may develop over time. In almost all cases of hiatal hernia, only the top part of the stomach pushes through the diaphragm. Many people have a hiatal hernia with no symptoms. The larger the hernia, the more likely it is that you will have symptoms. In some cases, a hiatal hernia allows stomach acid to flow back into the tube that carries food from your mouth to your stomach (esophagus). This may cause heartburn symptoms. The development of heartburn symptoms may mean that you have a condition called gastroesophageal reflux disease (GERD). What are the causes? This condition is caused by a weakness in the opening (hiatus) where the esophagus passes through the diaphragm to attach to the upper part of the stomach. A person may be born with a weakness in the hiatus, or a weakness can develop over time. What increases the risk? This condition is more likely to develop in: Older people. Age is a major risk factor for a hiatal hernia, especially if you are over the age of 72. Pregnant women. People who are overweight. People who have frequent constipation. What are the signs or symptoms? Symptoms of this condition usually develop in the form of GERD symptoms. Symptoms include: Heartburn. Upset stomach (indigestion). Trouble swallowing. Coughing or wheezing. Wheezing is making high-pitched whistling sounds when you breathe. Sore throat. Chest pain. Nausea and vomiting. How is this diagnosed? This condition may be diagnosed during testing for GERD. Tests that may be done include: X-rays of your stomach or chest. An upper gastrointestinal (GI) series. This is an X-ray exam of your GI tract that is taken after you swallow  a chalky liquid that shows up clearly on the X-ray. Endoscopy. This is a procedure to look into your stomach using a thin, flexible tube that has a tiny camera and light on the end of it. How is this treated? This condition may be treated by: Dietary and lifestyle changes to help reduce GERD symptoms. Medicines. These may include: Over-the-counter antacids. Medicines that make your stomach empty more quickly. Medicines that block the production of stomach acid (H2 blockers). Stronger medicines to reduce stomach acid (proton pump inhibitors). Surgery to repair the hernia, if other treatments are not helping. If you have no symptoms, you may not need treatment. Follow these instructions at home: Lifestyle and activity Do not use any products that contain nicotine or tobacco. These products include cigarettes, chewing tobacco, and vaping devices, such as e-cigarettes. If you need help quitting, ask your health care provider. Try to achieve and maintain a healthy body weight. Avoid putting pressure on your abdomen. Anything that puts pressure on your abdomen increases the amount of acid that may be pushed up into your esophagus. Avoid bending over, especially after eating. Raise the head of your bed by putting blocks under the legs. This keeps your head and esophagus higher than your stomach. Do not wear tight clothing around your chest or stomach. Try not to strain when having a bowel movement, when urinating, or when lifting heavy objects. Eating and drinking Avoid foods that can worsen GERD symptoms. These may include: Fatty foods, like fried foods. Citrus fruits, like oranges or lemon. Other foods and drinks that contain acid, like  orange juice or tomatoes. Spicy food. Chocolate. Eat frequent small meals instead of three large meals a day. This helps prevent your stomach from getting too full. Eat slowly. Do not lie down right after eating. Do not eat 1-2 hours before bed. Do not  drink beverages with caffeine. These include cola, coffee, cocoa, and tea. Do not drink alcohol. General instructions Take over-the-counter and prescription medicines only as told by your health care provider. Keep all follow-up visits. Your health care provider will want to check that any new prescribed medicines are helping your symptoms. Contact a health care provider if: Your symptoms are not controlled with medicines or lifestyle changes. You are having trouble swallowing. You have coughing or wheezing that will not go away. Your pain is getting worse. Your pain spreads to your arms, neck, jaw, teeth, or back. You feel nauseous or you vomit. Get help right away if: You have shortness of breath. You vomit blood. You have bright red blood in your stools. You have black, tarry stools. These symptoms may be an emergency. Get help right away. Call 911. Do not wait to see if the symptoms will go away. Do not drive yourself to the hospital. Summary A hiatal hernia occurs when part of the stomach slides above the muscle that separates the abdomen from the chest. A person may be born with a weakness in the hiatus, or a weakness can develop over time. Symptoms of a hiatal hernia may include heartburn, trouble swallowing, or sore throat. Management of a hiatal hernia includes eating frequent small meals instead of three large meals a day. Get help right away if you vomit blood, have bright red blood in your stools, or have black, tarry stools. This information is not intended to replace advice given to you by your health care provider. Make sure you discuss any questions you have with your health care provider. Document Revised: 04/22/2021 Document Reviewed: 04/22/2021 Elsevier Patient Education  [place].

## 2022-01-27 ENCOUNTER — Encounter: Payer: Self-pay | Admitting: Surgery

## 2022-01-27 NOTE — Progress Notes (Signed)
Outpatient Surgical Follow Up  01/27/2022  Shelley [name] is an 61 y.o. female.   Chief Complaint  Patient presents with   Follow-up    Hiatal hernia     HPI: Shelley [name] is a 61 y.o. female following for [facility] and GERD.She continues to endorse GERD but has improved using PPI. She does not have any dysphagia.   She has lost another 2 lbs since our last visit. Marland Kitchen No fevers no chills.   She has not had much time for exercise, she states she feels better from GERD perspective I have also offer her the option for referral to bariatric surgery and she is politely declining  CT and swallow pers reviewed showing small HH w reflux  WEIGHT 285 lbs  Past Medical History:  Diagnosis Date   Anemia    Anxiety    Arthritis    Cardiomegaly    Family history of breast cancer    Family history of ovarian cancer    5/22 cancer genetic testing letter sent   Hypertension    Hyperthyroidism    Pulmonary hypertension ([place])    Sleep apnea    Thyroid disease    Vertigo     Past Surgical History:  Procedure Laterality Date   COLONOSCOPY WITH PROPOFOL N/A 02/27/2021   Procedure: COLONOSCOPY WITH PROPOFOL;  Surgeon: Lucilla Lame, MD;  Location: [facility] ENDOSCOPY;  Service: Endoscopy;  Laterality: N/A;   ESOPHAGOGASTRODUODENOSCOPY  02/27/2021   Procedure: ESOPHAGOGASTRODUODENOSCOPY (EGD);  Surgeon: Lucilla Lame, MD;  Location: [facility] ENDOSCOPY;  Service: Endoscopy;;   RIGHT/LEFT HEART CATH AND CORONARY ANGIOGRAPHY Bilateral 05/26/2019   Procedure: RIGHT/LEFT HEART CATH AND CORONARY ANGIOGRAPHY;  Surgeon: Nelva Bush, MD;  Location: [facility];  Service: Cardiovascular;  Laterality: Bilateral;   TUBAL LIGATION      Family History  Problem Relation Age of Onset   Heart attack Mother    Ovarian cancer Mother    Cancer Father    Cancer Sister    Breast cancer Maternal Grandmother    Breast cancer Cousin        1st maternal cousin   Cancer Other     Social History:  reports  that she has never smoked. She has never used smokeless tobacco. She reports that she does not drink alcohol and does not use drugs.  Allergies:  Allergies  Allergen Reactions   Amlodipine     Medications reviewed.    ROS Full ROS performed and is otherwise negative other than what is stated in HPI   BP 135/85   Pulse 74   Temp 98.1 F (36.7 C) (Oral)   Physical Exam Vitals and nursing note reviewed. Exam conducted with a chaperone present.  Constitutional:      General: She is not in acute distress.    Appearance: Normal appearance. She is obese. She is not ill-appearing.  Eyes:     General: No scleral icterus.       Right eye: No discharge.        Left eye: No discharge.  Cardiovascular:     Rate and Rhythm: Normal rate and regular rhythm.  Pulmonary:     Effort: Pulmonary effort is normal. No respiratory distress.     Breath sounds: Normal breath sounds. No stridor.  Abdominal:     General: Abdomen is flat. There is no distension.     Palpations: Abdomen is soft. There is no mass.     Tenderness: There is no abdominal tenderness. There is  no guarding or rebound.     Hernia: No hernia is present.  Musculoskeletal:        General: Normal range of motion.     Cervical back: Normal range of motion and neck supple. No rigidity or tenderness.  Skin:    General: Skin is warm and dry.     Capillary Refill: Capillary refill takes less than 2 seconds.  Neurological:     General: No focal deficit present.     Mental Status: She is alert and oriented to person, place, and time.  Psychiatric:        Mood and Affect: Mood normal.        Behavior: Behavior normal.        Thought Content: Thought content normal.        Judgment: Judgment normal.      Assessment/Plan: 61 year old female with recalcitrant reflux causing significant cough from some exacerbation of pulmonary symptoms.  She does have evidence of a hiatal hernia but also has high BMI that technically is a  contraindication for antireflux and hiatal hernia repair.  I had an extensive discussion with the patient and the daughter about my thought process, options from bariatrics, to medical rx. She wishes to continue exercise and diet in an attempt to optimize her weight prior to potential fundoplications..  She wishes to see me in 6 more months   Please note that I spent 30 minutes in this encounter including personally reviewing imaging studies, coordinating her care, placing orders and performing appropriate  documentation   Caroleen Hamman, MD [place] Surgeon

## 2022-02-16 ENCOUNTER — Ambulatory Visit: Payer: BC Managed Care – PPO | Admitting: Physician Assistant

## 2022-02-16 ENCOUNTER — Encounter: Payer: Self-pay | Admitting: Physician Assistant

## 2022-02-16 VITALS — BP 136/82 | HR 84 | Temp 96.9°F | Resp 16 | Ht 66.0 in | Wt 287.0 lb

## 2022-02-16 DIAGNOSIS — I10 Essential (primary) hypertension: Secondary | ICD-10-CM | POA: Diagnosis not present

## 2022-02-16 DIAGNOSIS — G4733 Obstructive sleep apnea (adult) (pediatric): Secondary | ICD-10-CM | POA: Diagnosis not present

## 2022-02-16 DIAGNOSIS — K219 Gastro-esophageal reflux disease without esophagitis: Secondary | ICD-10-CM | POA: Diagnosis not present

## 2022-02-16 NOTE — Progress Notes (Signed)
[facility] [place], [place] [address]  Internal MEDICINE  Office Visit Note  Patient Name: Shelley [name]  [id number]  [id number]  Date of Service: 02/16/2022  Chief Complaint  Patient presents with   Follow-up    Discuss more b12    HPI Pt is here for routine follow up -BP at home not checked, takes diltiazem at night -Will discuss changing mask for possible FF mask at sleep clinic visit in Jan as she doesn't like the nasal pillows and thinks she breathes through her mouth at night. She doesn't want to be in FF but will consider it or at least alternative nasal for comfort. Also discussed increasing humidity. -Did have GS follow up for hernia and reflux concerns. He discussed need for her to lose weight prior to intervention and she is working on this with diet and exercise. GERD does seem to be better on her medications now. -B12 replacement for more than 6 months now and she is feeling well, but interested in stopping injections. Will hold for now and could switch to oral B12. Will plan to recheck labs in spring.  Current Medication: Outpatient Encounter Medications as of 02/16/2022  Medication Sig   ALPRAZolam (XANAX) 0.25 MG tablet Take half tab twice a day as needed for severe panic attacks   azelastine (ASTELIN) 0.1 % nasal spray Place 2 sprays into both nostrils 2 (two) times daily. Use in each nostril as directed   diclofenac Sodium (VOLTAREN) 1 % GEL SMARTSIG:Gram(s) Topical 4 Times Daily PRN   diltiazem (CARDIZEM CD) 180 MG 24 hr capsule TAKE ONE CAPSULE BY MOUTH DAILY   ergocalciferol (DRISDOL) 1.25 MG (50000 UT) capsule Take one cap q week   escitalopram (LEXAPRO) 10 MG tablet TAKE 1 TABLET BY MOUTH ONCE DAILY WITH  SUPPER  FOR  PANIC  ATTACKS   famotidine (PEPCID) 10 MG tablet Take 1 tablet (10 mg total) by mouth daily.   meclizine (ANTIVERT) 25 MG tablet Take 25 mg by mouth 3 (three) times daily as needed.   meloxicam (MOBIC) 15 MG  tablet Take 1 tablet (15 mg total) by mouth daily as needed.   Multiple Vitamin (MULTIVITAMIN) tablet Take 1 tablet by mouth daily.   omeprazole (PRILOSEC) 40 MG capsule TAKE 1 CAPSULE BY MOUTH EVERY DAY FOR HEARTBURN (Patient taking differently: Take 40 mg by mouth 2 (two) times daily. TAKE 1 CAPSULE BY MOUTH EVERY DAY FOR HEARTBURN)   No facility-administered encounter medications on file as of 02/16/2022.    Surgical History: Past Surgical History:  Procedure Laterality Date   COLONOSCOPY WITH PROPOFOL N/A 02/27/2021   Procedure: COLONOSCOPY WITH PROPOFOL;  Surgeon: Lucilla Lame, MD;  Location: [facility] ENDOSCOPY;  Service: Endoscopy;  Laterality: N/A;   ESOPHAGOGASTRODUODENOSCOPY  02/27/2021   Procedure: ESOPHAGOGASTRODUODENOSCOPY (EGD);  Surgeon: Lucilla Lame, MD;  Location: [facility] ENDOSCOPY;  Service: Endoscopy;;   RIGHT/LEFT HEART CATH AND CORONARY ANGIOGRAPHY Bilateral 05/26/2019   Procedure: RIGHT/LEFT HEART CATH AND CORONARY ANGIOGRAPHY;  Surgeon: Nelva Bush, MD;  Location: [facility];  Service: Cardiovascular;  Laterality: Bilateral;   TUBAL LIGATION      Medical History: Past Medical History:  Diagnosis Date   Anemia    Anxiety    Arthritis    Cardiomegaly    Family history of breast cancer    Family history of ovarian cancer    5/22 cancer genetic testing letter sent   Hypertension    Hyperthyroidism    Pulmonary hypertension ([place])  Sleep apnea    Thyroid disease    Vertigo     Family History: Family History  Problem Relation Age of Onset   Heart attack Mother    Ovarian cancer Mother    Cancer Father    Cancer Sister    Breast cancer Maternal Grandmother    Breast cancer Cousin        1st maternal cousin   Cancer Other     Social History   Socioeconomic History   Marital status: Single    Spouse name: Not on file   Number of children: Not on file   Years of education: Not on file   Highest education level: Not on file  Occupational  History   Not on file  Tobacco Use   Smoking status: Never   Smokeless tobacco: Never  Vaping Use   Vaping Use: Never used  Substance and Sexual Activity   Alcohol use: No   Drug use: No   Sexual activity: Yes    Birth control/protection: I.U.D.  Other Topics Concern   Not on file  Social History Narrative   Not on file   Social Determinants of Health   Financial Resource Strain: Not on file  Food Insecurity: Not on file  Transportation Needs: Not on file  Physical Activity: Not on file  Stress: Not on file  Social Connections: Not on file  Intimate Partner Violence: Not on file      Review of Systems  Constitutional:  Negative for chills, fatigue and unexpected weight change.  HENT:  Negative for congestion, rhinorrhea, sneezing and sore throat.   Eyes:  Negative for redness.  Respiratory:  Negative for cough, chest tightness and shortness of breath.   Cardiovascular:  Negative for chest pain and palpitations.  Gastrointestinal:  Negative for abdominal pain, constipation, diarrhea, nausea and vomiting.       GERD  Genitourinary:  Negative for dysuria and frequency.  Musculoskeletal:  Positive for arthralgias. Negative for back pain, joint swelling and neck pain.  Skin:  Negative for rash.  Neurological: Negative.  Negative for tremors and numbness.  Hematological:  Negative for adenopathy. Does not bruise/bleed easily.  Psychiatric/Behavioral:  Negative for behavioral problems (Depression), sleep disturbance and suicidal ideas. The patient is not nervous/anxious.     Vital Signs: BP 136/82 Comment: 151/89  Pulse 84   Temp (!) 96.9 F (36.1 C)   Resp 16   Ht '5\' 6"'$  (1.676 m)   Wt 287 lb (130.2 kg)   SpO2 98%   BMI 46.32 kg/m    Physical Exam Vitals and nursing note reviewed.  Constitutional:      General: She is not in acute distress.    Appearance: She is well-developed. She is obese. She is not diaphoretic.  HENT:     Head: Normocephalic and  atraumatic.     Mouth/Throat:     Pharynx: No oropharyngeal exudate.  Eyes:     Pupils: Pupils are equal, round, and reactive to light.  Neck:     Thyroid: No thyromegaly.     Vascular: No JVD.     Trachea: No tracheal deviation.  Cardiovascular:     Rate and Rhythm: Normal rate and regular rhythm.     Heart sounds: Normal heart sounds. No murmur heard.    No friction rub. No gallop.  Pulmonary:     Effort: Pulmonary effort is normal. No respiratory distress.     Breath sounds: No wheezing or rales.  Chest:  Chest wall: No tenderness.  Abdominal:     General: Bowel sounds are normal.     Palpations: Abdomen is soft.  Musculoskeletal:        General: Normal range of motion.     Cervical back: Normal range of motion and neck supple.  Lymphadenopathy:     Cervical: No cervical adenopathy.  Skin:    General: Skin is warm and dry.  Neurological:     Mental Status: She is alert and oriented to person, place, and time.     Cranial Nerves: No cranial nerve deficit.  Psychiatric:        Behavior: Behavior normal.        Thought Content: Thought content normal.        Judgment: Judgment normal.        Assessment/Plan: 1. Essential hypertension Stable, continue current medications  2. Obstructive sleep apnea Continue cpap nightly and discuss alt mask at sleep clinic visit. Followed by pulmonology  3. Gastroesophageal reflux disease without esophagitis Continue current medications and follow up with GS for hernia monitoring   General Counseling: Shamell verbalizes understanding of the findings of todays visit and agrees with plan of treatment. I have discussed any further diagnostic evaluation that may be needed or ordered today. We also reviewed her medications today. she has been encouraged to call the office with any questions or concerns that should arise related to todays visit.    No orders of the defined types were placed in this encounter.   No orders of the  defined types were placed in this encounter.   This patient was seen by Drema [place], PA-C in collaboration with Dr. Clayborn Bigness as a part of collaborative care agreement.   Total time spent:30 Minutes Time spent includes review of chart, medications, test results, and follow up plan with the patient.      Dr Lavera Guise Internal medicine

## 2022-02-17 DIAGNOSIS — G4733 Obstructive sleep apnea (adult) (pediatric): Secondary | ICD-10-CM | POA: Diagnosis not present

## 2022-02-26 ENCOUNTER — Telehealth: Payer: Self-pay | Admitting: Physician Assistant

## 2022-02-26 DIAGNOSIS — R21 Rash and other nonspecific skin eruption: Secondary | ICD-10-CM | POA: Diagnosis not present

## 2022-02-26 DIAGNOSIS — L03115 Cellulitis of right lower limb: Secondary | ICD-10-CM | POA: Diagnosis not present

## 2022-02-26 NOTE — Telephone Encounter (Signed)
122/12/23 pap supply  order signed. Faxed back o [facility]

## 2022-03-12 IMAGING — MG MM DIGITAL SCREENING BILAT W/ TOMO AND CAD
6 of 10 series · 6 of 30 positions shown · non-contrast
Comparison: Previous exam(s).

CLINICAL DATA: Screening.

EXAM:
DIGITAL SCREENING BILATERAL MAMMOGRAM WITH TOMOSYNTHESIS AND CAD
TECHNIQUE: Bilateral screening digital craniocaudal and mediolateral oblique
mammograms were obtained. Bilateral screening digital breast
tomosynthesis was performed. The images were evaluated with
computer-aided detection.

[R MLO synth-2D]
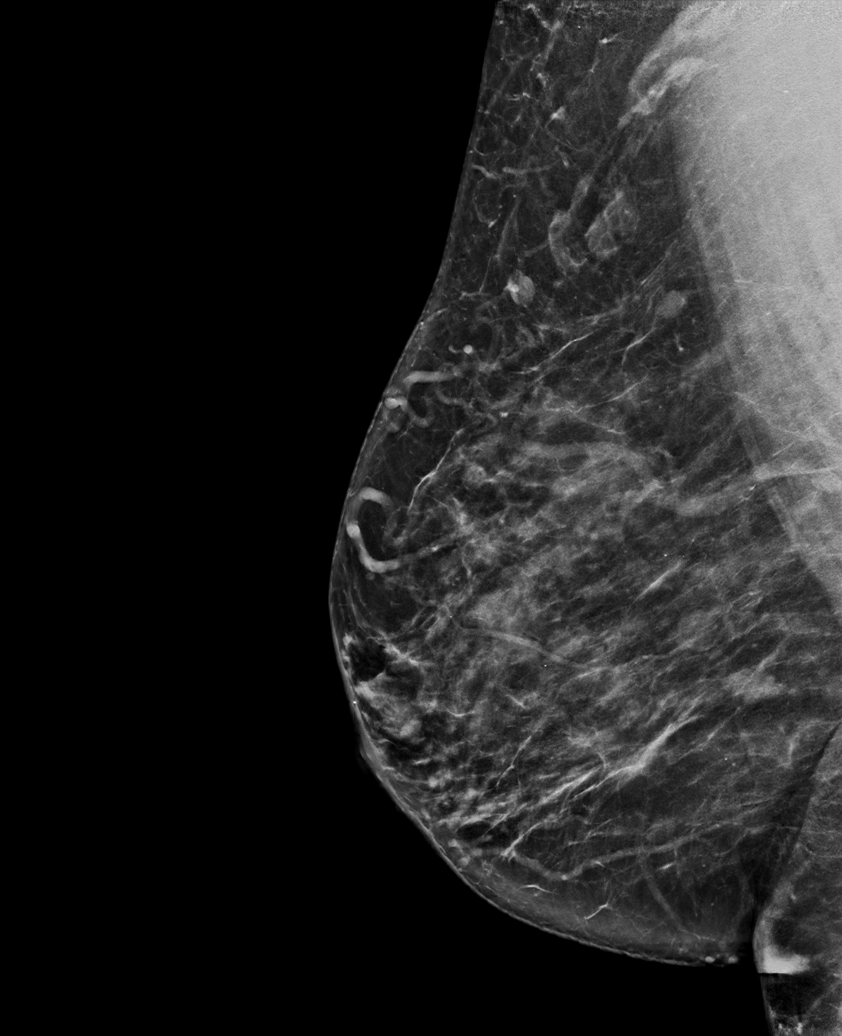

[L MLO synth-2D]
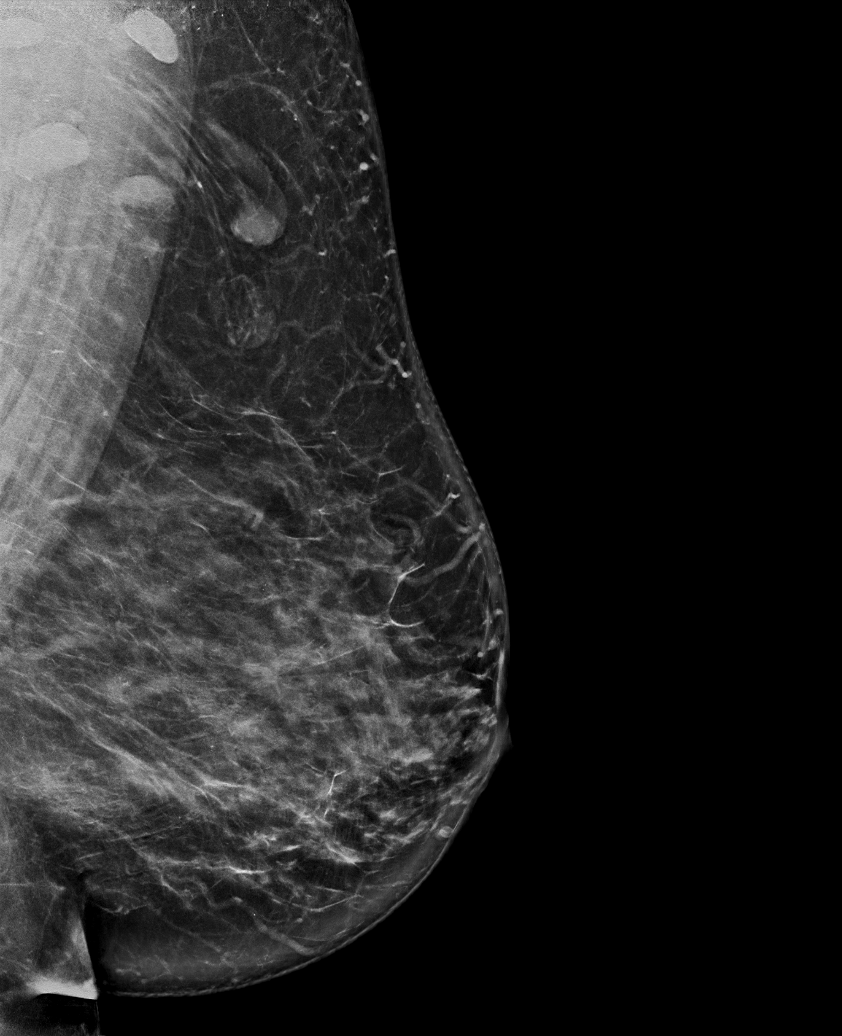

[R CC synth-2D]
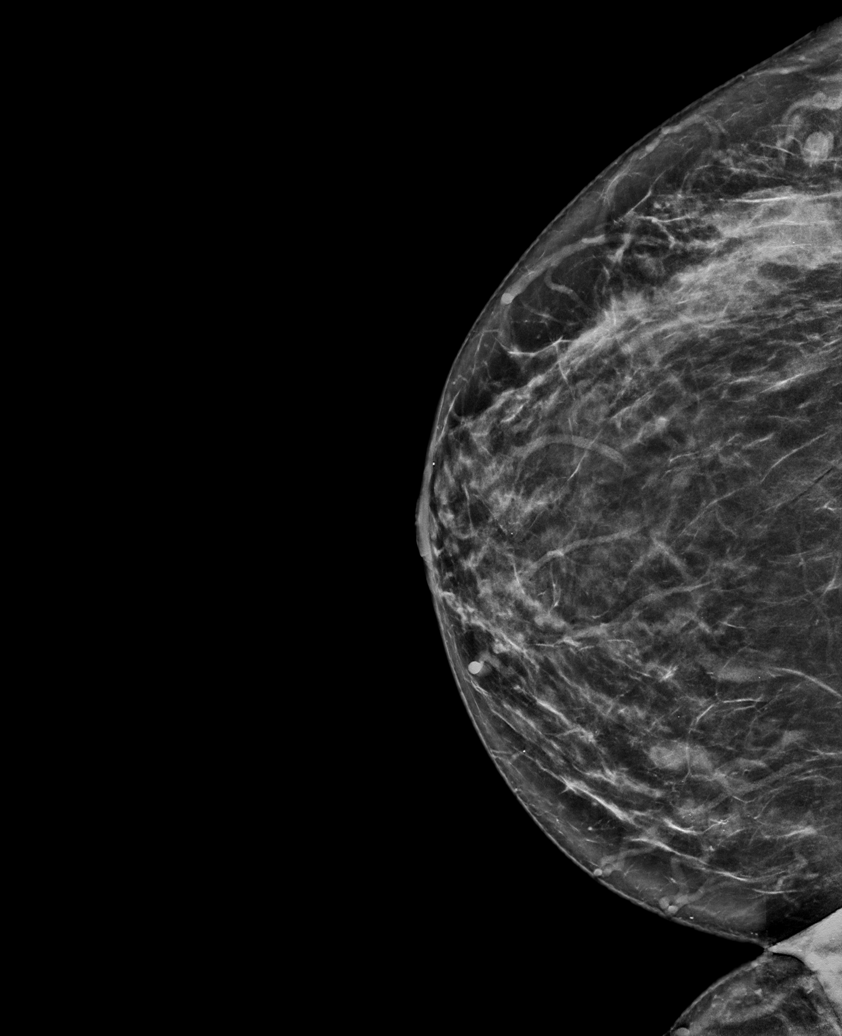

[L CC synth-2D]
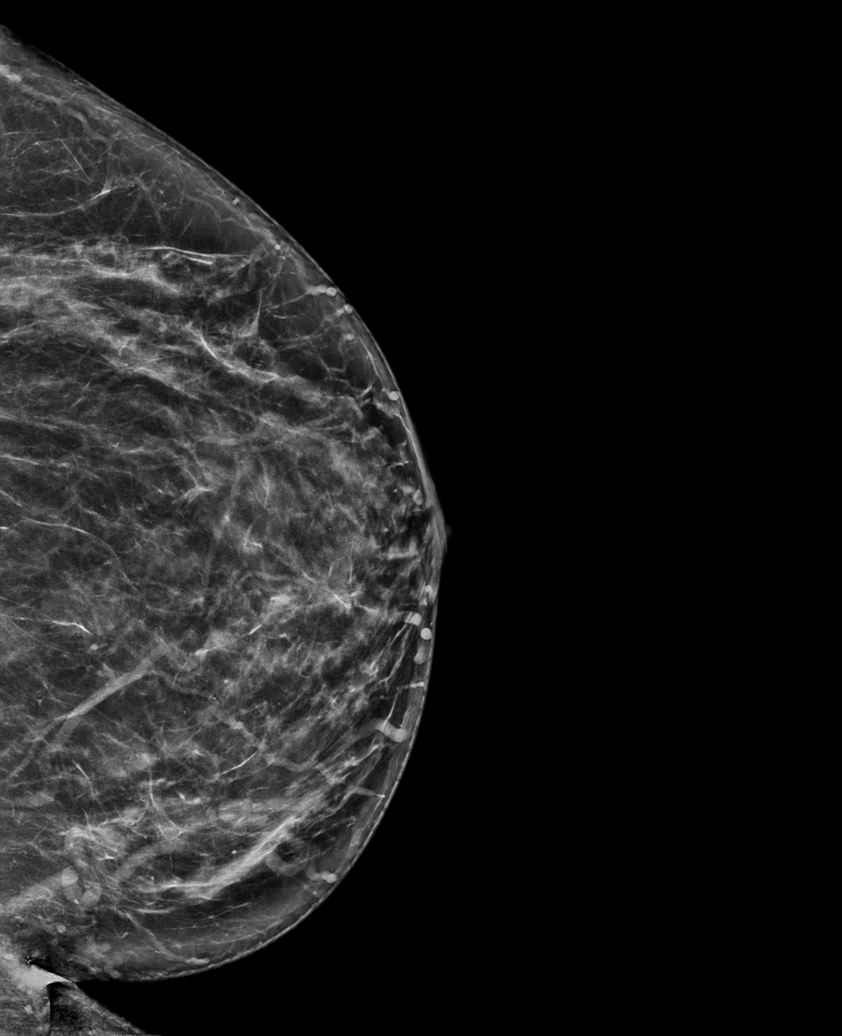

[R XCCL synth-2D]
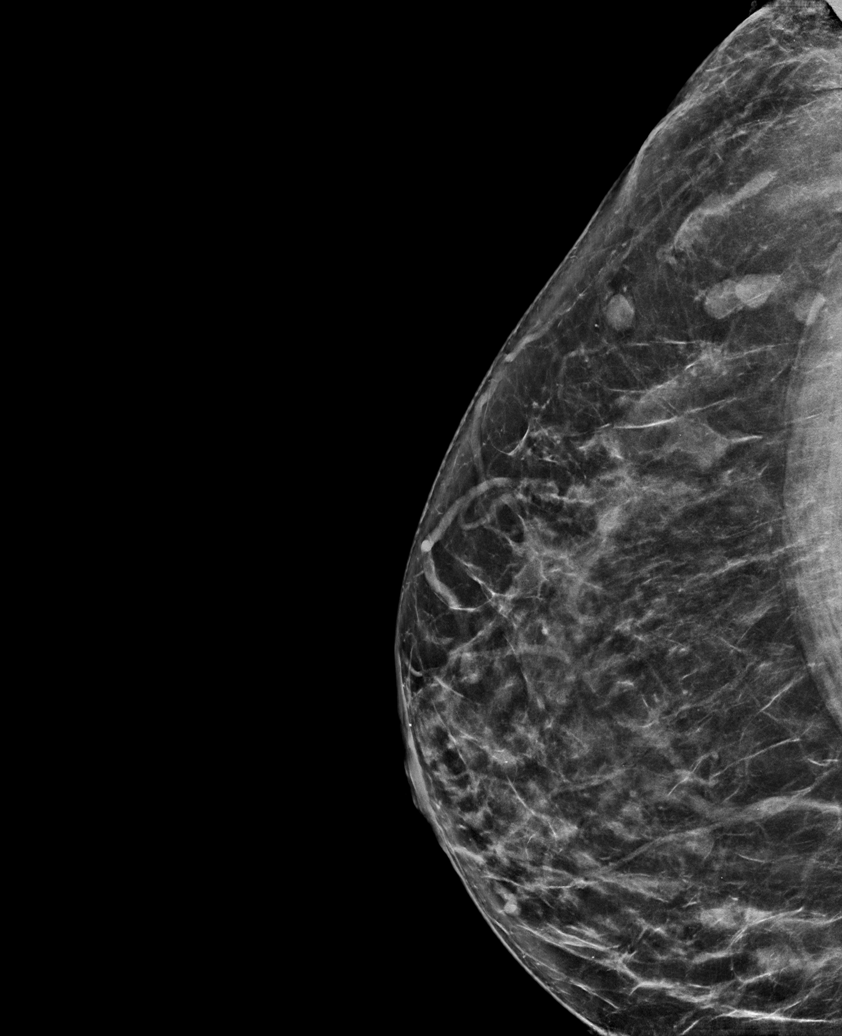

[R XCCL tomo · tomo slice 41/82.0]
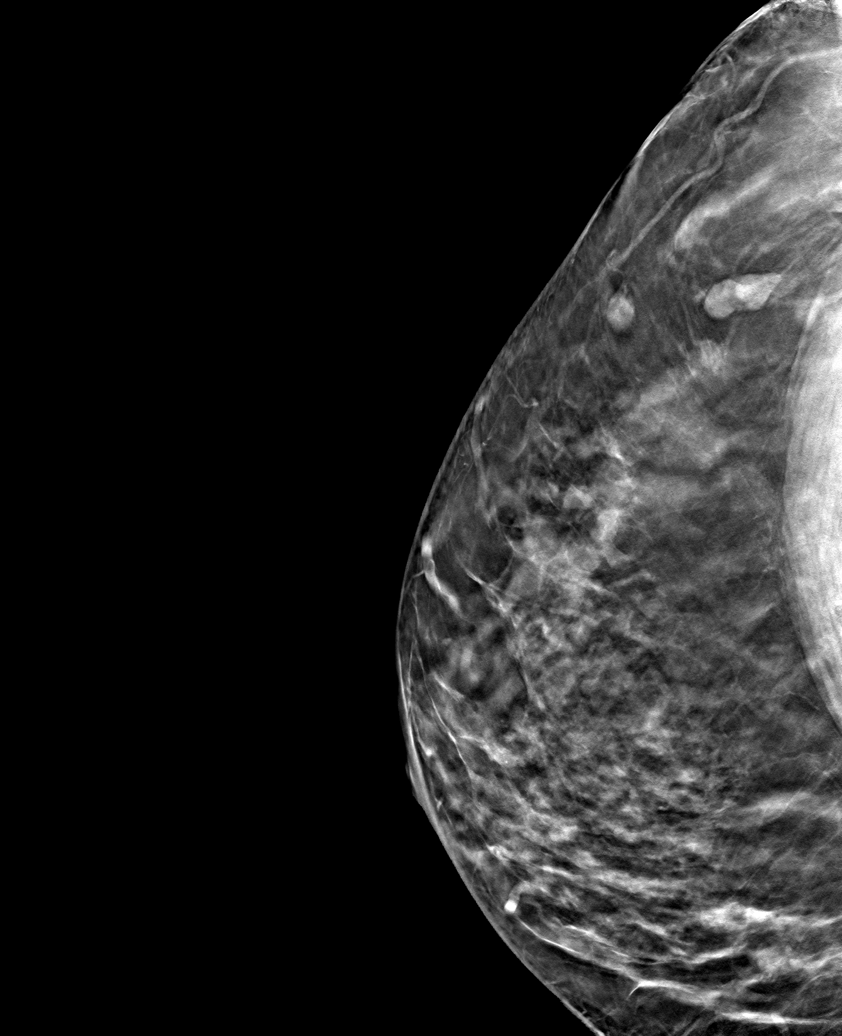

[6 of 30 positions shown; findings below may reference images not displayed]

ACR Breast Density Category c: The breast tissue is heterogeneously
dense, which may obscure small masses.
FINDINGS: There are no findings suspicious for malignancy.
IMPRESSION: No mammographic evidence of malignancy. A result letter of this
screening mammogram will be mailed directly to the patient.

RECOMMENDATION:
Screening mammogram in one year. (Code:Q3-W-BC3)

BI-RADS CATEGORY  1: Negative.

## 2022-03-14 DIAGNOSIS — I270 Primary pulmonary hypertension: Secondary | ICD-10-CM | POA: Diagnosis not present

## 2022-03-25 ENCOUNTER — Ambulatory Visit: Payer: Self-pay

## 2022-03-27 ENCOUNTER — Other Ambulatory Visit: Payer: Self-pay | Admitting: Internal Medicine

## 2022-03-27 ENCOUNTER — Other Ambulatory Visit: Payer: Self-pay | Admitting: Physician Assistant

## 2022-03-27 DIAGNOSIS — E042 Nontoxic multinodular goiter: Secondary | ICD-10-CM | POA: Diagnosis not present

## 2022-03-27 DIAGNOSIS — I10 Essential (primary) hypertension: Secondary | ICD-10-CM | POA: Diagnosis not present

## 2022-03-27 DIAGNOSIS — E039 Hypothyroidism, unspecified: Secondary | ICD-10-CM | POA: Diagnosis not present

## 2022-03-27 DIAGNOSIS — K219 Gastro-esophageal reflux disease without esophagitis: Secondary | ICD-10-CM

## 2022-04-08 ENCOUNTER — Ambulatory Visit: Payer: Self-pay

## 2022-04-15 ENCOUNTER — Ambulatory Visit (INDEPENDENT_AMBULATORY_CARE_PROVIDER_SITE_OTHER): Payer: BLUE CROSS/BLUE SHIELD

## 2022-04-15 DIAGNOSIS — G4733 Obstructive sleep apnea (adult) (pediatric): Secondary | ICD-10-CM | POA: Diagnosis not present

## 2022-04-15 NOTE — Progress Notes (Signed)
95 percentile pressure 7.0   95th percentile leak 10.7   apnea index 0.2 /hr  apnea-hypopnea index  0.2 /hr   total days used  >4 hr 88 days  total days used <4 hr 2 days  Total compliance 99 percent  She is doing very well. No problems or questions at this time. Pt was seen by Claiborne Billings  RRT/RCP  from [facility]

## 2022-04-27 ENCOUNTER — Encounter: Payer: Self-pay | Admitting: Nurse Practitioner

## 2022-04-27 ENCOUNTER — Ambulatory Visit: Payer: BLUE CROSS/BLUE SHIELD | Admitting: Nurse Practitioner

## 2022-04-27 VITALS — BP 138/88 | HR 81 | Temp 98.1°F | Resp 16 | Ht 66.0 in | Wt 289.0 lb

## 2022-04-27 DIAGNOSIS — Z7189 Other specified counseling: Secondary | ICD-10-CM

## 2022-04-27 DIAGNOSIS — G4733 Obstructive sleep apnea (adult) (pediatric): Secondary | ICD-10-CM

## 2022-04-27 NOTE — Progress Notes (Unsigned)
[facility] [place], [place] [address]  Internal MEDICINE  Office Visit Note  Patient Name: Shelley [name]  [id number]  [id number]  Date of Service: 04/27/2022  Chief Complaint  Patient presents with   Hypertension   Follow-up    HPI Shelley [name] presents for a follow-up visit for OSA on CPAP Does not like CPAP, feels like it is constricting and difficult to turn over in bed with it on. Uses the nasal pillows.  Wants to come off CPAP Discussed best interventions to be able to stop using CPAP would be significant weight loss Has talked to [employer] about weight loss in the past. Does not want to use oral weight loss medications or injectables. Prefers to work on weight loss with traditional diet and exercise.    CPAP download:  95 percentile pressure 7.0  95th percentile leak 10.7  apnea index 0.2 /hr  apnea-hypopnea index  0.2 /hr  total days used  >4 hr 88 days  total days used <4 hr 2 days Total compliance 99 percent   She is doing very well. No problems or questions at this time. Pt was seen by Claiborne Billings  RRT/RCP  from [facility]     Current Medication: Outpatient Encounter Medications as of 04/27/2022  Medication Sig   ALPRAZolam (XANAX) 0.25 MG tablet Take half tab twice a day as needed for severe panic attacks   azelastine (ASTELIN) 0.1 % nasal spray Place 2 sprays into both nostrils 2 (two) times daily. Use in each nostril as directed   diclofenac Sodium (VOLTAREN) 1 % GEL SMARTSIG:Gram(s) Topical 4 Times Daily PRN   diltiazem (CARDIZEM CD) 180 MG 24 hr capsule TAKE 1 CAPSULE BY MOUTH DAILY   ergocalciferol (DRISDOL) 1.25 MG (50000 UT) capsule Take one cap q week   escitalopram (LEXAPRO) 10 MG tablet TAKE 1 TABLET BY MOUTH ONCE DAILY WITH  SUPPER  FOR  PANIC  ATTACKS   famotidine (PEPCID) 10 MG tablet Take 1 tablet (10 mg total) by mouth daily.   meclizine (ANTIVERT) 25 MG tablet Take 25 mg by mouth 3 (three) times daily as needed.   meloxicam  (MOBIC) 15 MG tablet Take 1 tablet (15 mg total) by mouth daily as needed.   Multiple Vitamin (MULTIVITAMIN) tablet Take 1 tablet by mouth daily.   omeprazole (PRILOSEC) 40 MG capsule TAKE 1 CAPSULE BY MOUTH TWICE DAILY FOR HEARTBURN   No facility-administered encounter medications on file as of 04/27/2022.    Surgical History: Past Surgical History:  Procedure Laterality Date   COLONOSCOPY WITH PROPOFOL N/A 02/27/2021   Procedure: COLONOSCOPY WITH PROPOFOL;  Surgeon: Lucilla Lame, MD;  Location: [facility] ENDOSCOPY;  Service: Endoscopy;  Laterality: N/A;   ESOPHAGOGASTRODUODENOSCOPY  02/27/2021   Procedure: ESOPHAGOGASTRODUODENOSCOPY (EGD);  Surgeon: Lucilla Lame, MD;  Location: [facility] ENDOSCOPY;  Service: Endoscopy;;   RIGHT/LEFT HEART CATH AND CORONARY ANGIOGRAPHY Bilateral 05/26/2019   Procedure: RIGHT/LEFT HEART CATH AND CORONARY ANGIOGRAPHY;  Surgeon: Nelva Bush, MD;  Location: [facility] LAB;  Service: Cardiovascular;  Laterality: Bilateral;   TUBAL LIGATION      Medical History: Past Medical History:  Diagnosis Date   Anemia    Anxiety    Arthritis    Cardiomegaly    Family history of breast cancer    Family history of ovarian cancer    5/22 cancer genetic testing letter sent   Hypertension    Hyperthyroidism    Pulmonary hypertension ([place])    Sleep apnea  Thyroid disease    Vertigo     Family History: Family History  Problem Relation Age of Onset   Heart attack Mother    Ovarian cancer Mother    Cancer Father    Cancer Sister    Breast cancer Maternal Grandmother    Breast cancer Cousin        1st maternal cousin   Cancer Other     Social History   Socioeconomic History   Marital status: Single    Spouse name: Not on file   Number of children: Not on file   Years of education: Not on file   Highest education level: Not on file  Occupational History   Not on file  Tobacco Use   Smoking status: Never   Smokeless tobacco: Never  Vaping Use    Vaping Use: Never used  Substance and Sexual Activity   Alcohol use: No   Drug use: No   Sexual activity: Yes    Birth control/protection: I.U.D.  Other Topics Concern   Not on file  Social History Narrative   Not on file   Social Determinants of Health   Financial Resource Strain: Not on file  Food Insecurity: Not on file  Transportation Needs: Not on file  Physical Activity: Not on file  Stress: Not on file  Social Connections: Not on file  Intimate Partner Violence: Not on file      Review of Systems  Vital Signs: BP (!) 157/90   Pulse 81   Temp 98.1 F (36.7 C)   Resp 16   Ht 5' 6"$  (1.676 m)   Wt 289 lb (131.1 kg)   BMI 46.65 kg/m    Physical Exam     Assessment/Plan:   General Counseling: Shelley [name] verbalizes understanding of the findings of todays visit and agrees with plan of treatment. I have discussed any further diagnostic evaluation that may be needed or ordered today. We also reviewed her medications today. she has been encouraged to call the office with any questions or concerns that should arise related to todays visit.    No orders of the defined types were placed in this encounter.   No orders of the defined types were placed in this encounter.   Return in about 6 months (around 10/26/2022) for F/U, pulmonary/sleep with [name] or DSK for sleep apnea after appt with [name]. Marland Kitchen   Total time spent:*** Minutes Time spent includes review of chart, medications, test results, and follow up plan with the patient.   [place] Controlled Substance Database was reviewed by me.  This patient was seen by Jonetta Osgood, FNP-C in collaboration with Dr. Clayborn Bigness as a part of collaborative care agreement.   Jackson Fetters R. Valetta Fuller, MSN, FNP-C Internal medicine

## 2022-04-28 ENCOUNTER — Encounter: Payer: Self-pay | Admitting: Nurse Practitioner

## 2022-06-15 ENCOUNTER — Ambulatory Visit (INDEPENDENT_AMBULATORY_CARE_PROVIDER_SITE_OTHER): Payer: BLUE CROSS/BLUE SHIELD | Admitting: Physician Assistant

## 2022-06-15 ENCOUNTER — Telehealth: Payer: Self-pay | Admitting: *Deleted

## 2022-06-15 ENCOUNTER — Encounter: Payer: Self-pay | Admitting: Physician Assistant

## 2022-06-15 VITALS — BP 149/82 | HR 68 | Temp 98.3°F | Resp 16 | Ht 66.0 in | Wt 283.6 lb

## 2022-06-15 DIAGNOSIS — F411 Generalized anxiety disorder: Secondary | ICD-10-CM

## 2022-06-15 DIAGNOSIS — E782 Mixed hyperlipidemia: Secondary | ICD-10-CM

## 2022-06-15 DIAGNOSIS — R0602 Shortness of breath: Secondary | ICD-10-CM

## 2022-06-15 DIAGNOSIS — I10 Essential (primary) hypertension: Secondary | ICD-10-CM

## 2022-06-15 DIAGNOSIS — E538 Deficiency of other specified B group vitamins: Secondary | ICD-10-CM

## 2022-06-15 DIAGNOSIS — E559 Vitamin D deficiency, unspecified: Secondary | ICD-10-CM

## 2022-06-15 DIAGNOSIS — K219 Gastro-esophageal reflux disease without esophagitis: Secondary | ICD-10-CM | POA: Diagnosis not present

## 2022-06-15 DIAGNOSIS — R5383 Other fatigue: Secondary | ICD-10-CM

## 2022-06-15 DIAGNOSIS — R946 Abnormal results of thyroid function studies: Secondary | ICD-10-CM

## 2022-06-15 DIAGNOSIS — M150 Primary generalized (osteo)arthritis: Secondary | ICD-10-CM

## 2022-06-15 MED ORDER — ALPRAZOLAM 0.25 MG PO TABS: ORAL_TABLET | ORAL | 0 refills | Status: DC

## 2022-06-15 MED ORDER — ESCITALOPRAM OXALATE 10 MG PO TABS: ORAL_TABLET | ORAL | 2 refills | Status: DC

## 2022-06-15 MED ORDER — MELOXICAM 15 MG PO TABS: 15.0000 mg | ORAL_TABLET | Freq: Every day | ORAL | 1 refills | Status: DC | PRN

## 2022-06-15 MED ORDER — FAMOTIDINE 10 MG PO TABS: 10.0000 mg | ORAL_TABLET | Freq: Every day | ORAL | 1 refills | Status: AC

## 2022-06-15 NOTE — Progress Notes (Signed)
[facility] [address] [place], [place] [address]  Internal MEDICINE  Office Visit Note  Patient Name: Shelley MoloneyShirdene C [name]  [id number]  [id number]  Date of Service: 06/15/2022  Chief Complaint  Patient presents with   Follow-up   Anxiety   Hypertension    HPI Pt is here for routine follow up -Does take diltizem at night, Bp borderline 140/88 in office--will start monitoring at home and determine if need to adjust meds -down 6lbs since last in office, exercising a little and watching diet -Goes back to [facility] next month, they have been monitoring hernia and resulting reflux and pt has been working on wt lioss prior to having surgery -Wearing cpap with oxygen at night. Due for PFT and will go ahead and order for before pulm visit -Does need refills today, only uses xanax rarely for things like flights -Will order labs before CPE  Current Medication: Outpatient Encounter Medications as of 06/15/2022  Medication Sig   azelastine (ASTELIN) 0.1 % nasal spray Place 2 sprays into both nostrils 2 (two) times daily. Use in each nostril as directed   diclofenac Sodium (VOLTAREN) 1 % GEL SMARTSIG:Gram(s) Topical 4 Times Daily PRN   diltiazem (CARDIZEM CD) 180 MG 24 hr capsule TAKE 1 CAPSULE BY MOUTH DAILY   ergocalciferol (DRISDOL) 1.25 MG (50000 UT) capsule Take one cap q week   meclizine (ANTIVERT) 25 MG tablet Take 25 mg by mouth 3 (three) times daily as needed.   Multiple Vitamin (MULTIVITAMIN) tablet Take 1 tablet by mouth daily.   omeprazole (PRILOSEC) 40 MG capsule TAKE 1 CAPSULE BY MOUTH TWICE DAILY FOR HEARTBURN   [DISCONTINUED] ALPRAZolam (XANAX) 0.25 MG tablet Take half tab twice a day as needed for severe panic attacks   [DISCONTINUED] escitalopram (LEXAPRO) 10 MG tablet TAKE 1 TABLET BY MOUTH ONCE DAILY WITH  SUPPER  FOR  PANIC  ATTACKS   [DISCONTINUED] famotidine (PEPCID) 10 MG tablet Take 1 tablet (10 mg total) by mouth daily.   [DISCONTINUED] meloxicam (MOBIC) 15 MG  tablet Take 1 tablet (15 mg total) by mouth daily as needed.   ALPRAZolam (XANAX) 0.25 MG tablet Take half tab twice a day as needed for severe panic attacks   escitalopram (LEXAPRO) 10 MG tablet TAKE 1 TABLET BY MOUTH ONCE DAILY WITH  SUPPER  FOR  PANIC  ATTACKS   famotidine (PEPCID) 10 MG tablet Take 1 tablet (10 mg total) by mouth daily.   meloxicam (MOBIC) 15 MG tablet Take 1 tablet (15 mg total) by mouth daily as needed.   No facility-administered encounter medications on file as of 06/15/2022.    Surgical History: Past Surgical History:  Procedure Laterality Date   COLONOSCOPY WITH PROPOFOL N/A 02/27/2021   Procedure: COLONOSCOPY WITH PROPOFOL;  Surgeon: Midge MiniumWohl, Darren, MD;  [facility];  Service: Endoscopy;  Laterality: N/A;   ESOPHAGOGASTRODUODENOSCOPY  02/27/2021   Procedure: ESOPHAGOGASTRODUODENOSCOPY (EGD);  Surgeon: Midge MiniumWohl, Darren, MD;  [facility];  Service: Endoscopy;;   RIGHT/LEFT HEART CATH AND CORONARY ANGIOGRAPHY Bilateral 05/26/2019   Procedure: RIGHT/LEFT HEART CATH AND CORONARY ANGIOGRAPHY;  Surgeon: Yvonne KendallEnd, Christopher, MD;  Location: [facility] INVASIVE CV LAB;  Service: Cardiovascular;  Laterality: Bilateral;   TUBAL LIGATION      Medical History: Past Medical History:  Diagnosis Date   Anemia    Anxiety    Arthritis    Cardiomegaly    Family history of breast cancer    Family history of ovarian cancer    5/22 cancer genetic  testing letter sent   Hypertension    Hyperthyroidism    Pulmonary hypertension    Sleep apnea    Thyroid disease    Vertigo     Family History: Family History  Problem Relation Age of Onset   Heart attack Mother    Ovarian cancer Mother    Cancer Father    Cancer Sister    Breast cancer Maternal Grandmother    Breast cancer Cousin        1st maternal cousin   Cancer Other     Social History   Socioeconomic History   Marital status: Single    Spouse name: Not on file   Number of children: Not on file    Years of education: Not on file   Highest education level: Not on file  Occupational History   Not on file  Tobacco Use   Smoking status: Never   Smokeless tobacco: Never  Vaping Use   Vaping Use: Never used  Substance and Sexual Activity   Alcohol use: No   Drug use: No   Sexual activity: Yes    Birth control/protection: I.U.D.  Other Topics Concern   Not on file  Social History Narrative   Not on file   Social Determinants of Health   Financial Resource Strain: Not on file  Food Insecurity: Not on file  Transportation Needs: Not on file  Physical Activity: Not on file  Stress: Not on file  Social Connections: Not on file  Intimate Partner Violence: Not on file      Review of Systems  Constitutional:  Negative for chills, fatigue and unexpected weight change.  HENT: Negative.  Negative for congestion, rhinorrhea, sneezing and sore throat.   Eyes:  Negative for redness.  Respiratory: Negative.  Negative for cough, chest tightness and shortness of breath.   Cardiovascular: Negative.  Negative for chest pain and palpitations.  Gastrointestinal:  Negative for abdominal pain, constipation, diarrhea, nausea and vomiting.  Genitourinary:  Negative for dysuria and frequency.  Musculoskeletal:  Negative for arthralgias, back pain, joint swelling and neck pain.  Skin:  Negative for rash.  Neurological: Negative.  Negative for tremors and numbness.  Hematological:  Negative for adenopathy. Does not bruise/bleed easily.  Psychiatric/Behavioral:  Negative for behavioral problems (Depression), sleep disturbance and suicidal ideas. The patient is not nervous/anxious.     Vital Signs: BP (!) 149/82   Pulse 68   Temp 98.3 F (36.8 C)   Resp 16   Ht 5\' 6"  (1.676 m)   Wt 283 lb 9.6 oz (128.6 kg)   SpO2 98%   BMI 45.77 kg/m    Physical Exam Constitutional:      General: She is not in acute distress.    Appearance: Normal appearance. She is obese. She is not ill-appearing.   HENT:     Head: Normocephalic and atraumatic.  Eyes:     Pupils: Pupils are equal, round, and reactive to light.  Cardiovascular:     Rate and Rhythm: Normal rate and regular rhythm.  Pulmonary:     Effort: Pulmonary effort is normal. No respiratory distress.  Musculoskeletal:        General: Normal range of motion.  Skin:    General: Skin is warm and dry.  Neurological:     Mental Status: She is oriented to person, place, and time.  Psychiatric:        Mood and Affect: Mood normal.        Behavior: Behavior normal.  Assessment/Plan: 1. Essential hypertension Borderline in office and pt will monitor at home  2. GAD (generalized anxiety disorder) - ALPRAZolam (XANAX) 0.25 MG tablet; Take half tab twice a day as needed for severe panic attacks  Dispense: 30 tablet; Refill: 0  3. Gastroesophageal reflux disease without esophagitis - famotidine (PEPCID) 10 MG tablet; Take 1 tablet (10 mg total) by mouth daily.  Dispense: 90 tablet; Refill: 1  4. SOB (shortness of breath) - Pulmonary Function Test; Future  5. Primary generalized (osteo)arthritis - meloxicam (MOBIC) 15 MG tablet; Take 1 tablet (15 mg total) by mouth daily as needed.  Dispense: 90 tablet; Refill: 1  6. B12 deficiency - B12 and Folate Panel  7. Vitamin D deficiency - VITAMIN D 25 Hydroxy (Vit-D Deficiency, Fractures)  8. Abnormal thyroid exam - TSH + free T4  9. Mixed hyperlipidemia - Lipid Panel With LDL/HDL Ratio  10. Other fatigue - B12 and Folate Panel - Comprehensive metabolic panel - CBC w/Diff/Platelet - Lipid Panel With LDL/HDL Ratio - TSH + free T4 - VITAMIN D 25 Hydroxy (Vit-D Deficiency, Fractures)   General Counseling: Shelley [name] verbalizes understanding of the findings of todays visit and agrees with plan of treatment. I have discussed any further diagnostic evaluation that may be needed or ordered today. We also reviewed her medications today. she has been encouraged to call the  office with any questions or concerns that should arise related to todays visit.    Orders Placed This Encounter  Procedures   B12 and Folate Panel   Comprehensive metabolic panel   CBC w/Diff/Platelet   Lipid Panel With LDL/HDL Ratio   TSH + free T4   VITAMIN D 25 Hydroxy (Vit-D Deficiency, Fractures)   Pulmonary Function Test    Meds ordered this encounter  Medications   escitalopram (LEXAPRO) 10 MG tablet    Sig: TAKE 1 TABLET BY MOUTH ONCE DAILY WITH  SUPPER  FOR  PANIC  ATTACKS    Dispense:  90 tablet    Refill:  2   famotidine (PEPCID) 10 MG tablet    Sig: Take 1 tablet (10 mg total) by mouth daily.    Dispense:  90 tablet    Refill:  1   meloxicam (MOBIC) 15 MG tablet    Sig: Take 1 tablet (15 mg total) by mouth daily as needed.    Dispense:  90 tablet    Refill:  1   ALPRAZolam (XANAX) 0.25 MG tablet    Sig: Take half tab twice a day as needed for severe panic attacks    Dispense:  30 tablet    Refill:  0    This patient was seen by Lynn Ito, PA-C in collaboration with Dr. Beverely Risen as a part of collaborative care agreement.   Total time spent:30 Minutes Time spent includes review of chart, medications, test results, and follow up plan with the patient.      Dr Lyndon Code Internal medicine

## 2022-06-16 NOTE — Patient Outreach (Addendum)
  Care Coordination   Initial Visit Note   06/16/2022 Late Entry Name: KORAL ORCHARD MRN: [id number] DOB: [birth year]  Johnella Moloney is a 62 y.o. year old female who sees McDonough, Salomon Fick, PA-C for primary care. I  met with patient at the providers office on 06/15/22 to discuss [facility] Care Management services.  What matters to the patients health and wellness today?  Patient discussed goal to stop CPAP and oxygen use. She would also like to lose some weight and improve my sleep.     Goals Addressed             This Visit's Progress    Care coordination activities       Care Coordination Interventions: [facility] Care Management program discussed-patient agreeable to RNCM follow up  SDOH Screening completed  RNCM initial assessment scheduled for 07/13/22 at 2pm         SDOH assessments and interventions completed:  Yes  SDOH Interventions Today    Flowsheet Row Most Recent Value  SDOH Interventions   Food Insecurity Interventions Intervention Not Indicated  Housing Interventions Intervention Not Indicated  Transportation Interventions Intervention Not Indicated        Care Coordination Interventions:  Yes, provided  Interventions Today    Flowsheet Row Most Recent Value  Chronic Disease   Chronic disease during today's visit Other  [OSA]  General Interventions   General Interventions Discussed/Reviewed General Interventions Discussed  Education Interventions   Education Provided Provided Education  Provided Verbal Education On Other  [[facility] Care Management]       Follow up plan: Follow up call scheduled for RNCM on 07/13/22    Encounter Outcome:  Pt. Visit Completed

## 2022-06-16 NOTE — Patient Instructions (Signed)
Visit Information  Thank you for taking time to visit with me today. Please don't hesitate to contact me if I can be of assistance to you.   Following are the goals we discussed today:   Goals Addressed             This Visit's Progress    Care coordination activities       Care Coordination Interventions: [facility] Care Management program discussed-patient agreeable to [facility] follow up  SDOH Screening completed  RNCM initial assessment scheduled for 07/13/22 at 2pm         Our next appointment is by telephone on 07/13/22 at 2pm  Please call the care guide team at [phone] if you need to cancel or reschedule your appointment.   If you are experiencing a Mental Health or Behavioral Health Crisis or need someone to talk to, please call 911   Patient verbalizes understanding of instructions and care plan provided today and agrees to view in MyChart. Active MyChart status and patient understanding of how to access instructions and care plan via MyChart confirmed with patient.     An initial telephone outreach has been scheduled for:  07/13/22  Verna Czech, LCSW Clinical Social Worker  [facility] Care Management [phone]

## 2022-07-13 ENCOUNTER — Ambulatory Visit: Payer: Self-pay

## 2022-07-13 NOTE — Patient Outreach (Signed)
  Care Coordination   Follow Up Visit Note   07/13/2022 Name: Shelley [name] MRN: [id number] DOB: November 04, 1960  Shelley [name] is a 62 y.o. year old female who sees McDonough, Salomon Fick, PA-C for primary care. I spoke with  Shelley [name] by phone today.  What matters to the patients health and wellness today? Patient states she wants to lose weight so she can stop wearing her CPAP machine.  Patient states she has started cutting back on her portion sizes.  She states she has problems with her knees so she doesn't exercise much.    Goals Addressed             This Visit's Progress    Care coordination activities - information regarding weigh management/ loss       Interventions Today    Flowsheet Row Most Recent Value  Chronic Disease   Chronic disease during today's visit Other  [OSA/weight loss]  General Interventions   General Interventions Discussed/Reviewed General Interventions Discussed, Doctor Visits  [evaluation of current treatment plan for weight loss/ OSA  and patients adherence to plan as established by provider. Assessed current weight. Assessed for exercises.]  Doctor Visits Discussed/Reviewed Doctor Visits Discussed  [reviewed scheduled/ upcoming provider visits.]  Exercise Interventions   Exercise Discussed/Reviewed Exercise Discussed  [Discussed importance of being more active. Advised to walk laps in the home several times per day.  Advised to review/ implement [facility] exercise booklet exercises. Encouraged walking groups, gym membership]  Education Interventions   Education Provided Provided Printed Education, Provided Education  [patient mailed [facility] exercise booklet and information on weight loss management.]  Provided Verbal Education On Other  [Advised to enhance water with fruit/ berries, cucumber, lemon.]  Nutrition Interventions   Nutrition Discussed/Reviewed Nutrition Discussed, Decreasing sugar intake, Portion sizes, Fluid intake  [Assessed patient food  choices. Discussed controlling weight with portion control, decreasing sugar/ carbohydrates.  Advised  add fruits/ vegetables lean meats. encouraged increased water intake.]  Pharmacy Interventions   Pharmacy Dicussed/Reviewed Pharmacy Topics Discussed  [medication reviewed and compliance discussed]              SDOH assessments and interventions completed:  Yes  SDOH Interventions Today    Flowsheet Row Most Recent Value  SDOH Interventions   Food Insecurity Interventions Intervention Not Indicated  Housing Interventions Intervention Not Indicated  Transportation Interventions Intervention Not Indicated        Care Coordination Interventions:  Yes, provided   Follow up plan: Follow up call scheduled for 08/07/22    Encounter Outcome:  Pt. Visit Completed   George Ina RN,BSN,CCM [facility] Care Coordination [phone] direct line

## 2022-07-20 ENCOUNTER — Other Ambulatory Visit: Payer: Self-pay | Admitting: Physician Assistant

## 2022-07-20 DIAGNOSIS — K219 Gastro-esophageal reflux disease without esophagitis: Secondary | ICD-10-CM

## 2022-07-21 LAB — CBC WITH DIFFERENTIAL/PLATELET
Basophils Absolute: 0.1 10*3/uL (ref 0.0–0.2)
Basos: 1 %
EOS (ABSOLUTE): 0.3 10*3/uL (ref 0.0–0.4)
Eos: 3 %
Hematocrit: 38 % (ref 34.0–46.6)
Hemoglobin: 12.7 g/dL (ref 11.1–15.9)
Immature Grans (Abs): 0 10*3/uL (ref 0.0–0.1)
Immature Granulocytes: 0 %
Lymphocytes Absolute: 2.9 10*3/uL (ref 0.7–3.1)
Lymphs: 28 %
MCH: 28.6 pg (ref 26.6–33.0)
MCHC: 33.4 g/dL (ref 31.5–35.7)
MCV: 86 fL (ref 79–97)
Monocytes Absolute: 0.8 10*3/uL (ref 0.1–0.9)
Monocytes: 8 %
Neutrophils Absolute: 6.5 10*3/uL (ref 1.4–7.0)
Neutrophils: 60 %
Platelets: 393 10*3/uL (ref 150–450)
RBC: 4.44 x10E6/uL (ref 3.77–5.28)
RDW: 13.4 % (ref 11.7–15.4)
WBC: 10.6 10*3/uL (ref 3.4–10.8)

## 2022-07-21 LAB — TSH+FREE T4
Free T4: 1.15 ng/dL (ref 0.82–1.77)
TSH: 1.27 u[IU]/mL (ref 0.450–4.500)

## 2022-07-21 LAB — B12 AND FOLATE PANEL
Folate: 19.2 ng/mL (ref >3.0)
Vitamin B-12: 666 pg/mL (ref 232–1245)

## 2022-07-21 LAB — LIPID PANEL WITH LDL/HDL RATIO
Cholesterol, Total: 145 mg/dL (ref 100–199)
HDL: 40 mg/dL (ref >39)
LDL Chol Calc (NIH): 83 mg/dL (ref 0–99)
LDL/HDL Ratio: 2.1 ratio (ref 0.0–3.2)
Triglycerides: 124 mg/dL (ref 0–149)
VLDL Cholesterol Cal: 22 mg/dL (ref 5–40)

## 2022-07-21 LAB — COMPREHENSIVE METABOLIC PANEL
ALT: 18 IU/L (ref 0–32)
AST: 16 IU/L (ref 0–40)
Albumin/Globulin Ratio: 1.4 (ref 1.2–2.2)
Albumin: 4.2 g/dL (ref 3.9–4.9)
Alkaline Phosphatase: 71 IU/L (ref 44–121)
BUN/Creatinine Ratio: 21 (ref 12–28)
BUN: 15 mg/dL (ref 8–27)
Bilirubin Total: 0.3 mg/dL (ref 0.0–1.2)
CO2: 21 mmol/L (ref 20–29)
Calcium: 9.6 mg/dL (ref 8.7–10.3)
Chloride: 105 mmol/L (ref 96–106)
Creatinine, Ser: 0.73 mg/dL (ref 0.57–1.00)
Globulin, Total: 2.9 g/dL (ref 1.5–4.5)
Glucose: 96 mg/dL (ref 70–99)
Potassium: 4.3 mmol/L (ref 3.5–5.2)
Sodium: 138 mmol/L (ref 134–144)
Total Protein: 7.1 g/dL (ref 6.0–8.5)
eGFR: 93 mL/min/{1.73_m2} (ref >59)

## 2022-07-21 LAB — VITAMIN D 25 HYDROXY (VIT D DEFICIENCY, FRACTURES): Vit D, 25-Hydroxy: 59 ng/mL (ref 30.0–100.0)

## 2022-07-27 ENCOUNTER — Encounter: Payer: Self-pay | Admitting: Physician Assistant

## 2022-07-27 ENCOUNTER — Encounter: Payer: Self-pay | Admitting: Surgery

## 2022-07-27 ENCOUNTER — Ambulatory Visit (INDEPENDENT_AMBULATORY_CARE_PROVIDER_SITE_OTHER): Payer: Self-pay | Admitting: Surgery

## 2022-07-27 ENCOUNTER — Ambulatory Visit (INDEPENDENT_AMBULATORY_CARE_PROVIDER_SITE_OTHER): Payer: BLUE CROSS/BLUE SHIELD | Admitting: Physician Assistant

## 2022-07-27 VITALS — BP 121/89 | HR 81 | Temp 97.6°F | Resp 16 | Ht 66.0 in | Wt 287.4 lb

## 2022-07-27 VITALS — BP 141/89 | HR 86 | Temp 98.0°F | Ht 66.0 in | Wt 283.0 lb

## 2022-07-27 DIAGNOSIS — I10 Essential (primary) hypertension: Secondary | ICD-10-CM | POA: Diagnosis not present

## 2022-07-27 DIAGNOSIS — G4733 Obstructive sleep apnea (adult) (pediatric): Secondary | ICD-10-CM

## 2022-07-27 DIAGNOSIS — Z1231 Encounter for screening mammogram for malignant neoplasm of breast: Secondary | ICD-10-CM | POA: Diagnosis not present

## 2022-07-27 DIAGNOSIS — L02229 Furuncle of trunk, unspecified: Secondary | ICD-10-CM

## 2022-07-27 DIAGNOSIS — K219 Gastro-esophageal reflux disease without esophagitis: Secondary | ICD-10-CM

## 2022-07-27 DIAGNOSIS — Z6841 Body Mass Index (BMI) 40.0 and over, adult: Secondary | ICD-10-CM

## 2022-07-27 DIAGNOSIS — R300 Dysuria: Secondary | ICD-10-CM

## 2022-07-27 DIAGNOSIS — K449 Diaphragmatic hernia without obstruction or gangrene: Secondary | ICD-10-CM

## 2022-07-27 DIAGNOSIS — Z0001 Encounter for general adult medical examination with abnormal findings: Secondary | ICD-10-CM

## 2022-07-27 MED ORDER — DOXYCYCLINE HYCLATE 100 MG PO TABS
100.0000 mg | ORAL_TABLET | Freq: Two times a day (BID) | ORAL | 0 refills | Status: DC
Start: 2022-07-27 — End: 2022-10-04

## 2022-07-27 NOTE — Progress Notes (Signed)
[facility] [address] [place], [place] [address]  Internal MEDICINE  Office Visit Note  Patient Name: Shelley [name]  [id number]  [id number]  Date of Service: 08/05/2022  Chief Complaint  Patient presents with   Annual Exam   Hypertension     HPI Pt is here for routine health maintenance examination -Wearing cpap nightly, Sleeps 6-7 hours -Bp improved -Labs reviewed, vit D better and will switch to OTC -Has been more tired again lately, no changes -Working to lose weight for hernia surgery, was told goal is 260lbs and goes back in 6 months -Right side stomach has a boil on her skin--draining and has been wearing bandage  Current Medication: Outpatient Encounter Medications as of 07/27/2022  Medication Sig   ALPRAZolam (XANAX) 0.25 MG tablet Take half tab twice a day as needed for severe panic attacks   diclofenac Sodium (VOLTAREN) 1 % GEL SMARTSIG:Gram(s) Topical 4 Times Daily PRN   diltiazem (CARDIZEM CD) 180 MG 24 hr capsule TAKE 1 CAPSULE BY MOUTH DAILY   doxycycline (VIBRA-TABS) 100 MG tablet Take 1 tablet (100 mg total) by mouth 2 (two) times daily.   ergocalciferol (DRISDOL) 1.25 MG (50000 UT) capsule Take one cap q week   escitalopram (LEXAPRO) 10 MG tablet TAKE 1 TABLET BY MOUTH ONCE DAILY WITH  SUPPER  FOR  PANIC  ATTACKS   famotidine (PEPCID) 10 MG tablet Take 1 tablet (10 mg total) by mouth daily.   fluticasone (FLONASE) 50 MCG/ACT nasal spray Place 1 spray into both nostrils daily.   loratadine (CLARITIN) 10 MG tablet Take 10 mg by mouth daily.   meclizine (ANTIVERT) 25 MG tablet Take 25 mg by mouth 3 (three) times daily as needed.   meloxicam (MOBIC) 15 MG tablet Take 1 tablet (15 mg total) by mouth daily as needed.   Multiple Vitamin (MULTIVITAMIN) tablet Take 1 tablet by mouth daily.   omeprazole (PRILOSEC) 40 MG capsule TAKE 1 CAPSULE BY MOUTH DAILY FOR HEARTBURN   No facility-administered encounter medications on file as of 07/27/2022.     Surgical History: Past Surgical History:  Procedure Laterality Date   COLONOSCOPY WITH PROPOFOL N/A 02/27/2021   Procedure: COLONOSCOPY WITH PROPOFOL;  Surgeon: Midge Minium, MD;  Location: [facility] ENDOSCOPY;  Service: Endoscopy;  Laterality: N/A;   ESOPHAGOGASTRODUODENOSCOPY  02/27/2021   Procedure: ESOPHAGOGASTRODUODENOSCOPY (EGD);  Surgeon: Midge Minium, MD;  Location: [facility] ENDOSCOPY;  Service: Endoscopy;;   RIGHT/LEFT HEART CATH AND CORONARY ANGIOGRAPHY Bilateral 05/26/2019   Procedure: RIGHT/LEFT HEART CATH AND CORONARY ANGIOGRAPHY;  Surgeon: Yvonne Kendall, MD;  Location: [facility] INVASIVE CV LAB;  Service: Cardiovascular;  Laterality: Bilateral;   TUBAL LIGATION      Medical History: Past Medical History:  Diagnosis Date   Anemia    Anxiety    Arthritis    Cardiomegaly    Family history of breast cancer    Family history of ovarian cancer    5/22 cancer genetic testing letter sent   Hypertension    Hyperthyroidism    Pulmonary hypertension (HCC)    Sleep apnea    Thyroid disease    Vertigo     Family History: Family History  Problem Relation Age of Onset   Heart attack Mother    Ovarian cancer Mother    Cancer Father    Cancer Sister    Breast cancer Maternal Grandmother    Breast cancer Cousin        1st maternal cousin   Cancer Other  Review of Systems  Constitutional:  Positive for fatigue. Negative for chills and unexpected weight change.  HENT: Negative.  Negative for congestion, rhinorrhea, sneezing and sore throat.   Eyes:  Negative for redness.  Respiratory: Negative.  Negative for cough, chest tightness and shortness of breath.   Cardiovascular: Negative.  Negative for chest pain and palpitations.  Gastrointestinal:  Negative for abdominal pain, constipation, diarrhea, nausea and vomiting.  Genitourinary:  Negative for dysuria and frequency.  Musculoskeletal:  Negative for arthralgias, back pain, joint swelling and neck pain.  Skin:  Positive  for wound. Negative for rash.  Neurological: Negative.  Negative for tremors and numbness.  Hematological:  Negative for adenopathy. Does not bruise/bleed easily.  Psychiatric/Behavioral:  Negative for behavioral problems (Depression), sleep disturbance and suicidal ideas. The patient is not nervous/anxious.      Vital Signs: BP 121/89   Pulse 81   Temp 97.6 F (36.4 C)   Resp 16   Ht 5\' 6"  (1.676 m)   Wt 287 lb 6.4 oz (130.4 kg)   SpO2 96%   BMI 46.39 kg/m    Physical Exam Vitals and nursing note reviewed.  Constitutional:      General: She is not in acute distress.    Appearance: She is well-developed. She is obese. She is not diaphoretic.  HENT:     Head: Normocephalic and atraumatic.     Mouth/Throat:     Pharynx: No oropharyngeal exudate.  Eyes:     Pupils: Pupils are equal, round, and reactive to light.  Neck:     Thyroid: No thyromegaly.     Vascular: No JVD.     Trachea: No tracheal deviation.  Cardiovascular:     Rate and Rhythm: Normal rate and regular rhythm.     Heart sounds: Normal heart sounds. No murmur heard.    No friction rub. No gallop.  Pulmonary:     Effort: Pulmonary effort is normal. No respiratory distress.     Breath sounds: No wheezing or rales.  Chest:     Chest wall: No tenderness.  Abdominal:     General: Bowel sounds are normal.     Palpations: Abdomen is soft.     Tenderness: There is no abdominal tenderness.  Musculoskeletal:        General: Normal range of motion.     Cervical back: Normal range of motion and neck supple.  Lymphadenopathy:     Cervical: No cervical adenopathy.  Skin:    General: Skin is warm and dry.     Findings: Lesion present.     Comments: Boil on lower right side of abdomen, actively draining in office  Neurological:     Mental Status: She is alert and oriented to person, place, and time.     Cranial Nerves: No cranial nerve deficit.  Psychiatric:        Behavior: Behavior normal.        Thought  Content: Thought content normal.        Judgment: Judgment normal.      LABS: Recent Results (from the past 2160 hour(s))  B12 and Folate Panel     Status: None   Collection Time: 07/20/22  8:29 AM  Result Value Ref Range   Vitamin B-12 666 232 - 1,245 pg/mL   Folate 19.2 >3.0 ng/mL    Comment: A serum folate concentration of less than 3.1 ng/mL is considered to represent clinical deficiency.   Comprehensive metabolic panel     Status:  None   Collection Time: 07/20/22  8:29 AM  Result Value Ref Range   Glucose 96 70 - 99 mg/dL   BUN 15 8 - 27 mg/dL   Creatinine, Ser 1.61 0.57 - 1.00 mg/dL   eGFR 93 >09 UE/AVW/0.98   BUN/Creatinine Ratio 21 12 - 28   Sodium 138 134 - 144 mmol/L   Potassium 4.3 3.5 - 5.2 mmol/L   Chloride 105 96 - 106 mmol/L   CO2 21 20 - 29 mmol/L   Calcium 9.6 8.7 - 10.3 mg/dL   Total Protein 7.1 6.0 - 8.5 g/dL   Albumin 4.2 3.9 - 4.9 g/dL   Globulin, Total 2.9 1.5 - 4.5 g/dL   Albumin/Globulin Ratio 1.4 1.2 - 2.2   Bilirubin Total 0.3 0.0 - 1.2 mg/dL   Alkaline Phosphatase 71 44 - 121 IU/L   AST 16 0 - 40 IU/L   ALT 18 0 - 32 IU/L  CBC w/Diff/Platelet     Status: None   Collection Time: 07/20/22  8:29 AM  Result Value Ref Range   WBC 10.6 3.4 - 10.8 x10E3/uL   RBC 4.44 3.77 - 5.28 x10E6/uL   Hemoglobin 12.7 11.1 - 15.9 g/dL   Hematocrit 11.9 14.7 - 46.6 %   MCV 86 79 - 97 fL   MCH 28.6 26.6 - 33.0 pg   MCHC 33.4 31.5 - 35.7 g/dL   RDW 82.9 56.2 - 13.0 %   Platelets 393 150 - 450 x10E3/uL   Neutrophils 60 Not Estab. %   Lymphs 28 Not Estab. %   Monocytes 8 Not Estab. %   Eos 3 Not Estab. %   Basos 1 Not Estab. %   Neutrophils Absolute 6.5 1.4 - 7.0 x10E3/uL   Lymphocytes Absolute 2.9 0.7 - 3.1 x10E3/uL   Monocytes Absolute 0.8 0.1 - 0.9 x10E3/uL   EOS (ABSOLUTE) 0.3 0.0 - 0.4 x10E3/uL   Basophils Absolute 0.1 0.0 - 0.2 x10E3/uL   Immature Granulocytes 0 Not Estab. %   Immature Grans (Abs) 0.0 0.0 - 0.1 x10E3/uL  Lipid Panel With LDL/HDL  Ratio     Status: None   Collection Time: 07/20/22  8:29 AM  Result Value Ref Range   Cholesterol, Total 145 100 - 199 mg/dL   Triglycerides 865 0 - 149 mg/dL   HDL 40 >78 mg/dL   VLDL Cholesterol Cal 22 5 - 40 mg/dL   LDL Chol Calc (NIH) 83 0 - 99 mg/dL   LDL/HDL Ratio 2.1 0.0 - 3.2 ratio    Comment:                                     LDL/HDL Ratio                                             Men  Women                               1/2 Avg.Risk  1.0    1.5                                   Avg.Risk  3.6    3.2  2X Avg.Risk  6.2    5.0                                3X Avg.Risk  8.0    6.1   TSH + free T4     Status: None   Collection Time: 07/20/22  8:29 AM  Result Value Ref Range   TSH 1.270 0.450 - 4.500 uIU/mL   Free T4 1.15 0.82 - 1.77 ng/dL  VITAMIN D 25 Hydroxy (Vit-D Deficiency, Fractures)     Status: None   Collection Time: 07/20/22  8:29 AM  Result Value Ref Range   Vit D, 25-Hydroxy 59.0 30.0 - 100.0 ng/mL    Comment: Vitamin D deficiency has been defined by the Institute of Medicine and an Endocrine Society practice guideline as a level of serum 25-OH vitamin D less than 20 ng/mL (1,2). The Endocrine Society went on to further define vitamin D insufficiency as a level between 21 and 29 ng/mL (2). 1. IOM (Institute of Medicine). 2010. Dietary reference    intakes for calcium and D. [place]: The    [employer]. 2. Holick MF, Binkley Nord, Bischoff-Ferrari HA, et al.    Evaluation, treatment, and prevention of vitamin D    deficiency: an Endocrine Society clinical practice    guideline. JCEM. 2011 Jul; 96(7):1911-30.   UA/M w/rflx Culture, Routine     Status: Abnormal   Collection Time: 07/27/22  3:58 PM   Specimen: Urine   Urine  Result Value Ref Range   Specific Gravity, UA 1.014 1.005 - 1.030   pH, UA 6.0 5.0 - 7.5   Color, UA Yellow Yellow   Appearance Ur Clear Clear   Leukocytes,UA 1+ (A) Negative    Protein,UA Negative Negative/Trace   Glucose, UA Negative Negative   Ketones, UA Negative Negative   RBC, UA Negative Negative   Bilirubin, UA Negative Negative   Urobilinogen, Ur 1.0 0.2 - 1.0 mg/dL   Nitrite, UA Negative Negative   Microscopic Examination See below:     Comment: Microscopic was indicated and was performed.   Urinalysis Reflex Comment     Comment: This specimen has reflexed to a Urine Culture.  Microscopic Examination     Status: Abnormal   Collection Time: 07/27/22  3:58 PM   Urine  Result Value Ref Range   WBC, UA 0-5 0 - 5 /hpf   RBC, Urine 0-2 0 - 2 /hpf   Epithelial Cells (non renal) >10 (A) 0 - 10 /hpf   Casts None seen None seen /lpf   Bacteria, UA Few None seen/Few  Urine Culture, Reflex     Status: None   Collection Time: 07/27/22  3:58 PM   Urine  Result Value Ref Range   Urine Culture, Routine Final report    Organism ID, Bacteria Comment     Comment: Mixed urogenital flora 25,000-50,000 colony forming units per mL         Assessment/Plan: 1. Encounter for general adult medical examination with abnormal findings Cpe performed, labs reviewed, due for mammogram  2. Essential hypertension Well controlled, continue current medication  3. OSA on CPAP Continue cpap nightly  4. Boil of trunk Will go ahead and start doxycycline and advised to keep clean and use warm compresses to encourage continued drainage - doxycycline (VIBRA-TABS) 100 MG tablet; Take 1 tablet (100 mg total) by mouth 2 (two) times daily.  Dispense: 20  tablet; Refill: 0  5. Visit for screening mammogram - MM 3D SCREENING MAMMOGRAM BILATERAL BREAST; Future  6. Hiatal hernia Followed by [facility]  7. Morbid obesity with BMI of 45.0-49.9, adult (HCC) Continue to work on diet and exercise will goal of 260lbs for hernia surgery  8. Dysuria - UA/M w/rflx Culture, Routine   General Counseling: Oceanna verbalizes understanding of the findings of todays visit and agrees with plan  of treatment. I have discussed any further diagnostic evaluation that may be needed or ordered today. We also reviewed her medications today. she has been encouraged to call the office with any questions or concerns that should arise related to todays visit.    Counseling:    Orders Placed This Encounter  Procedures   Microscopic Examination   Urine Culture, Reflex   MM 3D SCREENING MAMMOGRAM BILATERAL BREAST   UA/M w/rflx Culture, Routine    Meds ordered this encounter  Medications   doxycycline (VIBRA-TABS) 100 MG tablet    Sig: Take 1 tablet (100 mg total) by mouth 2 (two) times daily.    Dispense:  20 tablet    Refill:  0    This patient was seen by Lynn Ito, PA-C in collaboration with Dr. Beverely Risen as a part of collaborative care agreement.  Total time spent:35 Minutes  Time spent includes review of chart, medications, test results, and follow up plan with the patient.     Lyndon Code, MD  Internal Medicine

## 2022-07-27 NOTE — Patient Instructions (Addendum)
We will have you follow up here in 6 months. We will send you a letter about your appointment.     Please call and ask to speak with a nurse if you develop questions or concerns.

## 2022-07-28 LAB — UA/M W/RFLX CULTURE, ROUTINE
Nitrite, UA: NEGATIVE
Specific Gravity, UA: 1.014 (ref 1.005–1.030)

## 2022-07-28 LAB — MICROSCOPIC EXAMINATION

## 2022-07-29 LAB — UA/M W/RFLX CULTURE, ROUTINE
Bilirubin, UA: NEGATIVE
Glucose, UA: NEGATIVE
Ketones, UA: NEGATIVE
Protein,UA: NEGATIVE
RBC, UA: NEGATIVE
Urobilinogen, Ur: 1 mg/dL (ref 0.2–1.0)
pH, UA: 6 (ref 5.0–7.5)

## 2022-07-29 LAB — MICROSCOPIC EXAMINATION
Casts: NONE SEEN /LPF
Epithelial Cells (non renal): >10 /hpf — AB (ref 0–10)

## 2022-07-29 LAB — URINE CULTURE, REFLEX

## 2022-07-30 ENCOUNTER — Encounter: Payer: BLUE CROSS/BLUE SHIELD | Admitting: Physician Assistant

## 2022-07-30 NOTE — Progress Notes (Signed)
Outpatient Surgical Follow Up  07/30/2022  Shelley [name] is an 62 y.o. female.   Chief Complaint  Patient presents with   Follow-up    HPI: Shelley [name] is a 62 y.o. female following for [facility] and GERD.She continues to endorse GERD but has improved using PPI. She does not have any dysphagia.   She has lost another 2 lbs since our last visit. Marland Kitchen No fevers no chills.  CT and swallow pers reviewed showing small HH w reflux  WEIGHT 283 lbs  Past Medical History:  Diagnosis Date   Anemia    Anxiety    Arthritis    Cardiomegaly    Family history of breast cancer    Family history of ovarian cancer    5/22 cancer genetic testing letter sent   Hypertension    Hyperthyroidism    Pulmonary hypertension (HCC)    Sleep apnea    Thyroid disease    Vertigo     Past Surgical History:  Procedure Laterality Date   COLONOSCOPY WITH PROPOFOL N/A 02/27/2021   Procedure: COLONOSCOPY WITH PROPOFOL;  Surgeon: Midge Minium, MD;  Location: [facility] ENDOSCOPY;  Service: Endoscopy;  Laterality: N/A;   ESOPHAGOGASTRODUODENOSCOPY  02/27/2021   Procedure: ESOPHAGOGASTRODUODENOSCOPY (EGD);  Surgeon: Midge Minium, MD;  Location: [facility] ENDOSCOPY;  Service: Endoscopy;;   RIGHT/LEFT HEART CATH AND CORONARY ANGIOGRAPHY Bilateral 05/26/2019   Procedure: RIGHT/LEFT HEART CATH AND CORONARY ANGIOGRAPHY;  Surgeon: Yvonne Kendall, MD;  Location: [facility] INVASIVE CV LAB;  Service: Cardiovascular;  Laterality: Bilateral;   TUBAL LIGATION      Family History  Problem Relation Age of Onset   Heart attack Mother    Ovarian cancer Mother    Cancer Father    Cancer Sister    Breast cancer Maternal Grandmother    Breast cancer Cousin        1st maternal cousin   Cancer Other     Social History:  reports that she has never smoked. She has never been exposed to tobacco smoke. She has never used smokeless tobacco. She reports that she does not drink alcohol and does not use drugs.  Allergies:  Allergies   Allergen Reactions   Amlodipine     Medications reviewed.    ROS Full ROS performed and is otherwise negative other than what is stated in HPI   BP (!) 141/89   Pulse 86   Temp 98 F (36.7 C)   Ht 5\' 6"  (1.676 m)   Wt 283 lb (128.4 kg)   SpO2 97%   BMI 45.68 kg/m   Physical Exam itals and nursing note reviewed. Exam conducted with a chaperone present.  Constitutional:      General: She is not in acute distress.    Appearance: Normal appearance. She is obese. She is not ill-appearing.  Eyes:     General: No scleral icterus.       Right eye: No discharge.        Left eye: No discharge.  Abdominal:     General: Abdomen is flat. There is no distension.     Palpations: Abdomen is soft. There is no mass.     Tenderness: There is no abdominal tenderness. There is no guarding or rebound.     Hernia: No hernia is present.  Musculoskeletal:        General: Normal range of motion.     Cervical back: Normal range of motion and neck supple. No rigidity or tenderness.  Skin:  General: Skin is warm and dry.     Capillary Refill: Capillary refill takes less than 2 seconds.  Neurological:     General: No focal deficit present.     Mental Status: She is alert and oriented to person, place, and time.  Psychiatric:        Mood and Affect: Mood normal.        Behavior: Behavior normal.        Thought Content: Thought content normal.        Judgment: Judgment normal.   Assessment/Plan: 62 year old female with recalcitrant reflux causing significant cough from some exacerbation of pulmonary symptoms.  She does have evidence of a hiatal hernia but also has high BMI that technically is a contraindication for antireflux and hiatal hernia repair.  I had an extensive discussion with the patient and the daughter about my thought process, options from bariatrics, to medical rx. She wishes to continue exercise and diet in an attempt to optimize her weight prior to potential fundoplications..   She wishes to see me in 6 more months, GOAL of 250-260lbs   Please note that I spent 20 minutes in this encounter including personally reviewing imaging studies, coordinating her care, placing orders and performing appropriate  documentation   Sterling Big, MD [facility] General Surgeon

## 2022-08-07 ENCOUNTER — Ambulatory Visit: Payer: Self-pay

## 2022-08-07 NOTE — Patient Outreach (Signed)
  Care Coordination   Follow Up Visit Note   08/07/2022 Name: Shelley [name] MRN: [id number] DOB: 13-Apr-1960  Shelley [name] is a 62 y.o. year old female who sees McDonough, Salomon Fick, PA-C for primary care. I spoke with  Shelley [name] by phone today.  What matters to the patients health and wellness today?  Patient states she lost 6 lbs by portion control, walking steps and decreasing simple carbs.  She states she needs to lose 20 lbs by November 2024 for hernia surgery. She states her goal is to reach 250 lbs. Denies any new concerns.    Goals Addressed             This Visit's Progress    Care coordination activities - information regarding weigh management/ loss       Interventions Today    Flowsheet Row Most Recent Value  Chronic Disease   Chronic disease during today's visit Hypertension (HTN), Other  [obesity]  General Interventions   General Interventions Discussed/Reviewed General Interventions Reviewed, Doctor Visits  [evaluation of current treatment plan for HTN/ obesity and patients adherence to plan as established by provider.]  Doctor Visits Discussed/Reviewed Doctor Visits Reviewed  [reviewed scheduled / upcoming provider visits.]  Exercise Interventions   Exercise Discussed/Reviewed Physical Activity  Physical Activity Discussed/Reviewed Physical Activity Reviewed  [encouraged patient to increase physical activity/ exercises to 4-5 days per week. Advised patient to consider getting a friend/ family member to exercise with for support.]  Education Interventions   Education Provided Provided Printed Education  [Re-sending [facility] exercise booklet to patient due to not receiving previously.]  Nutrition Interventions   Nutrition Discussed/Reviewed Nutrition Reviewed, Portion sizes, Decreasing sugar intake, Increasing proteins, Adding fruits and vegetables  Pharmacy Interventions   Pharmacy Dicussed/Reviewed Pharmacy Topics Reviewed  [medications reviewed and  compliance discussed.]              SDOH assessments and interventions completed:  No     Care Coordination Interventions:  Yes, provided   Follow up plan: Follow up call scheduled for 09/28/22    Encounter Outcome:  Pt. Visit Completed   George Ina RN,BSN,CCM [facility] Care Coordination [phone] direct line

## 2022-08-26 ENCOUNTER — Telehealth: Payer: Self-pay | Admitting: Physician Assistant

## 2022-08-26 NOTE — Telephone Encounter (Addendum)
Lvm and sent message to move 12/23/22 pft to another day-Toni

## 2022-08-26 NOTE — Telephone Encounter (Signed)
Lvm & sent mychart msg to either move 12/23/22 appt to morning or to another afternoon-tw//

## 2022-10-01 ENCOUNTER — Inpatient Hospital Stay
Admission: EM | Admit: 2022-10-01 | Discharge: 2022-10-04 | DRG: 164 | Disposition: A | Discharge status: Home Health | Payer: BLUE CROSS/BLUE SHIELD | Attending: Osteopathic Medicine | Admitting: Osteopathic Medicine

## 2022-10-01 ENCOUNTER — Emergency Department: Payer: BLUE CROSS/BLUE SHIELD

## 2022-10-01 ENCOUNTER — Encounter: Payer: Self-pay | Admitting: Radiology

## 2022-10-01 ENCOUNTER — Encounter: Payer: Self-pay | Admitting: Physician Assistant

## 2022-10-01 ENCOUNTER — Ambulatory Visit (INDEPENDENT_AMBULATORY_CARE_PROVIDER_SITE_OTHER): Payer: BLUE CROSS/BLUE SHIELD | Admitting: Physician Assistant

## 2022-10-01 ENCOUNTER — Other Ambulatory Visit: Payer: Self-pay

## 2022-10-01 VITALS — BP 160/98 | HR 82 | Temp 98.3°F | Resp 16 | Ht 66.0 in | Wt 293.8 lb

## 2022-10-01 DIAGNOSIS — M79604 Pain in right leg: Secondary | ICD-10-CM | POA: Diagnosis not present

## 2022-10-01 DIAGNOSIS — E039 Hypothyroidism, unspecified: Secondary | ICD-10-CM | POA: Diagnosis present

## 2022-10-01 DIAGNOSIS — F419 Anxiety disorder, unspecified: Secondary | ICD-10-CM | POA: Diagnosis present

## 2022-10-01 DIAGNOSIS — R0902 Hypoxemia: Secondary | ICD-10-CM | POA: Diagnosis not present

## 2022-10-01 DIAGNOSIS — Z888 Allergy status to other drugs, medicaments and biological substances status: Secondary | ICD-10-CM | POA: Diagnosis not present

## 2022-10-01 DIAGNOSIS — Z791 Long term (current) use of non-steroidal anti-inflammatories (NSAID): Secondary | ICD-10-CM | POA: Diagnosis not present

## 2022-10-01 DIAGNOSIS — Z79899 Other long term (current) drug therapy: Secondary | ICD-10-CM

## 2022-10-01 DIAGNOSIS — I270 Primary pulmonary hypertension: Secondary | ICD-10-CM | POA: Diagnosis present

## 2022-10-01 DIAGNOSIS — I2609 Other pulmonary embolism with acute cor pulmonale: Secondary | ICD-10-CM | POA: Diagnosis not present

## 2022-10-01 DIAGNOSIS — R0602 Shortness of breath: Secondary | ICD-10-CM | POA: Diagnosis present

## 2022-10-01 DIAGNOSIS — I2692 Saddle embolus of pulmonary artery without acute cor pulmonale: Secondary | ICD-10-CM | POA: Diagnosis not present

## 2022-10-01 DIAGNOSIS — M1990 Unspecified osteoarthritis, unspecified site: Secondary | ICD-10-CM | POA: Diagnosis present

## 2022-10-01 DIAGNOSIS — I2699 Other pulmonary embolism without acute cor pulmonale: Principal | ICD-10-CM | POA: Diagnosis present

## 2022-10-01 DIAGNOSIS — R079 Chest pain, unspecified: Secondary | ICD-10-CM

## 2022-10-01 DIAGNOSIS — J302 Other seasonal allergic rhinitis: Secondary | ICD-10-CM | POA: Diagnosis present

## 2022-10-01 DIAGNOSIS — I517 Cardiomegaly: Secondary | ICD-10-CM | POA: Diagnosis present

## 2022-10-01 DIAGNOSIS — Z8249 Family history of ischemic heart disease and other diseases of the circulatory system: Secondary | ICD-10-CM | POA: Diagnosis not present

## 2022-10-01 DIAGNOSIS — I272 Pulmonary hypertension, unspecified: Secondary | ICD-10-CM | POA: Diagnosis not present

## 2022-10-01 DIAGNOSIS — I10 Essential (primary) hypertension: Secondary | ICD-10-CM | POA: Diagnosis present

## 2022-10-01 DIAGNOSIS — K219 Gastro-esophageal reflux disease without esophagitis: Secondary | ICD-10-CM | POA: Diagnosis present

## 2022-10-01 DIAGNOSIS — G4733 Obstructive sleep apnea (adult) (pediatric): Secondary | ICD-10-CM | POA: Diagnosis present

## 2022-10-01 DIAGNOSIS — E0590 Thyrotoxicosis, unspecified without thyrotoxic crisis or storm: Secondary | ICD-10-CM | POA: Diagnosis present

## 2022-10-01 DIAGNOSIS — F32A Depression, unspecified: Secondary | ICD-10-CM | POA: Diagnosis present

## 2022-10-01 LAB — APTT: aPTT: 27 seconds (ref 24–36)

## 2022-10-01 LAB — BASIC METABOLIC PANEL
Anion gap: 1 — ABNORMAL LOW (ref 5–15)
BUN: 17 mg/dL (ref 8–23)
CO2: 27 mmol/L (ref 22–32)
Calcium: 8.8 mg/dL — ABNORMAL LOW (ref 8.9–10.3)
Chloride: 113 mmol/L — ABNORMAL HIGH (ref 98–111)
Creatinine, Ser: 0.77 mg/dL (ref 0.44–1.00)
GFR, Estimated: >60 mL/min (ref >60)
Glucose, Bld: 89 mg/dL (ref 70–99)
Potassium: 3.9 mmol/L (ref 3.5–5.1)
Sodium: 141 mmol/L (ref 135–145)

## 2022-10-01 LAB — CBC
HCT: 42.3 % (ref 36.0–46.0)
Hemoglobin: 14.2 g/dL (ref 12.0–15.0)
MCH: 29.1 pg (ref 26.0–34.0)
MCHC: 33.6 g/dL (ref 30.0–36.0)
MCV: 86.7 fL (ref 80.0–100.0)
Platelets: 306 10*3/uL (ref 150–400)
RBC: 4.88 MIL/uL (ref 3.87–5.11)
RDW: 14.9 % (ref 11.5–15.5)
WBC: 14.3 10*3/uL — ABNORMAL HIGH (ref 4.0–10.5)
nRBC: 0 % (ref 0.0–0.2)

## 2022-10-01 LAB — TROPONIN I (HIGH SENSITIVITY)
Troponin I (High Sensitivity): 131 ng/L (ref <18)
Troponin I (High Sensitivity): 135 ng/L (ref <18)

## 2022-10-01 LAB — PROTIME-INR
INR: 1 (ref 0.8–1.2)
Prothrombin Time: 13.5 seconds (ref 11.4–15.2)

## 2022-10-01 LAB — D-DIMER, QUANTITATIVE: D-Dimer, Quant: 13.21 ug/mL-FEU — ABNORMAL HIGH (ref 0.00–0.50)

## 2022-10-01 LAB — HEPARIN LEVEL (UNFRACTIONATED): Heparin Unfractionated: 0.18 IU/mL — ABNORMAL LOW (ref 0.30–0.70)

## 2022-10-01 MED ORDER — ESCITALOPRAM OXALATE 10 MG PO TABS
10.0000 mg | ORAL_TABLET | Freq: Every day | ORAL | Status: DC
  Administered 2022-10-01 – 2022-10-03 (×3): 10 mg via ORAL
  Filled 2022-10-01 (×3): qty 1

## 2022-10-01 MED ORDER — SODIUM CHLORIDE 0.9 % IV SOLN: INTRAVENOUS | Status: DC

## 2022-10-01 MED ORDER — POLYETHYLENE GLYCOL 3350 17 G PO PACK: 17.0000 g | PACK | Freq: Every day | ORAL | Status: DC | PRN

## 2022-10-01 MED ORDER — ALPRAZOLAM 0.5 MG PO TABS: 0.5000 mg | ORAL_TABLET | Freq: Three times a day (TID) | ORAL | Status: DC | PRN

## 2022-10-01 MED ORDER — BISACODYL 10 MG RE SUPP: 10.0000 mg | Freq: Every day | RECTAL | Status: DC | PRN

## 2022-10-01 MED ORDER — ACETAMINOPHEN 650 MG RE SUPP: 650.0000 mg | Freq: Four times a day (QID) | RECTAL | Status: DC | PRN

## 2022-10-01 MED ORDER — HYDROMORPHONE HCL 1 MG/ML IJ SOLN: 1.0000 mg | Freq: Once | INTRAMUSCULAR | Status: DC | PRN

## 2022-10-01 MED ORDER — OXYCODONE HCL 5 MG PO TABS
5.0000 mg | ORAL_TABLET | ORAL | Status: DC | PRN
  Administered 2022-10-02 – 2022-10-03 (×8): 5 mg via ORAL
  Filled 2022-10-01 (×8): qty 1

## 2022-10-01 MED ORDER — ONDANSETRON HCL 4 MG/2ML IJ SOLN: 4.0000 mg | Freq: Four times a day (QID) | INTRAMUSCULAR | Status: DC | PRN

## 2022-10-01 MED ORDER — HEPARIN BOLUS VIA INFUSION
4000.0000 [IU] | Freq: Once | INTRAVENOUS | Status: AC
  Administered 2022-10-01: 4000 [IU] via INTRAVENOUS
  Filled 2022-10-01: qty 4000

## 2022-10-01 MED ORDER — FENTANYL CITRATE PF 50 MCG/ML IJ SOSY: 12.5000 ug | PREFILLED_SYRINGE | Freq: Once | INTRAMUSCULAR | Status: DC | PRN

## 2022-10-01 MED ORDER — ONDANSETRON HCL 4 MG PO TABS: 4.0000 mg | ORAL_TABLET | Freq: Four times a day (QID) | ORAL | Status: DC | PRN

## 2022-10-01 MED ORDER — DIPHENHYDRAMINE HCL 50 MG/ML IJ SOLN: 50.0000 mg | Freq: Once | INTRAMUSCULAR | Status: DC | PRN

## 2022-10-01 MED ORDER — FAMOTIDINE 20 MG PO TABS
10.0000 mg | ORAL_TABLET | Freq: Every day | ORAL | Status: DC
  Administered 2022-10-01 – 2022-10-04 (×4): 10 mg via ORAL
  Filled 2022-10-01 (×4): qty 1

## 2022-10-01 MED ORDER — HYDRALAZINE HCL 20 MG/ML IJ SOLN: 5.0000 mg | Freq: Four times a day (QID) | INTRAMUSCULAR | Status: DC | PRN

## 2022-10-01 MED ORDER — DILTIAZEM HCL ER COATED BEADS 180 MG PO CP24
180.0000 mg | ORAL_CAPSULE | Freq: Every day | ORAL | Status: DC
  Administered 2022-10-01 – 2022-10-04 (×4): 180 mg via ORAL
  Filled 2022-10-01 (×4): qty 1

## 2022-10-01 MED ORDER — METHYLPREDNISOLONE SODIUM SUCC 125 MG IJ SOLR: 125.0000 mg | Freq: Once | INTRAMUSCULAR | Status: DC | PRN

## 2022-10-01 MED ORDER — PANTOPRAZOLE SODIUM 40 MG PO TBEC
40.0000 mg | DELAYED_RELEASE_TABLET | Freq: Every day | ORAL | Status: DC
  Administered 2022-10-02 – 2022-10-04 (×3): 40 mg via ORAL
  Filled 2022-10-01 (×3): qty 1

## 2022-10-01 MED ORDER — DICLOFENAC SODIUM 1 % EX GEL: 4.0000 g | Freq: Four times a day (QID) | CUTANEOUS | Status: DC | PRN

## 2022-10-01 MED ORDER — LORATADINE 10 MG PO TABS
10.0000 mg | ORAL_TABLET | Freq: Every day | ORAL | Status: DC
  Administered 2022-10-01 – 2022-10-04 (×4): 10 mg via ORAL
  Filled 2022-10-01 (×4): qty 1

## 2022-10-01 MED ORDER — CEFAZOLIN SODIUM-DEXTROSE 2-4 GM/100ML-% IV SOLN
2.0000 g | INTRAVENOUS | Status: AC
  Administered 2022-10-02: 2 g via INTRAVENOUS
  Filled 2022-10-01: qty 100

## 2022-10-01 MED ORDER — MIDAZOLAM HCL 2 MG/ML PO SYRP
8.0000 mg | ORAL_SOLUTION | Freq: Once | ORAL | Status: DC | PRN
  Filled 2022-10-01: qty 5

## 2022-10-01 MED ORDER — FAMOTIDINE 20 MG PO TABS: 40.0000 mg | ORAL_TABLET | Freq: Once | ORAL | Status: DC | PRN

## 2022-10-01 MED ORDER — HEPARIN BOLUS VIA INFUSION
2800.0000 [IU] | Freq: Once | INTRAVENOUS | Status: AC
  Administered 2022-10-02: 2800 [IU] via INTRAVENOUS
  Filled 2022-10-01: qty 2800

## 2022-10-01 MED ORDER — ACETAMINOPHEN 325 MG PO TABS
650.0000 mg | ORAL_TABLET | Freq: Four times a day (QID) | ORAL | Status: DC | PRN
  Administered 2022-10-02: 650 mg via ORAL
  Filled 2022-10-01: qty 2

## 2022-10-01 MED ORDER — IOHEXOL 350 MG/ML SOLN
75.0000 mL | Freq: Once | INTRAVENOUS | Status: AC | PRN
  Administered 2022-10-01: 75 mL via INTRAVENOUS

## 2022-10-01 MED ORDER — HEPARIN (PORCINE) 25000 UT/250ML-% IV SOLN
1550.0000 [IU]/h | INTRAVENOUS | Status: AC
  Administered 2022-10-01: 1250 [IU]/h via INTRAVENOUS
  Administered 2022-10-02 – 2022-10-03 (×2): 1550 [IU]/h via INTRAVENOUS
  Filled 2022-10-01 (×3): qty 250

## 2022-10-01 NOTE — ED Notes (Signed)
Patient transported to CT 

## 2022-10-01 NOTE — Progress Notes (Signed)
Patient states family will bring in her home unit because she needs nasal pillows to use cpap

## 2022-10-01 NOTE — Consult Note (Signed)
Hospital Consult    Reason for Consult:  Saddle Pulmonary Embolism  Requesting Physician:  Dr Minna Antis MD MRN #:  [id number]  History of Present Illness: This is a 62 y.o. female w/ PMH HTN, pulmonary hypertension, Anxiety/Depression, GERD, seasonal allergies, arthritis. She presents from home/doctor's office to the ED on 10/01/22  for evaluation of 2 days of worsening shortness of breath intermittent chest pain and had an episode yesterday where she woke up from sleep and was diaphoretic with discomfort. She also endorses pain to her right lower extremity. Patients daughter endorses a 4 hour car ride to the beach last week.   On exam she she's comfortably resting in the emergency department. She denies any chest pain or shortness of breath. Denies any dizziness or blurred vision. She does endorse right leg pain on palpation. Oxygen saturation  94-96% on room air. She endorses using C-PAP with 2 Liters of O2 at night with C-PAP. Patient states she ate lunch earlier this afternoon. No other complaints and vitals all remain stable.    Past Medical History:  Diagnosis Date   Anemia    Anxiety    Arthritis    Cardiomegaly    Family history of breast cancer    Family history of ovarian cancer    5/22 cancer genetic testing letter sent   Hypertension    Hyperthyroidism    Pulmonary hypertension (HCC)    Sleep apnea    Thyroid disease    Vertigo     Past Surgical History:  Procedure Laterality Date   COLONOSCOPY WITH PROPOFOL N/A 02/27/2021   Procedure: COLONOSCOPY WITH PROPOFOL;  Surgeon: Midge Minium, MD;  Location: [facility] ENDOSCOPY;  Service: Endoscopy;  Laterality: N/A;   ESOPHAGOGASTRODUODENOSCOPY  02/27/2021   Procedure: ESOPHAGOGASTRODUODENOSCOPY (EGD);  Surgeon: Midge Minium, MD;  Location: [facility] ENDOSCOPY;  Service: Endoscopy;;   RIGHT/LEFT HEART CATH AND CORONARY ANGIOGRAPHY Bilateral 05/26/2019   Procedure: RIGHT/LEFT HEART CATH AND CORONARY ANGIOGRAPHY;  Surgeon: Yvonne Kendall, MD;  Location: [facility] INVASIVE CV LAB;  Service: Cardiovascular;  Laterality: Bilateral;   TUBAL LIGATION      Allergies  Allergen Reactions   Amlodipine     Prior to Admission medications   Medication Sig Start Date End Date Taking? Authorizing Provider  ALPRAZolam Prudy Feeler) 0.25 MG tablet Take half tab twice a day as needed for severe panic attacks 06/15/22   McDonough, Salomon Fick, PA-C  diclofenac Sodium (VOLTAREN) 1 % GEL SMARTSIG:Gram(s) Topical 4 Times Daily PRN 12/12/20   [provider]  diltiazem (CARDIZEM CD) 180 MG 24 hr capsule TAKE 1 CAPSULE BY MOUTH DAILY 03/27/22   McDonough, Salomon Fick, PA-C  doxycycline (VIBRA-TABS) 100 MG tablet Take 1 tablet (100 mg total) by mouth 2 (two) times daily. 07/27/22   McDonough, Salomon Fick, PA-C  ergocalciferol (DRISDOL) 1.25 MG (50000 UT) capsule Take one cap q week 07/24/21   McDonough, Lauren K, PA-C  escitalopram (LEXAPRO) 10 MG tablet TAKE 1 TABLET BY MOUTH ONCE DAILY WITH  SUPPER  FOR  PANIC  ATTACKS 06/15/22   McDonough, Lauren K, PA-C  famotidine (PEPCID) 10 MG tablet Take 1 tablet (10 mg total) by mouth daily. 06/15/22   McDonough, Lauren K, PA-C  fluticasone (FLONASE) 50 MCG/ACT nasal spray Place 1 spray into both nostrils daily.    [provider]  loratadine (CLARITIN) 10 MG tablet Take 10 mg by mouth daily.    [provider]  meclizine (ANTIVERT) 25 MG tablet Take 25 mg by mouth 3 (three)  times daily as needed. 05/23/20   [provider]  meloxicam (MOBIC) 15 MG tablet Take 1 tablet (15 mg total) by mouth daily as needed. 06/15/22   McDonough, Salomon Fick, PA-C  Multiple Vitamin (MULTIVITAMIN) tablet Take 1 tablet by mouth daily.    [provider]  omeprazole (PRILOSEC) 40 MG capsule TAKE 1 CAPSULE BY MOUTH DAILY FOR HEARTBURN 07/20/22   McDonough, Salomon Fick, PA-C    Social History   Socioeconomic History   Marital status: Single    Spouse name: Not on file   Number of children: Not on file    Years of education: Not on file   Highest education level: Not on file  Occupational History   Not on file  Tobacco Use   Smoking status: Never    Passive exposure: Never   Smokeless tobacco: Never  Vaping Use   Vaping status: Never Used  Substance and Sexual Activity   Alcohol use: No   Drug use: No   Sexual activity: Yes    Birth control/protection: I.U.D.  Other Topics Concern   Not on file  Social History Narrative   Not on file   Social Determinants of Health   Financial Resource Strain: Not on file  Food Insecurity: No Food Insecurity (07/13/2022)   Hunger Vital Sign    Worried About Running Out of Food in the Last Year: Never true    Ran Out of Food in the Last Year: Never true  Transportation Needs: No Transportation Needs (07/13/2022)   PRAPARE - Administrator, Civil Service (Medical): No    Lack of Transportation (Non-Medical): No  Physical Activity: Not on file  Stress: Not on file  Social Connections: Not on file  Intimate Partner Violence: Not on file     Family History  Problem Relation Age of Onset   Heart attack Mother    Ovarian cancer Mother    Cancer Father    Cancer Sister    Breast cancer Maternal Grandmother    Breast cancer Cousin        1st maternal cousin   Cancer Other     ROS: Otherwise negative unless mentioned in HPI  Physical Examination  Vitals:   10/01/22 1227  BP: (!) 157/86  Pulse: 70  Resp: 18  Temp: 98.2 F (36.8 C)  SpO2: 93%   Body mass index is 47.29 kg/m.  General:  WDWN in NAD Gait: Not observed HENT: WNL, normocephalic Pulmonary: normal non-labored breathing, without Rales, rhonchi,  wheezing Cardiac: regular, without  Murmurs, rubs or gallops; without carotid bruits Abdomen: Positive Bowel Sounds throughout,  soft, NT/ND, no masses Skin: without rashes Vascular Exam/Pulses: Bilateral lower extremity palpable pulses. Pain on palpation to right lower extremity calf and thigh.  Extremities:  without ischemic changes, without Gangrene , without cellulitis; without open wounds;  Musculoskeletal: no muscle wasting or atrophy  Neurologic: A&O X 3;  No focal weakness or paresthesias are detected; speech is fluent/normal Psychiatric:  The pt has Normal affect. Lymph:  Unremarkable  CBC    Component Value Date/Time   WBC 14.3 (H) 10/01/2022 1229   RBC 4.88 10/01/2022 1229   HGB 14.2 10/01/2022 1229   HGB 12.7 07/20/2022 0829   HCT 42.3 10/01/2022 1229   HCT 38.0 07/20/2022 0829   PLT 306 10/01/2022 1229   PLT 393 07/20/2022 0829   MCV 86.7 10/01/2022 1229   MCV 86 07/20/2022 0829   MCV 88 04/29/2012 1729   MCH 29.1  10/01/2022 1229   MCHC 33.6 10/01/2022 1229   RDW 14.9 10/01/2022 1229   RDW 13.4 07/20/2022 0829   RDW 14.2 04/29/2012 1729   LYMPHSABS 2.9 07/20/2022 0829   MONOABS 0.8 02/19/2019 1013   EOSABS 0.3 07/20/2022 0829   BASOSABS 0.1 07/20/2022 0829    BMET    Component Value Date/Time   NA 141 10/01/2022 1229   NA 138 07/20/2022 0829   NA 141 04/29/2012 1729   K 3.9 10/01/2022 1229   K 4.0 04/29/2012 1729   CL 113 (H) 10/01/2022 1229   CL 108 (H) 04/29/2012 1729   CO2 27 10/01/2022 1229   CO2 30 04/29/2012 1729   GLUCOSE 89 10/01/2022 1229   GLUCOSE 94 04/29/2012 1729   BUN 17 10/01/2022 1229   BUN 15 07/20/2022 0829   BUN 11 04/29/2012 1729   CREATININE 0.77 10/01/2022 1229   CREATININE 0.75 04/29/2012 1729   CALCIUM 8.8 (L) 10/01/2022 1229   CALCIUM 8.8 04/29/2012 1729   GFRNONAA >60 10/01/2022 1229   GFRNONAA >60 04/29/2012 1729   GFRAA 105 05/23/2019 0915   GFRAA >60 04/29/2012 1729    COAGS: Lab Results  Component Value Date   INR 1.0 02/19/2019     Non-Invasive Vascular Imaging:   EXAM:10/01/2022 CT ANGIOGRAPHY CHEST WITH CONTRAST   TECHNIQUE: Multidetector CT imaging of the chest was performed using the standard protocol during bolus administration of intravenous contrast. Multiplanar CT image reconstructions and MIPs  were obtained to evaluate the vascular anatomy.   RADIATION DOSE REDUCTION: This exam was performed according to the departmental dose-optimization program which includes automated exposure control, adjustment of the mA and/or kV according to patient size and/or use of iterative reconstruction technique.   CONTRAST:  75mL OMNIPAQUE IOHEXOL 350 MG/ML SOLN   COMPARISON:  Chest CT dated May 24, 2020   FINDINGS: Cardiovascular: Nonocclusive saddle pulmonary embolus. Additional bilateral pulmonary emboli seen throughout the lungs in the segmental and subsegmental pulmonary arteries. Mildly elevated RV to LV ratio of1.1. Normal caliber thoracic aorta with no significant atherosclerotic disease. No visible coronary artery calcifications.   Mediastinum/Nodes: Moderate hiatal hernia. Thyroid is unremarkable. No enlarged lymph nodes seen in the chest.   Lungs/Pleura: Central airways are patent. Mild atelectasis. No consolidation, pleural effusion or pneumothorax.   Upper Abdomen: Atrophic spleen with nodular contour, unchanged when compared with the prior exam. No acute abnormality.   Musculoskeletal: No chest wall abnormality. No acute or significant osseous findings.   Review of the MIP images confirms the above findings.   IMPRESSION: 1. Nonocclusive saddle pulmonary embolus. Additional bilateral pulmonary emboli seen throughout the lungs in the segmental and subsegmental pulmonary arteries. 2. Mildly elevated RV to LV ratio of 1.1, suggestive of right heart strain.   Critical Value/emergent results were called by telephone at the time of interpretation on 10/01/2022 at 3:47 pm to provider Dr. Minna Antis, who verbally acknowledged these results.  Statin:  No. Beta Blocker:  No. Aspirin:  No. ACEI:  No. ARB:  No. CCB use:  Yes Other antiplatelets/anticoagulants:  No.    ASSESSMENT/PLAN: This is a 62 y.o. female who presents to [facility] emergency department from  home/Drs. Office for evaluation of 2 days of worsening shortness of breath with intermittent chest pain that made her diaphoretic and very uncomfortable.  Upon workup patient's D-dimer troponins were elevated and on CTA of the chest she has noted to have a saddle pulmonary embolus.  PLAN: Patient started on a heparin infusion this afternoon  in the emergency department.  Lab work has already been drawn by the emergency department physician.  Vascular surgery plans on taking the patient to the vascular lab tomorrow morning 10/02/2022.  I discussed in detail with the patient and her daughters at the bedside the procedure, benefits, risks, and complications.  All verbalized her understanding this afternoon.  They would like to proceed with the procedure as soon as possible.  I answered all of their questions this afternoon.  Patient will be made n.p.o. after midnight for procedure tomorrow morning.  Patient is also being sent for bilateral lower extremity ultrasounds to rule out DVT.  If the patient is found to have a large lower extremity DVT we may possibly place an inferior vena cava filter at the same time.  This did was discussed with the patient and her daughters at the bedside today as well.  They verbalized her understanding.  They are all in agreement with placement of the filter if needed.   -I discussed the plan in detail with Dr. Festus Barren MD and he agrees with the plan.   Marcie Bal [facility] 10/01/2022 4:09 PM

## 2022-10-01 NOTE — ED Triage Notes (Signed)
Pt sts that she has been SOB since yesterday. Pt sts that she just came from her PCP office and they advised her to come to the ED.

## 2022-10-01 NOTE — ED Provider Notes (Signed)
-----------------------------------------   3:19 PM on 10/01/2022 ----------------------------------------- Patient care assumed from Dr. Roxan Hockey.  Patient with 2 days of shortness of breath since awaking from her sleep diaphoretic and short of breath 2 days ago.  States her shortness of breath is somewhat intermittent.  Lab work shows elevated troponin however largely unchanged on repeat D-dimer is significantly elevated at 13.2.  CBC shows slight leukocytosis chemistry shows no significant findings.  Patient's chest x-ray was clear however given the patient's acute onset with diaphoresis and shortness of breath since a CTA of the chest has been ordered to evaluate for pulmonary embolism.  I have reviewed and interpreted the CTA images of the chest.  Patient appears to have pulmonary emboli including saddle embolus.  Patient satting 93% currently on room air respiratory rate 18.  We will admit to the hospital service.  We will discuss with vascular surgery to see if any further intervention is warranted.  Heparin drip has been started by Dr. Roxan Hockey.  I spoke with Dr. Wyn Quaker of vascular surgery who will be down to see the patient after he is out of the OR, likely will require thrombectomy.  I spoke to the hospitalist will be admitting to their service.     Minna Antis, MD 10/01/22 1537

## 2022-10-01 NOTE — H&P (Signed)
HISTORY AND PHYSICAL    Shelley [name]   ZOX:[id number] DOB: 1960/05/30   Date of Service: 10/01/22 Requesting physician/APP from ED: Treatment Team:  Attending Provider: Sunnie Nielsen, DO  PCP: Carlean Jews, PA-C     HPI: Shelley [name] is a 62 y.o. female w/ PMH HTN, pulmonary hypertension, Anxiety/Depression, GERD, seasonal allergies, arthritis. She presents from home/doctor's office to the ED on 10/01/22  for evaluation of 2 days of worsening shortness of breath intermittent chest pain and had an episode yesterday where she woke up from sleep and was diaphoretic with discomfort.  Has been having some swelling in her lower leg as well.  Does wear CPAP at home.  Has not had any cough or major congestion.  No fevers. Reports R leg pain few weeks, has been following w/ ortho and less mobile than usual.   ED course: VSS, CBC showed WBC 14, BMP Cl 113 and Calcium 8.8, AG 1, otherwise WNL. Troponin 131 --> 135. D-Dimer 13.21 and CTA (+)saddle PE. EDP spoke w/ vascular surgery (Dr Wyn Quaker), full eval pending but anticipate procedure. Started on heparin gtt. Hospitalist consulted for admission.    Consultants:  Vascular surgery   Procedures: None at this time, anticipate possible thrombectomy       ASSESSMENT & PLAN:   Principal Problem:   Acute saddle pulmonary embolism (HCC) Active Problems:   Hypertension   Acquired hypothyroidism   Seasonal allergies   Cardiomegaly   Pulmonary hypertension, primary (HCC)   Shortness of breath   OSA (obstructive sleep apnea)   Gastroesophageal reflux disease without esophagitis   Anxiety and depression   Pulmonary embolism (HCC)  Acute saddle pulmonary embolism (HCC) Shortness of breath without respiratory failure  Heparin gtt Vascular consult, may need thrombectomy  Echocardiogram  Eval for DVT w/ [place] bilateral LE   Cardiomegaly Pulmonary hypertension, primary (HCC) Echo pending  Hypertension BP slightly above  normal/goal Continue home diltiazem pending Echo, adjust appropriately if concern for CHF   Leukocytosis Question reactive, no infectious source noted  Monitor CBC  Acquired hypothyroidism On problem list, no meds listed TSH  Seasonal allergies Continue home loratadine  OSA (obstructive sleep apnea) CPAP at bedtime   Gastroesophageal reflux disease without esophagitis Continue home famotidine, omeprazole or formulary PPI  Anxiety and depression Continue home escitalopram and alprazolam   Osteoarthritis Hold po NSAID for now but topical NSAID ok Pain management w/ PRN acetaminophen mild/moderate pain, oxycodone severe pain     DVT prophylaxis: heparin gtt for PE Pertinent IV fluids/nutrition: NPO for now pending possible thrombectomy  Central lines / invasive devices: none  Code Status: FULL CODE - pt does not have advanced directive, she agrees for her children to make medical decisions if needed  Family Communication: family at bedside  Disposition: inpatient TOC needs: none at this time, anticipate d/c to home, may need HH will ask PT/OT to evaluate if needed Barriers to discharge / significant pending items: on heparin gtt for PE, pending likely thrombectomy                   Review of Systems:  Review of Systems  Constitutional:  Positive for diaphoresis. Negative for chills, fever and malaise/fatigue.  Respiratory:  Positive for shortness of breath. Negative for cough and hemoptysis.   Cardiovascular:  Positive for chest pain and leg swelling. Negative for palpitations, orthopnea and claudication.  Gastrointestinal:  Negative for abdominal pain, constipation, diarrhea, heartburn and nausea.  Genitourinary:  Negative for frequency  and urgency.  Musculoskeletal:  Positive for myalgias. Negative for falls.  Skin:  Negative for itching and rash.  Neurological:  Negative for dizziness and focal weakness.  Psychiatric/Behavioral:  Negative for  depression. The patient is nervous/anxious.        has a past medical history of Anemia, Anxiety, Arthritis, Cardiomegaly, Family history of breast cancer, Family history of ovarian cancer, Hypertension, Hyperthyroidism, Pulmonary hypertension (HCC), Sleep apnea, Thyroid disease, and Vertigo.  Current Outpatient Medications  Medication Instructions   ALPRAZolam (XANAX) 0.25 MG tablet Take half tab twice a day as needed for severe panic attacks   diclofenac Sodium (VOLTAREN) 1 % GEL SMARTSIG:Gram(s) Topical 4 Times Daily PRN   diltiazem (CARDIZEM CD) 180 mg, Oral, Daily   doxycycline (VIBRA-TABS) 100 mg, Oral, 2 times daily   ergocalciferol (DRISDOL) 1.25 MG (50000 UT) capsule Take one cap q week   escitalopram (LEXAPRO) 10 MG tablet TAKE 1 TABLET BY MOUTH ONCE DAILY WITH  SUPPER  FOR  PANIC  ATTACKS   famotidine (PEPCID) 10 mg, Oral, Daily   fluticasone (FLONASE) 50 MCG/ACT nasal spray 1 spray, Each Nare, Daily   loratadine (CLARITIN) 10 mg, Oral, Daily   meclizine (ANTIVERT) 25 mg, Oral, 3 times daily PRN   meloxicam (MOBIC) 15 mg, Oral, Daily PRN   Multiple Vitamin (MULTIVITAMIN) tablet 1 tablet, Oral, Daily   omeprazole (PRILOSEC) 40 MG capsule TAKE 1 CAPSULE BY MOUTH DAILY FOR HEARTBURN   Allergies  Allergen Reactions   Amlodipine    family history includes Breast cancer in her cousin and maternal grandmother; Cancer in her father, sister, and another family member; Heart attack in her mother; Ovarian cancer in her mother.  Past Surgical History:  Procedure Laterality Date   COLONOSCOPY WITH PROPOFOL N/A 02/27/2021   Procedure: COLONOSCOPY WITH PROPOFOL;  Surgeon: Midge Minium, MD;  Location: [facility] ENDOSCOPY;  Service: Endoscopy;  Laterality: N/A;   ESOPHAGOGASTRODUODENOSCOPY  02/27/2021   Procedure: ESOPHAGOGASTRODUODENOSCOPY (EGD);  Surgeon: Midge Minium, MD;  Location: [facility] ENDOSCOPY;  Service: Endoscopy;;   RIGHT/LEFT HEART CATH AND CORONARY ANGIOGRAPHY Bilateral 05/26/2019    Procedure: RIGHT/LEFT HEART CATH AND CORONARY ANGIOGRAPHY;  Surgeon: Yvonne Kendall, MD;  Location: [facility] INVASIVE CV LAB;  Service: Cardiovascular;  Laterality: Bilateral;   TUBAL LIGATION            Objective Findings:  Vitals:   10/01/22 1225 10/01/22 1227  BP:  (!) 157/86  Pulse:  70  Resp:  18  Temp:  98.2 F (36.8 C)  TempSrc:  Oral  SpO2:  93%  Weight: 132.9 kg   Height: 5\' 6"  (1.676 m)    No intake or output data in the 24 hours ending 10/01/22 1628 Filed Weights   10/01/22 1225  Weight: 132.9 kg    Examination:  Physical Exam Constitutional:      General: She is not in acute distress.    Appearance: She is well-developed. She is obese.  Cardiovascular:     Rate and Rhythm: Normal rate and regular rhythm.  Pulmonary:     Effort: Pulmonary effort is normal.     Breath sounds: Normal breath sounds.  Abdominal:     Palpations: Abdomen is soft. There is no mass.     Tenderness: There is no guarding.  Musculoskeletal:     Right lower leg: Tenderness present. Edema present.     Left lower leg: No tenderness. No edema.  Skin:    General: Skin is warm and dry.  Neurological:  General: No focal deficit present.     Mental Status: She is alert and oriented to person, place, and time.  Psychiatric:        Mood and Affect: Mood normal.        Behavior: Behavior normal.          Scheduled Medications:   diltiazem  180 mg Oral Daily   escitalopram  10 mg Oral QHS   famotidine  10 mg Oral Daily   heparin  4,000 Units Intravenous Once   loratadine  10 mg Oral Daily   pantoprazole  40 mg Oral Daily    Continuous Infusions:  heparin      PRN Medications:  acetaminophen **OR** acetaminophen, ALPRAZolam, bisacodyl, diclofenac Sodium, hydrALAZINE, ondansetron **OR** ondansetron (ZOFRAN) IV, oxyCODONE, polyethylene glycol  Antimicrobials:  Anti-infectives (From admission, onward)    None           Data Reviewed: I have personally  reviewed following labs and imaging studies  CBC: Recent Labs  Lab 10/01/22 1229  WBC 14.3*  HGB 14.2  HCT 42.3  MCV 86.7  PLT 306   Basic Metabolic Panel: Recent Labs  Lab 10/01/22 1229  NA 141  K 3.9  CL 113*  CO2 27  GLUCOSE 89  BUN 17  CREATININE 0.77  CALCIUM 8.8*   GFR: Estimated Creatinine Clearance: 102.1 mL/min (by C-G formula based on SCr of 0.77 mg/dL). Liver Function Tests: No results for input(s): "AST", "ALT", "ALKPHOS", "BILITOT", "PROT", "ALBUMIN" in the last 168 hours. No results for input(s): "LIPASE", "AMYLASE" in the last 168 hours. No results for input(s): "AMMONIA" in the last 168 hours. Coagulation Profile: Recent Labs  Lab 10/01/22 1421  INR 1.0   Cardiac Enzymes: No results for input(s): "CKTOTAL", "CKMB", "CKMBINDEX", "TROPONINI" in the last 168 hours. BNP (last 3 results) No results for input(s): "PROBNP" in the last 8760 hours. HbA1C: No results for input(s): "HGBA1C" in the last 72 hours. CBG: No results for input(s): "GLUCAP" in the last 168 hours. Lipid Profile: No results for input(s): "CHOL", "HDL", "LDLCALC", "TRIG", "CHOLHDL", "LDLDIRECT" in the last 72 hours. Thyroid Function Tests: No results for input(s): "TSH", "T4TOTAL", "FREET4", "T3FREE", "THYROIDAB" in the last 72 hours. Anemia Panel: No results for input(s): "VITAMINB12", "FOLATE", "FERRITIN", "TIBC", "IRON", "RETICCTPCT" in the last 72 hours. Most Recent Urinalysis On File:     Component Value Date/Time   COLORURINE YELLOW (A) 12/31/2014 2340   APPEARANCEUR Clear 07/27/2022 1558   LABSPEC 1.011 12/31/2014 2340   PHURINE 5.0 12/31/2014 2340   GLUCOSEU Negative 07/27/2022 1558   HGBUR NEGATIVE 12/31/2014 2340   BILIRUBINUR Negative 07/27/2022 1558   KETONESUR NEGATIVE 12/31/2014 2340   PROTEINUR Negative 07/27/2022 1558   PROTEINUR NEGATIVE 12/31/2014 2340   UROBILINOGEN 0.2 07/31/2007 1750   NITRITE Negative 07/27/2022 1558   NITRITE NEGATIVE 12/31/2014 2340    LEUKOCYTESUR 1+ (A) 07/27/2022 1558   Sepsis Labs: @LABRCNTIP (procalcitonin:4,lacticidven:4)  No results found for this or any previous visit (from the past 240 hour(s)).       Radiology Studies: CT Angio Chest Pulmonary Embolism (PE) W or WO Contrast  Result Date: 10/01/2022 CLINICAL DATA:  Pulmonary embolus suspected EXAM: CT ANGIOGRAPHY CHEST WITH CONTRAST TECHNIQUE: Multidetector CT imaging of the chest was performed using the standard protocol during bolus administration of intravenous contrast. Multiplanar CT image reconstructions and MIPs were obtained to evaluate the vascular anatomy. RADIATION DOSE REDUCTION: This exam was performed according to the departmental dose-optimization program which includes automated exposure control,  adjustment of the mA and/or kV according to patient size and/or use of iterative reconstruction technique. CONTRAST:  75mL OMNIPAQUE IOHEXOL 350 MG/ML SOLN COMPARISON:  Chest CT dated May 24, 2020 FINDINGS: Cardiovascular: Nonocclusive saddle pulmonary embolus. Additional bilateral pulmonary emboli seen throughout the lungs in the segmental and subsegmental pulmonary arteries. Mildly elevated RV to LV ratio of1.1. Normal caliber thoracic aorta with no significant atherosclerotic disease. No visible coronary artery calcifications. Mediastinum/Nodes: Moderate hiatal hernia. Thyroid is unremarkable. No enlarged lymph nodes seen in the chest. Lungs/Pleura: Central airways are patent. Mild atelectasis. No consolidation, pleural effusion or pneumothorax. Upper Abdomen: Atrophic spleen with nodular contour, unchanged when compared with the prior exam. No acute abnormality. Musculoskeletal: No chest wall abnormality. No acute or significant osseous findings. Review of the MIP images confirms the above findings. IMPRESSION: 1. Nonocclusive saddle pulmonary embolus. Additional bilateral pulmonary emboli seen throughout the lungs in the segmental and subsegmental pulmonary  arteries. 2. Mildly elevated RV to LV ratio of 1.1, suggestive of right heart strain. Critical Value/emergent results were called by telephone at the time of interpretation on 10/01/2022 at 3:47 pm to provider Dr. Minna Antis, who verbally acknowledged these results. Electronically Signed   By: Allegra Lai M.D.   On: 10/01/2022 15:49   DG Chest 2 View  Result Date: 10/01/2022 CLINICAL DATA:  Shortness of breath. EXAM: CHEST - 2 VIEW COMPARISON:  11/27/2019. FINDINGS: Low lung volumes accentuate the pulmonary vasculature and cardiomediastinal silhouette. No consolidation or pulmonary edema. No pleural effusion or pneumothorax. IMPRESSION: Low lung volumes without evidence of acute cardiopulmonary disease. Electronically Signed   By: Orvan Falconer M.D.   On: 10/01/2022 14:03             LOS: 0 days       Sunnie Nielsen, DO [facility] 10/01/2022, 4:28 PM    Dictation software may have been used to generate the above note. Typos may occur and escape review in typed/dictated notes. Please contact Dr Lyn Hollingshead directly for clarity if needed.  Staff may message me via secure chat in Epic  but this may not receive an immediate response,  please page me for urgent matters!  If 7PM-7AM, please contact night coverage [email or web address]

## 2022-10-01 NOTE — ED Provider Notes (Addendum)
[facility] Provider Note    Event Date/Time   First MD Initiated Contact with Patient 10/01/22 1323     (approximate)   History   Shortness of Breath   HPI  Shelley [name] is a 62 y.o. female who presents to the ER for evaluation of 2 days of worsening shortness of breath intermittent chest pain and had an episode yesterday where she woke up from sleep and was diaphoretic with discomfort.  Has been having some swelling in her lower leg as well.  Does wear CPAP at home.  Has not had any cough or major congestion.  No fevers.     Physical Exam   Triage Vital Signs: ED Triage Vitals  Encounter Vitals Group     BP 10/01/22 1227 (!) 157/86     Systolic BP Percentile --      Diastolic BP Percentile --      Pulse Rate 10/01/22 1227 70     Resp 10/01/22 1227 18     Temp 10/01/22 1227 98.2 F (36.8 C)     Temp Source 10/01/22 1227 Oral     SpO2 10/01/22 1227 93 %     Weight 10/01/22 1225 293 lb (132.9 kg)     Height 10/01/22 1225 5\' 6"  (1.676 m)     Head Circumference --      Peak Flow --      Pain Score 10/01/22 1225 8     Pain Loc --      Pain Education --      Exclude from Growth Chart --     Most recent vital signs: Vitals:   10/01/22 1227  BP: (!) 157/86  Pulse: 70  Resp: 18  Temp: 98.2 F (36.8 C)  SpO2: 93%     Constitutional: Alert  Eyes: Conjunctivae are normal.  Head: Atraumatic. Nose: No congestion/rhinnorhea. Mouth/Throat: Mucous membranes are moist.   Neck: Painless ROM.  Cardiovascular:   Good peripheral circulation. Respiratory: Normal respiratory effort.  No retractions.  Gastrointestinal: Soft and nontender.  Musculoskeletal:  no deformity Neurologic:  MAE spontaneously. No gross focal neurologic deficits are appreciated.  Skin:  Skin is warm, dry and intact. No rash noted. Psychiatric: Mood and affect are normal. Speech and behavior are normal.    ED Results / Procedures / Treatments   Labs (all labs  ordered are listed, but only abnormal results are displayed) Labs Reviewed  BASIC METABOLIC PANEL - Abnormal; Notable for the following components:      Result Value   Chloride 113 (*)    Calcium 8.8 (*)    Anion gap 1 (*)    All other components within normal limits  CBC - Abnormal; Notable for the following components:   WBC 14.3 (*)    All other components within normal limits  D-DIMER, QUANTITATIVE - Abnormal; Notable for the following components:   D-Dimer, Quant 13.21 (*)    All other components within normal limits  TROPONIN I (HIGH SENSITIVITY) - Abnormal; Notable for the following components:   Troponin I (High Sensitivity) 131 (*)    All other components within normal limits  TROPONIN I (HIGH SENSITIVITY) - Abnormal; Notable for the following components:   Troponin I (High Sensitivity) 135 (*)    All other components within normal limits     EKG  ED ECG REPORT I, Willy Eddy, the attending physician, personally viewed and interpreted this ECG.   Date: 10/01/2022  EKG Time: 12:25  Rate: 70  Rhythm: sinus  Axis: righ  Intervals: normal  ST&T Change: no stemi, no depressions    RADIOLOGY Please see ED Course for my review and interpretation.  I personally reviewed all radiographic images ordered to evaluate for the above acute complaints and reviewed radiology reports and findings.  These findings were personally discussed with the patient.  Please see medical record for radiology report.    PROCEDURES:  Critical Care performed: No  .Critical Care  Performed by: Willy Eddy, MD Authorized by: Willy Eddy, MD   Critical care provider statement:    Critical care time (minutes):  40   Critical care was necessary to treat or prevent imminent or life-threatening deterioration of the following conditions:  Respiratory failure   Critical care was time spent personally by me on the following activities:  Ordering and performing treatments and  interventions, ordering and review of laboratory studies, ordering and review of radiographic studies, pulse oximetry, re-evaluation of patient's condition, review of old charts, obtaining history from patient or surrogate, examination of patient, evaluation of patient's response to treatment, discussions with primary provider, discussions with consultants and development of treatment plan with patient or surrogate    MEDICATIONS ORDERED IN ED: Medications  iohexol (OMNIPAQUE) 350 MG/ML injection 75 mL (75 mLs Intravenous Contrast Given 10/01/22 1457)     IMPRESSION / MDM / ASSESSMENT AND PLAN / ED COURSE  I reviewed the triage vital signs and the nursing notes.                              Differential diagnosis includes, but is not limited to, ACS, pericarditis, esophagitis, boerhaaves, pe, dissection, pna, bronchitis, costochondritis  Patient presenting to the ER for evaluation of symptoms as described above.  Based on symptoms, risk factors and considered above differential, this presenting complaint could reflect a potentially life-threatening illness therefore the patient will be placed on continuous pulse oximetry and telemetry for monitoring.  Laboratory evaluation will be sent to evaluate for the above complaints.      Clinical Course as of 10/01/22 1513  Thu Oct 01, 2022  1502 Dimer is significantly elevated.  Patient to CT for further evaluation. [PR]    Clinical Course User Index [PR] Willy Eddy, MD   CT imaging on my review and interpretation shows evidence of pulmonary embolism will order heparin.  Will consult hospitalist for admission.  FINAL CLINICAL IMPRESSION(S) / ED DIAGNOSES   Final diagnoses:  PE (pulmonary thromboembolism) (HCC)     Rx / DC Orders   ED Discharge Orders     None        Note:  This document was prepared using Dragon voice recognition software and may include unintentional dictation errors.    Willy Eddy, MD 10/01/22  1512    Willy Eddy, MD 10/01/22 [phone]

## 2022-10-01 NOTE — Consult Note (Addendum)
ANTICOAGULATION CONSULT NOTE - Initial Consult  Pharmacy Consult for Heparin Indication: chest pain/ACS/pulmonary embolism   Allergies  Allergen Reactions   Amlodipine     Patient Measurements: Height: 5\' 6"  (167.6 cm) Weight: 132.9 kg (293 lb) IBW/kg (Calculated) : 59.3 Heparin Dosing Weight: 91.8 kg  Vital Signs: Temp: 98.2 F (36.8 C) (07/25 1227) Temp Source: Oral (07/25 1227) BP: 157/86 (07/25 1227) Pulse Rate: 70 (07/25 1227)  Labs: Recent Labs    10/01/22 1229 10/01/22 1427  HGB 14.2  --   HCT 42.3  --   PLT 306  --   CREATININE 0.77  --   TROPONINIHS 131* 135*    Estimated Creatinine Clearance: 102.1 mL/min (by C-G formula based on SCr of 0.77 mg/dL).   Medical History: Past Medical History:  Diagnosis Date   Anemia    Anxiety    Arthritis    Cardiomegaly    Family history of breast cancer    Family history of ovarian cancer    5/22 cancer genetic testing letter sent   Hypertension    Hyperthyroidism    Pulmonary hypertension (HCC)    Sleep apnea    Thyroid disease    Vertigo     Medications:  (Not in a hospital admission)  Scheduled:  Infusions:  PRN:  Anti-infectives (From admission, onward)    None       Assessment: Pharmacy is consulted to start heparin for ACS. 62 y.o. female who presents to the ER for evaluation of 2 days of worsening shortness of breath intermittent chest pain and had an episode yesterday where she woke up from sleep and was diaphoretic with discomfort. Outpatient provider was concern for blood clot due to right calf pain. PMH: HTN, pulm HTN, and thrombocytosis. No DOAC PTA. Trop elevated at 135. D-dimer 13.21 Baseline labs ordered. CT chest in progress.   Goal of Therapy:  Heparin level 0.3-0.7 units/ml Monitor platelets by anticoagulation protocol: Yes   Plan:  Give 4000 units bolus x 1 Start heparin infusion at 1250 units/hr Check anti-Xa level in 6 hours and daily while on heparin Continue to monitor  H&H and platelets  Ronnald Ramp, PharmD, BCPS 10/01/2022,3:13 PM

## 2022-10-01 NOTE — Consult Note (Signed)
ANTICOAGULATION CONSULT NOTE  Pharmacy Consult for Heparin Indication: chest pain/ACS/pulmonary embolism   Allergies  Allergen Reactions   Amlodipine     Patient Measurements: Height: 5\' 6"  (167.6 cm) Weight: (!) 137 kg (302 lb 0.5 oz) IBW/kg (Calculated) : 59.3 Heparin Dosing Weight: 91.8 kg  Vital Signs: Temp: 97.8 F (36.6 C) (07/25 1908) Temp Source: Oral (07/25 1227) BP: 138/86 (07/25 1908) Pulse Rate: 71 (07/25 1908)  Labs: Recent Labs    10/01/22 1229 10/01/22 1421 10/01/22 1427 10/01/22 2257  HGB 14.2  --   --   --   HCT 42.3  --   --   --   PLT 306  --   --   --   APTT  --  27  --   --   LABPROT  --  13.5  --   --   INR  --  1.0  --   --   HEPARINUNFRC  --   --   --  0.18*  CREATININE 0.77  --   --   --   TROPONINIHS 131*  --  135*  --     Estimated Creatinine Clearance: 104.1 mL/min (by C-G formula based on SCr of 0.77 mg/dL).   Medical History: Past Medical History:  Diagnosis Date   Anemia    Anxiety    Arthritis    Cardiomegaly    Family history of breast cancer    Family history of ovarian cancer    5/22 cancer genetic testing letter sent   Hypertension    Hyperthyroidism    Pulmonary hypertension (HCC)    Sleep apnea    Thyroid disease    Vertigo     Medications:  Medications Prior to Admission  Medication Sig Dispense Refill Last Dose   ALPRAZolam (XANAX) 0.25 MG tablet Take half tab twice a day as needed for severe panic attacks 30 tablet 0 10/01/2022   diltiazem (CARDIZEM CD) 180 MG 24 hr capsule TAKE 1 CAPSULE BY MOUTH DAILY 90 capsule 1 09/30/2022   escitalopram (LEXAPRO) 10 MG tablet TAKE 1 TABLET BY MOUTH ONCE DAILY WITH  SUPPER  FOR  PANIC  ATTACKS 90 tablet 2 09/30/2022   famotidine (PEPCID) 10 MG tablet Take 1 tablet (10 mg total) by mouth daily. 90 tablet 1 09/30/2022   fluticasone (FLONASE) 50 MCG/ACT nasal spray Place 1 spray into both nostrils daily.   09/30/2022   loratadine (CLARITIN) 10 MG tablet Take 10 mg by mouth  daily.   Past Week   meloxicam (MOBIC) 15 MG tablet Take 1 tablet (15 mg total) by mouth daily as needed. 90 tablet 1 09/30/2022   Multiple Vitamin (MULTIVITAMIN) tablet Take 1 tablet by mouth daily.   09/30/2022   omeprazole (PRILOSEC) 40 MG capsule TAKE 1 CAPSULE BY MOUTH DAILY FOR HEARTBURN 30 capsule 2 10/01/2022   diclofenac Sodium (VOLTAREN) 1 % GEL SMARTSIG:Gram(s) Topical 4 Times Daily PRN (Patient not taking: Reported on 10/01/2022)   Not Taking   doxycycline (VIBRA-TABS) 100 MG tablet Take 1 tablet (100 mg total) by mouth 2 (two) times daily. (Patient not taking: Reported on 10/01/2022) 20 tablet 0 Completed Course   ergocalciferol (DRISDOL) 1.25 MG (50000 UT) capsule Take one cap q week (Patient not taking: Reported on 10/01/2022) 12 capsule 3 Completed Course   meclizine (ANTIVERT) 25 MG tablet Take 25 mg by mouth 3 (three) times daily as needed. (Patient not taking: Reported on 10/01/2022)   Completed Course   Scheduled:  Infusions:  PRN:  Anti-infectives (From admission, onward)    Start     Dose/Rate Route Frequency Ordered Stop   10/02/22 0600  ceFAZolin (ANCEF) IVPB 2g/100 mL premix        2 g 200 mL/hr over 30 Minutes Intravenous 30 min pre-op 10/01/22 1700         Assessment: Pharmacy is consulted to start heparin for ACS. 62 y.o. female who presents to the ER for evaluation of 2 days of worsening shortness of breath intermittent chest pain and had an episode yesterday where she woke up from sleep and was diaphoretic with discomfort. Outpatient provider was concern for blood clot due to right calf pain. PMH: HTN, pulm HTN, and thrombocytosis. No DOAC PTA. Trop elevated at 135. D-dimer 13.21 Baseline labs ordered. CT chest in progress.   Goal of Therapy:  Heparin level 0.3-0.7 units/ml Monitor platelets by anticoagulation protocol: Yes   07/25 2257 HL 0.18, subtherapeutic  Plan:  Bolus 2800 units x 1 Increase heparin infusion to 1550 Recheck HL w/ AM labs after rate  change CBC daily while on heparin  Otelia Sergeant, PharmD, [facility] 10/01/2022 11:50 PM

## 2022-10-01 NOTE — Progress Notes (Signed)
Received pt into room 250. Pt is alert and oriented.

## 2022-10-01 NOTE — ED Notes (Signed)
Roxan Hockey, MD, made aware of troponin 131

## 2022-10-01 NOTE — Progress Notes (Signed)
[facility] [address] [place], [place] [address]  Internal MEDICINE  Office Visit Note  Patient Name: Shelley [name]  [id number]  [id number]  Date of Service: 10/01/2022  Chief Complaint  Patient presents with   Acute Visit    Tightness in chest and SOB since yesterday     HPI Pt is here for a sick visit with her daughter -Started having chest tightness and SOB yesterday -woke up short winded and went to work and took it easy, but SOB continued -No congestion, coughing, wheezing. Oxygen is down at 92% which is low for her -BP 156/90 on recheck -did wake up sweating past 2 nights -no anxiety or panic attack, does not feel stressed -right calf pain and pt reports that my hands on exam seemed very cold to her, but were not very cold to her daughter in the room. Does report having knee pain last week and had xrays and was given a cortisone shot in right knee, otherwise no changes -Bp has been very well controlled last visit and now is running high with SOB and pain -Advised to go to ED due to concern for possible blot clot vs progression of hiatal hernia causing her SOB and CP   Current Medication:  Outpatient Encounter Medications as of 10/01/2022  Medication Sig   ALPRAZolam (XANAX) 0.25 MG tablet Take half tab twice a day as needed for severe panic attacks   diclofenac Sodium (VOLTAREN) 1 % GEL SMARTSIG:Gram(s) Topical 4 Times Daily PRN   diltiazem (CARDIZEM CD) 180 MG 24 hr capsule TAKE 1 CAPSULE BY MOUTH DAILY   doxycycline (VIBRA-TABS) 100 MG tablet Take 1 tablet (100 mg total) by mouth 2 (two) times daily.   ergocalciferol (DRISDOL) 1.25 MG (50000 UT) capsule Take one cap q week   escitalopram (LEXAPRO) 10 MG tablet TAKE 1 TABLET BY MOUTH ONCE DAILY WITH  SUPPER  FOR  PANIC  ATTACKS   famotidine (PEPCID) 10 MG tablet Take 1 tablet (10 mg total) by mouth daily.   fluticasone (FLONASE) 50 MCG/ACT nasal spray Place 1 spray into both nostrils daily.    loratadine (CLARITIN) 10 MG tablet Take 10 mg by mouth daily.   meclizine (ANTIVERT) 25 MG tablet Take 25 mg by mouth 3 (three) times daily as needed.   meloxicam (MOBIC) 15 MG tablet Take 1 tablet (15 mg total) by mouth daily as needed.   Multiple Vitamin (MULTIVITAMIN) tablet Take 1 tablet by mouth daily.   omeprazole (PRILOSEC) 40 MG capsule TAKE 1 CAPSULE BY MOUTH DAILY FOR HEARTBURN   No facility-administered encounter medications on file as of 10/01/2022.      Medical History: Past Medical History:  Diagnosis Date   Anemia    Anxiety    Arthritis    Cardiomegaly    Family history of breast cancer    Family history of ovarian cancer    5/22 cancer genetic testing letter sent   Hypertension    Hyperthyroidism    Pulmonary hypertension (HCC)    Sleep apnea    Thyroid disease    Vertigo      Vital Signs: BP (!) 160/98   Pulse 82   Temp 98.3 F (36.8 C)   Resp 16   Ht 5\' 6"  (1.676 m)   Wt 293 lb 12.8 oz (133.3 kg)   SpO2 92%   BMI 47.42 kg/m    Review of Systems  Constitutional:  Positive for diaphoresis. Negative for fatigue and fever.  HENT:  Negative for congestion, mouth sores and postnasal drip.   Respiratory:  Positive for chest tightness and shortness of breath. Negative for cough and wheezing.   Cardiovascular:  Positive for chest pain.  Genitourinary:  Negative for flank pain.  Musculoskeletal:  Positive for myalgias (right leg pain).  Psychiatric/Behavioral: Negative.      Physical Exam Vitals and nursing note reviewed.  Constitutional:      General: She is not in acute distress.    Appearance: Normal appearance. She is obese.  HENT:     Head: Normocephalic and atraumatic.  Eyes:     Pupils: Pupils are equal, round, and reactive to light.  Cardiovascular:     Rate and Rhythm: Normal rate and regular rhythm.  Pulmonary:     Effort: Pulmonary effort is normal. No respiratory distress.     Breath sounds: No wheezing or rhonchi.   Musculoskeletal:        General: Tenderness present. Normal range of motion.     Comments: Tenderness with light palpation of right calf and pt reported cold sensation  Skin:    General: Skin is warm and dry.  Neurological:     Mental Status: She is oriented to person, place, and time.  Psychiatric:        Mood and Affect: Mood normal.        Behavior: Behavior normal.       Assessment/Plan: 1. SOB (shortness of breath) Given progression of CP and SOB at rest without any acute anxiety or URI symptoms, will go ahead and send to ED for further evaluation. Cannot rule out blood clot at this time vs progression of known hiatal hernia - EKG 12-Lead  2. Chest pain at rest Will go to ED  3. Pain of right lower extremity Will go to ED   General Counseling: [name] verbalizes understanding of the findings of todays visit and agrees with plan of treatment. I have discussed any further diagnostic evaluation that may be needed or ordered today. We also reviewed her medications today. she has been encouraged to call the office with any questions or concerns that should arise related to todays visit.    Counseling:    Orders Placed This Encounter  Procedures   EKG 12-Lead    No orders of the defined types were placed in this encounter.   Time spent:40 Minutes

## 2022-10-01 NOTE — Progress Notes (Addendum)
Pt is requesting something to eat but pt is NPO at this time. NP Jon Billings made aware. Will; continue to monitor.  Update 1956: See new orders. Will continue to monitor.

## 2022-10-02 ENCOUNTER — Encounter: Admission: EM | Disposition: A | Discharge status: Home Health | Payer: Self-pay | Source: Home / Self Care

## 2022-10-02 ENCOUNTER — Inpatient Hospital Stay (HOSPITAL_COMMUNITY)
Admit: 2022-10-02 | Discharge: 2022-10-02 | Disposition: A | Discharge status: Home / Self Care | Payer: BLUE CROSS/BLUE SHIELD | Attending: Osteopathic Medicine | Admitting: Osteopathic Medicine

## 2022-10-02 ENCOUNTER — Inpatient Hospital Stay
Admit: 2022-10-02 | Discharge: 2022-10-02 | Disposition: A | Discharge status: Home / Self Care | Payer: BLUE CROSS/BLUE SHIELD | Attending: Osteopathic Medicine | Admitting: Osteopathic Medicine

## 2022-10-02 DIAGNOSIS — I2692 Saddle embolus of pulmonary artery without acute cor pulmonale: Secondary | ICD-10-CM | POA: Diagnosis not present

## 2022-10-02 DIAGNOSIS — I2609 Other pulmonary embolism with acute cor pulmonale: Secondary | ICD-10-CM | POA: Diagnosis not present

## 2022-10-02 DIAGNOSIS — I272 Pulmonary hypertension, unspecified: Secondary | ICD-10-CM

## 2022-10-02 DIAGNOSIS — I2699 Other pulmonary embolism without acute cor pulmonale: Secondary | ICD-10-CM

## 2022-10-02 HISTORY — PX: PULMONARY THROMBECTOMY: CATH118295

## 2022-10-02 HISTORY — PX: IVC FILTER INSERTION: CATH118245

## 2022-10-02 LAB — HEPARIN LEVEL (UNFRACTIONATED)
Heparin Unfractionated: 0.54 IU/mL (ref 0.30–0.70)
Heparin Unfractionated: 0.36 IU/mL (ref 0.30–0.70)

## 2022-10-02 SURGERY — PULMONARY THROMBECTOMY
Anesthesia: Moderate Sedation

## 2022-10-02 MED ORDER — DIPHENHYDRAMINE HCL 50 MG/ML IJ SOLN
INTRAMUSCULAR | Status: AC
  Filled 2022-10-02: qty 1

## 2022-10-02 MED ORDER — DIPHENHYDRAMINE HCL 50 MG/ML IJ SOLN
INTRAMUSCULAR | Status: DC | PRN
  Administered 2022-10-02: 50 mg via INTRAVENOUS

## 2022-10-02 MED ORDER — MIDAZOLAM HCL 5 MG/5ML IJ SOLN
INTRAMUSCULAR | Status: AC
  Filled 2022-10-02: qty 5

## 2022-10-02 MED ORDER — IODIXANOL 320 MG/ML IV SOLN
INTRAVENOUS | Status: DC | PRN
  Administered 2022-10-02: 60 mL

## 2022-10-02 MED ORDER — FENTANYL CITRATE (PF) 100 MCG/2ML IJ SOLN
INTRAMUSCULAR | Status: AC
  Filled 2022-10-02: qty 2

## 2022-10-02 MED ORDER — MIDAZOLAM HCL 2 MG/2ML IJ SOLN
INTRAMUSCULAR | Status: DC | PRN
  Administered 2022-10-02: 2 mg via INTRAVENOUS

## 2022-10-02 MED ORDER — HEPARIN (PORCINE) IN NACL 1000-0.9 UT/500ML-% IV SOLN
INTRAVENOUS | Status: DC | PRN
  Administered 2022-10-02: 1000 mL

## 2022-10-02 MED ORDER — LIDOCAINE-EPINEPHRINE (PF) 1 %-1:200000 IJ SOLN
INTRAMUSCULAR | Status: DC | PRN
  Administered 2022-10-02: 10 mL

## 2022-10-02 MED ORDER — ALTEPLASE 2 MG IJ SOLR
INTRAMUSCULAR | Status: AC
  Filled 2022-10-02: qty 8

## 2022-10-02 MED ORDER — ALTEPLASE 2 MG IJ SOLR
INTRAMUSCULAR | Status: DC | PRN
  Administered 2022-10-02: 8 mg

## 2022-10-02 MED ORDER — HEPARIN SODIUM (PORCINE) 1000 UNIT/ML IJ SOLN
INTRAMUSCULAR | Status: AC
  Filled 2022-10-02: qty 10

## 2022-10-02 MED ORDER — FENTANYL CITRATE (PF) 100 MCG/2ML IJ SOLN
INTRAMUSCULAR | Status: DC | PRN
  Administered 2022-10-02: 50 ug via INTRAVENOUS
  Administered 2022-10-02: 25 ug via INTRAVENOUS

## 2022-10-02 MED ORDER — ORAL CARE MOUTH RINSE: 15.0000 mL | OROMUCOSAL | Status: DC | PRN

## 2022-10-02 MED ORDER — CEFAZOLIN SODIUM-DEXTROSE 2-4 GM/100ML-% IV SOLN
INTRAVENOUS | Status: AC
  Filled 2022-10-02: qty 100

## 2022-10-02 MED ORDER — HEPARIN SODIUM (PORCINE) 1000 UNIT/ML IJ SOLN
INTRAMUSCULAR | Status: DC | PRN
  Administered 2022-10-02: 3000 [IU] via INTRAVENOUS

## 2022-10-02 SURGICAL SUPPLY — 19 items
CANISTER PENUMBRA ENGINE (MISCELLANEOUS) IMPLANT
CATH ANGIO 5F PIGTAIL 100CM (CATHETERS) IMPLANT
CATH INDIGO 12XTORQ 100 (CATHETERS) IMPLANT
CATH INDIGO SEP 12 (CATHETERS) IMPLANT
CATH SELECT BERN TIP 5F 130 (CATHETERS) IMPLANT
CLOSURE PERCLOSE PROSTYLE (VASCULAR PRODUCTS) IMPLANT
COVER EZ STRL 42X30 (DRAPES) IMPLANT
COVER PROBE ULTRASOUND 5X96 (MISCELLANEOUS) IMPLANT
GLIDEWIRE ADV .035X180CM (WIRE) IMPLANT
PACK ANGIOGRAPHY (CUSTOM PROCEDURE TRAY) ×1 IMPLANT
SHEATH 9FRX11 (SHEATH) IMPLANT
SHEATH BRITE TIP 6FRX11 (SHEATH) IMPLANT
SHEATH CHCK-FLO 14FR 13 (SHEATH) IMPLANT
SUT MNCRL AB 4-0 PS2 18 (SUTURE) IMPLANT
SUT PROLENE 0 CT 1 30 (SUTURE) IMPLANT
SYR MEDRAD MARK 7 150ML (SYRINGE) IMPLANT
TUBING CONTRAST HIGH PRESS 72 (TUBING) IMPLANT
WIRE GUIDERIGHT .035X150 (WIRE) IMPLANT
WIRE SUPRACORE 300CM (WIRE) IMPLANT

## 2022-10-02 NOTE — Consult Note (Signed)
ANTICOAGULATION CONSULT NOTE  Pharmacy Consult for Heparin Indication: chest pain/ACS/pulmonary embolism   Allergies  Allergen Reactions   Amlodipine     Patient Measurements: Height: 5\' 6"  (167.6 cm) Weight: 132.9 kg (293 lb) IBW/kg (Calculated) : 59.3 Heparin Dosing Weight: 91.8 kg  Vital Signs: Temp: 98.1 F (36.7 C) (07/26 2009) BP: 138/76 (07/26 2009) Pulse Rate: 66 (07/26 2009)  Labs: Recent Labs    10/01/22 1229 10/01/22 1421 10/01/22 1427 10/01/22 2257 10/02/22 0532 10/02/22 1351 10/02/22 2004  HGB 14.2  --   --   --  13.4  --   --   HCT 42.3  --   --   --  39.4  --   --   PLT 306  --   --   --  308  --   --   APTT  --  27  --   --   --   --   --   LABPROT  --  13.5  --   --   --   --   --   INR  --  1.0  --   --   --   --   --   HEPARINUNFRC  --   --   --    < > 0.49 0.54 0.36  CREATININE 0.77  --   --   --  0.77  --   --   TROPONINIHS 131*  --  135*  --   --   --   --    < > = values in this interval not displayed.    Estimated Creatinine Clearance: 102.1 mL/min (by C-G formula based on SCr of 0.77 mg/dL).   Medical History: Past Medical History:  Diagnosis Date   Anemia    Anxiety    Arthritis    Cardiomegaly    Family history of breast cancer    Family history of ovarian cancer    5/22 cancer genetic testing letter sent   Hypertension    Hyperthyroidism    Pulmonary hypertension (HCC)    Sleep apnea    Thyroid disease    Vertigo     Medications:  Medications Prior to Admission  Medication Sig Dispense Refill Last Dose   ALPRAZolam (XANAX) 0.25 MG tablet Take half tab twice a day as needed for severe panic attacks 30 tablet 0 10/01/2022   diltiazem (CARDIZEM CD) 180 MG 24 hr capsule TAKE 1 CAPSULE BY MOUTH DAILY 90 capsule 1 09/30/2022   escitalopram (LEXAPRO) 10 MG tablet TAKE 1 TABLET BY MOUTH ONCE DAILY WITH  SUPPER  FOR  PANIC  ATTACKS 90 tablet 2 09/30/2022   famotidine (PEPCID) 10 MG tablet Take 1 tablet (10 mg total) by mouth  daily. 90 tablet 1 09/30/2022   fluticasone (FLONASE) 50 MCG/ACT nasal spray Place 1 spray into both nostrils daily.   09/30/2022   loratadine (CLARITIN) 10 MG tablet Take 10 mg by mouth daily.   Past Week   meloxicam (MOBIC) 15 MG tablet Take 1 tablet (15 mg total) by mouth daily as needed. 90 tablet 1 09/30/2022   Multiple Vitamin (MULTIVITAMIN) tablet Take 1 tablet by mouth daily.   09/30/2022   omeprazole (PRILOSEC) 40 MG capsule TAKE 1 CAPSULE BY MOUTH DAILY FOR HEARTBURN 30 capsule 2 10/01/2022   diclofenac Sodium (VOLTAREN) 1 % GEL SMARTSIG:Gram(s) Topical 4 Times Daily PRN (Patient not taking: Reported on 10/01/2022)   Not Taking   doxycycline (VIBRA-TABS) 100 MG tablet Take 1 tablet (  100 mg total) by mouth 2 (two) times daily. (Patient not taking: Reported on 10/01/2022) 20 tablet 0 Completed Course   ergocalciferol (DRISDOL) 1.25 MG (50000 UT) capsule Take one cap q week (Patient not taking: Reported on 10/01/2022) 12 capsule 3 Completed Course   meclizine (ANTIVERT) 25 MG tablet Take 25 mg by mouth 3 (three) times daily as needed. (Patient not taking: Reported on 10/01/2022)   Completed Course   Scheduled:  Infusions:  PRN:  Anti-infectives (From admission, onward)    Start     Dose/Rate Route Frequency Ordered Stop   10/02/22 0600  ceFAZolin (ANCEF) IVPB 2g/100 mL premix        2 g 200 mL/hr over 30 Minutes Intravenous 30 min pre-op 10/01/22 1700 10/02/22 0347       Assessment: Pharmacy is consulted to start heparin for ACS. 62 y.o. female who presents to the ER for evaluation of 2 days of worsening shortness of breath intermittent chest pain and had an episode yesterday where she woke up from sleep and was diaphoretic with discomfort. Outpatient provider was concern for blood clot due to right calf pain. PMH: HTN, pulm HTN, and thrombocytosis. No DOAC PTA. Trop elevated at 135. D-dimer 13.21 Baseline labs ordered. CT chest in progress.   Goal of Therapy:  Heparin level 0.3-0.7  units/ml Monitor platelets by anticoagulation protocol: Yes   07/25 2257 HL 0.18, subtherapeutic 07/26 0532 HL 0.49, therapeutic x 1 0726 1351 HL 0.54, therapeutic 3 hr after vascula procedure  0726 2003 HL 0.36, therapeutic x 2  Plan: heparin level therapeutic Continue heparin at 1550 units/hr Transition to daily HL. Next HL tomorrow AM CBC daily while on heparin   Elliot Gurney, PharmD, BCPS Clinical Pharmacist  10/02/2022 8:47 PM

## 2022-10-02 NOTE — Consult Note (Addendum)
ANTICOAGULATION CONSULT NOTE  Pharmacy Consult for Heparin Indication: chest pain/ACS/pulmonary embolism   Allergies  Allergen Reactions   Amlodipine     Patient Measurements: Height: 5\' 6"  (167.6 cm) Weight: 132.9 kg (293 lb) IBW/kg (Calculated) : 59.3 Heparin Dosing Weight: 91.8 kg  Vital Signs: Temp: 98.2 F (36.8 C) (07/26 1206) Temp Source: Oral (07/26 0722) BP: 147/80 (07/26 1206) Pulse Rate: 64 (07/26 1206)  Labs: Recent Labs    10/01/22 1229 10/01/22 1421 10/01/22 1427 10/01/22 2257 10/02/22 0532  HGB 14.2  --   --   --  13.4  HCT 42.3  --   --   --  39.4  PLT 306  --   --   --  308  APTT  --  27  --   --   --   LABPROT  --  13.5  --   --   --   INR  --  1.0  --   --   --   HEPARINUNFRC  --   --   --  0.18* 0.49  CREATININE 0.77  --   --   --  0.77  TROPONINIHS 131*  --  135*  --   --     Estimated Creatinine Clearance: 102.1 mL/min (by C-G formula based on SCr of 0.77 mg/dL).   Medical History: Past Medical History:  Diagnosis Date   Anemia    Anxiety    Arthritis    Cardiomegaly    Family history of breast cancer    Family history of ovarian cancer    5/22 cancer genetic testing letter sent   Hypertension    Hyperthyroidism    Pulmonary hypertension (HCC)    Sleep apnea    Thyroid disease    Vertigo     Medications:  Medications Prior to Admission  Medication Sig Dispense Refill Last Dose   ALPRAZolam (XANAX) 0.25 MG tablet Take half tab twice a day as needed for severe panic attacks 30 tablet 0 10/01/2022   diltiazem (CARDIZEM CD) 180 MG 24 hr capsule TAKE 1 CAPSULE BY MOUTH DAILY 90 capsule 1 09/30/2022   escitalopram (LEXAPRO) 10 MG tablet TAKE 1 TABLET BY MOUTH ONCE DAILY WITH  SUPPER  FOR  PANIC  ATTACKS 90 tablet 2 09/30/2022   famotidine (PEPCID) 10 MG tablet Take 1 tablet (10 mg total) by mouth daily. 90 tablet 1 09/30/2022   fluticasone (FLONASE) 50 MCG/ACT nasal spray Place 1 spray into both nostrils daily.   09/30/2022    loratadine (CLARITIN) 10 MG tablet Take 10 mg by mouth daily.   Past Week   meloxicam (MOBIC) 15 MG tablet Take 1 tablet (15 mg total) by mouth daily as needed. 90 tablet 1 09/30/2022   Multiple Vitamin (MULTIVITAMIN) tablet Take 1 tablet by mouth daily.   09/30/2022   omeprazole (PRILOSEC) 40 MG capsule TAKE 1 CAPSULE BY MOUTH DAILY FOR HEARTBURN 30 capsule 2 10/01/2022   diclofenac Sodium (VOLTAREN) 1 % GEL SMARTSIG:Gram(s) Topical 4 Times Daily PRN (Patient not taking: Reported on 10/01/2022)   Not Taking   doxycycline (VIBRA-TABS) 100 MG tablet Take 1 tablet (100 mg total) by mouth 2 (two) times daily. (Patient not taking: Reported on 10/01/2022) 20 tablet 0 Completed Course   ergocalciferol (DRISDOL) 1.25 MG (50000 UT) capsule Take one cap q week (Patient not taking: Reported on 10/01/2022) 12 capsule 3 Completed Course   meclizine (ANTIVERT) 25 MG tablet Take 25 mg by mouth 3 (three) times daily as needed. (  Patient not taking: Reported on 10/01/2022)   Completed Course   Scheduled:  Infusions:  PRN:  Anti-infectives (From admission, onward)    Start     Dose/Rate Route Frequency Ordered Stop   10/02/22 0600  ceFAZolin (ANCEF) IVPB 2g/100 mL premix        2 g 200 mL/hr over 30 Minutes Intravenous 30 min pre-op 10/01/22 1700 10/02/22 6213       Assessment: Pharmacy is consulted to start heparin for ACS. 62 y.o. female who presents to the ER for evaluation of 2 days of worsening shortness of breath intermittent chest pain and had an episode yesterday where she woke up from sleep and was diaphoretic with discomfort. Outpatient provider was concern for blood clot due to right calf pain. PMH: HTN, pulm HTN, and thrombocytosis. No DOAC PTA. Trop elevated at 135. D-dimer 13.21 Baseline labs ordered. CT chest in progress.   Goal of Therapy:  Heparin level 0.3-0.7 units/ml Monitor platelets by anticoagulation protocol: Yes   07/25 2257 HL 0.18, subtherapeutic 07/26 0532 HL 0.49, therapeutic x  1 0726 1351 HL 0.54, therapeutic 3 hr after vascula procedure   Plan:  Thrombectomy done 7/26 @ 0951, heparin infusion resumed at  1550 un/hr after procedure. Recheck HL in  6 hrs to confirm HL remains therapeutic CBC daily while on heparin  Mckinzey Entwistle Rodriguez-Guzman PharmD, BCPS 10/02/2022 1:42 PM

## 2022-10-02 NOTE — Progress Notes (Signed)
PROGRESS NOTE    Shelley [name]   ZOX:[id number] DOB: January 04, 1961  DOA: 10/01/2022 Date of Service: 10/02/22 PCP: Carlean Jews, PA-C     Brief Narrative / Hospital Course:  Shelley [name] is a 62 y.o. female w/ PMH HTN, pulmonary hypertension, Anxiety/Depression, GERD, seasonal allergies, arthritis. She presents from home/doctor's office to the ED on 10/01/22  for evaluation of 2 days of worsening shortness of breath intermittent chest pain and had an episode yesterday where she woke up from sleep and was diaphoretic with discomfort.  Has been having some swelling in her lower leg as well.  Does wear CPAP at home.  Has not had any cough or major congestion.  No fevers. Reports R leg pain few weeks, has been following w/ ortho and less mobile than usual.  07/25: admitted for PE. Tx heparin gtt, vascular consult  07/26: thrombectomy this morning. Echo no concerns. [place] negative for DVT LE. Remains on heparin gtt. PT/OT to assess.     Consultants:  Vascular surgery   Procedures: 10/02/22 thrombectomy w/ Dr Wyn Quaker  "Thrombolysis with 8 mg of tPA, 4 mg in each of the main pulmonary arteries. Mechanical thrombectomy using the penumbra CAT 12 catheter to the right upper lobe, right middle lobe, and right lower lobe pulmonary arteries as well as the left lower lobe and left upper lobe pulmonary arteries      ASSESSMENT & PLAN:   Principal Problem:   Acute saddle pulmonary embolism (HCC) Active Problems:   Hypertension   Acquired hypothyroidism   Seasonal allergies   Cardiomegaly   Pulmonary hypertension, primary (HCC)   Shortness of breath   OSA (obstructive sleep apnea)   Gastroesophageal reflux disease without esophagitis   Anxiety and depression   Pulmonary embolism (HCC)  Acute saddle pulmonary embolism (HCC) with R heart strain  Shortness of breath without respiratory failure  Heparin gtt Vascular consulted S/p thrombectomy 10/02/22  Echocardiogram no  concerns   Cardiomegaly Pulmonary hypertension, primary (HCC) Right atrium and right ventricle and the pulmonary outflow tract appears quite dilated when evaluated in thrombectomy procedure Echo no concerns     Hypertension BP slightly above normal/goal Continue home diltiazem    Leukocytosis Question reactive, no infectious source noted  Monitor CBC   Acquired hypothyroidism On problem list, no meds listed TSH   Seasonal allergies Continue home loratadine   OSA (obstructive sleep apnea) CPAP at bedtime    Gastroesophageal reflux disease without esophagitis Continue home famotidine, omeprazole or formulary PPI   Anxiety and depression Continue home escitalopram and alprazolam    Osteoarthritis Hold po NSAID for now but topical NSAID ok Pain management w/ PRN acetaminophen mild/moderate pain, oxycodone severe pain        DVT prophylaxis: heparin Pertinent IV fluids/nutrition: restart diet following thrombectomy  Central lines / invasive devices: none  Code Status: FULL CODE see H&P ACP documentation reviewed: 10/02/22 none on file   Current Admission Status: inpatient   TOC needs / Dispo plan: likely d/c home, no TOC needs at this time  Barriers to discharge / significant pending items: heparin gtt, PT/OT eval, if ambulating well and vascular team ok to transition to oral AC may be able to go tomorrow              Subjective / Brief ROS:  Patient reports doing well this morning no concerns Denies CP Still some SOB Pain controlled, RLE still sore .  Denies new weakness.  Tolerating diet .  Reports  no concerns w/ urination/defecation.   Family Communication: family at bedside on rounds     Objective Findings:  Vitals:   10/02/22 0912 10/02/22 0915 10/02/22 0930 10/02/22 1206  BP: 138/71 (!) 141/75 133/75 (!) 147/80  Pulse: 71 68 66 64  Resp: 20 20 19 18   Temp:    98.2 F (36.8 C)  TempSrc:      SpO2: 95% 95% 97% 95%  Weight:       Height:        Intake/Output Summary (Last 24 hours) at 10/02/2022 1545 Last data filed at 10/02/2022 1524 Gross per 24 hour  Intake 1159.16 ml  Output 375 ml  Net 784.16 ml   Filed Weights   10/01/22 1225 10/01/22 2100 10/02/22 0722  Weight: 132.9 kg (!) 137 kg 132.9 kg    Examination:  Physical Exam Constitutional:      General: She is not in acute distress.    Appearance: She is obese. She is not ill-appearing.  Cardiovascular:     Rate and Rhythm: Normal rate and regular rhythm.  Pulmonary:     Effort: Pulmonary effort is normal.     Breath sounds: Normal breath sounds.  Musculoskeletal:     Right lower leg: No edema.     Left lower leg: No edema.  Neurological:     General: No focal deficit present.     Mental Status: She is alert and oriented to person, place, and time.  Psychiatric:        Mood and Affect: Mood normal.        Behavior: Behavior normal.          Scheduled Medications:   diltiazem  180 mg Oral Daily   escitalopram  10 mg Oral QHS   famotidine  10 mg Oral Daily   loratadine  10 mg Oral Daily   pantoprazole  40 mg Oral Daily    Continuous Infusions:  heparin 1,550 Units/hr (10/02/22 1524)    PRN Medications:  acetaminophen **OR** acetaminophen, ALPRAZolam, bisacodyl, diclofenac Sodium, hydrALAZINE, ondansetron **OR** ondansetron (ZOFRAN) IV, mouth rinse, oxyCODONE, polyethylene glycol  Antimicrobials from admission:  Anti-infectives (From admission, onward)    Start     Dose/Rate Route Frequency Ordered Stop   10/02/22 0600  ceFAZolin (ANCEF) IVPB 2g/100 mL premix        2 g 200 mL/hr over 30 Minutes Intravenous 30 min pre-op 10/01/22 1700 10/02/22 2025           Data Reviewed:  I have personally reviewed the following...  CBC: Recent Labs  Lab 10/01/22 1229 10/02/22 0532  WBC 14.3* 12.8*  HGB 14.2 13.4  HCT 42.3 39.4  MCV 86.7 85.1  PLT 306 308   Basic Metabolic Panel: Recent Labs  Lab 10/01/22 1229  10/02/22 0532  NA 141 137  K 3.9 3.9  CL 113* 109  CO2 27 23  GLUCOSE 89 104*  BUN 17 22  CREATININE 0.77 0.77  CALCIUM 8.8* 8.6*   GFR: Estimated Creatinine Clearance: 102.1 mL/min (by C-G formula based on SCr of 0.77 mg/dL). Liver Function Tests: No results for input(s): "AST", "ALT", "ALKPHOS", "BILITOT", "PROT", "ALBUMIN" in the last 168 hours. No results for input(s): "LIPASE", "AMYLASE" in the last 168 hours. No results for input(s): "AMMONIA" in the last 168 hours. Coagulation Profile: Recent Labs  Lab 10/01/22 1421  INR 1.0   Cardiac Enzymes: No results for input(s): "CKTOTAL", "CKMB", "CKMBINDEX", "TROPONINI" in the last 168 hours. BNP (last 3 results) No  results for input(s): "PROBNP" in the last 8760 hours. HbA1C: No results for input(s): "HGBA1C" in the last 72 hours. CBG: No results for input(s): "GLUCAP" in the last 168 hours. Lipid Profile: No results for input(s): "CHOL", "HDL", "LDLCALC", "TRIG", "CHOLHDL", "LDLDIRECT" in the last 72 hours. Thyroid Function Tests: Recent Labs    10/02/22 0532  TSH 2.455   Anemia Panel: No results for input(s): "VITAMINB12", "FOLATE", "FERRITIN", "TIBC", "IRON", "RETICCTPCT" in the last 72 hours. Most Recent Urinalysis On File:     Component Value Date/Time   COLORURINE YELLOW (A) 12/31/2014 2340   APPEARANCEUR Clear 07/27/2022 1558   LABSPEC 1.011 12/31/2014 2340   PHURINE 5.0 12/31/2014 2340   GLUCOSEU Negative 07/27/2022 1558   HGBUR NEGATIVE 12/31/2014 2340   BILIRUBINUR Negative 07/27/2022 1558   KETONESUR NEGATIVE 12/31/2014 2340   PROTEINUR Negative 07/27/2022 1558   PROTEINUR NEGATIVE 12/31/2014 2340   UROBILINOGEN 0.2 07/31/2007 1750   NITRITE Negative 07/27/2022 1558   NITRITE NEGATIVE 12/31/2014 2340   LEUKOCYTESUR 1+ (A) 07/27/2022 1558   Sepsis Labs: @LABRCNTIP (procalcitonin:4,lacticidven:4) Microbiology: No results found for this or any previous visit (from the past 240 hour(s)).     Radiology Studies last 3 days: ECHOCARDIOGRAM COMPLETE  Result Date: 10/02/2022    ECHOCARDIOGRAM REPORT   Patient Name:   Shelley [name] Date of Exam: 10/02/2022 Medical Rec #:  [id number]          Height:       66.0 in Accession #:    [id number]         Weight:       293.0 lb Date of Birth:  May 20, 1960           BSA:          2.352 m Patient Age:    62 years           BP:           150/78 mmHg Patient Gender: F                  HR:           60 bpm. Exam Location:  [facility] Procedure: 2D Echo, Cardiac Doppler and Color Doppler Indications:     Pulmonary embolus I26.09                  Pulmonary hypertension I27.2  History:         Patient has prior history of Echocardiogram examinations, most                  recent 12/18/2019. Pulmonary HTN; Risk Factors:Hypertension.  Sonographer:     Cristela Blue Referring Phys:  [id number] Sunnie Nielsen Diagnosing Phys: Julien Nordmann MD  Sonographer Comments: Technically difficult study due to poor echo windows, no apical window and no subcostal window. Pt was in room recovering after vascular procedure on her leg---she was kept supine for echo. IMPRESSIONS  1. Left ventricular ejection fraction, by estimation, is 60 to 65%. The left ventricle has normal function. The left ventricle has no regional wall motion abnormalities. Left ventricular diastolic parameters are indeterminate.  2. Right ventricular systolic function is normal. The right ventricular size is normal. Tricuspid regurgitation signal is inadequate for assessing PA pressure.  3. The mitral valve is normal in structure. No evidence of mitral valve regurgitation. No evidence of mitral stenosis.  4. The aortic valve has an indeterminant number of cusps. Aortic valve regurgitation is not visualized. No aortic stenosis is present.  5. The inferior vena cava is normal in size with greater than 50% respiratory variability, suggesting right atrial pressure of 3 mmHg. FINDINGS  Left Ventricle: Left ventricular  ejection fraction, by estimation, is 60 to 65%. The left ventricle has normal function. The left ventricle has no regional wall motion abnormalities. The left ventricular internal cavity size was normal in size. There is  no left ventricular hypertrophy. Left ventricular diastolic parameters are indeterminate. Right Ventricle: The right ventricular size is normal. No increase in right ventricular wall thickness. Right ventricular systolic function is normal. Tricuspid regurgitation signal is inadequate for assessing PA pressure. Left Atrium: Left atrial size was normal in size. Right Atrium: Right atrial size was normal in size. Pericardium: There is no evidence of pericardial effusion. Mitral Valve: The mitral valve is normal in structure. No evidence of mitral valve regurgitation. No evidence of mitral valve stenosis. Tricuspid Valve: The tricuspid valve is normal in structure. Tricuspid valve regurgitation is not demonstrated. No evidence of tricuspid stenosis. Aortic Valve: The aortic valve has an indeterminant number of cusps. Aortic valve regurgitation is not visualized. No aortic stenosis is present. Pulmonic Valve: The pulmonic valve was normal in structure. Pulmonic valve regurgitation is not visualized. No evidence of pulmonic stenosis. Aorta: The aortic root is normal in size and structure. Venous: The inferior vena cava is normal in size with greater than 50% respiratory variability, suggesting right atrial pressure of 3 mmHg. IAS/Shunts: No atrial level shunt detected by color flow Doppler.  LEFT VENTRICLE PLAX 2D LVIDd:         4.30 cm LVIDs:         3.10 cm LV PW:         1.20 cm LV IVS:        1.10 cm LVOT diam:     2.00 cm LVOT Area:     3.14 cm  LEFT ATRIUM         Index LA diam:    2.30 cm 0.98 cm/m   AORTA Ao Root diam: 3.10 cm  SHUNTS Systemic Diam: 2.00 cm Julien Nordmann MD Electronically signed by Julien Nordmann MD Signature Date/Time: 10/02/2022/12:16:01 PM    Final    PERIPHERAL VASCULAR  CATHETERIZATION  Result Date: 10/02/2022 See surgical note for result.  US Venous Img Lower Bilateral  Result Date: 10/01/2022 CLINICAL DATA:  Shortness of breath with elevated D-dimer. Saddle PE. EXAM: BILATERAL LOWER EXTREMITY VENOUS DOPPLER ULTRASOUND TECHNIQUE: Gray-scale sonography with graded compression, as well as color Doppler and duplex ultrasound were performed to evaluate the lower extremity deep venous systems from the level of the common femoral vein and including the common femoral, femoral, profunda femoral, popliteal and calf veins including the posterior tibial, peroneal and gastrocnemius veins when visible. The superficial great saphenous vein was also interrogated. Spectral Doppler was utilized to evaluate flow at rest and with distal augmentation maneuvers in the common femoral, femoral and popliteal veins. COMPARISON:  Left lower extremity venous Doppler ultrasound 07/18/2013. FINDINGS: RIGHT LOWER EXTREMITY Common Femoral Vein: No evidence of thrombus. Normal compressibility, respiratory phasicity and response to augmentation. Saphenofemoral Junction: No evidence of thrombus. Normal compressibility and flow on color Doppler imaging. Profunda Femoral Vein: No evidence of thrombus. Normal compressibility and flow on color Doppler imaging. Femoral Vein: No evidence of thrombus. Normal compressibility, respiratory phasicity and response to augmentation. Popliteal Vein: No evidence of thrombus. Normal compressibility, respiratory phasicity and response to augmentation. Calf Veins: No evidence of thrombus. Normal compressibility and flow on color Doppler  imaging. Superficial Great Saphenous Vein: No evidence of thrombus. Normal compressibility. Venous Reflux:  None. Other Findings:  None. LEFT LOWER EXTREMITY Common Femoral Vein: No evidence of thrombus. Normal compressibility, respiratory phasicity and response to augmentation. Saphenofemoral Junction: No evidence of thrombus. Normal  compressibility and flow on color Doppler imaging. Profunda Femoral Vein: No evidence of thrombus. Normal compressibility and flow on color Doppler imaging. Femoral Vein: No evidence of thrombus. Normal compressibility, respiratory phasicity and response to augmentation. Popliteal Vein: No evidence of thrombus. Normal compressibility, respiratory phasicity and response to augmentation. Calf Veins: No evidence of thrombus. Normal compressibility and flow on color Doppler imaging. Superficial Great Saphenous Vein: No evidence of thrombus. Normal compressibility. Venous Reflux:  None. Other Findings:  None. IMPRESSION: No evidence of deep venous thrombosis in either lower extremity. Electronically Signed   By: Darliss Cheney M.D.   On: 10/01/2022 18:00   CT Angio Chest Pulmonary Embolism (PE) W or WO Contrast  Result Date: 10/01/2022 CLINICAL DATA:  Pulmonary embolus suspected EXAM: CT ANGIOGRAPHY CHEST WITH CONTRAST TECHNIQUE: Multidetector CT imaging of the chest was performed using the standard protocol during bolus administration of intravenous contrast. Multiplanar CT image reconstructions and MIPs were obtained to evaluate the vascular anatomy. RADIATION DOSE REDUCTION: This exam was performed according to the departmental dose-optimization program which includes automated exposure control, adjustment of the mA and/or kV according to patient size and/or use of iterative reconstruction technique. CONTRAST:  75mL OMNIPAQUE IOHEXOL 350 MG/ML SOLN COMPARISON:  Chest CT dated May 24, 2020 FINDINGS: Cardiovascular: Nonocclusive saddle pulmonary embolus. Additional bilateral pulmonary emboli seen throughout the lungs in the segmental and subsegmental pulmonary arteries. Mildly elevated RV to LV ratio of1.1. Normal caliber thoracic aorta with no significant atherosclerotic disease. No visible coronary artery calcifications. Mediastinum/Nodes: Moderate hiatal hernia. Thyroid is unremarkable. No enlarged lymph nodes  seen in the chest. Lungs/Pleura: Central airways are patent. Mild atelectasis. No consolidation, pleural effusion or pneumothorax. Upper Abdomen: Atrophic spleen with nodular contour, unchanged when compared with the prior exam. No acute abnormality. Musculoskeletal: No chest wall abnormality. No acute or significant osseous findings. Review of the MIP images confirms the above findings. IMPRESSION: 1. Nonocclusive saddle pulmonary embolus. Additional bilateral pulmonary emboli seen throughout the lungs in the segmental and subsegmental pulmonary arteries. 2. Mildly elevated RV to LV ratio of 1.1, suggestive of right heart strain. Critical Value/emergent results were called by telephone at the time of interpretation on 10/01/2022 at 3:47 pm to provider Dr. Minna Antis, who verbally acknowledged these results. Electronically Signed   By: Allegra Lai M.D.   On: 10/01/2022 15:49   DG Chest 2 View  Result Date: 10/01/2022 CLINICAL DATA:  Shortness of breath. EXAM: CHEST - 2 VIEW COMPARISON:  11/27/2019. FINDINGS: Low lung volumes accentuate the pulmonary vasculature and cardiomediastinal silhouette. No consolidation or pulmonary edema. No pleural effusion or pneumothorax. IMPRESSION: Low lung volumes without evidence of acute cardiopulmonary disease. Electronically Signed   By: Orvan Falconer M.D.   On: 10/01/2022 14:03             LOS: 1 day      Sunnie Nielsen, DO [facility] 10/02/2022, 3:45 PM    Dictation software may have been used to generate the above note. Typos may occur and escape review in typed/dictated notes. Please contact Dr Lyn Hollingshead directly for clarity if needed.  Staff may message me via secure chat in Epic  but this may not receive an immediate response,  please page me for urgent  matters!  If 7PM-7AM, please contact night coverage [email or web address]

## 2022-10-02 NOTE — Consult Note (Signed)
ANTICOAGULATION CONSULT NOTE  Pharmacy Consult for Heparin Indication: chest pain/ACS/pulmonary embolism   Allergies  Allergen Reactions   Amlodipine     Patient Measurements: Height: 5\' 6"  (167.6 cm) Weight: (!) 137 kg (302 lb 0.5 oz) IBW/kg (Calculated) : 59.3 Heparin Dosing Weight: 91.8 kg  Vital Signs: Temp: 98.2 F (36.8 C) (07/26 0539) BP: 150/78 (07/26 0539) Pulse Rate: 60 (07/26 0539)  Labs: Recent Labs    10/01/22 1229 10/01/22 1421 10/01/22 1427 10/01/22 2257 10/02/22 0532  HGB 14.2  --   --   --  13.4  HCT 42.3  --   --   --  39.4  PLT 306  --   --   --  308  APTT  --  27  --   --   --   LABPROT  --  13.5  --   --   --   INR  --  1.0  --   --   --   HEPARINUNFRC  --   --   --  0.18* 0.49  CREATININE 0.77  --   --   --   --   TROPONINIHS 131*  --  135*  --   --     Estimated Creatinine Clearance: 104.1 mL/min (by C-G formula based on SCr of 0.77 mg/dL).   Medical History: Past Medical History:  Diagnosis Date   Anemia    Anxiety    Arthritis    Cardiomegaly    Family history of breast cancer    Family history of ovarian cancer    5/22 cancer genetic testing letter sent   Hypertension    Hyperthyroidism    Pulmonary hypertension (HCC)    Sleep apnea    Thyroid disease    Vertigo     Medications:  Medications Prior to Admission  Medication Sig Dispense Refill Last Dose   ALPRAZolam (XANAX) 0.25 MG tablet Take half tab twice a day as needed for severe panic attacks 30 tablet 0 10/01/2022   diltiazem (CARDIZEM CD) 180 MG 24 hr capsule TAKE 1 CAPSULE BY MOUTH DAILY 90 capsule 1 09/30/2022   escitalopram (LEXAPRO) 10 MG tablet TAKE 1 TABLET BY MOUTH ONCE DAILY WITH  SUPPER  FOR  PANIC  ATTACKS 90 tablet 2 09/30/2022   famotidine (PEPCID) 10 MG tablet Take 1 tablet (10 mg total) by mouth daily. 90 tablet 1 09/30/2022   fluticasone (FLONASE) 50 MCG/ACT nasal spray Place 1 spray into both nostrils daily.   09/30/2022   loratadine (CLARITIN) 10 MG  tablet Take 10 mg by mouth daily.   Past Week   meloxicam (MOBIC) 15 MG tablet Take 1 tablet (15 mg total) by mouth daily as needed. 90 tablet 1 09/30/2022   Multiple Vitamin (MULTIVITAMIN) tablet Take 1 tablet by mouth daily.   09/30/2022   omeprazole (PRILOSEC) 40 MG capsule TAKE 1 CAPSULE BY MOUTH DAILY FOR HEARTBURN 30 capsule 2 10/01/2022   diclofenac Sodium (VOLTAREN) 1 % GEL SMARTSIG:Gram(s) Topical 4 Times Daily PRN (Patient not taking: Reported on 10/01/2022)   Not Taking   doxycycline (VIBRA-TABS) 100 MG tablet Take 1 tablet (100 mg total) by mouth 2 (two) times daily. (Patient not taking: Reported on 10/01/2022) 20 tablet 0 Completed Course   ergocalciferol (DRISDOL) 1.25 MG (50000 UT) capsule Take one cap q week (Patient not taking: Reported on 10/01/2022) 12 capsule 3 Completed Course   meclizine (ANTIVERT) 25 MG tablet Take 25 mg by mouth 3 (three) times daily as needed. (  Patient not taking: Reported on 10/01/2022)   Completed Course   Scheduled:  Infusions:  PRN:  Anti-infectives (From admission, onward)    Start     Dose/Rate Route Frequency Ordered Stop   10/02/22 0600  ceFAZolin (ANCEF) IVPB 2g/100 mL premix        2 g 200 mL/hr over 30 Minutes Intravenous 30 min pre-op 10/01/22 1700         Assessment: Pharmacy is consulted to start heparin for ACS. 62 y.o. female who presents to the ER for evaluation of 2 days of worsening shortness of breath intermittent chest pain and had an episode yesterday where she woke up from sleep and was diaphoretic with discomfort. Outpatient provider was concern for blood clot due to right calf pain. PMH: HTN, pulm HTN, and thrombocytosis. No DOAC PTA. Trop elevated at 135. D-dimer 13.21 Baseline labs ordered. CT chest in progress.   Goal of Therapy:  Heparin level 0.3-0.7 units/ml Monitor platelets by anticoagulation protocol: Yes   07/25 2257 HL 0.18, subtherapeutic 07/26 0532 HL 0.49, therapeutic x 1  Plan:  Continue heparin infusion at  1550 Recheck HL in 6 hrs to confirm, then daily CBC daily while on heparin  Otelia Sergeant, PharmD, [facility] 10/02/2022 6:09 AM

## 2022-10-02 NOTE — Progress Notes (Signed)
*  PRELIMINARY RESULTS* Echocardiogram 2D Echocardiogram has been performed.  Shelley [name] 10/02/2022, 11:16 AM

## 2022-10-02 NOTE — Op Note (Signed)
[facility]  Percutaneous Study/Intervention Procedural Note   Date of Surgery: 10/02/2022,9:51 AM  Surgeon: Festus Barren  Pre-operative Diagnosis: Symptomatic bilateral pulmonary emboli  Post-operative diagnosis:  Same  Procedure(s) Performed:  1.  Contrast injection right heart  2.  Thrombolysis with 8 mg of tPA, 4 mg in each of the main pulmonary arteries  3.  Mechanical thrombectomy using the penumbra CAT 12 catheter to the right upper lobe, right middle lobe, and right lower lobe pulmonary arteries as well as the left lower lobe and left upper lobe pulmonary arteries  4.  Selective catheter placement right lower lobe, middle lobe, and upper lobe pulmonary arteries  5.  Selective catheter placement left upper lobe and lower lobe pulmonary arteries    Anesthesia: Conscious sedation was administered under my direct supervision by the interventional radiology RN. IV Versed plus fentanyl were utilized. Continuous ECG, pulse oximetry and blood pressure was monitored throughout the entire procedure.  Versed and fentanyl were administered intravenously.  Conscious sedation was administered for a total of 42 minutes using 2 mg of Versed and 75 mcg of Fentanyl.  EBL: 375 cc  Sheath: 14 French right femoral vein  Contrast: 60 cc   Fluoroscopy Time: 9.6 minutes  Indications:  Patient presents with pulmonary emboli. The patient is symptomatic with hypoxemia and dyspnea on exertion.  There is evidence of right heart strain on the CT angiogram. The patient is otherwise a good candidate for intervention and even the long-term benefits pulmonary angiography with thrombolysis is offered. The risks and benefits are reviewed long-term benefits are discussed. All questions are answered patient agrees to proceed.  Procedure:  Shelley Cata Simpsonis a 62 y.o. female who was identified and appropriate procedural time out was performed.  The patient was then placed supine on the table  and prepped and draped in the usual sterile fashion.  Ultrasound was used to evaluate the right common femoral vein.  It was patent, as it was echolucent and compressible.  A digital ultrasound image was acquired for the permanent record.  A Seldinger needle was used to access the right common femoral vein under direct ultrasound guidance.  A 0.035 J wire was advanced without resistance and a 5Fr sheath was placed and then I placed a ProGlide device in a preclose fashion.  Then upsized to an 14 [place] sheath.    The wire and pigtail catheter were then negotiated into the right atrium and bolus injection of contrast was utilized to demonstrate the right ventricle and the pulmonary artery outflow. The advantage wire and catheter were then negotiated into the left pulmonary artery and then into the left lower lobe and left upper lobe pulmonary arteries where hand injection of contrast was utilized to demonstrate the pulmonary arteries and confirm the locations of the pulmonary emboli.  Significant thrombus burden was seen in both the left upper lobe and lower lobe pulmonary arteries.  I then used the advantage wire and pigtail catheter transition to the right side.  I cannulated the right upper lobe, middle lobe, and lower lobe pulmonary arteries and selective imaging showed significant thrombus burden throughout all 3 lobes on the right side.  3000 Units of heparin was then given and allowed to circulate.  TPA was reconstituted and delivered onto the table. A total of 8 milligrams of TPA was utilized.  4 mg was administered on the left side and 4 mg was administered on the right side. This was then allowed to dwell.  The  Penumbra Cat 12 catheter was then advanced up into the pulmonary vasculature. The right lung was addressed first. Catheter was negotiated into the right upper lobe and mechanical thrombectomy was performed with the help of the separator. Follow-up imaging demonstrated a good result and  therefore the catheter was renegotiated into the right middle lobe pulmonary artery and again mechanical thrombectomy was performed. Passes were made with both the Penumbra catheter itself as well as introducing the separator.  Finally, navigated into the right lower lobe pulmonary artery where mechanical thrombectomy was performed with the penumbra CAT 12 catheter and the separator.  Follow-up imaging was then performed.  Significant improvement was seen on the right side with residual thrombus seen mostly in the right middle lobe pulmonary artery but a marked improvement was present.  The Penumbra Cat 12 catheter was then negotiated to the opposite side. The left lung was then addressed. Catheter was negotiated into the left lower lobe pulmonary artery and mechanical thrombectomy was performed with the help of the separator. Follow-up imaging demonstrated a good result and therefore the catheter was renegotiated into the left upper lobe pulmonary artery and again mechanical thrombectomy was performed. Passes were made with both the Penumbra catheter itself as well as introducing the separator. Follow-up imaging was then performed.  Significant improvement was seen with small amount of thrombus more in the left upper lobe than the left lower lobe.  After review these images wires were reintroduced and the catheters removed. Then, the sheath is then pulled, the Pro-glide devices placed, and pressure is held. A 4-0 Monocryl suture was placed to close the skin and a sterile dressing was placed.    Findings:   Right heart imaging:  Right atrium and right ventricle and the pulmonary outflow tract appears quite dilated  Right lung: Significant thrombus burden was seen in the right upper lobe, middle lobe, and lower lobe pulmonary arteries  Left lung:  Significant thrombus burden was seen in both the left upper lobe and lower lobe pulmonary arteries.    Disposition: Patient was taken to the recovery room in  stable condition having tolerated the procedure well.  Shelley [name] 10/02/2022,9:51 AM

## 2022-10-02 NOTE — Hospital Course (Addendum)
Shelley [name] is a 62 y.o. female w/ PMH HTN, pulmonary hypertension, Anxiety/Depression, GERD, seasonal allergies, arthritis. She presents from home/doctor's office to the ED on 10/01/22  for evaluation of 2 days of worsening shortness of breath intermittent chest pain and had an episode yesterday where she woke up from sleep and was diaphoretic with discomfort.  Has been having some swelling in her lower leg as well.  Does wear CPAP at home.  Has not had any cough or major congestion.  No fevers. Reports R leg pain few weeks, has been following w/ ortho and less mobile than usual.  07/25: admitted for PE. Tx heparin gtt, vascular consult  07/26: thrombectomy this morning. Echo no concerns. [place] negative for DVT LE. Remains on heparin gtt. PT/OT to assess.  07/27: d/c heparin this afternoon and start Eliquis. PT recs for HH, TOC to work on this, should have everything in place by tomorrow and plan for discharge tomorrow relatively early AM    Consultants:  Vascular surgery   Procedures: 10/02/22 thrombectomy w/ Dr Wyn Quaker  "Thrombolysis with 8 mg of tPA, 4 mg in each of the main pulmonary arteries. Mechanical thrombectomy using the penumbra CAT 12 catheter to the right upper lobe, right middle lobe, and right lower lobe pulmonary arteries as well as the left lower lobe and left upper lobe pulmonary arteries      ASSESSMENT & PLAN:   Principal Problem:   Acute saddle pulmonary embolism (HCC) Active Problems:   Hypertension   Acquired hypothyroidism   Seasonal allergies   Cardiomegaly   Pulmonary hypertension, primary (HCC)   Shortness of breath   OSA (obstructive sleep apnea)   Gastroesophageal reflux disease without esophagitis   Anxiety and depression   Pulmonary embolism (HCC)  Acute saddle pulmonary embolism (HCC) with R heart strain  Shortness of breath without respiratory failure  Heparin gtt --> DOAC w/ Eliquis today  Vascular follow outpatient  S/p thrombectomy 10/02/22   Echocardiogram no concerns   Cardiomegaly Pulmonary hypertension, primary (HCC) Right atrium and right ventricle and the pulmonary outflow tract appears quite dilated when evaluated in thrombectomy procedure Echo no concerns   Hypertension BP slightly above normal/goal Continue home diltiazem    Leukocytosis Question reactive, no infectious source noted  Monitor CBC   Acquired hypothyroidism On problem list, no meds listed TSH   Seasonal allergies Continue home loratadine   OSA (obstructive sleep apnea) CPAP at bedtime    Gastroesophageal reflux disease without esophagitis Continue home famotidine, omeprazole or formulary PPI   Anxiety and depression Continue home escitalopram and alprazolam    Osteoarthritis Hold po NSAID for now but topical NSAID ok Pain management w/ PRN acetaminophen mild/moderate pain, oxycodone severe pain        DVT prophylaxis: heparin Pertinent IV fluids/nutrition: restart diet following thrombectomy  Central lines / invasive devices: none  Code Status: FULL CODE see H&P ACP documentation reviewed: 10/02/22 none on file   Current Admission Status: inpatient   TOC needs / Dispo plan: HH/DME Barriers to discharge / significant pending items: TOC to arrange HH/DME for tomorrow

## 2022-10-02 NOTE — Plan of Care (Signed)

## 2022-10-02 NOTE — Plan of Care (Signed)
  Problem: Education: Goal: Knowledge of General Education information will improve Description: Including pain rating scale, medication(s)/side effects and non-pharmacologic comfort measures Outcome: Progressing   Problem: Health Behavior/Discharge Planning: Goal: Ability to manage health-related needs will improve Outcome: Progressing   Problem: Clinical Measurements: Goal: Diagnostic test results will improve Outcome: Progressing   Problem: Clinical Measurements: Goal: Respiratory complications will improve Outcome: Progressing   Problem: Clinical Measurements: Goal: Cardiovascular complication will be avoided Outcome: Progressing   Problem: Activity: Goal: Risk for activity intolerance will decrease Outcome: Progressing   Problem: Nutrition: Goal: Adequate nutrition will be maintained Outcome: Progressing   Problem: Coping: Goal: Level of anxiety will decrease Outcome: Progressing   Problem: Elimination: Goal: Will not experience complications related to bowel motility Outcome: Progressing   Problem: Skin Integrity: Goal: Risk for impaired skin integrity will decrease Outcome: Progressing

## 2022-10-03 DIAGNOSIS — I2692 Saddle embolus of pulmonary artery without acute cor pulmonale: Secondary | ICD-10-CM | POA: Diagnosis not present

## 2022-10-03 MED ORDER — APIXABAN 5 MG PO TABS
10.0000 mg | ORAL_TABLET | Freq: Two times a day (BID) | ORAL | Status: DC
  Administered 2022-10-03 – 2022-10-04 (×2): 10 mg via ORAL
  Filled 2022-10-03 (×2): qty 2

## 2022-10-03 MED ORDER — APIXABAN 5 MG PO TABS: 5.0000 mg | ORAL_TABLET | Freq: Two times a day (BID) | ORAL | Status: DC

## 2022-10-03 NOTE — Progress Notes (Signed)
[facility]  Daily Progress Note   Subjective  -   Improved breathing, improved chest pain  Objective Vitals:   10/02/22 2009 10/03/22 0015 10/03/22 0446 10/03/22 0822  BP: 138/76 125/72 131/68 136/64  Pulse: 66 (!) 57 (!) 51 (!) 53  Resp: 17 18 18 20   Temp: 98.1 F (36.7 C) 98.2 F (36.8 C) 97.7 F (36.5 C) (!) 97.4 F (36.3 C)  TempSrc:  Oral    SpO2: 94% 93% 100% 97%  Weight:      Height:        Intake/Output Summary (Last 24 hours) at 10/03/2022 0908 Last data filed at 10/03/2022 0600 Gross per 24 hour  Intake 1384.02 ml  Output 0 ml  Net 1384.02 ml    PULM  CTAB, no oxygen CV  RRR VASC  R groin: no hematoma, no bruit, no pulsatile mass  Laboratory CBC    Component Value Date/Time   WBC 12.1 (H) 10/03/2022 0530   HGB 12.6 10/03/2022 0530   HGB 12.7 07/20/2022 0829   HCT 37.1 10/03/2022 0530   HCT 38.0 07/20/2022 0829   PLT 299 10/03/2022 0530   PLT 393 07/20/2022 0829    BMET    Component Value Date/Time   NA 137 10/02/2022 0532   NA 138 07/20/2022 0829   NA 141 04/29/2012 1729   K 3.9 10/02/2022 0532   K 4.0 04/29/2012 1729   CL 109 10/02/2022 0532   CL 108 (H) 04/29/2012 1729   CO2 23 10/02/2022 0532   CO2 30 04/29/2012 1729   GLUCOSE 104 (H) 10/02/2022 0532   GLUCOSE 94 04/29/2012 1729   BUN 22 10/02/2022 0532   BUN 15 07/20/2022 0829   BUN 11 04/29/2012 1729   CREATININE 0.77 10/02/2022 0532   CREATININE 0.75 04/29/2012 1729   CALCIUM 8.6 (L) 10/02/2022 0532   CALCIUM 8.8 04/29/2012 1729   GFRNONAA >60 10/02/2022 0532   GFRNONAA >60 04/29/2012 1729   GFRAA 105 05/23/2019 0915   GFRAA >60 04/29/2012 1729    Assessment/Planning: POD #1 s/p B pulmonary artery thrombectomy  Successful sx relief with thrombectomy Transition to NOAC  Pt can follow up with Dr. Wyn Quaker in a couple of weeks  Leonides Sake, MD, FACS, FSVS covering for [facility]  10/03/2022, 9:08 AM

## 2022-10-03 NOTE — Consult Note (Signed)
ANTICOAGULATION CONSULT NOTE  Pharmacy Consult for Heparin Indication: chest pain/ACS/pulmonary embolism   Allergies  Allergen Reactions   Amlodipine     Patient Measurements: Height: 5\' 6"  (167.6 cm) Weight: 132.9 kg (293 lb) IBW/kg (Calculated) : 59.3 Heparin Dosing Weight: 91.8 kg  Vital Signs: Temp: 97.7 F (36.5 C) (07/27 0446) Temp Source: Oral (07/27 0015) BP: 131/68 (07/27 0446) Pulse Rate: 51 (07/27 0446)  Labs: Recent Labs    10/01/22 1229 10/01/22 1421 10/01/22 1427 10/01/22 2257 10/02/22 0532 10/02/22 1351 10/02/22 2004 10/03/22 0530  HGB 14.2  --   --   --  13.4  --   --  12.6  HCT 42.3  --   --   --  39.4  --   --  37.1  PLT 306  --   --   --  308  --   --  299  APTT  --  27  --   --   --   --   --   --   LABPROT  --  13.5  --   --   --   --   --   --   INR  --  1.0  --   --   --   --   --   --   HEPARINUNFRC  --   --   --    < > 0.49 0.54 0.36 0.49  CREATININE 0.77  --   --   --  0.77  --   --   --   TROPONINIHS 131*  --  135*  --   --   --   --   --    < > = values in this interval not displayed.    Estimated Creatinine Clearance: 102.1 mL/min (by C-G formula based on SCr of 0.77 mg/dL).   Medical History: Past Medical History:  Diagnosis Date   Anemia    Anxiety    Arthritis    Cardiomegaly    Family history of breast cancer    Family history of ovarian cancer    5/22 cancer genetic testing letter sent   Hypertension    Hyperthyroidism    Pulmonary hypertension (HCC)    Sleep apnea    Thyroid disease    Vertigo     Medications:  Medications Prior to Admission  Medication Sig Dispense Refill Last Dose   ALPRAZolam (XANAX) 0.25 MG tablet Take half tab twice a day as needed for severe panic attacks 30 tablet 0 10/01/2022   diltiazem (CARDIZEM CD) 180 MG 24 hr capsule TAKE 1 CAPSULE BY MOUTH DAILY 90 capsule 1 09/30/2022   escitalopram (LEXAPRO) 10 MG tablet TAKE 1 TABLET BY MOUTH ONCE DAILY WITH  SUPPER  FOR  PANIC  ATTACKS 90 tablet  2 09/30/2022   famotidine (PEPCID) 10 MG tablet Take 1 tablet (10 mg total) by mouth daily. 90 tablet 1 09/30/2022   fluticasone (FLONASE) 50 MCG/ACT nasal spray Place 1 spray into both nostrils daily.   09/30/2022   loratadine (CLARITIN) 10 MG tablet Take 10 mg by mouth daily.   Past Week   meloxicam (MOBIC) 15 MG tablet Take 1 tablet (15 mg total) by mouth daily as needed. 90 tablet 1 09/30/2022   Multiple Vitamin (MULTIVITAMIN) tablet Take 1 tablet by mouth daily.   09/30/2022   omeprazole (PRILOSEC) 40 MG capsule TAKE 1 CAPSULE BY MOUTH DAILY FOR HEARTBURN 30 capsule 2 10/01/2022   diclofenac Sodium (VOLTAREN) 1 % GEL  SMARTSIG:Gram(s) Topical 4 Times Daily PRN (Patient not taking: Reported on 10/01/2022)   Not Taking   doxycycline (VIBRA-TABS) 100 MG tablet Take 1 tablet (100 mg total) by mouth 2 (two) times daily. (Patient not taking: Reported on 10/01/2022) 20 tablet 0 Completed Course   ergocalciferol (DRISDOL) 1.25 MG (50000 UT) capsule Take one cap q week (Patient not taking: Reported on 10/01/2022) 12 capsule 3 Completed Course   meclizine (ANTIVERT) 25 MG tablet Take 25 mg by mouth 3 (three) times daily as needed. (Patient not taking: Reported on 10/01/2022)   Completed Course   Scheduled:  Infusions:  PRN:  Anti-infectives (From admission, onward)    Start     Dose/Rate Route Frequency Ordered Stop   10/02/22 0600  ceFAZolin (ANCEF) IVPB 2g/100 mL premix        2 g 200 mL/hr over 30 Minutes Intravenous 30 min pre-op 10/01/22 1700 10/02/22 1478       Assessment: Pharmacy is consulted to start heparin for ACS. 62 y.o. female who presents to the ER for evaluation of 2 days of worsening shortness of breath intermittent chest pain and had an episode yesterday where she woke up from sleep and was diaphoretic with discomfort. Outpatient provider was concern for blood clot due to right calf pain. PMH: HTN, pulm HTN, and thrombocytosis. No DOAC PTA. Trop elevated at 135. D-dimer 13.21 Baseline  labs ordered. CT chest in progress.   Goal of Therapy:  Heparin level 0.3-0.7 units/ml Monitor platelets by anticoagulation protocol: Yes   07/25 2257 HL 0.18, subtherapeutic 07/26 0532 HL 0.49, therapeutic x 1 0726 1351 HL 0.54, therapeutic 3 hr after vascula procedure  0726 2003 HL 0.36, therapeutic x 2 07/27 0563 HL 0.49, therapeutic x 3  Plan: heparin level therapeutic Continue heparin at 1550 units/hr Transition to daily HL. Next HL tomorrow AM CBC daily while on heparin   Otelia Sergeant, PharmD, [facility] 10/03/2022 6:30 AM

## 2022-10-03 NOTE — Evaluation (Signed)
Physical Therapy Evaluation Patient Details Name: Shelley [name] MRN: [id number] DOB: Oct 04, 1960 Today's Date: 10/03/2022  History of Present Illness  Pt is a 62 year old female admitted with acute saddle PE and with noted cardiomegaly, now s/p thrombectomy 10/02/22. PMH includes: pulmonary hypertension, Anxiety/Depression, GERD, seasonal allergies, and arthritis.   Clinical Impression  Pt was pleasant and motivated to participate during the session and put forth good effort throughout. Pt taken through graded intensity amb per below with SpO2 and HR WNL throughout on room air. Pt's HR ranged from 58 at rest to 70 bpm after longest walk with SpO2 96-100% throughout and no adverse symptoms noted other than baseline 4/10 RUQ chest pain that pt stated was told was normal after her procedure, MD notified.  Pt generally required extra time and effort with functional tasks but was steady without overt LOB with standing activities with a RW.   During amb 10 feet without an AD pt presented with mild drifting left/right and needed to reach out for UE support.  Pt will benefit from continued PT services upon discharge to safely address deficits listed in patient problem list for decreased caregiver assistance and eventual return to PLOF.          Assistance Recommended at Discharge Intermittent Supervision/Assistance  If plan is discharge home, recommend the following:  Can travel by private vehicle  A little help with walking and/or transfers;A little help with bathing/dressing/bathroom;Assistance with cooking/housework;Help with stairs or ramp for entrance;Assist for transportation        Equipment Recommendations Rolling walker (2 wheels)  Recommendations for Other Services       Functional Status Assessment Patient has had a recent decline in their functional status and demonstrates the ability to make significant improvements in function in a reasonable and predictable amount of time.      Precautions / Restrictions Precautions Precautions: Fall Restrictions Weight Bearing Restrictions: No      Mobility  Bed Mobility Overal bed mobility: Needs Assistance Bed Mobility: Supine to Sit     Supine to sit: Supervision     General bed mobility comments: increased time and effort with use of bed rails    Transfers Overall transfer level: Needs assistance Equipment used: None, Rolling walker (2 wheels) Transfers: Sit to/from Stand Sit to Stand: Min guard           General transfer comment: Min extra time and effort to come to standing but good stability throughout    Ambulation/Gait Ambulation/Gait assistance: Supervision Gait Distance (Feet): 10 Feet x 1 without AD, 30 Feet x 1 and 70 Feet x 1 both with RW Assistive device: Rolling walker (2 wheels), None Gait Pattern/deviations: Step-through pattern, Decreased step length - right, Decreased step length - left Gait velocity: decreased     General Gait Details: Very slow cadence but generally steady with the RW with no overt LOB; limited amb in room without an AD with pt presenting with mild drifting and need to reach out to furniture for support  Stairs            Wheelchair Mobility     Tilt Bed    Modified Rankin (Stroke Patients Only)       Balance Overall balance assessment: Needs assistance Sitting-balance support: Feet supported Sitting balance-Leahy Scale: Normal     Standing balance support: Bilateral upper extremity supported, During functional activity, Reliant on assistive device for balance, No upper extremity supported Standing balance-Leahy Scale: Fair  Pertinent Vitals/Pain Pain Assessment Pain Assessment: 0-10 Pain Score: 4  Pain Location: General RUQ chest pain from procedure per patient Pain Descriptors / Indicators: Sore Pain Intervention(s): Monitored during session    Home Living Family/patient expects to be discharged  to:: Private residence Living Arrangements: Other relatives Available Help at Discharge: Family;Available 24 hours/day Type of Home: House Home Access: Stairs to enter Entrance Stairs-Rails: Right;Left (too wide for both) Entrance Stairs-Number of Steps: 3   Home Layout: One level Home Equipment: None      Prior Function Prior Level of Function : Independent/Modified Independent             Mobility Comments: Ind amb without an AD community distances, no fall history ADLs Comments: Ind with ADLs     Hand Dominance   Dominant Hand: Right    Extremity/Trunk Assessment   Upper Extremity Assessment Upper Extremity Assessment: Overall WFL for tasks assessed    Lower Extremity Assessment Lower Extremity Assessment: Generalized weakness       Communication   Communication: No difficulties  Cognition Arousal/Alertness: Awake/alert Behavior During Therapy: WFL for tasks assessed/performed Overall Cognitive Status: Within Functional Limits for tasks assessed                                          General Comments      Exercises     Assessment/Plan    PT Assessment Patient needs continued PT services  PT Problem List Decreased strength;Decreased activity tolerance;Decreased balance;Decreased mobility;Decreased knowledge of use of DME       PT Treatment Interventions DME instruction;Gait training;Stair training;Functional mobility training;Therapeutic activities;Therapeutic exercise;Balance training;Patient/family education    PT Goals (Current goals can be found in the Care Plan section)  Acute Rehab PT Goals Patient Stated Goal: To get stronger and return home PT Goal Formulation: With patient Time For Goal Achievement: 10/16/22 Potential to Achieve Goals: Good    Frequency Min 1X/week     Co-evaluation               AM-PAC PT "6 Clicks" Mobility  Outcome Measure Help needed turning from your back to your side while in a flat  bed without using bedrails?: A Little Help needed moving from lying on your back to sitting on the side of a flat bed without using bedrails?: A Little Help needed moving to and from a bed to a chair (including a wheelchair)?: A Little Help needed standing up from a chair using your arms (e.g., wheelchair or bedside chair)?: A Little Help needed to walk in hospital room?: A Little Help needed climbing 3-5 steps with a railing? : A Little 6 Click Score: 18    End of Session Equipment Utilized During Treatment: Gait belt Activity Tolerance: Patient tolerated treatment well Patient left: Other (comment);with family/visitor present;with call bell/phone within reach (Pt left in BR for toileting at end of session with education to call nursing when complete) Nurse Communication: Mobility status;Other (comment) (MD notified of 4/10 RUQ chest pain not effected by activity) PT Visit Diagnosis: Unsteadiness on feet (R26.81);Difficulty in walking, not elsewhere classified (R26.2);Muscle weakness (generalized) (M62.81)    Time: 5284-1324 PT Time Calculation (min) (ACUTE ONLY): 26 min   Charges:   PT Evaluation $PT Eval Moderate Complexity: 1 Mod PT Treatments $Gait Training: 8-22 mins PT General Charges $$ ACUTE PT VISIT: 1 Visit       D.  Elly Modena PT, DPT 10/03/22, 4:15 PM

## 2022-10-03 NOTE — Progress Notes (Signed)
PROGRESS NOTE    Shelley [name]   ZOX:[id number] DOB: 06-15-1960  DOA: 10/01/2022 Date of Service: 10/03/22 PCP: Carlean Jews, PA-C     Brief Narrative / Hospital Course:  Shelley [name] is a 62 y.o. female w/ PMH HTN, pulmonary hypertension, Anxiety/Depression, GERD, seasonal allergies, arthritis. She presents from home/doctor's office to the ED on 10/01/22  for evaluation of 2 days of worsening shortness of breath intermittent chest pain and had an episode yesterday where she woke up from sleep and was diaphoretic with discomfort.  Has been having some swelling in her lower leg as well.  Does wear CPAP at home.  Has not had any cough or major congestion.  No fevers. Reports R leg pain few weeks, has been following w/ ortho and less mobile than usual.  07/25: admitted for PE. Tx heparin gtt, vascular consult  07/26: thrombectomy this morning. Echo no concerns. [place] negative for DVT LE. Remains on heparin gtt. PT/OT to assess.  07/27: d/c heparin this afternoon and start Eliquis. PT recs for HH, TOC to work on this, should have everything in place by tomorrow and plan for discharge tomorrow relatively early AM    Consultants:  Vascular surgery   Procedures: 10/02/22 thrombectomy w/ Dr Wyn Quaker  "Thrombolysis with 8 mg of tPA, 4 mg in each of the main pulmonary arteries. Mechanical thrombectomy using the penumbra CAT 12 catheter to the right upper lobe, right middle lobe, and right lower lobe pulmonary arteries as well as the left lower lobe and left upper lobe pulmonary arteries      ASSESSMENT & PLAN:   Principal Problem:   Acute saddle pulmonary embolism (HCC) Active Problems:   Hypertension   Acquired hypothyroidism   Seasonal allergies   Cardiomegaly   Pulmonary hypertension, primary (HCC)   Shortness of breath   OSA (obstructive sleep apnea)   Gastroesophageal reflux disease without esophagitis   Anxiety and depression   Pulmonary embolism (HCC)  Acute  saddle pulmonary embolism (HCC) with R heart strain  Shortness of breath without respiratory failure  Heparin gtt --> DOAC w/ Eliquis today  Vascular follow outpatient  S/p thrombectomy 10/02/22  Echocardiogram no concerns   Cardiomegaly Pulmonary hypertension, primary (HCC) Right atrium and right ventricle and the pulmonary outflow tract appears quite dilated when evaluated in thrombectomy procedure Echo no concerns   Hypertension BP slightly above normal/goal Continue home diltiazem    Leukocytosis Question reactive, no infectious source noted  Monitor CBC   Acquired hypothyroidism On problem list, no meds listed TSH   Seasonal allergies Continue home loratadine   OSA (obstructive sleep apnea) CPAP at bedtime    Gastroesophageal reflux disease without esophagitis Continue home famotidine, omeprazole or formulary PPI   Anxiety and depression Continue home escitalopram and alprazolam    Osteoarthritis Hold po NSAID for now but topical NSAID ok Pain management w/ PRN acetaminophen mild/moderate pain, oxycodone severe pain        DVT prophylaxis: heparin Pertinent IV fluids/nutrition: restart diet following thrombectomy  Central lines / invasive devices: none  Code Status: FULL CODE see H&P ACP documentation reviewed: 10/02/22 none on file   Current Admission Status: inpatient   TOC needs / Dispo plan: HH/DME Barriers to discharge / significant pending items: TOC to arrange HH/DME for tomorrow              Subjective / Brief ROS:  Patient reports doing well this morning no concerns Denies CP now but had some tightness overnight  Very minimal SOB Pain controlled, RLE still sore .  Denies new weakness.  Tolerating diet .  Reports no concerns w/ urination/defecation.   Family Communication: family at bedside on rounds     Objective Findings:  Vitals:   10/03/22 0015 10/03/22 0446 10/03/22 0822 10/03/22 1227  BP: 125/72 131/68 136/64 122/71   Pulse: (!) 57 (!) 51 (!) 53 (!) 58  Resp: 18 18 20  (!) 22  Temp: 98.2 F (36.8 C) 97.7 F (36.5 C) (!) 97.4 F (36.3 C) 97.7 F (36.5 C)  TempSrc: Oral     SpO2: 93% 100% 97% 100%  Weight:      Height:        Intake/Output Summary (Last 24 hours) at 10/03/2022 1540 Last data filed at 10/03/2022 0600 Gross per 24 hour  Intake 840 ml  Output 0 ml  Net 840 ml   Filed Weights   10/01/22 1225 10/01/22 2100 10/02/22 0722  Weight: 132.9 kg (!) 137 kg 132.9 kg    Examination:  Physical Exam Constitutional:      General: She is not in acute distress.    Appearance: She is obese. She is not ill-appearing.  Cardiovascular:     Rate and Rhythm: Normal rate and regular rhythm.  Pulmonary:     Effort: Pulmonary effort is normal.     Breath sounds: Normal breath sounds.  Musculoskeletal:     Right lower leg: No edema.     Left lower leg: No edema.  Neurological:     General: No focal deficit present.     Mental Status: She is alert and oriented to person, place, and time.  Psychiatric:        Mood and Affect: Mood normal.        Behavior: Behavior normal.          Scheduled Medications:   diltiazem  180 mg Oral Daily   escitalopram  10 mg Oral QHS   famotidine  10 mg Oral Daily   loratadine  10 mg Oral Daily   pantoprazole  40 mg Oral Daily    Continuous Infusions:    PRN Medications:  acetaminophen **OR** acetaminophen, ALPRAZolam, bisacodyl, diclofenac Sodium, hydrALAZINE, ondansetron **OR** ondansetron (ZOFRAN) IV, mouth rinse, oxyCODONE, polyethylene glycol  Antimicrobials from admission:  Anti-infectives (From admission, onward)    Start     Dose/Rate Route Frequency Ordered Stop   10/02/22 0600  ceFAZolin (ANCEF) IVPB 2g/100 mL premix        2 g 200 mL/hr over 30 Minutes Intravenous 30 min pre-op 10/01/22 1700 10/02/22 9562           Data Reviewed:  I have personally reviewed the following...  CBC: Recent Labs  Lab 10/01/22 1229  10/02/22 0532 10/03/22 0530  WBC 14.3* 12.8* 12.1*  HGB 14.2 13.4 12.6  HCT 42.3 39.4 37.1  MCV 86.7 85.1 86.1  PLT 306 308 299   Basic Metabolic Panel: Recent Labs  Lab 10/01/22 1229 10/02/22 0532  NA 141 137  K 3.9 3.9  CL 113* 109  CO2 27 23  GLUCOSE 89 104*  BUN 17 22  CREATININE 0.77 0.77  CALCIUM 8.8* 8.6*   GFR: Estimated Creatinine Clearance: 102.1 mL/min (by C-G formula based on SCr of 0.77 mg/dL). Liver Function Tests: No results for input(s): "AST", "ALT", "ALKPHOS", "BILITOT", "PROT", "ALBUMIN" in the last 168 hours. No results for input(s): "LIPASE", "AMYLASE" in the last 168 hours. No results for input(s): "AMMONIA" in the last 168 hours.  Coagulation Profile: Recent Labs  Lab 10/01/22 1421  INR 1.0   Cardiac Enzymes: No results for input(s): "CKTOTAL", "CKMB", "CKMBINDEX", "TROPONINI" in the last 168 hours. BNP (last 3 results) No results for input(s): "PROBNP" in the last 8760 hours. HbA1C: No results for input(s): "HGBA1C" in the last 72 hours. CBG: No results for input(s): "GLUCAP" in the last 168 hours. Lipid Profile: No results for input(s): "CHOL", "HDL", "LDLCALC", "TRIG", "CHOLHDL", "LDLDIRECT" in the last 72 hours. Thyroid Function Tests: Recent Labs    10/02/22 0532  TSH 2.455   Anemia Panel: No results for input(s): "VITAMINB12", "FOLATE", "FERRITIN", "TIBC", "IRON", "RETICCTPCT" in the last 72 hours. Most Recent Urinalysis On File:     Component Value Date/Time   COLORURINE YELLOW (A) 12/31/2014 2340   APPEARANCEUR Clear 07/27/2022 1558   LABSPEC 1.011 12/31/2014 2340   PHURINE 5.0 12/31/2014 2340   GLUCOSEU Negative 07/27/2022 1558   HGBUR NEGATIVE 12/31/2014 2340   BILIRUBINUR Negative 07/27/2022 1558   KETONESUR NEGATIVE 12/31/2014 2340   PROTEINUR Negative 07/27/2022 1558   PROTEINUR NEGATIVE 12/31/2014 2340   UROBILINOGEN 0.2 07/31/2007 1750   NITRITE Negative 07/27/2022 1558   NITRITE NEGATIVE 12/31/2014 2340    LEUKOCYTESUR 1+ (A) 07/27/2022 1558   Sepsis Labs: @LABRCNTIP (procalcitonin:4,lacticidven:4) Microbiology: No results found for this or any previous visit (from the past 240 hour(s)).    Radiology Studies last 3 days: ECHOCARDIOGRAM COMPLETE  Result Date: 10/02/2022    ECHOCARDIOGRAM REPORT   Patient Name:   KARMIN COLAO Date of Exam: 10/02/2022 Medical Rec #:  [id number]          Height:       66.0 in Accession #:    [id number]         Weight:       293.0 lb Date of Birth:  07/27/1960           BSA:          2.352 m Patient Age:    62 years           BP:           150/78 mmHg Patient Gender: F                  HR:           60 bpm. Exam Location:  [facility] Procedure: 2D Echo, Cardiac Doppler and Color Doppler Indications:     Pulmonary embolus I26.09                  Pulmonary hypertension I27.2  History:         Patient has prior history of Echocardiogram examinations, most                  recent 12/18/2019. Pulmonary HTN; Risk Factors:Hypertension.  Sonographer:     Cristela Blue Referring Phys:  [id number] Sunnie Nielsen Diagnosing Phys: Julien Nordmann MD  Sonographer Comments: Technically difficult study due to poor echo windows, no apical window and no subcostal window. Pt was in room recovering after vascular procedure on her leg---she was kept supine for echo. IMPRESSIONS  1. Left ventricular ejection fraction, by estimation, is 60 to 65%. The left ventricle has normal function. The left ventricle has no regional wall motion abnormalities. Left ventricular diastolic parameters are indeterminate.  2. Right ventricular systolic function is normal. The right ventricular size is normal. Tricuspid regurgitation signal is inadequate for assessing PA pressure.  3. The mitral valve is normal in structure.  No evidence of mitral valve regurgitation. No evidence of mitral stenosis.  4. The aortic valve has an indeterminant number of cusps. Aortic valve regurgitation is not visualized. No aortic stenosis is  present.  5. The inferior vena cava is normal in size with greater than 50% respiratory variability, suggesting right atrial pressure of 3 mmHg. FINDINGS  Left Ventricle: Left ventricular ejection fraction, by estimation, is 60 to 65%. The left ventricle has normal function. The left ventricle has no regional wall motion abnormalities. The left ventricular internal cavity size was normal in size. There is  no left ventricular hypertrophy. Left ventricular diastolic parameters are indeterminate. Right Ventricle: The right ventricular size is normal. No increase in right ventricular wall thickness. Right ventricular systolic function is normal. Tricuspid regurgitation signal is inadequate for assessing PA pressure. Left Atrium: Left atrial size was normal in size. Right Atrium: Right atrial size was normal in size. Pericardium: There is no evidence of pericardial effusion. Mitral Valve: The mitral valve is normal in structure. No evidence of mitral valve regurgitation. No evidence of mitral valve stenosis. Tricuspid Valve: The tricuspid valve is normal in structure. Tricuspid valve regurgitation is not demonstrated. No evidence of tricuspid stenosis. Aortic Valve: The aortic valve has an indeterminant number of cusps. Aortic valve regurgitation is not visualized. No aortic stenosis is present. Pulmonic Valve: The pulmonic valve was normal in structure. Pulmonic valve regurgitation is not visualized. No evidence of pulmonic stenosis. Aorta: The aortic root is normal in size and structure. Venous: The inferior vena cava is normal in size with greater than 50% respiratory variability, suggesting right atrial pressure of 3 mmHg. IAS/Shunts: No atrial level shunt detected by color flow Doppler.  LEFT VENTRICLE PLAX 2D LVIDd:         4.30 cm LVIDs:         3.10 cm LV PW:         1.20 cm LV IVS:        1.10 cm LVOT diam:     2.00 cm LVOT Area:     3.14 cm  LEFT ATRIUM         Index LA diam:    2.30 cm 0.98 cm/m   AORTA Ao  Root diam: 3.10 cm  SHUNTS Systemic Diam: 2.00 cm Julien Nordmann MD Electronically signed by Julien Nordmann MD Signature Date/Time: 10/02/2022/12:16:01 PM    Final    PERIPHERAL VASCULAR CATHETERIZATION  Result Date: 10/02/2022 See surgical note for result.  US Venous Img Lower Bilateral  Result Date: 10/01/2022 CLINICAL DATA:  Shortness of breath with elevated D-dimer. Saddle PE. EXAM: BILATERAL LOWER EXTREMITY VENOUS DOPPLER ULTRASOUND TECHNIQUE: Gray-scale sonography with graded compression, as well as color Doppler and duplex ultrasound were performed to evaluate the lower extremity deep venous systems from the level of the common femoral vein and including the common femoral, femoral, profunda femoral, popliteal and calf veins including the posterior tibial, peroneal and gastrocnemius veins when visible. The superficial great saphenous vein was also interrogated. Spectral Doppler was utilized to evaluate flow at rest and with distal augmentation maneuvers in the common femoral, femoral and popliteal veins. COMPARISON:  Left lower extremity venous Doppler ultrasound 07/18/2013. FINDINGS: RIGHT LOWER EXTREMITY Common Femoral Vein: No evidence of thrombus. Normal compressibility, respiratory phasicity and response to augmentation. Saphenofemoral Junction: No evidence of thrombus. Normal compressibility and flow on color Doppler imaging. Profunda Femoral Vein: No evidence of thrombus. Normal compressibility and flow on color Doppler imaging. Femoral Vein: No evidence of thrombus. Normal  compressibility, respiratory phasicity and response to augmentation. Popliteal Vein: No evidence of thrombus. Normal compressibility, respiratory phasicity and response to augmentation. Calf Veins: No evidence of thrombus. Normal compressibility and flow on color Doppler imaging. Superficial Great Saphenous Vein: No evidence of thrombus. Normal compressibility. Venous Reflux:  None. Other Findings:  None. LEFT LOWER EXTREMITY  Common Femoral Vein: No evidence of thrombus. Normal compressibility, respiratory phasicity and response to augmentation. Saphenofemoral Junction: No evidence of thrombus. Normal compressibility and flow on color Doppler imaging. Profunda Femoral Vein: No evidence of thrombus. Normal compressibility and flow on color Doppler imaging. Femoral Vein: No evidence of thrombus. Normal compressibility, respiratory phasicity and response to augmentation. Popliteal Vein: No evidence of thrombus. Normal compressibility, respiratory phasicity and response to augmentation. Calf Veins: No evidence of thrombus. Normal compressibility and flow on color Doppler imaging. Superficial Great Saphenous Vein: No evidence of thrombus. Normal compressibility. Venous Reflux:  None. Other Findings:  None. IMPRESSION: No evidence of deep venous thrombosis in either lower extremity. Electronically Signed   By: Darliss Cheney M.D.   On: 10/01/2022 18:00   CT Angio Chest Pulmonary Embolism (PE) W or WO Contrast  Result Date: 10/01/2022 CLINICAL DATA:  Pulmonary embolus suspected EXAM: CT ANGIOGRAPHY CHEST WITH CONTRAST TECHNIQUE: Multidetector CT imaging of the chest was performed using the standard protocol during bolus administration of intravenous contrast. Multiplanar CT image reconstructions and MIPs were obtained to evaluate the vascular anatomy. RADIATION DOSE REDUCTION: This exam was performed according to the departmental dose-optimization program which includes automated exposure control, adjustment of the mA and/or kV according to patient size and/or use of iterative reconstruction technique. CONTRAST:  75mL OMNIPAQUE IOHEXOL 350 MG/ML SOLN COMPARISON:  Chest CT dated May 24, 2020 FINDINGS: Cardiovascular: Nonocclusive saddle pulmonary embolus. Additional bilateral pulmonary emboli seen throughout the lungs in the segmental and subsegmental pulmonary arteries. Mildly elevated RV to LV ratio of1.1. Normal caliber thoracic aorta  with no significant atherosclerotic disease. No visible coronary artery calcifications. Mediastinum/Nodes: Moderate hiatal hernia. Thyroid is unremarkable. No enlarged lymph nodes seen in the chest. Lungs/Pleura: Central airways are patent. Mild atelectasis. No consolidation, pleural effusion or pneumothorax. Upper Abdomen: Atrophic spleen with nodular contour, unchanged when compared with the prior exam. No acute abnormality. Musculoskeletal: No chest wall abnormality. No acute or significant osseous findings. Review of the MIP images confirms the above findings. IMPRESSION: 1. Nonocclusive saddle pulmonary embolus. Additional bilateral pulmonary emboli seen throughout the lungs in the segmental and subsegmental pulmonary arteries. 2. Mildly elevated RV to LV ratio of 1.1, suggestive of right heart strain. Critical Value/emergent results were called by telephone at the time of interpretation on 10/01/2022 at 3:47 pm to provider Dr. Minna Antis, who verbally acknowledged these results. Electronically Signed   By: Allegra Lai M.D.   On: 10/01/2022 15:49   DG Chest 2 View  Result Date: 10/01/2022 CLINICAL DATA:  Shortness of breath. EXAM: CHEST - 2 VIEW COMPARISON:  11/27/2019. FINDINGS: Low lung volumes accentuate the pulmonary vasculature and cardiomediastinal silhouette. No consolidation or pulmonary edema. No pleural effusion or pneumothorax. IMPRESSION: Low lung volumes without evidence of acute cardiopulmonary disease. Electronically Signed   By: Orvan Falconer M.D.   On: 10/01/2022 14:03             LOS: 2 days      Sunnie Nielsen, DO [facility] 10/03/2022, 3:40 PM    Dictation software may have been used to generate the above note. Typos may occur and escape review in typed/dictated notes.  Please contact Dr Lyn Hollingshead directly for clarity if needed.  Staff may message me via secure chat in Epic  but this may not receive an immediate response,  please page me  for urgent matters!  If 7PM-7AM, please contact night coverage [email or web address]

## 2022-10-03 NOTE — Evaluation (Addendum)
Occupational Therapy Evaluation Patient Details Name: Shelley [name] MRN: [id number] DOB: 1960-10-26 Today's Date: 10/03/2022   History of Present Illness Pt is a 62 year old female admitted with acute saddle PE, s/p thrombectomy 10/02/22     Pmh significant for pulmonary hypertension, Anxiety/Depression, GERD, seasonal allergies, arthritis   Clinical Impression   Chart reviewed, pt greeted in bed, agreeable to OT evaluation. Pt is alert and oriented x4, good awareness of current level of functioning. PTA pt is MOD I-I in ADL, IADL, works part time at [employer], amb with no AD, no falls. Pt presents with deficits in activity tolerance, balance, strength on this date affecting safe and optimal ADL completion. Pt is performing household mobility with CGA with RW, grooming at sink level with CGA, anticipate assist for LB dressing, bathing. Pt is performing ADL below PLOF, would benefit from ongoing OT to address functional deficits and to facilitate return to PLOF. OT will continue to follow acutely.    Of note: spo2 >95% on RA throughout, no reports of SOB; HR 60 bpm    Recommendations for follow up therapy are one component of a multi-disciplinary discharge planning process, led by the attending physician.  Recommendations may be updated based on patient status, additional functional criteria and insurance authorization.   Assistance Recommended at Discharge Intermittent Supervision/Assistance  Patient can return home with the following A little help with bathing/dressing/bathroom;Assistance with cooking/housework;Assist for transportation;Help with stairs or ramp for entrance    Functional Status Assessment  Patient has had a recent decline in their functional status and demonstrates the ability to make significant improvements in function in a reasonable and predictable amount of time.  Equipment Recommendations  BSC/3in1    Recommendations for Other Services       Precautions /  Restrictions Precautions Precautions: Fall Restrictions Weight Bearing Restrictions: No      Mobility Bed Mobility Overal bed mobility: Needs Assistance Bed Mobility: Supine to Sit     Supine to sit: Supervision, HOB elevated     General bed mobility comments: increased time, use of bed rails    Transfers Overall transfer level: Needs assistance Equipment used: None, Rolling walker (2 wheels) Transfers: Sit to/from Stand Sit to Stand: Min assist, Min guard           General transfer comment: STS without AD with MIN A, CGA with RW multiple attmempts      Balance Overall balance assessment: Needs assistance Sitting-balance support: Feet supported Sitting balance-Leahy Scale: Good     Standing balance support: Bilateral upper extremity supported, During functional activity, Reliant on assistive device for balance Standing balance-Leahy Scale: Fair                             ADL either performed or assessed with clinical judgement   ADL Overall ADL's : Needs assistance/impaired Eating/Feeding: Set up;Sitting   Grooming: Wash/dry hands;Wash/dry face;Oral care;Standing;Supervision/safety;Min guard Grooming Details (indicate cue type and reason): with RW at sink level, intermittent vcs for RW technique         Upper Body Dressing : Min guard;Sitting Upper Body Dressing Details (indicate cue type and reason): gown Lower Body Dressing: Minimal assistance Lower Body Dressing Details (indicate cue type and reason): anticipate Toilet Transfer: Supervision/safety;Min guard;Rolling walker (2 wheels) Toilet Transfer Details (indicate cue type and reason): simulated to bedside chair, intermittent vcs for RW use         Functional mobility during ADLs: Supervision/safety;Min  guard;Rolling walker (2 wheels) (approx 10' one attempt, 60' one attempt)       Vision Patient Visual Report: No change from baseline              Pertinent Vitals/Pain Pain  Assessment Pain Assessment: No/denies pain     Hand Dominance Right   Extremity/Trunk Assessment Upper Extremity Assessment Upper Extremity Assessment: Overall WFL for tasks assessed   Lower Extremity Assessment Lower Extremity Assessment: Generalized weakness   Cervical / Trunk Assessment Cervical / Trunk Assessment: Normal   Communication Communication Communication: No difficulties   Cognition Arousal/Alertness: Awake/alert Behavior During Therapy: WFL for tasks assessed/performed Overall Cognitive Status: Within Functional Limits for tasks assessed                                       General Comments       Exercises Other Exercises Other Exercises: edu re: role of OT, role of rehab, discharge recommendations, home DME use for safe ADL completion        Home Living Family/patient expects to be discharged to:: Private residence Living Arrangements: Other relatives (grand daugther) Available Help at Discharge: Family;Available 24 hours/day (62 year old grand daugther) Type of Home: House (duplex) Home Access: Stairs to enter [employer] of Steps: 3 Entrance Stairs-Rails: Right;Left Home Layout: One level     Bathroom Shower/Tub: Chief Strategy Officer: Standard Bathroom Accessibility: Yes   Home Equipment: None          Prior Functioning/Environment Prior Level of Function : Independent/Modified Independent                        OT Problem List: Decreased strength;Decreased activity tolerance;Impaired balance (sitting and/or standing);Decreased knowledge of use of DME or AE      OT Treatment/Interventions: Self-care/ADL training;Balance training;Therapeutic exercise;DME and/or AE instruction;Therapeutic activities;Patient/family education    OT Goals(Current goals can be found in the care plan section) Acute Rehab OT Goals Patient Stated Goal: go home OT Goal Formulation: With patient Time For Goal  Achievement: 10/16/22 Potential to Achieve Goals: Good ADL Goals Pt Will Perform Grooming: with modified independence;standing Pt Will Perform Lower Body Dressing: with modified independence;sit to/from stand Pt Will Transfer to Toilet: with modified independence;ambulating Pt Will Perform Toileting - Clothing Manipulation and hygiene: with modified independence;sit to/from stand  OT Frequency: Min 1X/week       AM-PAC OT "6 Clicks" Daily Activity     Outcome Measure Help from another person eating meals?: None Help from another person taking care of personal grooming?: None Help from another person toileting, which includes using toliet, bedpan, or urinal?: None Help from another person bathing (including washing, rinsing, drying)?: A Little Help from another person to put on and taking off regular upper body clothing?: None Help from another person to put on and taking off regular lower body clothing?: A Little 6 Click Score: 22   End of Session Equipment Utilized During Treatment: Rolling walker (2 wheels);Gait belt Nurse Communication: Mobility status  Activity Tolerance: Patient tolerated treatment well Patient left: in chair;with call bell/phone within reach;with chair alarm set;with family/visitor present  OT Visit Diagnosis: Other abnormalities of gait and mobility (R26.89)                Time: 1027-2536 OT Time Calculation (min): 32 min Charges:  OT General Charges $OT Visit: 1 Visit  OT Evaluation $OT Eval Moderate Complexity: 1 Mod  Oleta Mouse, OTD OTR/L  10/03/22, 9:56 AM

## 2022-10-04 DIAGNOSIS — I2692 Saddle embolus of pulmonary artery without acute cor pulmonale: Secondary | ICD-10-CM | POA: Diagnosis not present

## 2022-10-04 MED ORDER — OXYCODONE HCL 5 MG PO TABS: 2.5000 mg | ORAL_TABLET | Freq: Four times a day (QID) | ORAL | 0 refills | Status: DC | PRN

## 2022-10-04 MED ORDER — APIXABAN 5 MG PO TABS: ORAL_TABLET | ORAL | 0 refills | Status: DC

## 2022-10-04 NOTE — Discharge Summary (Signed)
Physician Discharge Summary   Patient: Shelley [name] MRN: [id number]  DOB: 1960-04-05   Admit:     Date of Admission: 10/01/2022 Admitted from: home   Discharge: Date of discharge: 10/04/22 Disposition: Home health Condition at discharge: good  CODE STATUS: FULL CODE     Discharge Physician: Sunnie Nielsen, DO [facility]     PCP: Carlean Jews, PA-C  Recommendations for Outpatient Follow-up:  Follow up with PCP McDonough, Salomon Fick, PA-C in 1-2 weeks Please obtain labs/tests: CBC, BMP in 1-2 weeks Follow w/ vascular surgery - their office will call  Please follow up on the following pending results: none   Discharge Instructions     Diet - low sodium heart healthy   Complete by: As directed    Increase activity slowly   Complete by: As directed          Discharge Diagnoses: Principal Problem:   Acute saddle pulmonary embolism (HCC) Active Problems:   Hypertension   Acquired hypothyroidism   Seasonal allergies   Cardiomegaly   Pulmonary hypertension, primary (HCC)   Shortness of breath   OSA (obstructive sleep apnea)   Gastroesophageal reflux disease without esophagitis   Anxiety and depression   Pulmonary embolism [facility])       Hospital Course: Shelley [name] is a 62 y.o. female w/ PMH HTN, pulmonary hypertension, Anxiety/Depression, GERD, seasonal allergies, arthritis. She presents from home/doctor's office to the ED on 10/01/22  for evaluation of 2 days of worsening shortness of breath intermittent chest pain and had an episode yesterday where she woke up from sleep and was diaphoretic with discomfort.  Has been having some swelling in her lower leg as well.  Does wear CPAP at home.  Has not had any cough or major congestion.  No fevers. Reports R leg pain few weeks, has been following w/ ortho and less mobile than usual.  07/25: admitted for PE. Tx heparin gtt, vascular consult  07/26: thrombectomy this morning. Echo no  concerns. [place] negative for DVT LE. Remains on heparin gtt. PT/OT to assess.  07/27: d/c heparin this afternoon and start Eliquis. PT recs for HH, TOC to work on this, should have everything in place by tomorrow and plan for discharge tomorrow relatively early AM 07/28: DME and HH confirmed, pt has no complaints/concerns, stable for discharge     Consultants:  Vascular surgery   Procedures: 10/02/22 thrombectomy w/ Dr Wyn Quaker  "Thrombolysis with 8 mg of tPA, 4 mg in each of the main pulmonary arteries. Mechanical thrombectomy using the penumbra CAT 12 catheter to the right upper lobe, right middle lobe, and right lower lobe pulmonary arteries as well as the left lower lobe and left upper lobe pulmonary arteries      ASSESSMENT & PLAN:   Acute saddle pulmonary embolism (HCC) with R heart strain  Shortness of breath without respiratory failure  S/p thrombectomy 10/02/22 Eliquis  Vascular follow outpatient  Echocardiogram no concerns despite R heart dilation on thrombectomy    Cardiomegaly Pulmonary hypertension, primary (HCC) Right atrium and right ventricle and the pulmonary outflow tract appears quite dilated when evaluated in thrombectomy procedure Echo no concerns   Hypertension BP slightly above normal/goal Continue home diltiazem    Leukocytosis Question reactive, no infectious source noted  Monitor CBC   Acquired hypothyroidism On problem list, no meds listed TSH Follow outpatient    Seasonal allergies Continue home loratadine   OSA (obstructive sleep apnea) CPAP at bedtime  Gastroesophageal reflux disease without esophagitis Continue home famotidine, omeprazole or formulary PPI   Anxiety and depression Continue home escitalopram and alprazolam    Osteoarthritis Hold po NSAID for now but topical NSAID ok Pain management w/ PRN acetaminophen mild/moderate pain, oxycodone severe pain                 Discharge Instructions  Allergies as of  10/04/2022       Reactions   Amlodipine         Medication List     STOP taking these medications    diclofenac Sodium 1 % Gel Commonly known as: VOLTAREN   doxycycline 100 MG tablet Commonly known as: VIBRA-TABS   ergocalciferol 1.25 MG (50000 UT) capsule Commonly known as: Drisdol   meclizine 25 MG tablet Commonly known as: ANTIVERT   meloxicam 15 MG tablet Commonly known as: MOBIC       TAKE these medications    ALPRAZolam 0.25 MG tablet Commonly known as: XANAX Take half tab twice a day as needed for severe panic attacks   apixaban 5 MG Tabs tablet Commonly known as: ELIQUIS Take 2 tablets (10 mg total) by mouth 2 (two) times daily for 6 days, THEN 1 tablet (5 mg total) 2 (two) times daily. Start taking on: October 04, 2022   diltiazem 180 MG 24 hr capsule Commonly known as: CARDIZEM CD TAKE 1 CAPSULE BY MOUTH DAILY   escitalopram 10 MG tablet Commonly known as: LEXAPRO TAKE 1 TABLET BY MOUTH ONCE DAILY WITH  SUPPER  FOR  PANIC  ATTACKS   famotidine 10 MG tablet Commonly known as: PEPCID Take 1 tablet (10 mg total) by mouth daily.   fluticasone 50 MCG/ACT nasal spray Commonly known as: FLONASE Place 1 spray into both nostrils daily.   loratadine 10 MG tablet Commonly known as: CLARITIN Take 10 mg by mouth daily.   multivitamin tablet Take 1 tablet by mouth daily.   omeprazole 40 MG capsule Commonly known as: PRILOSEC TAKE 1 CAPSULE BY MOUTH DAILY FOR HEARTBURN   oxyCODONE 5 MG immediate release tablet Commonly known as: Oxy IR/ROXICODONE Take 0.5-1 tablets (2.5-5 mg total) by mouth every 6 (six) hours as needed for moderate pain or severe pain.               Durable Medical Equipment  (From admission, onward)           Start     Ordered   10/04/22 1027  DME Walker  Once       Question Answer Comment  Walker: With 5 Inch Wheels   Patient needs a walker to treat with the following condition Pulmonary embolism (HCC)   Patient  needs a walker to treat with the following condition Right leg pain      10/04/22 1028             Follow-up Information     Dew, Marlow Baars, MD. Call.   Specialties: Vascular Surgery, Radiology, Interventional Cardiology Why: to make follow up appointment for 2 weeks from hospital discharge   Please call Dr. Driscilla Grammes office to make this follow up. Thanks! Contact information: [address] [place] [address] [phone]         Carlean Jews, PA-C. Call.   Specialty: Physician Assistant Why: call to make hospital follow  up appointment ASAP - please be sure to let the receptionsist/scheduler know you were in the hospital Contact information: [address] [place]  [phone]                 Allergies  Allergen Reactions   Amlodipine      Subjective: pt feeling well this morning, no concerns, ambulating, no SOB/CP   Discharge Exam: BP 132/71 (BP Location: Left Arm)   Pulse 66   Temp 98.4 F (36.9 C) (Oral)   Resp 16   Ht 5\' 6"  (1.676 m)   Wt 132.9 kg   LMP  (LMP Unknown)   SpO2 100%   BMI 47.29 kg/m  General: Pt is alert, awake, not in acute distress Cardiovascular: RRR, S1/S2 +, no rubs, no gallops Respiratory: CTA bilaterally, no wheezing, no rhonchi Abdominal: Soft, NT, ND, bowel sounds + Extremities: no edema, no cyanosis     The results of significant diagnostics from this hospitalization (including imaging, microbiology, ancillary and laboratory) are listed below for reference.     Microbiology: No results found for this or any previous visit (from the past 240 hour(s)).   Labs: BNP (last 3 results) No results for input(s): "BNP" in the last 8760 hours. Basic Metabolic Panel: Recent Labs  Lab 10/01/22 1229 10/02/22 0532  NA 141 137  K 3.9 3.9  CL 113* 109  CO2 27 23  GLUCOSE 89 104*  BUN 17 22  CREATININE 0.77 0.77  CALCIUM 8.8* 8.6*   Liver Function Tests: No results for  input(s): "AST", "ALT", "ALKPHOS", "BILITOT", "PROT", "ALBUMIN" in the last 168 hours. No results for input(s): "LIPASE", "AMYLASE" in the last 168 hours. No results for input(s): "AMMONIA" in the last 168 hours. CBC: Recent Labs  Lab 10/01/22 1229 10/02/22 0532 10/03/22 0530  WBC 14.3* 12.8* 12.1*  HGB 14.2 13.4 12.6  HCT 42.3 39.4 37.1  MCV 86.7 85.1 86.1  PLT 306 308 299   Cardiac Enzymes: No results for input(s): "CKTOTAL", "CKMB", "CKMBINDEX", "TROPONINI" in the last 168 hours. BNP: Invalid input(s): "POCBNP" CBG: No results for input(s): "GLUCAP" in the last 168 hours. D-Dimer Recent Labs    10/01/22 1352  DDIMER 13.21*   Hgb A1c No results for input(s): "HGBA1C" in the last 72 hours. Lipid Profile No results for input(s): "CHOL", "HDL", "LDLCALC", "TRIG", "CHOLHDL", "LDLDIRECT" in the last 72 hours. Thyroid function studies Recent Labs    10/02/22 0532  TSH 2.455   Anemia work up No results for input(s): "VITAMINB12", "FOLATE", "FERRITIN", "TIBC", "IRON", "RETICCTPCT" in the last 72 hours. Urinalysis    Component Value Date/Time   COLORURINE YELLOW (A) 12/31/2014 2340   APPEARANCEUR Clear 07/27/2022 1558   LABSPEC 1.011 12/31/2014 2340   PHURINE 5.0 12/31/2014 2340   GLUCOSEU Negative 07/27/2022 1558   HGBUR NEGATIVE 12/31/2014 2340   BILIRUBINUR Negative 07/27/2022 1558   KETONESUR NEGATIVE 12/31/2014 2340   PROTEINUR Negative 07/27/2022 1558   PROTEINUR NEGATIVE 12/31/2014 2340   UROBILINOGEN 0.2 07/31/2007 1750   NITRITE Negative 07/27/2022 1558   NITRITE NEGATIVE 12/31/2014 2340   LEUKOCYTESUR 1+ (A) 07/27/2022 1558   Sepsis Labs Recent Labs  Lab 10/01/22 1229 10/02/22 0532 10/03/22 0530  WBC 14.3* 12.8* 12.1*   Microbiology No results found for this or any previous visit (from the past 240 hour(s)). Imaging ECHOCARDIOGRAM COMPLETE  Result Date: 10/02/2022    ECHOCARDIOGRAM REPORT   Patient Name:   Shelley [name] Date of Exam:  10/02/2022 Medical Rec #:  [id number]          Height:       66.0 in Accession #:  [id number]         Weight:       293.0 lb Date of Birth:  Oct 29, 1960           BSA:          2.352 m Patient Age:    62 years           BP:           150/78 mmHg Patient Gender: F                  HR:           60 bpm. Exam Location:  [facility] Procedure: 2D Echo, Cardiac Doppler and Color Doppler Indications:     Pulmonary embolus I26.09                  Pulmonary hypertension I27.2  History:         Patient has prior history of Echocardiogram examinations, most                  recent 12/18/2019. Pulmonary HTN; Risk Factors:Hypertension.  Sonographer:     Cristela Blue Referring Phys:  [id number] Sunnie Nielsen Diagnosing Phys: Julien Nordmann MD  Sonographer Comments: Technically difficult study due to poor echo windows, no apical window and no subcostal window. Pt was in room recovering after vascular procedure on her leg---she was kept supine for echo. IMPRESSIONS  1. Left ventricular ejection fraction, by estimation, is 60 to 65%. The left ventricle has normal function. The left ventricle has no regional wall motion abnormalities. Left ventricular diastolic parameters are indeterminate.  2. Right ventricular systolic function is normal. The right ventricular size is normal. Tricuspid regurgitation signal is inadequate for assessing PA pressure.  3. The mitral valve is normal in structure. No evidence of mitral valve regurgitation. No evidence of mitral stenosis.  4. The aortic valve has an indeterminant number of cusps. Aortic valve regurgitation is not visualized. No aortic stenosis is present.  5. The inferior vena cava is normal in size with greater than 50% respiratory variability, suggesting right atrial pressure of 3 mmHg. FINDINGS  Left Ventricle: Left ventricular ejection fraction, by estimation, is 60 to 65%. The left ventricle has normal function. The left ventricle has no regional wall motion abnormalities. The left ventricular  internal cavity size was normal in size. There is  no left ventricular hypertrophy. Left ventricular diastolic parameters are indeterminate. Right Ventricle: The right ventricular size is normal. No increase in right ventricular wall thickness. Right ventricular systolic function is normal. Tricuspid regurgitation signal is inadequate for assessing PA pressure. Left Atrium: Left atrial size was normal in size. Right Atrium: Right atrial size was normal in size. Pericardium: There is no evidence of pericardial effusion. Mitral Valve: The mitral valve is normal in structure. No evidence of mitral valve regurgitation. No evidence of mitral valve stenosis. Tricuspid Valve: The tricuspid valve is normal in structure. Tricuspid valve regurgitation is not demonstrated. No evidence of tricuspid stenosis. Aortic Valve: The aortic valve has an indeterminant number of cusps. Aortic valve regurgitation is not visualized. No aortic stenosis is present. Pulmonic Valve: The pulmonic valve was normal in structure. Pulmonic valve regurgitation is not visualized. No evidence of pulmonic stenosis. Aorta: The aortic root is normal in size and structure. Venous: The inferior vena cava is normal in size with greater than 50% respiratory variability, suggesting right atrial pressure of 3 mmHg. IAS/Shunts: No atrial level shunt detected by color flow Doppler.  LEFT  VENTRICLE PLAX 2D LVIDd:         4.30 cm LVIDs:         3.10 cm LV PW:         1.20 cm LV IVS:        1.10 cm LVOT diam:     2.00 cm LVOT Area:     3.14 cm  LEFT ATRIUM         Index LA diam:    2.30 cm 0.98 cm/m   AORTA Ao Root diam: 3.10 cm  SHUNTS Systemic Diam: 2.00 cm Julien Nordmann MD Electronically signed by Julien Nordmann MD Signature Date/Time: 10/02/2022/12:16:01 PM    Final    PERIPHERAL VASCULAR CATHETERIZATION  Result Date: 10/02/2022 See surgical note for result.     Time coordinating discharge: over 30 minutes  SIGNED:  Sunnie Nielsen DO [facility]

## 2022-10-04 NOTE — Plan of Care (Signed)

## 2022-10-04 NOTE — TOC Progression Note (Signed)
Transition of Care [facility]) - Progression Note    Patient Details  Name: Shelley [name] MRN: [id number] Date of Birth: Jun 27, 1960  Transition of Care [facility]) CM/SW Contact  Bing Quarry, RN Phone Number: 10/04/2022, 10:08 AM  Clinical Narrative: 10/04/22: Frances Furbish accepted for [facility] services pending specific orders. DME needs are RW/BSC waiting on order placement before contacting [employer]. Requested orders from provider and notified [employer] of tentative orders coming in.   Gabriel Cirri MSN RN CM  Transitions of Care Department [facility] [phone] Weekends Only            Expected Discharge Plan and Services                                               Social Determinants of Health (SDOH) Interventions SDOH Screenings   Food Insecurity: No Food Insecurity (10/01/2022)  Housing: Low Risk  (10/01/2022)  Transportation Needs: No Transportation Needs (10/01/2022)  Utilities: Not At Risk (10/01/2022)  Alcohol Screen: Low Risk  (07/24/2021)  Depression (PHQ2-9): Low Risk  (07/27/2022)  Tobacco Use: Low Risk  (10/01/2022)    Readmission Risk Interventions     No data to display

## 2022-10-04 NOTE — Plan of Care (Signed)

## 2022-10-04 NOTE — TOC Transition Note (Signed)
Transition of Care [facility]) - CM/SW Discharge Note   Patient Details  Name: Shelley [name] MRN: [id number] Date of Birth: 02-May-1960  Transition of Care [facility]) CM/SW Contact:  Bing Quarry, RN Phone Number: 10/04/2022, 10:47 AM   Clinical Narrative: 7/28: Patient agreeable to [facility] PT/OT services, but declined DME delivery by [employer] as would have to pay out of pocket. RN CM discussed other options to purchase DME if needed including area medical thrift stores. Patient expressed understanding and had no further questions regarding discharge plan for [facility]. Patient has PCP.  Gabriel Cirri MSN RN CM  Transitions of Care Department [facility] [phone] Weekends Only       Final next level of care: Home w Home Health Services Barriers to Discharge: Barriers Resolved   Patient Goals and CMS Choice      Discharge Placement                         Discharge Plan and Services Additional resources added to the After Visit Summary for                  DME Arranged: Walker rolling (Patient declined 2/2 havingn to pay out of pocket.) DME Agency: [facility] Date DME Agency Contacted: 10/04/22 Time DME Agency Contacted: 1044 Representative spoke with at DME Agency: Selena Batten HH Arranged: PT, OT HH Agency: [facility] Date [facility] Agency Contacted: 10/04/22 Time HH Agency Contacted: 1045 Representative spoke with at [facility] Agency: Kandee Keen  Social Determinants of Health (SDOH) Interventions SDOH Screenings   Food Insecurity: No Food Insecurity (10/01/2022)  Housing: Low Risk  (10/01/2022)  Transportation Needs: No Transportation Needs (10/01/2022)  Utilities: Not At Risk (10/01/2022)  Alcohol Screen: Low Risk  (07/24/2021)  Depression (PHQ2-9): Low Risk  (07/27/2022)  Tobacco Use: Low Risk  (10/01/2022)     Readmission Risk Interventions     No data to display

## 2022-10-05 ENCOUNTER — Encounter: Payer: Self-pay | Admitting: Vascular Surgery

## 2022-10-07 ENCOUNTER — Telehealth (INDEPENDENT_AMBULATORY_CARE_PROVIDER_SITE_OTHER): Payer: Self-pay | Admitting: Vascular Surgery

## 2022-10-07 NOTE — Telephone Encounter (Signed)
Can call patient back to ask with one of the pain she is having and where.  And additionally how much bleeding she is actually having

## 2022-10-07 NOTE — Telephone Encounter (Signed)
Patient informed that pain is near the incision site and she is just having a small amount of bleeding which she noticed last night.

## 2022-10-07 NOTE — Telephone Encounter (Signed)
Patient called in she had surgery 10/02/2022 with JD and is stating that she is having some bleeding at the site its not a lot and a lot of pain. There was also "some skin broken around the site and it looked shiny"    Please call and advise

## 2022-10-07 NOTE — Telephone Encounter (Signed)
I called and discussed with patient.  The area is just sore and tender and there are only small drops of blood.  I discussed that this isn't uncommon and that she should use neosporin and a bandaid over the area for a few days and contact us if there are additional questions.

## 2022-10-12 ENCOUNTER — Ambulatory Visit: Payer: BLUE CROSS/BLUE SHIELD | Admitting: Nurse Practitioner

## 2022-10-12 ENCOUNTER — Encounter: Payer: Self-pay | Admitting: Nurse Practitioner

## 2022-10-12 VITALS — BP 140/88 | HR 64 | Temp 98.3°F | Resp 16 | Ht 66.0 in | Wt 288.4 lb

## 2022-10-12 DIAGNOSIS — I2692 Saddle embolus of pulmonary artery without acute cor pulmonale: Secondary | ICD-10-CM

## 2022-10-12 DIAGNOSIS — S31109A Unspecified open wound of abdominal wall, unspecified quadrant without penetration into peritoneal cavity, initial encounter: Secondary | ICD-10-CM

## 2022-10-12 DIAGNOSIS — Z09 Encounter for follow-up examination after completed treatment for conditions other than malignant neoplasm: Secondary | ICD-10-CM | POA: Diagnosis not present

## 2022-10-12 DIAGNOSIS — M150 Primary generalized (osteo)arthritis: Secondary | ICD-10-CM

## 2022-10-12 MED ORDER — TRAMADOL HCL 50 MG PO TABS
50.0000 mg | ORAL_TABLET | Freq: Four times a day (QID) | ORAL | 0 refills | Status: AC | PRN
Start: 2022-10-12 — End: 2022-10-17

## 2022-10-12 MED ORDER — MUPIROCIN 2 % EX OINT
1.0000 | TOPICAL_OINTMENT | Freq: Two times a day (BID) | CUTANEOUS | 2 refills | Status: AC
Start: 2022-10-12 — End: ?

## 2022-10-12 NOTE — Progress Notes (Unsigned)
[facility] [place], [place] [address] [place] [address] [phone]                                   Transitional Care Clinic   [facility] Discharge Acute Issues Care Follow Up                                                                        Patient Demographics  Shelley [name], is a 62 y.o. female  DOB 1961/03/08  MRN [id number].  Primary MD  Carlean Jews, PA-C  Admit: 10/01/22 Discharged: 10/04/22  Reason for TCC follow Up - acute saddle pulmonary embolism    Past Medical History:  Diagnosis Date   Anemia    Anxiety    Arthritis    Cardiomegaly    Family history of breast cancer    Family history of ovarian cancer    5/22 cancer genetic testing letter sent   Hypertension    Hyperthyroidism    Pulmonary hypertension (HCC)    Sleep apnea    Thyroid disease    Vertigo     Past Surgical History:  Procedure Laterality Date   COLONOSCOPY WITH PROPOFOL N/A 02/27/2021   Procedure: COLONOSCOPY WITH PROPOFOL;  Surgeon: Midge Minium, MD;  Location: [facility] ENDOSCOPY;  Service: Endoscopy;  Laterality: N/A;   ESOPHAGOGASTRODUODENOSCOPY  02/27/2021   Procedure: ESOPHAGOGASTRODUODENOSCOPY (EGD);  Surgeon: Midge Minium, MD;  Location: [facility] ENDOSCOPY;  Service: Endoscopy;;   IVC FILTER INSERTION N/A 10/02/2022   Procedure: IVC FILTER INSERTION;  Surgeon: Annice Needy, MD;  Location: [facility] INVASIVE CV LAB;  Service: Cardiovascular;  Laterality: N/A;   PULMONARY THROMBECTOMY N/A 10/02/2022   Procedure: PULMONARY THROMBECTOMY;  Surgeon: Annice Needy, MD;  Location: [facility] INVASIVE CV LAB;  Service: Cardiovascular;  Laterality: N/A;   RIGHT/LEFT HEART CATH AND CORONARY ANGIOGRAPHY Bilateral 05/26/2019   Procedure: RIGHT/LEFT HEART CATH AND CORONARY ANGIOGRAPHY;  Surgeon: Yvonne Kendall, MD;  Location: [facility] INVASIVE CV LAB;  Service: Cardiovascular;  Laterality: Bilateral;   TUBAL LIGATION         Recent HPI and Hospital Course  HPI  from office visit on 10/01/22 with Lauren PA-C her PCP: Pt is here for a sick visit with her daughter -Started having chest tightness and SOB yesterday -woke up short winded and went to work and took it easy, but SOB continued -No congestion, coughing, wheezing. Oxygen is down at 92% which is low for her -BP 156/90 on recheck -did wake up sweating past 2 nights -no anxiety or panic attack, does not feel stressed -right calf pain and pt reports that my hands on exam seemed very cold to her, but were not very cold to her daughter in the room. Does report having knee pain last week and had xrays and was given a cortisone shot in right knee, otherwise no changes -Bp has been very well controlled last visit and now is running high with SOB and pain -Advised to go to ED due to concern for possible blot clot vs progression of hiatal hernia causing her SOB and CP  Hospital Course: Shelley [name] is a 62 y.o. female  w/ PMH HTN, pulmonary hypertension, Anxiety/Depression, GERD, seasonal allergies, arthritis. She presents from home/doctor's office to the ED on 10/01/22  for evaluation of 2 days of worsening shortness of breath intermittent chest pain and had an episode yesterday where she woke up from sleep and was diaphoretic with discomfort.  Has been having some swelling in her lower leg as well.  Does wear CPAP at home.  Has not had any cough or major congestion.  No fevers. Reports R leg pain few weeks, has been following w/ ortho and less mobile than usual.  07/25: admitted for PE. Tx heparin gtt, vascular consult  07/26: thrombectomy this morning. Echo no concerns. [place] negative for DVT LE. Remains on heparin gtt. PT/OT to assess.  07/27: d/c heparin this afternoon and start Eliquis. PT recs for HH, TOC to work on this, should have everything in place by tomorrow and plan for discharge tomorrow relatively early AM 07/28: DME and HH confirmed, pt has no complaints/concerns, stable for discharge         Consultants:  Vascular surgery    Procedures: 10/02/22 thrombectomy w/ Dr Wyn Quaker  "Thrombolysis with 8 mg of tPA, 4 mg in each of the main pulmonary arteries. Mechanical thrombectomy using the penumbra CAT 12 catheter to the right upper lobe, right middle lobe, and right lower lobe pulmonary arteries as well as the left lower lobe and left upper lobe pulmonary arteries      Post Hospital Acute Care Issue to be followed in the Clinic   Principal Problem:   Acute saddle pulmonary embolism (HCC) Active Problems:   Hypertension   Acquired hypothyroidism   Seasonal allergies   Cardiomegaly   Pulmonary hypertension, primary (HCC)   Shortness of breath   OSA (obstructive sleep apnea)   Gastroesophageal reflux disease without esophagitis   Anxiety and depression   Pulmonary embolism (HCC)   Subjective:   [name] today has, No headache, No chest pain, No abdominal pain - No Nausea, No new weakness tingling or numbness, No Cough. Residual SOB with exertion. Able to complete her ADLs but takes it slow and takes breaks.   Assessment & Plan   1. Hospital discharge follow-up Repeat labs ordered  - CBC with Differential/Platelet - Basic metabolic panel  2. Acute saddle pulmonary embolism, unspecified whether acute cor pulmonale present (HCC) Repeat labs ordered, continue eliquis as ordered, follow up with vascular surgery - CBC with Differential/Platelet - Basic metabolic panel  3. Primary generalized (osteo)arthritis Meloxicam discontinued due to patient being on blood thinner now. Tramadol prescribed for chronic arthritic pain.  - traMADol (ULTRAM) 50 MG tablet; Take 1 tablet (50 mg total) by mouth every 6 (six) hours as needed for up to 5 days for moderate pain or severe pain.  Dispense: 20 tablet; Refill: 0  4. Right groin wound, initial encounter Mupirocin ointment prescribed to apply to insertion site wound of right groin until healed.  - mupirocin ointment (BACTROBAN)  2 %; Apply 1 Application topically 2 (two) times daily. To affected area until healed.  Dispense: 30 g; Refill: 2    Reason for frequent admissions/ER visits    pulmonary embolism  Cardiomegaly Pulmonary hypertension   Objective:   Vitals:   10/12/22 1537  BP: 138/88  Pulse: 64  Resp: 16  Temp: 98.3 F (36.8 C)  SpO2: 98%  Weight: 288 lb 6.4 oz (130.8 kg)  Height: 5\' 6"  (1.676 m)    Wt Readings from Last 3 Encounters:  10/12/22 288 lb 6.4  oz (130.8 kg)  10/02/22 293 lb (132.9 kg)  10/01/22 293 lb 12.8 oz (133.3 kg)    Allergies as of 10/12/2022       Reactions   Amlodipine         Medication List        Accurate as of October 12, 2022 11:59 PM. If you have any questions, ask your nurse or doctor.          ALPRAZolam 0.25 MG tablet Commonly known as: XANAX Take half tab twice a day as needed for severe panic attacks   apixaban 5 MG Tabs tablet Commonly known as: ELIQUIS Take 2 tablets (10 mg total) by mouth 2 (two) times daily for 6 days, THEN 1 tablet (5 mg total) 2 (two) times daily. Start taking on: October 04, 2022   diltiazem 180 MG 24 hr capsule Commonly known as: CARDIZEM CD TAKE 1 CAPSULE BY MOUTH DAILY   escitalopram 10 MG tablet Commonly known as: LEXAPRO TAKE 1 TABLET BY MOUTH ONCE DAILY WITH  SUPPER  FOR  PANIC  ATTACKS   famotidine 10 MG tablet Commonly known as: PEPCID Take 1 tablet (10 mg total) by mouth daily.   fluticasone 50 MCG/ACT nasal spray Commonly known as: FLONASE Place 1 spray into both nostrils daily.   loratadine 10 MG tablet Commonly known as: CLARITIN Take 10 mg by mouth daily.   multivitamin tablet Take 1 tablet by mouth daily.   mupirocin ointment 2 % Commonly known as: BACTROBAN Apply 1 Application topically 2 (two) times daily. To affected area until healed. Started by: Sallyanne Kuster   omeprazole 40 MG capsule Commonly known as: PRILOSEC TAKE 1 CAPSULE BY MOUTH DAILY FOR HEARTBURN   oxyCODONE 5 MG  immediate release tablet Commonly known as: Oxy IR/ROXICODONE Take 0.5-1 tablets (2.5-5 mg total) by mouth every 6 (six) hours as needed for moderate pain or severe pain.   traMADol 50 MG tablet Commonly known as: ULTRAM Take 1 tablet (50 mg total) by mouth every 6 (six) hours as needed for up to 5 days for moderate pain or severe pain. Started by: Sallyanne Kuster         Physical Exam: Constitutional: Patient appears well-developed and well-nourished. Not in obvious distress. HENT: Normocephalic, atraumatic, External right and left ear normal. Oropharynx is clear and moist.  Eyes: Conjunctivae and EOM are normal. PERRLA, no scleral icterus. Neck: Normal ROM. Neck supple. No JVD. No tracheal deviation. No thyromegaly. CVS: RRR, S1/S2 +, no murmurs, no gallops, no carotid bruit.  Pulmonary: Effort and breath sounds normal, no stridor, rhonchi, wheezes, rales. SOB with exertion Abdominal: Soft. BS +, no distension, tenderness, rebound or guarding.  Musculoskeletal: Normal range of motion. No edema and no tenderness.  Lymphadenopathy: No lymphadenopathy noted, cervical, inguinal or axillary Neuro: Alert. Normal reflexes, muscle tone coordination. No cranial nerve deficit. Skin: Skin is warm and dry. No rash noted. Not diaphoretic. No erythema. No pallor. Psychiatric: Normal mood and affect. Behavior, judgment, thought content normal.   Data Review   Micro Results No results found for this or any previous visit (from the past 240 hour(s)).   CBC No results for input(s): "WBC", "HGB", "HCT", "PLT", "MCV", "MCH", "MCHC", "RDW", "LYMPHSABS", "MONOABS", "EOSABS", "BASOSABS", "BANDABS" in the last 168 hours.  Invalid input(s): "NEUTRABS", "BANDSABD"  Chemistries  No results for input(s): "NA", "K", "CL", "CO2", "GLUCOSE", "BUN", "CREATININE", "CALCIUM", "MG", "AST", "ALT", "ALKPHOS", "BILITOT" in the last 168 hours.  Invalid input(s):  "GFRCGP" ------------------------------------------------------------------------------------------------------------------ estimated creatinine  clearance is 101.2 mL/min (by C-G formula based on SCr of 0.77 mg/dL). ------------------------------------------------------------------------------------------------------------------ No results for input(s): "HGBA1C" in the last 72 hours. ------------------------------------------------------------------------------------------------------------------ No results for input(s): "CHOL", "HDL", "LDLCALC", "TRIG", "CHOLHDL", "LDLDIRECT" in the last 72 hours. ------------------------------------------------------------------------------------------------------------------ No results for input(s): "TSH", "T4TOTAL", "T3FREE", "THYROIDAB" in the last 72 hours.  Invalid input(s): "FREET3" ------------------------------------------------------------------------------------------------------------------ No results for input(s): "VITAMINB12", "FOLATE", "FERRITIN", "TIBC", "IRON", "RETICCTPCT" in the last 72 hours.  Coagulation profile No results for input(s): "INR", "PROTIME" in the last 168 hours.  No results for input(s): "DDIMER" in the last 72 hours.  Cardiac Enzymes No results for input(s): "CKMB", "TROPONINI", "MYOGLOBIN" in the last 168 hours.  Invalid input(s): "CK" ------------------------------------------------------------------------------------------------------------------ Invalid input(s): "POCBNP"  Return for previously scheduled, F/U with Hamilton Capri PCP in september..   Time Spent in minutes  45 Time spent with patient included reviewing progress notes, labs, imaging studies, and discussing plan for follow up.   This patient was seen by Sallyanne Kuster, FNP-C in collaboration with Dr. Beverely Risen as a part of collaborative care agreement.   Sallyanne Kuster MSN, FNP-C on 10/12/2022 at 3:56 PM   **Disclaimer: This note may have been  dictated with voice recognition software. Similar sounding words can inadvertently be transcribed and this note may contain transcription errors which may not have been corrected upon publication of note.**

## 2022-10-13 ENCOUNTER — Encounter: Payer: Self-pay | Admitting: Nurse Practitioner

## 2022-10-13 ENCOUNTER — Telehealth: Payer: Self-pay | Admitting: Nurse Practitioner

## 2022-10-13 NOTE — Telephone Encounter (Signed)
Patient called asking if FMLA paperwork she brought yesterday had been completed. I explained to her it will take a few days since she just brought yesterday and we will call her when completed-Toni

## 2022-10-15 ENCOUNTER — Other Ambulatory Visit: Payer: Self-pay | Admitting: Physician Assistant

## 2022-10-15 ENCOUNTER — Telehealth: Payer: Self-pay | Admitting: Nurse Practitioner

## 2022-10-15 NOTE — Telephone Encounter (Addendum)
Disability form completed. Faxed to [employer]; [phone]. Notified patient. She will pick up @ front desk. Scanned-Toni

## 2022-10-16 ENCOUNTER — Ambulatory Visit (INDEPENDENT_AMBULATORY_CARE_PROVIDER_SITE_OTHER): Payer: BLUE CROSS/BLUE SHIELD | Admitting: Nurse Practitioner

## 2022-10-16 ENCOUNTER — Inpatient Hospital Stay: Payer: BLUE CROSS/BLUE SHIELD | Admitting: Nurse Practitioner

## 2022-10-16 DIAGNOSIS — I2699 Other pulmonary embolism without acute cor pulmonale: Secondary | ICD-10-CM

## 2022-10-16 DIAGNOSIS — I10 Essential (primary) hypertension: Secondary | ICD-10-CM | POA: Diagnosis not present

## 2022-10-17 ENCOUNTER — Encounter (INDEPENDENT_AMBULATORY_CARE_PROVIDER_SITE_OTHER): Payer: Self-pay | Admitting: Nurse Practitioner

## 2022-10-17 NOTE — Progress Notes (Signed)
Subjective:    Patient ID: Shelley [name], female    DOB: May 16, 1960, 62 y.o.   MRN: [id number] Chief Complaint  Patient presents with   Routine Post Op    Shelley [name] is a 62 year old female who presents today for evaluation post pulmonary thrombectomy on 10/02/2022.  The patient initially presented due to chest pain and shortness of breath.  This occurred [place] the [place].  She was found to have bilateral pulmonary embolism and she underwent thrombectomy.  This included:  Procedure(s) Performed:             1.  Contrast injection right heart             2.  Thrombolysis with 8 mg of tPA, 4 mg in each of the main pulmonary arteries             3.  Mechanical thrombectomy using the penumbra CAT 12 catheter to the right upper lobe, right middle lobe, and right lower lobe pulmonary arteries as well as the left lower lobe and left upper lobe pulmonary arteries             4.  Selective catheter placement right lower lobe, middle lobe, and upper lobe pulmonary arteries             5.  Selective catheter placement left upper lobe and lower lobe pulmonary arteries   She is negative for bilateral DVT.  She notes that today her breathing is much improved.  She has no chest pain or shortness of breath.  Her concern is due to the insertion site there is a small wound in the groin area.  It is superficial in nature with no evidence of infection or foul smelling drainage.    Review of Systems  Respiratory:  Negative for chest tightness and shortness of breath.   Cardiovascular:  Negative for leg swelling.  Skin:  Positive for wound.  All other systems reviewed and are negative.      Objective:   Physical Exam Vitals reviewed.  HENT:     Head: Normocephalic.  Cardiovascular:     Rate and Rhythm: Normal rate.  Pulmonary:     Effort: Pulmonary effort is normal.  Skin:    General: Skin is warm and dry.  Neurological:     Mental Status: She is alert and oriented to  person, place, and time. Mental status is at baseline.  Psychiatric:        Mood and Affect: Mood normal.        Behavior: Behavior normal.        Thought Content: Thought content normal.        Judgment: Judgment normal.     BP 133/84 (BP Location: Right Arm)   Pulse 73   Resp 18   Ht 5\' 6"  (1.676 m)   Wt 288 lb (130.6 kg)   LMP  (LMP Unknown)   BMI 46.48 kg/m   Past Medical History:  Diagnosis Date   Anemia    Anxiety    Arthritis    Cardiomegaly    Family history of breast cancer    Family history of ovarian cancer    5/22 cancer genetic testing letter sent   Hypertension    Hyperthyroidism    Pulmonary hypertension (HCC)    Sleep apnea    Thyroid disease    Vertigo     Social History   Socioeconomic History   Marital status: Single  Spouse name: Not on file   Number of children: Not on file   Years of education: Not on file   Highest education level: Not on file  Occupational History   Not on file  Tobacco Use   Smoking status: Never    Passive exposure: Never   Smokeless tobacco: Never  Vaping Use   Vaping status: Never Used  Substance and Sexual Activity   Alcohol use: No   Drug use: No   Sexual activity: Yes    Birth control/protection: I.U.D.  Other Topics Concern   Not on file  Social History Narrative   Not on file   Social Determinants of Health   Financial Resource Strain: Not on file  Food Insecurity: No Food Insecurity (10/01/2022)   Hunger Vital Sign    Worried About Running Out of Food in the Last Year: Never true    Ran Out of Food in the Last Year: Never true  Transportation Needs: No Transportation Needs (10/01/2022)   PRAPARE - Administrator, Civil Service (Medical): No    Lack of Transportation (Non-Medical): No  Physical Activity: Not on file  Stress: Not on file  Social Connections: Not on file  Intimate Partner Violence: Patient Unable To Answer (10/01/2022)   Humiliation, Afraid, Rape, and Kick  questionnaire    Fear of Current or Ex-Partner: Patient unable to answer    Emotionally Abused: Patient unable to answer    Physically Abused: Patient unable to answer    Sexually Abused: Patient unable to answer    Past Surgical History:  Procedure Laterality Date   COLONOSCOPY WITH PROPOFOL N/A 02/27/2021   Procedure: COLONOSCOPY WITH PROPOFOL;  Surgeon: Midge Minium, MD;  Location: [facility] ENDOSCOPY;  Service: Endoscopy;  Laterality: N/A;   ESOPHAGOGASTRODUODENOSCOPY  02/27/2021   Procedure: ESOPHAGOGASTRODUODENOSCOPY (EGD);  Surgeon: Midge Minium, MD;  Location: [facility] ENDOSCOPY;  Service: Endoscopy;;   IVC FILTER INSERTION N/A 10/02/2022   Procedure: IVC FILTER INSERTION;  Surgeon: Annice Needy, MD;  Location: [facility] INVASIVE CV LAB;  Service: Cardiovascular;  Laterality: N/A;   PULMONARY THROMBECTOMY N/A 10/02/2022   Procedure: PULMONARY THROMBECTOMY;  Surgeon: Annice Needy, MD;  Location: [facility] INVASIVE CV LAB;  Service: Cardiovascular;  Laterality: N/A;   RIGHT/LEFT HEART CATH AND CORONARY ANGIOGRAPHY Bilateral 05/26/2019   Procedure: RIGHT/LEFT HEART CATH AND CORONARY ANGIOGRAPHY;  Surgeon: Yvonne Kendall, MD;  Location: [facility] INVASIVE CV LAB;  Service: Cardiovascular;  Laterality: Bilateral;   TUBAL LIGATION      Family History  Problem Relation Age of Onset   Heart attack Mother    Ovarian cancer Mother    Cancer Father    Cancer Sister    Breast cancer Maternal Grandmother    Breast cancer Cousin        1st maternal cousin   Cancer Other     Allergies  Allergen Reactions   Amlodipine        Latest Ref Rng & Units 10/14/2022    3:28 PM 10/03/2022    5:30 AM 10/02/2022    5:32 AM  CBC  WBC 3.4 - 10.8 x10E3/uL 11.5  12.1  12.8   Hemoglobin 11.1 - 15.9 g/dL 16.1  09.6  04.5   Hematocrit 34.0 - 46.6 % 39.2  37.1  39.4   Platelets 150 - 450 x10E3/uL 538  299  308       CMP     Component Value Date/Time   NA 136 10/14/2022 1528   NA 141  04/29/2012 1729   K 4.9  10/14/2022 1528   K 4.0 04/29/2012 1729   CL 102 10/14/2022 1528   CL 108 (H) 04/29/2012 1729   CO2 23 10/14/2022 1528   CO2 30 04/29/2012 1729   GLUCOSE 101 (H) 10/14/2022 1528   GLUCOSE 104 (H) 10/02/2022 0532   GLUCOSE 94 04/29/2012 1729   BUN 19 10/14/2022 1528   BUN 11 04/29/2012 1729   CREATININE 0.85 10/14/2022 1528   CREATININE 0.75 04/29/2012 1729   CALCIUM 9.7 10/14/2022 1528   CALCIUM 8.8 04/29/2012 1729   PROT 7.1 07/20/2022 0829   ALBUMIN 4.2 07/20/2022 0829   AST 16 07/20/2022 0829   ALT 18 07/20/2022 0829   ALKPHOS 71 07/20/2022 0829   BILITOT 0.3 07/20/2022 0829   EGFR 77 10/14/2022 1528   GFRNONAA >60 10/02/2022 0532   GFRNONAA >60 04/29/2012 1729     No results found.     Assessment & Plan:   1. Other acute pulmonary embolism, unspecified whether acute cor pulmonale present [facility]) The patient is much improved postintervention.  Her only issue is at her current site there is a small wound.  It appears to be very superficial at this time.  Patient is advised to clean the area with Dial soap.  She has mupirocin ointment to place on it to be changed daily.  She is also advised to use either loose underwear or go without underwear to prevent irritation from the area.  Will plan to have the patient return in 3 weeks in order to reevaluate the wound.  Given the patient's extensive thrombus it was recommended that she remain on anticoagulation for a bare minimum of 6 months but preferably a year.  2. Primary hypertension Continue antihypertensive medications as already ordered, these medications have been reviewed and there are no changes at this time.   Current Outpatient Medications on File Prior to Visit  Medication Sig Dispense Refill   ALPRAZolam (XANAX) 0.25 MG tablet Take half tab twice a day as needed for severe panic attacks 30 tablet 0   apixaban (ELIQUIS) 5 MG TABS tablet Take 2 tablets (10 mg total) by mouth 2 (two) times daily for 6 days, THEN 1 tablet  (5 mg total) 2 (two) times daily. 84 tablet 0   diltiazem (CARDIZEM CD) 180 MG 24 hr capsule TAKE 1 CAPSULE BY MOUTH DAILY 90 capsule 1   escitalopram (LEXAPRO) 10 MG tablet TAKE 1 TABLET BY MOUTH ONCE DAILY WITH  SUPPER  FOR  PANIC  ATTACKS 90 tablet 2   famotidine (PEPCID) 10 MG tablet Take 1 tablet (10 mg total) by mouth daily. 90 tablet 1   loratadine (CLARITIN) 10 MG tablet Take 10 mg by mouth daily.     mupirocin ointment (BACTROBAN) 2 % Apply 1 Application topically 2 (two) times daily. To affected area until healed. 30 g 2   omeprazole (PRILOSEC) 40 MG capsule TAKE 1 CAPSULE BY MOUTH DAILY FOR HEARTBURN 30 capsule 2   traMADol (ULTRAM) 50 MG tablet Take 1 tablet (50 mg total) by mouth every 6 (six) hours as needed for up to 5 days for moderate pain or severe pain. 20 tablet 0   Multiple Vitamin (MULTIVITAMIN) tablet Take 1 tablet by mouth daily. (Patient not taking: Reported on 10/16/2022)     oxyCODONE (OXY IR/ROXICODONE) 5 MG immediate release tablet Take 0.5-1 tablets (2.5-5 mg total) by mouth every 6 (six) hours as needed for moderate pain or severe pain. (Patient not taking: Reported  on 10/16/2022) 10 tablet 0   No current facility-administered medications on file prior to visit.    There are no Patient Instructions on file for this visit. No follow-ups on file.   Georgiana Spinner, NP

## 2022-10-19 ENCOUNTER — Other Ambulatory Visit: Payer: Self-pay

## 2022-10-19 MED ORDER — DILTIAZEM HCL ER COATED BEADS 180 MG PO CP24: 180.0000 mg | ORAL_CAPSULE | Freq: Every day | ORAL | 1 refills | Status: DC

## 2022-10-26 ENCOUNTER — Encounter: Payer: Self-pay | Admitting: Nurse Practitioner

## 2022-10-26 ENCOUNTER — Other Ambulatory Visit: Payer: Self-pay | Admitting: Physician Assistant

## 2022-10-26 ENCOUNTER — Other Ambulatory Visit: Payer: Self-pay

## 2022-10-26 ENCOUNTER — Telehealth (INDEPENDENT_AMBULATORY_CARE_PROVIDER_SITE_OTHER): Payer: BLUE CROSS/BLUE SHIELD | Admitting: Nurse Practitioner

## 2022-10-26 VITALS — Resp 16 | Ht 66.0 in | Wt 283.0 lb

## 2022-10-26 DIAGNOSIS — B9689 Other specified bacterial agents as the cause of diseases classified elsewhere: Secondary | ICD-10-CM | POA: Diagnosis not present

## 2022-10-26 DIAGNOSIS — J028 Acute pharyngitis due to other specified organisms: Secondary | ICD-10-CM

## 2022-10-26 DIAGNOSIS — K219 Gastro-esophageal reflux disease without esophagitis: Secondary | ICD-10-CM

## 2022-10-26 MED ORDER — OMEPRAZOLE 40 MG PO CPDR
DELAYED_RELEASE_CAPSULE | ORAL | 2 refills | Status: DC
Start: 2022-10-26 — End: 2022-10-28

## 2022-10-26 MED ORDER — AZITHROMYCIN 250 MG PO TABS
ORAL_TABLET | ORAL | 0 refills | Status: AC
Start: 2022-10-26 — End: 2022-10-31

## 2022-10-26 NOTE — Progress Notes (Signed)
[facility] [address] [place], [place] [address]  Internal MEDICINE  Telephone Visit  Patient Name: Shelley [name]  [id number]  [id number]  Date of Service: 10/26/2022  I connected with the patient at 1315 by telephone and verified the patients identity using two identifiers.   I discussed the limitations, risks, security and privacy concerns of performing an evaluation and management service by telephone and the availability of in person appointments. I also discussed with the patient that there may be a patient responsible charge related to the service.  The patient expressed understanding and agrees to proceed.    Chief Complaint  Patient presents with   Telephone Screen    Sore throat , runny nose. Covid test was negative.    Telephone Assessment    HPI Shelley [name] presents for a telehealth virtual visit for Onset of symptoms was this morning Reports sore throat, runny nose, nasal congestion, cough Covid test was negative.    Current Medication: Outpatient Encounter Medications as of 10/26/2022  Medication Sig   ALPRAZolam (XANAX) 0.25 MG tablet Take half tab twice a day as needed for severe panic attacks   apixaban (ELIQUIS) 5 MG TABS tablet Take 2 tablets (10 mg total) by mouth 2 (two) times daily for 6 days, THEN 1 tablet (5 mg total) 2 (two) times daily.   azithromycin (ZITHROMAX) 250 MG tablet Take 2 tablets on day 1, then 1 tablet daily on days 2 through 5   diltiazem (CARDIZEM CD) 180 MG 24 hr capsule Take 1 capsule (180 mg total) by mouth daily.   escitalopram (LEXAPRO) 10 MG tablet TAKE 1 TABLET BY MOUTH ONCE DAILY WITH  SUPPER  FOR  PANIC  ATTACKS   famotidine (PEPCID) 10 MG tablet Take 1 tablet (10 mg total) by mouth daily.   loratadine (CLARITIN) 10 MG tablet Take 10 mg by mouth daily.   Multiple Vitamin (MULTIVITAMIN) tablet Take 1 tablet by mouth daily.   mupirocin ointment (BACTROBAN) 2 % Apply 1 Application topically 2 (two) times daily. To  affected area until healed.   omeprazole (PRILOSEC) 40 MG capsule TAKE 1 CAPSULE BY MOUTH DAILY FOR HEARTBURN   oxyCODONE (OXY IR/ROXICODONE) 5 MG immediate release tablet Take 0.5-1 tablets (2.5-5 mg total) by mouth every 6 (six) hours as needed for moderate pain or severe pain.   No facility-administered encounter medications on file as of 10/26/2022.    Surgical History: Past Surgical History:  Procedure Laterality Date   COLONOSCOPY WITH PROPOFOL N/A 02/27/2021   Procedure: COLONOSCOPY WITH PROPOFOL;  Surgeon: Midge Minium, MD;  Location: [facility] ENDOSCOPY;  Service: Endoscopy;  Laterality: N/A;   ESOPHAGOGASTRODUODENOSCOPY  02/27/2021   Procedure: ESOPHAGOGASTRODUODENOSCOPY (EGD);  Surgeon: Midge Minium, MD;  Location: [facility] ENDOSCOPY;  Service: Endoscopy;;   IVC FILTER INSERTION N/A 10/02/2022   Procedure: IVC FILTER INSERTION;  Surgeon: Annice Needy, MD;  Location: [facility] INVASIVE CV LAB;  Service: Cardiovascular;  Laterality: N/A;   PULMONARY THROMBECTOMY N/A 10/02/2022   Procedure: PULMONARY THROMBECTOMY;  Surgeon: Annice Needy, MD;  Location: [facility] INVASIVE CV LAB;  Service: Cardiovascular;  Laterality: N/A;   RIGHT/LEFT HEART CATH AND CORONARY ANGIOGRAPHY Bilateral 05/26/2019   Procedure: RIGHT/LEFT HEART CATH AND CORONARY ANGIOGRAPHY;  Surgeon: Yvonne Kendall, MD;  Location: [facility] INVASIVE CV LAB;  Service: Cardiovascular;  Laterality: Bilateral;   TUBAL LIGATION      Medical History: Past Medical History:  Diagnosis Date   Anemia    Anxiety    Arthritis  Cardiomegaly    Family history of breast cancer    Family history of ovarian cancer    5/22 cancer genetic testing letter sent   Hypertension    Hyperthyroidism    Pulmonary hypertension (HCC)    Sleep apnea    Thyroid disease    Vertigo     Family History: Family History  Problem Relation Age of Onset   Heart attack Mother    Ovarian cancer Mother    Cancer Father    Cancer Sister    Breast cancer Maternal  Grandmother    Breast cancer Cousin        1st maternal cousin   Cancer Other     Social History   Socioeconomic History   Marital status: Single    Spouse name: Not on file   Number of children: Not on file   Years of education: Not on file   Highest education level: Not on file  Occupational History   Not on file  Tobacco Use   Smoking status: Never    Passive exposure: Never   Smokeless tobacco: Never  Vaping Use   Vaping status: Never Used  Substance and Sexual Activity   Alcohol use: No   Drug use: No   Sexual activity: Yes    Birth control/protection: I.U.D.  Other Topics Concern   Not on file  Social History Narrative   Not on file   Social Determinants of Health   Financial Resource Strain: Not on file  Food Insecurity: No Food Insecurity (10/01/2022)   Hunger Vital Sign    Worried About Running Out of Food in the Last Year: Never true    Ran Out of Food in the Last Year: Never true  Transportation Needs: No Transportation Needs (10/01/2022)   PRAPARE - Administrator, Civil Service (Medical): No    Lack of Transportation (Non-Medical): No  Physical Activity: Not on file  Stress: Not on file  Social Connections: Not on file  Intimate Partner Violence: Patient Unable To Answer (10/01/2022)   Humiliation, Afraid, Rape, and Kick questionnaire    Fear of Current or Ex-Partner: Patient unable to answer    Emotionally Abused: Patient unable to answer    Physically Abused: Patient unable to answer    Sexually Abused: Patient unable to answer      Review of Systems  Constitutional:  Positive for fatigue.  HENT:  Positive for congestion, postnasal drip, rhinorrhea, sinus pressure, sinus pain, sneezing and sore throat.   Respiratory:  Positive for cough. Negative for chest tightness, shortness of breath and wheezing.   Cardiovascular: Negative.  Negative for chest pain and palpitations.  Neurological:  Positive for headaches.    Vital Signs: Resp  16   Ht 5\' 6"  (1.676 m)   Wt 283 lb (128.4 kg)   LMP  (LMP Unknown)   BMI 45.68 kg/m    Observation/Objective: She is alert and oriented. No acute distress noted.    Assessment/Plan: 1. Acute bacterial pharyngitis Antibiotic prescribed. Call clinic if no improvement. - azithromycin (ZITHROMAX) 250 MG tablet; Take 2 tablets on day 1, then 1 tablet daily on days 2 through 5  Dispense: 6 tablet; Refill: 0   General Counseling: Chantia verbalizes understanding of the findings of today's phone visit and agrees with plan of treatment. I have discussed any further diagnostic evaluation that may be needed or ordered today. We also reviewed her medications today. she has been encouraged to call the office with any  questions or concerns that should arise related to todays visit.  Return if symptoms worsen or fail to improve.   No orders of the defined types were placed in this encounter.   Meds ordered this encounter  Medications   azithromycin (ZITHROMAX) 250 MG tablet    Sig: Take 2 tablets on day 1, then 1 tablet daily on days 2 through 5    Dispense:  6 tablet    Refill:  0    Time spent:10 Minutes Time spent with patient included reviewing progress notes, labs, imaging studies, and discussing plan for follow up.  [place] Controlled Substance Database was reviewed by me for overdose risk score (ORS) if appropriate.  This patient was seen by Sallyanne Kuster, FNP-C in collaboration with Dr. Beverely Risen as a part of collaborative care agreement.  Everlyn Farabaugh R. Tedd Sias, MSN, FNP-C Internal medicine

## 2022-10-27 ENCOUNTER — Other Ambulatory Visit: Payer: Self-pay | Admitting: Physician Assistant

## 2022-10-27 DIAGNOSIS — K219 Gastro-esophageal reflux disease without esophagitis: Secondary | ICD-10-CM

## 2022-10-28 ENCOUNTER — Other Ambulatory Visit: Payer: Self-pay

## 2022-10-28 DIAGNOSIS — K219 Gastro-esophageal reflux disease without esophagitis: Secondary | ICD-10-CM

## 2022-10-28 MED ORDER — OMEPRAZOLE 40 MG PO CPDR
DELAYED_RELEASE_CAPSULE | ORAL | 2 refills | Status: DC
Start: 2022-10-28 — End: 2022-11-23

## 2022-10-30 ENCOUNTER — Telehealth: Payer: Self-pay | Admitting: Nurse Practitioner

## 2022-10-30 ENCOUNTER — Telehealth: Payer: Self-pay

## 2022-10-30 ENCOUNTER — Telehealth: Payer: Self-pay | Admitting: Physician Assistant

## 2022-10-30 NOTE — Telephone Encounter (Signed)
Patient called after hours on Friday requesting tramadol refill. I explained we do not do refills over the weekend. Advised her to take otc med to help with pain until we open on Monday. I sent message to [facility]

## 2022-11-02 ENCOUNTER — Other Ambulatory Visit: Payer: Self-pay | Admitting: Physician Assistant

## 2022-11-02 MED ORDER — TRAMADOL HCL 50 MG PO TABS: 50.0000 mg | ORAL_TABLET | Freq: Every day | ORAL | 0 refills | Status: DC | PRN

## 2022-11-02 NOTE — Telephone Encounter (Signed)
Pt  advised discuss with lauren at visit

## 2022-11-02 NOTE — Telephone Encounter (Signed)
Try to call pt no answer 

## 2022-11-03 ENCOUNTER — Telehealth (INDEPENDENT_AMBULATORY_CARE_PROVIDER_SITE_OTHER): Payer: Self-pay

## 2022-11-03 ENCOUNTER — Other Ambulatory Visit (INDEPENDENT_AMBULATORY_CARE_PROVIDER_SITE_OTHER): Payer: Self-pay

## 2022-11-03 MED ORDER — APIXABAN 5 MG PO TABS: 5.0000 mg | ORAL_TABLET | Freq: Two times a day (BID) | ORAL | 11 refills | Status: DC

## 2022-11-03 NOTE — Telephone Encounter (Signed)
Patient confirmed pharmacy and was notified that refill will be sent to [facility] in [place]

## 2022-11-04 ENCOUNTER — Ambulatory Visit
Admission: RE | Admit: 2022-11-04 | Discharge: 2022-11-04 | Disposition: A | Discharge status: Home / Self Care | Payer: BLUE CROSS/BLUE SHIELD | Source: Ambulatory Visit | Attending: Physician Assistant | Admitting: Physician Assistant

## 2022-11-04 ENCOUNTER — Telehealth (INDEPENDENT_AMBULATORY_CARE_PROVIDER_SITE_OTHER): Payer: Self-pay

## 2022-11-04 DIAGNOSIS — Z1231 Encounter for screening mammogram for malignant neoplasm of breast: Secondary | ICD-10-CM | POA: Insufficient documentation

## 2022-11-04 NOTE — Telephone Encounter (Signed)
Error

## 2022-11-04 NOTE — Telephone Encounter (Signed)
Patient called asking if using tape for arthritis and a blood thinner is a problem. I advised that it may increase bruising when removing the tape.

## 2022-11-05 ENCOUNTER — Other Ambulatory Visit: Payer: Self-pay | Admitting: Physician Assistant

## 2022-11-05 DIAGNOSIS — R928 Other abnormal and inconclusive findings on diagnostic imaging of breast: Secondary | ICD-10-CM

## 2022-11-06 ENCOUNTER — Encounter (INDEPENDENT_AMBULATORY_CARE_PROVIDER_SITE_OTHER): Payer: Self-pay | Admitting: Vascular Surgery

## 2022-11-06 ENCOUNTER — Ambulatory Visit (INDEPENDENT_AMBULATORY_CARE_PROVIDER_SITE_OTHER): Payer: BLUE CROSS/BLUE SHIELD | Admitting: Vascular Surgery

## 2022-11-06 VITALS — BP 125/91 | HR 83 | Resp 16 | Wt 290.2 lb

## 2022-11-06 DIAGNOSIS — I2699 Other pulmonary embolism without acute cor pulmonale: Secondary | ICD-10-CM

## 2022-11-06 DIAGNOSIS — I10 Essential (primary) hypertension: Secondary | ICD-10-CM

## 2022-11-06 NOTE — Assessment & Plan Note (Signed)
Doing well status post pulmonary thrombectomy about a month ago.  Tolerating anticoagulation which she will stay on for 1 year.  I will make her a follow-up in about a year to discuss her long-term options for therapy.

## 2022-11-06 NOTE — Assessment & Plan Note (Signed)
blood pressure control important in reducing the progression of atherosclerotic disease. On appropriate oral medications.  

## 2022-11-06 NOTE — Progress Notes (Signed)
MRN : [id number]  Shelley [name] is a 62 y.o. (15-Dec-1960) female who presents with chief complaint of  Chief Complaint  Patient presents with   Follow-up    3 week wound check  .  History of Present Illness: Patient returns today in follow up of pulmonary embolus status post pulmonary thrombectomy.  She is here to check her wound and her toleration of anticoagulation.  Her right groin access site has completely healed.  No sign of infection or drainage.  She is breathing well and is really is at her baseline now.  She is tolerating anticoagulation.  Current Outpatient Medications  Medication Sig Dispense Refill   ALPRAZolam (XANAX) 0.25 MG tablet Take half tab twice a day as needed for severe panic attacks 30 tablet 0   apixaban (ELIQUIS) 5 MG TABS tablet Take 1 tablet (5 mg total) by mouth 2 (two) times daily. 60 tablet 11   diltiazem (CARDIZEM CD) 180 MG 24 hr capsule Take 1 capsule (180 mg total) by mouth daily. 90 capsule 1   escitalopram (LEXAPRO) 10 MG tablet TAKE 1 TABLET BY MOUTH ONCE DAILY WITH  SUPPER  FOR  PANIC  ATTACKS 90 tablet 2   famotidine (PEPCID) 10 MG tablet Take 1 tablet (10 mg total) by mouth daily. 90 tablet 1   loratadine (CLARITIN) 10 MG tablet Take 10 mg by mouth daily.     Multiple Vitamin (MULTIVITAMIN) tablet Take 1 tablet by mouth daily.     mupirocin ointment (BACTROBAN) 2 % Apply 1 Application topically 2 (two) times daily. To affected area until healed. 30 g 2   omeprazole (PRILOSEC) 40 MG capsule TAKE 1 CAPSULE BY MOUTH DAILY FOR HEARTBURN 30 capsule 2   traMADol (ULTRAM) 50 MG tablet Take 1 tablet (50 mg total) by mouth daily as needed for up to 15 days. 15 tablet 0   oxyCODONE (OXY IR/ROXICODONE) 5 MG immediate release tablet Take 0.5-1 tablets (2.5-5 mg total) by mouth every 6 (six) hours as needed for moderate pain or severe pain. (Patient not taking: Reported on 11/06/2022) 10 tablet 0   No current facility-administered medications for this  visit.    Past Medical History:  Diagnosis Date   Anemia    Anxiety    Arthritis    Cardiomegaly    Family history of breast cancer    Family history of ovarian cancer    5/22 cancer genetic testing letter sent   Hypertension    Hyperthyroidism    Pulmonary hypertension (HCC)    Sleep apnea    Thyroid disease    Vertigo     Past Surgical History:  Procedure Laterality Date   COLONOSCOPY WITH PROPOFOL N/A 02/27/2021   Procedure: COLONOSCOPY WITH PROPOFOL;  Surgeon: Midge Minium, MD;  Location: [facility] ENDOSCOPY;  Service: Endoscopy;  Laterality: N/A;   ESOPHAGOGASTRODUODENOSCOPY  02/27/2021   Procedure: ESOPHAGOGASTRODUODENOSCOPY (EGD);  Surgeon: Midge Minium, MD;  Location: [facility] ENDOSCOPY;  Service: Endoscopy;;   IVC FILTER INSERTION N/A 10/02/2022   Procedure: IVC FILTER INSERTION;  Surgeon: Annice Needy, MD;  Location: [facility] INVASIVE CV LAB;  Service: Cardiovascular;  Laterality: N/A;   PULMONARY THROMBECTOMY N/A 10/02/2022   Procedure: PULMONARY THROMBECTOMY;  Surgeon: Annice Needy, MD;  Location: [facility] INVASIVE CV LAB;  Service: Cardiovascular;  Laterality: N/A;   RIGHT/LEFT HEART CATH AND CORONARY ANGIOGRAPHY Bilateral 05/26/2019   Procedure: RIGHT/LEFT HEART CATH AND CORONARY ANGIOGRAPHY;  Surgeon: Yvonne Kendall, MD;  Location: [facility] INVASIVE CV LAB;  Service:  Cardiovascular;  Laterality: Bilateral;   TUBAL LIGATION       Social History   Tobacco Use   Smoking status: Never    Passive exposure: Never   Smokeless tobacco: Never  Vaping Use   Vaping status: Never Used  Substance Use Topics   Alcohol use: No   Drug use: No       Family History  Problem Relation Age of Onset   Heart attack Mother    Ovarian cancer Mother    Cancer Father    Cancer Sister    Breast cancer Maternal Grandmother    Breast cancer Cousin        1st maternal cousin   Cancer Other      Allergies  Allergen Reactions   Amlodipine      REVIEW OF SYSTEMS (Negative unless  checked)  Constitutional: [] Weight loss  [] Fever  [] Chills Cardiac: [] Chest pain   [] Chest pressure   [] Palpitations   [] Shortness of breath when laying flat   [] Shortness of breath at rest   [] Shortness of breath with exertion. Vascular:  [] Pain in legs with walking   [] Pain in legs at rest   [] Pain in legs when laying flat   [] Claudication   [] Pain in feet when walking  [] Pain in feet at rest  [] Pain in feet when laying flat   [x] History of DVT   [x] Phlebitis   [] Swelling in legs   [] Varicose veins   [] Non-healing ulcers Pulmonary:   [] Uses home oxygen   [] Productive cough   [] Hemoptysis   [] Wheeze  [] COPD   [] Asthma Neurologic:  [] Dizziness  [] Blackouts   [] Seizures   [] History of stroke   [] History of TIA  [] Aphasia   [] Temporary blindness   [] Dysphagia   [] Weakness or numbness in arms   [] Weakness or numbness in legs Musculoskeletal:  [x] Arthritis   [] Joint swelling   [] Joint pain   [] Low back pain Hematologic:  [] Easy bruising  [] Easy bleeding   [] Hypercoagulable state   [x] Anemic   Gastrointestinal:  [] Blood in stool   [] Vomiting blood  [] Gastroesophageal reflux/heartburn   [] Abdominal pain Genitourinary:  [] Chronic kidney disease   [] Difficult urination  [] Frequent urination  [] Burning with urination   [] Hematuria Skin:  [] Rashes   [] Ulcers   [] Wounds Psychological:  [x] History of anxiety   []  History of major depression.  Physical Examination  BP (!) 125/91 (BP Location: Right Arm)   Pulse 83   Resp 16   Wt 290 lb 3.2 oz (131.6 kg)   LMP  (LMP Unknown)   BMI 46.84 kg/m  Gen:  WD/WN, NAD Head: [place]/AT, No temporalis wasting. Ear/Nose/Throat: Hearing grossly intact, nares w/o erythema or drainage Eyes: Conjunctiva clear. Sclera non-icteric Neck: Supple.  Trachea midline Pulmonary:  Good air movement, no use of accessory muscles.  Cardiac: RRR, no JVD Vascular: Right femoral access site is well-healed without erythema or drainage. Vessel Right Left  Radial Palpable Palpable                        Musculoskeletal: M/S 5/5 throughout.  No deformity or atrophy.  Mild lower extremity edema. Neurologic: Sensation grossly intact in extremities.  Symmetrical.  Speech is fluent.  Psychiatric: Judgment intact, Mood & affect appropriate for pt's clinical situation. Dermatologic: No rashes or ulcers noted.  No cellulitis or open wounds.  Access site is well-healed      Labs Recent Results (from the past 2160 hour(s))  Basic metabolic panel  Status: Abnormal   Collection Time: 10/01/22 12:29 PM  Result Value Ref Range   Sodium 141 135 - 145 mmol/L   Potassium 3.9 3.5 - 5.1 mmol/L   Chloride 113 (H) 98 - 111 mmol/L   CO2 27 22 - 32 mmol/L   Glucose, Bld 89 70 - 99 mg/dL    Comment: Glucose reference range applies only to samples taken after fasting for at least 8 hours.   BUN 17 8 - 23 mg/dL   Creatinine, Ser 1.47 0.44 - 1.00 mg/dL   Calcium 8.8 (L) 8.9 - 10.3 mg/dL   GFR, Estimated >82 >95 mL/min    Comment: (NOTE) Calculated using the CKD-EPI Creatinine Equation (2021)    Anion gap 1 (L) 5 - 15    Comment: Performed at [facility], [address] [place], [place] [address]  CBC     Status: Abnormal   Collection Time: 10/01/22 12:29 PM  Result Value Ref Range   WBC 14.3 (H) 4.0 - 10.5 K/uL   RBC 4.88 3.87 - 5.11 MIL/uL   Hemoglobin 14.2 12.0 - 15.0 g/dL   HCT 86.5 78.4 - 69.6 %   MCV 86.7 80.0 - 100.0 fL   MCH 29.1 26.0 - 34.0 pg   MCHC 33.6 30.0 - 36.0 g/dL   RDW 29.5 28.4 - 13.2 %   Platelets 306 150 - 400 K/uL   nRBC 0.0 0.0 - 0.2 %    Comment: Performed at [facility], [address], [place], [place] [address]  Troponin I (High Sensitivity)     Status: Abnormal   Collection Time: 10/01/22 12:29 PM  Result Value Ref Range   Troponin I (High Sensitivity) 131 (HH) <18 ng/L    Comment: CRITICAL RESULT CALLED TO, READ BACK BY AND VERIFIED WITH: LISA GERLER AT 1348 ON 10/01/22 BY SS (NOTE) Elevated high sensitivity troponin  I (hsTnI) values and significant  changes across serial measurements may suggest ACS but many other  chronic and acute conditions are known to elevate hsTnI results.  Refer to the Links section for chest pain algorithms and additional  guidance. Performed at [facility], [address] [place], [place] [address]   D-dimer, quantitative     Status: Abnormal   Collection Time: 10/01/22  1:52 PM  Result Value Ref Range   D-Dimer, Quant 13.21 (H) 0.00 - 0.50 ug/mL-FEU    Comment: (NOTE) At the manufacturer cut-off value of 0.5 g/mL FEU, this assay has a negative predictive value of 95-100%.This assay is intended for use in conjunction with a clinical pretest probability (PTP) assessment model to exclude pulmonary embolism (PE) and deep venous thrombosis (DVT) in outpatients suspected of PE or DVT. Results should be correlated with clinical presentation. Performed at [facility], [address] [place], [place] [address]   APTT     Status: None   Collection Time: 10/01/22  2:21 PM  Result Value Ref Range   aPTT 27 24 - 36 seconds    Comment: Performed at [facility], [address] [place], [place] [address]  Protime-INR     Status: None   Collection Time: 10/01/22  2:21 PM  Result Value Ref Range   Prothrombin Time 13.5 11.4 - 15.2 seconds   INR 1.0 0.8 - 1.2    Comment: (NOTE) INR goal varies based on device and disease states. Performed at [facility], [address] [place], [place] [address]   Troponin I (High Sensitivity)  Status: Abnormal   Collection Time: 10/01/22  2:27 PM  Result Value Ref Range   Troponin I (High Sensitivity) 135 (HH) <18 ng/L    Comment: CRITICAL VALUE NOTED. VALUE IS CONSISTENT WITH PREVIOUSLY REPORTED/CALLED VALUE SS (NOTE) Elevated high sensitivity troponin I (hsTnI) values and significant  changes across serial measurements may suggest ACS but many other  chronic and acute conditions are known  to elevate hsTnI results.  Refer to the "Links" section for chest pain algorithms and additional  guidance. Performed at [facility], [address] [place], [place] [address]   Heparin level (unfractionated)     Status: Abnormal   Collection Time: 10/01/22 10:57 PM  Result Value Ref Range   Heparin Unfractionated 0.18 (L) 0.30 - 0.70 IU/mL    Comment: (NOTE) The clinical reportable range upper limit is being lowered to >1.10 to align with the FDA approved guidance for the current laboratory assay.  If heparin results are below expected values, and patient dosage has  been confirmed, suggest follow up testing of antithrombin III levels. Performed at [facility], [address] [place], [place] [address]   TSH     Status: None   Collection Time: 10/02/22  5:32 AM  Result Value Ref Range   TSH 2.455 0.350 - 4.500 uIU/mL    Comment: Performed by a 3rd Generation assay with a functional sensitivity of <=0.01 uIU/mL. Performed at [facility], [address] [place], [place] [address]   Basic metabolic panel     Status: Abnormal   Collection Time: 10/02/22  5:32 AM  Result Value Ref Range   Sodium 137 135 - 145 mmol/L   Potassium 3.9 3.5 - 5.1 mmol/L   Chloride 109 98 - 111 mmol/L   CO2 23 22 - 32 mmol/L   Glucose, Bld 104 (H) 70 - 99 mg/dL    Comment: Glucose reference range applies only to samples taken after fasting for at least 8 hours.   BUN 22 8 - 23 mg/dL   Creatinine, Ser 4.69 0.44 - 1.00 mg/dL   Calcium 8.6 (L) 8.9 - 10.3 mg/dL   GFR, Estimated >62 >95 mL/min    Comment: (NOTE) Calculated using the CKD-EPI Creatinine Equation (2021)    Anion gap 5 5 - 15    Comment: Performed at [facility], [address] [place], [place] [address]  CBC     Status: Abnormal   Collection Time: 10/02/22  5:32 AM  Result Value Ref Range   WBC 12.8 (H) 4.0 - 10.5 K/uL   RBC 4.63 3.87 - 5.11 MIL/uL   Hemoglobin 13.4 12.0 - 15.0 g/dL    HCT 24.4 01.0 - 27.2 %   MCV 85.1 80.0 - 100.0 fL   MCH 28.9 26.0 - 34.0 pg   MCHC 34.0 30.0 - 36.0 g/dL   RDW 53.6 64.4 - 03.4 %   Platelets 308 150 - 400 K/uL   nRBC 0.0 0.0 - 0.2 %    Comment: Performed at [facility], [address] [place], [place] [address]  Heparin level (unfractionated)     Status: None   Collection Time: 10/02/22  5:32 AM  Result Value Ref Range   Heparin Unfractionated 0.49 0.30 - 0.70 IU/mL    Comment: (NOTE) The clinical reportable range upper limit is being lowered to >1.10 to align with the FDA approved guidance for the current laboratory assay.  If heparin results are below expected values, and patient dosage has  been confirmed, suggest follow up testing of antithrombin III levels. Performed at [facility], [address] [place], [place] [address]   ECHOCARDIOGRAM COMPLETE     Status: None   Collection Time: 10/02/22 11:16 AM  Result Value Ref Range   Weight 4,688 oz   Height 66 in   BP 133/75 mmHg   S' Lateral 3.10 cm   Est EF 60 - 65%   Heparin level (unfractionated)     Status: None   Collection Time: 10/02/22  1:51 PM  Result Value Ref Range   Heparin Unfractionated 0.54 0.30 - 0.70 IU/mL    Comment: (NOTE) The clinical reportable range upper limit is being lowered to >1.10 to align with the FDA approved guidance for the current laboratory assay.  If heparin results are below expected values, and patient dosage has  been confirmed, suggest follow up testing of antithrombin III levels. Performed at [facility], [address] [place], [place] [address]   Heparin level (unfractionated)     Status: None   Collection Time: 10/02/22  8:04 PM  Result Value Ref Range   Heparin Unfractionated 0.36 0.30 - 0.70 IU/mL    Comment: (NOTE) The clinical reportable range upper limit is being lowered to >1.10 to align with the FDA approved guidance for the current laboratory assay.  If heparin results are  below expected values, and patient dosage has  been confirmed, suggest follow up testing of antithrombin III levels. Performed at [facility], [address] [place], [place] [address]   CBC     Status: Abnormal   Collection Time: 10/03/22  5:30 AM  Result Value Ref Range   WBC 12.1 (H) 4.0 - 10.5 K/uL   RBC 4.31 3.87 - 5.11 MIL/uL   Hemoglobin 12.6 12.0 - 15.0 g/dL   HCT 32.4 40.1 - 02.7 %   MCV 86.1 80.0 - 100.0 fL   MCH 29.2 26.0 - 34.0 pg   MCHC 34.0 30.0 - 36.0 g/dL   RDW 25.3 66.4 - 40.3 %   Platelets 299 150 - 400 K/uL   nRBC 0.0 0.0 - 0.2 %    Comment: Performed at [facility], [address] [place], [place] [address]  Heparin level (unfractionated)     Status: None   Collection Time: 10/03/22  5:30 AM  Result Value Ref Range   Heparin Unfractionated 0.49 0.30 - 0.70 IU/mL    Comment: (NOTE) The clinical reportable range upper limit is being lowered to >1.10 to align with the FDA approved guidance for the current laboratory assay.  If heparin results are below expected values, and patient dosage has  been confirmed, suggest follow up testing of antithrombin III levels. Performed at [facility], [address] [place], [place] [address]   CBC with Differential/Platelet     Status: Abnormal   Collection Time: 10/14/22  3:28 PM  Result Value Ref Range   WBC 11.5 (H) 3.4 - 10.8 x10E3/uL   RBC 4.60 3.77 - 5.28 x10E6/uL   Hemoglobin 13.2 11.1 - 15.9 g/dL   Hematocrit 75.6 43.3 - 46.6 %   MCV 85 79 - 97 fL   MCH 28.7 26.6 - 33.0 pg   MCHC 33.7 31.5 - 35.7 g/dL   RDW 29.5 18.8 - 41.6 %   Platelets 538 (H) 150 - 450 x10E3/uL   Neutrophils 62 Not Estab. %   Lymphs 28 Not Estab. %   Monocytes 7 Not Estab. %  Eos 1 Not Estab. %   Basos 1 Not Estab. %   Neutrophils Absolute 7.0 1.4 - 7.0 x10E3/uL   Lymphocytes Absolute 3.3 (H) 0.7 - 3.1 x10E3/uL   Monocytes Absolute 0.8 0.1 - 0.9 x10E3/uL   EOS (ABSOLUTE) 0.1 0.0 - 0.4 x10E3/uL    Basophils Absolute 0.1 0.0 - 0.2 x10E3/uL   Immature Granulocytes 1 Not Estab. %   Immature Grans (Abs) 0.2 (H) 0.0 - 0.1 x10E3/uL    Comment: (An elevated percentage of Immature Granulocytes has not been found to be clinically significant as a sole clinical predictor of disease. Does NOT include bands or blast cells.  Pregnancy associated physiological leukocytosis may also show increased immature granulocytes without clinical significance.)   Basic metabolic panel     Status: Abnormal   Collection Time: 10/14/22  3:28 PM  Result Value Ref Range   Glucose 101 (H) 70 - 99 mg/dL   BUN 19 8 - 27 mg/dL   Creatinine, Ser 1.19 0.57 - 1.00 mg/dL   eGFR 77 >14 NW/GNF/6.21   BUN/Creatinine Ratio 22 12 - 28   Sodium 136 134 - 144 mmol/L   Potassium 4.9 3.5 - 5.2 mmol/L   Chloride 102 96 - 106 mmol/L   CO2 23 20 - 29 mmol/L   Calcium 9.7 8.7 - 10.3 mg/dL    Radiology MM 3D SCREENING MAMMOGRAM BILATERAL BREAST  Result Date: 11/05/2022 CLINICAL DATA:  Screening. EXAM: DIGITAL SCREENING BILATERAL MAMMOGRAM WITH TOMOSYNTHESIS AND CAD TECHNIQUE: Bilateral screening digital craniocaudal and mediolateral oblique mammograms were obtained. Bilateral screening digital breast tomosynthesis was performed. The images were evaluated with computer-aided detection. COMPARISON:  Previous exam(s). ACR Breast Density Category c: The breasts are heterogeneously dense, which may obscure small masses. FINDINGS: In the right breast, a possible asymmetry warrants further evaluation. In the left breast, no findings suspicious for malignancy. IMPRESSION: Further evaluation is suggested for possible asymmetry in the right breast. RECOMMENDATION: Diagnostic mammogram and possibly ultrasound of the right breast. (Code:[id number]) The patient will be contacted regarding the findings, and additional imaging will be scheduled. BI-RADS CATEGORY  0: Incomplete: Need additional imaging evaluation. Electronically Signed   By: Frederico Hamman M.D.   On: 11/05/2022 16:46    Assessment/Plan  Pulmonary embolism [facility]) Doing well status post pulmonary thrombectomy about a month ago.  Tolerating anticoagulation which she will stay on for 1 year.  I will make her a follow-up in about a year to discuss her long-term options for therapy.  Hypertension blood pressure control important in reducing the progression of atherosclerotic disease. On appropriate oral medications.    Festus Barren, MD  11/06/2022 11:52 AM    This note was created with Dragon medical transcription system.  Any errors from dictation are purely unintentional

## 2022-11-11 ENCOUNTER — Telehealth: Payer: Self-pay | Admitting: Physician Assistant

## 2022-11-11 NOTE — Telephone Encounter (Signed)
Lvm notifying patient 12/23/22 appointment with Tresa Endo has been cancelled-Toni

## 2022-11-13 ENCOUNTER — Other Ambulatory Visit: Payer: Self-pay | Admitting: Physician Assistant

## 2022-11-13 ENCOUNTER — Telehealth: Payer: Self-pay

## 2022-11-13 ENCOUNTER — Ambulatory Visit
Admission: RE | Admit: 2022-11-13 | Discharge: 2022-11-13 | Disposition: A | Discharge status: Home / Self Care | Payer: 59 | Source: Ambulatory Visit | Attending: Physician Assistant | Admitting: Physician Assistant

## 2022-11-13 DIAGNOSIS — R928 Other abnormal and inconclusive findings on diagnostic imaging of breast: Secondary | ICD-10-CM | POA: Diagnosis not present

## 2022-11-13 DIAGNOSIS — M150 Primary generalized (osteo)arthritis: Secondary | ICD-10-CM

## 2022-11-13 DIAGNOSIS — N63 Unspecified lump in unspecified breast: Secondary | ICD-10-CM

## 2022-11-13 MED ORDER — TRAMADOL HCL 50 MG PO TABS
50.0000 mg | ORAL_TABLET | Freq: Two times a day (BID) | ORAL | 0 refills | Status: DC | PRN
Start: 2022-11-13 — End: 2022-11-23

## 2022-11-14 ENCOUNTER — Encounter: Payer: Self-pay | Admitting: Nurse Practitioner

## 2022-11-16 DIAGNOSIS — G4733 Obstructive sleep apnea (adult) (pediatric): Secondary | ICD-10-CM | POA: Diagnosis not present

## 2022-11-20 ENCOUNTER — Ambulatory Visit
Admission: RE | Admit: 2022-11-20 | Discharge: 2022-11-20 | Disposition: A | Discharge status: Home / Self Care | Payer: 59 | Source: Ambulatory Visit | Attending: Physician Assistant | Admitting: Physician Assistant

## 2022-11-20 DIAGNOSIS — R928 Other abnormal and inconclusive findings on diagnostic imaging of breast: Secondary | ICD-10-CM | POA: Diagnosis not present

## 2022-11-20 DIAGNOSIS — N63 Unspecified lump in unspecified breast: Secondary | ICD-10-CM | POA: Diagnosis not present

## 2022-11-20 DIAGNOSIS — N6311 Unspecified lump in the right breast, upper outer quadrant: Secondary | ICD-10-CM | POA: Diagnosis not present

## 2022-11-20 HISTORY — PX: BREAST BIOPSY: SHX20

## 2022-11-20 MED ORDER — LIDOCAINE-EPINEPHRINE 1 %-1:100000 IJ SOLN
8.0000 mL | Freq: Once | INTRAMUSCULAR | Status: AC
  Administered 2022-11-20: 8 mL
  Filled 2022-11-20: qty 8

## 2022-11-20 MED ORDER — LIDOCAINE 1 % OPTIME INJ - NO CHARGE
2.0000 mL | Freq: Once | INTRAMUSCULAR | Status: AC
  Administered 2022-11-20: 2 mL via INTRADERMAL
  Filled 2022-11-20: qty 2

## 2022-11-23 ENCOUNTER — Ambulatory Visit: Payer: 59 | Admitting: Physician Assistant

## 2022-11-23 ENCOUNTER — Encounter: Payer: Self-pay | Admitting: Physician Assistant

## 2022-11-23 VITALS — BP 132/80 | HR 83 | Temp 98.2°F | Resp 16 | Ht 66.0 in | Wt 296.2 lb

## 2022-11-23 DIAGNOSIS — M150 Primary generalized (osteo)arthritis: Secondary | ICD-10-CM

## 2022-11-23 DIAGNOSIS — K219 Gastro-esophageal reflux disease without esophagitis: Secondary | ICD-10-CM | POA: Diagnosis not present

## 2022-11-23 DIAGNOSIS — I10 Essential (primary) hypertension: Secondary | ICD-10-CM | POA: Diagnosis not present

## 2022-11-23 LAB — SURGICAL PATHOLOGY

## 2022-11-23 MED ORDER — OMEPRAZOLE 40 MG PO CPDR
DELAYED_RELEASE_CAPSULE | ORAL | 2 refills | Status: DC
Start: 2022-11-23 — End: 2023-04-22

## 2022-11-23 MED ORDER — TRAMADOL HCL 50 MG PO TABS
50.0000 mg | ORAL_TABLET | Freq: Two times a day (BID) | ORAL | 2 refills | Status: AC | PRN
Start: 2022-11-23 — End: 2023-02-21

## 2022-11-23 NOTE — Progress Notes (Signed)
[facility] [address] [place], [place] [address]  Internal MEDICINE  Office Visit Note  Patient Name: Shelley [name]  [id number]  [id number]  Date of Service: 11/23/2022  Chief Complaint  Patient presents with   Hypertension   Follow-up    HPI Pt is here for routine follow up -Arthritis in both knees is main concern. -Tylenol arthritis is not helping arthritis. Can't take NSAIDs on Eliquis now. Meloxicam had been working well previously. -Has been taking tramadol for pain now and does help but has been without it recently and pain is worse again -Will send tramadol to use prn. She will try to alternative with tylenol to help. Will also follow up with ortho who has done injections previously. 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   diltiazem (CARDIZEM CD) 180 MG 24 hr capsule Take 1 capsule (180 mg total) by mouth daily.   escitalopram (LEXAPRO) 10 MG tablet TAKE 1 TABLET BY MOUTH ONCE DAILY WITH  SUPPER  FOR  PANIC  ATTACKS   famotidine (PEPCID) 10 MG tablet Take 1 tablet (10 mg total) by mouth daily.   loratadine (CLARITIN) 10 MG tablet Take 10 mg by mouth daily.   Multiple Vitamin (MULTIVITAMIN) tablet Take 1 tablet by mouth daily.   mupirocin ointment (BACTROBAN) 2 % Apply 1 Application topically 2 (two) times daily. To affected area until healed.   [DISCONTINUED] omeprazole (PRILOSEC) 40 MG capsule TAKE 1 CAPSULE BY MOUTH DAILY FOR HEARTBURN   [DISCONTINUED] traMADol (ULTRAM) 50 MG tablet Take 1 tablet (50 mg total) by mouth every  12 (twelve) hours as needed for up to 15 days.   omeprazole (PRILOSEC) 40 MG capsule TAKE 1 CAPSULE BY MOUTH DAILY FOR HEARTBURN   traMADol (ULTRAM) 50 MG tablet Take 1 tablet (50 mg total) by mouth every 12 (twelve) hours as needed.   No facility-administered encounter medications on file as of 11/23/2022.    Surgical History: Past Surgical History:  Procedure Laterality Date   BREAST BIOPSY Right 11/20/2022   [place] bx,  marker, path pending   BREAST BIOPSY Right 11/20/2022   [place] RT BREAST BX W LOC DEV 1ST LESION IMG BX SPEC US GUIDE 11/20/2022 [facility]   COLONOSCOPY WITH PROPOFOL N/A 02/27/2021   Procedure: COLONOSCOPY WITH PROPOFOL;  Surgeon: Midge Minium, MD;  Location: [facility] ENDOSCOPY;  Service: Endoscopy;  Laterality: N/A;   ESOPHAGOGASTRODUODENOSCOPY  02/27/2021   Procedure: ESOPHAGOGASTRODUODENOSCOPY (EGD);  Surgeon: Midge Minium, MD;  Location: [facility] ENDOSCOPY;  Service: Endoscopy;;   IVC FILTER INSERTION N/A 10/02/2022   Procedure: IVC FILTER INSERTION;  Surgeon: Annice Needy, MD;  Location: [facility] INVASIVE CV LAB;  Service: Cardiovascular;  Laterality: N/A;   PULMONARY THROMBECTOMY N/A 10/02/2022   Procedure: PULMONARY THROMBECTOMY;  Surgeon: Annice Needy, MD;  Location: [facility] INVASIVE CV LAB;  Service: Cardiovascular;  Laterality: N/A;   RIGHT/LEFT HEART CATH AND CORONARY ANGIOGRAPHY Bilateral 05/26/2019   Procedure: RIGHT/LEFT HEART CATH AND CORONARY ANGIOGRAPHY;  Surgeon: Yvonne Kendall, MD;  Location: [facility] INVASIVE CV LAB;  Service: Cardiovascular;  Laterality: Bilateral;  TUBAL LIGATION      Medical History: Past Medical History:  Diagnosis Date   Anemia    Anxiety    Arthritis    Cardiomegaly    Family history of breast cancer    Family history of ovarian cancer    5/22 cancer genetic testing letter sent   Hypertension    Hyperthyroidism    Pulmonary hypertension (HCC)    Sleep apnea    Thyroid disease    Vertigo     Family History: Family History   Problem Relation Age of Onset   Heart attack Mother    Ovarian cancer Mother    Cancer Father    Cancer Sister    Breast cancer Maternal Grandmother    Breast cancer Cousin        1st maternal cousin   Cancer Other     Social History   Socioeconomic History   Marital status: Single    Spouse name: Not on file   Number of children: Not on file   Years of education: Not on file   Highest education level: Not on file  Occupational History   Not on file  Tobacco Use   Smoking status: Never    Passive exposure: Never   Smokeless tobacco: Never  Vaping Use   Vaping status: Never Used  Substance and Sexual Activity   Alcohol use: No   Drug use: No   Sexual activity: Yes    Birth control/protection: I.U.D.  Other Topics Concern   Not on file  Social History Narrative   Not on file   Social Determinants of Health   Financial Resource Strain: Not on file  Food Insecurity: No Food Insecurity (10/01/2022)   Hunger Vital Sign    Worried About Running Out of Food in the Last Year: Never true    Ran Out of Food in the Last Year: Never true  Transportation Needs: No Transportation Needs (10/01/2022)   PRAPARE - Administrator, Civil Service (Medical): No    Lack of Transportation (Non-Medical): No  Physical Activity: Not on file  Stress: Not on file  Social Connections: Not on file  Intimate Partner Violence: Patient Unable To Answer (10/01/2022)   Humiliation, Afraid, Rape, and Kick questionnaire    Fear of Current or Ex-Partner: Patient unable to answer    Emotionally Abused: Patient unable to answer    Physically Abused: Patient unable to answer    Sexually Abused: Patient unable to answer      Review of Systems  Constitutional:  Negative for chills, fatigue and unexpected weight change.  HENT: Negative.  Negative for congestion, rhinorrhea, sneezing and sore throat.   Eyes:  Negative for redness.  Respiratory: Negative.  Negative for cough and chest  tightness.   Cardiovascular: Negative.  Negative for chest pain and palpitations.  Gastrointestinal:  Negative for abdominal pain, constipation, diarrhea, nausea and vomiting.  Genitourinary:  Negative for dysuria and frequency.  Musculoskeletal:  Positive for arthralgias. Negative for back pain, joint swelling and neck pain.  Skin:  Negative for rash.  Neurological: Negative.  Negative for tremors and numbness.  Hematological:  Negative for adenopathy. Does not bruise/bleed easily.  Psychiatric/Behavioral:  Negative for behavioral problems (Depression), sleep disturbance and suicidal ideas. The patient is not nervous/anxious.     Vital Signs: BP 132/80 Comment: 140/84  Pulse 83   Temp 98.2 F (36.8 C)   Resp 16   Ht 5\' 6"  (1.676 m)   Wt 296  lb 3.2 oz (134.4 kg)   LMP  (LMP Unknown)   SpO2 98%   BMI 47.81 kg/m    Physical Exam Vitals and nursing note reviewed.  Constitutional:      General: She is not in acute distress.    Appearance: Normal appearance. She is obese. She is not ill-appearing.  HENT:     Head: Normocephalic and atraumatic.  Eyes:     Pupils: Pupils are equal, round, and reactive to light.  Cardiovascular:     Rate and Rhythm: Normal rate and regular rhythm.  Pulmonary:     Effort: Pulmonary effort is normal. No respiratory distress.  Musculoskeletal:        General: Normal range of motion.  Skin:    General: Skin is warm and dry.  Neurological:     Mental Status: She is alert and oriented to person, place, and time.  Psychiatric:        Mood and Affect: Mood normal.        Behavior: Behavior normal.        Assessment/Plan: 1. Essential hypertension Stable, continue current medication  2. Primary generalized (osteo)arthritis May use tramadol as needed, advised to follow up with ortho - traMADol (ULTRAM) 50 MG tablet; Take 1 tablet (50 mg total) by mouth every 12 (twelve) hours as needed.  Dispense: 60 tablet; Refill: 2  3. Gastroesophageal  reflux disease without esophagitis - omeprazole (PRILOSEC) 40 MG capsule; TAKE 1 CAPSULE BY MOUTH DAILY FOR HEARTBURN  Dispense: 30 capsule; Refill: 2   General Counseling: Shelley [name] verbalizes understanding of the findings of todays visit and agrees with plan of treatment. I have discussed any further diagnostic evaluation that may be needed or ordered today. We also reviewed her medications today. she has been encouraged to call the office with any questions or concerns that should arise related to todays visit.    No orders of the defined types were placed in this encounter.   Meds ordered this encounter  Medications   traMADol (ULTRAM) 50 MG tablet    Sig: Take 1 tablet (50 mg total) by mouth every 12 (twelve) hours as needed.    Dispense:  60 tablet    Refill:  2   omeprazole (PRILOSEC) 40 MG capsule    Sig: TAKE 1 CAPSULE BY MOUTH DAILY FOR HEARTBURN    Dispense:  30 capsule    Refill:  2    This patient was seen by Lynn Ito, PA-C in collaboration with Dr. Beverely Risen as a part of collaborative care agreement.   Total time spent:30 Minutes Time spent includes review of chart, medications, test results, and follow up plan with the patient.      Dr Lyndon Code Internal medicine

## 2022-11-24 NOTE — Telephone Encounter (Signed)
Patient was seen in office yesterday

## 2022-11-30 ENCOUNTER — Ambulatory Visit: Payer: 59 | Admitting: Physician Assistant

## 2022-12-02 ENCOUNTER — Telehealth: Payer: Self-pay | Admitting: Internal Medicine

## 2022-12-02 NOTE — Telephone Encounter (Signed)
Left vm and sent mychart message to confirm 12/09/22 appointment-Toni

## 2022-12-07 ENCOUNTER — Telehealth: Payer: Self-pay

## 2022-12-07 NOTE — Telephone Encounter (Signed)
Completed P.A. for patient's Tramadol. 

## 2022-12-09 ENCOUNTER — Ambulatory Visit (INDEPENDENT_AMBULATORY_CARE_PROVIDER_SITE_OTHER): Payer: 59 | Admitting: Internal Medicine

## 2022-12-09 DIAGNOSIS — R0602 Shortness of breath: Secondary | ICD-10-CM

## 2022-12-13 DIAGNOSIS — I270 Primary pulmonary hypertension: Secondary | ICD-10-CM | POA: Diagnosis not present

## 2022-12-23 ENCOUNTER — Ambulatory Visit: Payer: 59

## 2022-12-23 ENCOUNTER — Encounter: Payer: 59 | Admitting: Internal Medicine

## 2022-12-28 ENCOUNTER — Ambulatory Visit: Payer: 59 | Admitting: Nurse Practitioner

## 2022-12-28 ENCOUNTER — Telehealth: Payer: Self-pay | Admitting: Physician Assistant

## 2022-12-28 ENCOUNTER — Encounter: Payer: Self-pay | Admitting: Nurse Practitioner

## 2022-12-28 VITALS — BP 148/80 | HR 76 | Temp 98.2°F | Resp 16 | Ht 66.0 in | Wt 296.6 lb

## 2022-12-28 DIAGNOSIS — G4733 Obstructive sleep apnea (adult) (pediatric): Secondary | ICD-10-CM | POA: Diagnosis not present

## 2022-12-28 DIAGNOSIS — G4734 Idiopathic sleep related nonobstructive alveolar hypoventilation: Secondary | ICD-10-CM | POA: Diagnosis not present

## 2022-12-28 NOTE — Telephone Encounter (Signed)
Notified [facility] of overnight pulseox order-Toni

## 2022-12-28 NOTE — Progress Notes (Signed)
[facility] [address] [place], [place] [address]  Internal MEDICINE  Office Visit Note  Patient Name: Shelley [name]  [id number]  [id number]  Date of Service: 12/28/2022  Chief Complaint  Patient presents with   Follow-up    Review pft    HPI Shelley [name] presents for a follow-up visit for OSA, nocturnal hypoxia and PFT results.  OSA -- on CPAP. Uses the CPAP every night, wakes up feeling rested. Not having any issues.  Nocturnal hypoxia -- need to have patient do overnight pulse oximetry while on CPAP. Patient wants to come off oxygen and wants to do this test to see if she does not need the oxygen anymore.  PFT was within normal limits. No signs of emphysema or scarring on prior imaging.    Current Medication: Outpatient Encounter Medications as of 12/28/2022  Medication Sig   ALPRAZolam (XANAX) 0.25 MG tablet Take half tab twice a day as needed for severe panic attacks   apixaban (ELIQUIS) 5 MG TABS tablet Take 1 tablet (5 mg total) by mouth 2 (two) times daily.   diltiazem (CARDIZEM CD) 180 MG 24 hr capsule Take 1 capsule (180 mg total) by mouth daily.   escitalopram (LEXAPRO) 10 MG tablet TAKE 1 TABLET BY MOUTH ONCE DAILY WITH  SUPPER  FOR  PANIC  ATTACKS   famotidine (PEPCID) 10 MG tablet Take 1 tablet (10 mg total) by mouth daily.   loratadine (CLARITIN) 10 MG tablet Take 10 mg by mouth daily.   Multiple Vitamin (MULTIVITAMIN) tablet Take 1 tablet by mouth daily.   mupirocin ointment (BACTROBAN) 2 % Apply 1 Application topically 2 (two) times daily. To affected area until healed.   omeprazole (PRILOSEC) 40 MG capsule TAKE 1 CAPSULE BY MOUTH DAILY FOR HEARTBURN   traMADol (ULTRAM) 50 MG tablet Take 1 tablet (50 mg total) by mouth every 12 (twelve) hours as needed.   No facility-administered encounter medications on file as of 12/28/2022.    Surgical History: Past Surgical History:  Procedure Laterality Date   BREAST BIOPSY Right 11/20/2022   [place] bx,   marker, path pending   BREAST BIOPSY Right 11/20/2022   [place] RT BREAST BX W LOC DEV 1ST LESION IMG BX SPEC US GUIDE 11/20/2022 [facility]   COLONOSCOPY WITH PROPOFOL N/A 02/27/2021   Procedure: COLONOSCOPY WITH PROPOFOL;  Surgeon: Midge Minium, MD;  Location[facility] ENDOSCOPY;  Service: Endoscopy;  Laterality: N/A;   ESOPHAGOGASTRODUODENOSCOPY  02/27/2021   Procedure: ESOPHAGOGASTRODUODENOSCOPY (EGD);  Surgeon: Midge Minium, MD;  Location: [facility] ENDOSCOPY;  Service: Endoscopy;;   IVC FILTER INSERTION N/A 10/02/2022   Procedure: IVC FILTER INSERTION;  Surgeon: Annice Needy, MD;  Location: [facility] INVASIVE CV LAB;  Service: Cardiovascular;  Laterality: N/A;   PULMONARY THROMBECTOMY N/A 10/02/2022   Procedure: PULMONARY THROMBECTOMY;  Surgeon: Annice Needy, MD;  Location: [facility] INVASIVE CV LAB;  Service: Cardiovascular;  Laterality: N/A;   RIGHT/LEFT HEART CATH AND CORONARY ANGIOGRAPHY Bilateral 05/26/2019   Procedure: RIGHT/LEFT HEART CATH AND CORONARY ANGIOGRAPHY;  Surgeon: Yvonne Kendall, MD;  Location: [facility] INVASIVE CV LAB;  Service: Cardiovascular;  Laterality: Bilateral;   TUBAL LIGATION      Medical History: Past Medical History:  Diagnosis Date   Anemia    Anxiety    Arthritis    Cardiomegaly    Family history of breast cancer    Family history of ovarian cancer    5/22 cancer genetic testing letter sent   Hypertension    Hyperthyroidism  Pulmonary hypertension (HCC)    Sleep apnea    Thyroid disease    Vertigo     Family History: Family History  Problem Relation Age of Onset   Heart attack Mother    Ovarian cancer Mother    Cancer Father    Cancer Sister    Breast cancer Maternal Grandmother    Breast cancer Cousin        1st maternal cousin   Cancer Other     Social History   Socioeconomic History   Marital status: Single    Spouse name: Not on file   Number of children: Not on file   Years of education: Not on file   Highest education level: Not on file   Occupational History   Not on file  Tobacco Use   Smoking status: Never    Passive exposure: Never   Smokeless tobacco: Never  Vaping Use   Vaping status: Never Used  Substance and Sexual Activity   Alcohol use: No   Drug use: No   Sexual activity: Yes    Birth control/protection: I.U.D.  Other Topics Concern   Not on file  Social History Narrative   Not on file   Social Determinants of Health   Financial Resource Strain: Not on file  Food Insecurity: No Food Insecurity (10/01/2022)   Hunger Vital Sign    Worried About Running Out of Food in the Last Year: Never true    Ran Out of Food in the Last Year: Never true  Transportation Needs: No Transportation Needs (10/01/2022)   PRAPARE - Administrator, Civil Service (Medical): No    Lack of Transportation (Non-Medical): No  Physical Activity: Not on file  Stress: Not on file  Social Connections: Not on file  Intimate Partner Violence: Patient Unable To Answer (10/01/2022)   Humiliation, Afraid, Rape, and Kick questionnaire    Fear of Current or Ex-Partner: Patient unable to answer    Emotionally Abused: Patient unable to answer    Physically Abused: Patient unable to answer    Sexually Abused: Patient unable to answer      Review of Systems  Constitutional:  Negative for chills, fatigue and unexpected weight change.  HENT: Negative.  Negative for congestion, rhinorrhea, sneezing and sore throat.   Eyes:  Negative for redness.  Respiratory: Negative.  Negative for cough, chest tightness and shortness of breath.   Cardiovascular: Negative.  Negative for chest pain and palpitations.  Gastrointestinal:  Negative for abdominal pain, constipation, diarrhea, nausea and vomiting.  Genitourinary:  Negative for dysuria and frequency.  Musculoskeletal:  Negative for arthralgias, back pain, joint swelling and neck pain.  Skin:  Negative for rash.  Neurological: Negative.  Negative for tremors and numbness.   Hematological:  Negative for adenopathy. Does not bruise/bleed easily.  Psychiatric/Behavioral:  Negative for behavioral problems (Depression), sleep disturbance and suicidal ideas. The patient is not nervous/anxious.     Vital Signs: BP (!) 148/80   Pulse 76   Temp 98.2 F (36.8 C)   Resp 16   Ht 5\' 6"  (1.676 m)   Wt 296 lb 9.6 oz (134.5 kg)   LMP  (LMP Unknown)   SpO2 99%   BMI 47.87 kg/m    Physical Exam Vitals reviewed.  Constitutional:      General: She is not in acute distress.    Appearance: Normal appearance. She is obese. She is not ill-appearing.  HENT:     Head: Normocephalic and atraumatic.  Eyes:     Pupils: Pupils are equal, round, and reactive to light.  Cardiovascular:     Rate and Rhythm: Normal rate and regular rhythm.  Pulmonary:     Effort: Pulmonary effort is normal. No respiratory distress.  Neurological:     Mental Status: She is oriented to person, place, and time.  Psychiatric:        Mood and Affect: Mood normal.        Behavior: Behavior normal.        Assessment/Plan: 1. OSA on CPAP Continue using CPAP as instructed. Overnight pulse oximetry study ordered to be done on CPAP and room air without supplemental oxygen - Overnight Pulse Oximetry Study; Future  2. Nocturnal hypoxia Overnight pulse oximetry study ordered.  - Overnight Pulse Oximetry Study; Future   General Counseling: Mayan verbalizes understanding of the findings of todays visit and agrees with plan of treatment. I have discussed any further diagnostic evaluation that may be needed or ordered today. We also reviewed her medications today. she has been encouraged to call the office with any questions or concerns that should arise related to todays visit.    Orders Placed This Encounter  Procedures   Overnight Pulse Oximetry Study    No orders of the defined types were placed in this encounter.   Return in about 6 months (around 06/28/2023) for F/U,  pulmonary/sleep, Jovian Lembcke.   Total time spent:30 Minutes Time spent includes review of chart, medications, test results, and follow up plan with the patient.   [place] Controlled Substance Database was reviewed by me.  This patient was seen by Sallyanne Kuster, FNP-C in collaboration with Dr. Beverely Risen as a part of collaborative care agreement.   Edwardine Deschepper R. Tedd Sias, MSN, FNP-C Internal medicine

## 2023-01-01 ENCOUNTER — Telehealth: Payer: Self-pay

## 2023-01-01 NOTE — Telephone Encounter (Signed)
Pt  advised as per alyssa her Overnight oximetry result showed continue Oxygen at night no change

## 2023-01-03 NOTE — Procedures (Signed)
[facility] [address] [place], [address]    Complete Pulmonary Function Testing Interpretation:  FINDINGS:  The forced vital capacity is normal.  FEV1 is normal.  FEV1 FVC ratio is normal.  Postbronchodilator no significant change in FEV1 is noted.  Total lung capacity is normal.  Residual volume is normal.  Residual volume total lung capacity ratio was increased.  DLCO is normal.  IMPRESSION:  This pulmonary function study is within normal limits clinical correlation is recommended.  Yevonne Pax, MD [facility] Pulmonary Critical Care Medicine Sleep Medicine

## 2023-01-04 ENCOUNTER — Telehealth: Payer: Self-pay | Admitting: Nurse Practitioner

## 2023-01-04 NOTE — Telephone Encounter (Signed)
Received overnight pulseox order back from [facility] for correct diagnosis. Gave to [employer]

## 2023-01-05 ENCOUNTER — Telehealth: Payer: Self-pay | Admitting: Physician Assistant

## 2023-01-05 NOTE — Telephone Encounter (Signed)
Overnight pulseox order faxed to [facility]; [phone]. Scanned-Toni

## 2023-01-13 DIAGNOSIS — I270 Primary pulmonary hypertension: Secondary | ICD-10-CM | POA: Diagnosis not present

## 2023-01-22 ENCOUNTER — Telehealth: Payer: Self-pay | Admitting: Nurse Practitioner

## 2023-01-22 LAB — PULMONARY FUNCTION TEST

## 2023-01-22 NOTE — Telephone Encounter (Signed)
Received request for 12/28/22 office notes from [facility]. Gave to Alyssa to close-Toni

## 2023-01-25 ENCOUNTER — Ambulatory Visit: Payer: 59 | Admitting: Surgery

## 2023-02-12 DIAGNOSIS — I270 Primary pulmonary hypertension: Secondary | ICD-10-CM | POA: Diagnosis not present

## 2023-02-14 ENCOUNTER — Encounter: Payer: Self-pay | Admitting: Nurse Practitioner

## 2023-02-14 DIAGNOSIS — G4734 Idiopathic sleep related nonobstructive alveolar hypoventilation: Secondary | ICD-10-CM | POA: Insufficient documentation

## 2023-03-15 DIAGNOSIS — I270 Primary pulmonary hypertension: Secondary | ICD-10-CM | POA: Diagnosis not present

## 2023-03-22 ENCOUNTER — Ambulatory Visit: Payer: 59 | Admitting: Surgery

## 2023-03-30 ENCOUNTER — Telehealth: Payer: Self-pay | Admitting: Physician Assistant

## 2023-03-30 NOTE — Telephone Encounter (Signed)
Per request,  12/28/22 office note faxed to [facility]; [phone]

## 2023-03-31 ENCOUNTER — Ambulatory Visit: Payer: 59 | Admitting: Surgery

## 2023-03-31 DIAGNOSIS — G4733 Obstructive sleep apnea (adult) (pediatric): Secondary | ICD-10-CM | POA: Diagnosis not present

## 2023-04-13 ENCOUNTER — Other Ambulatory Visit: Payer: Self-pay | Admitting: Physician Assistant

## 2023-04-13 MED ORDER — DILTIAZEM HCL ER COATED BEADS 180 MG PO CP24: 180.0000 mg | ORAL_CAPSULE | Freq: Every day | ORAL | 1 refills | Status: DC

## 2023-04-22 ENCOUNTER — Other Ambulatory Visit: Payer: Self-pay | Admitting: Physician Assistant

## 2023-04-22 DIAGNOSIS — K219 Gastro-esophageal reflux disease without esophagitis: Secondary | ICD-10-CM

## 2023-05-01 ENCOUNTER — Other Ambulatory Visit: Payer: Self-pay

## 2023-05-01 ENCOUNTER — Emergency Department: Payer: 59

## 2023-05-01 ENCOUNTER — Emergency Department
Admission: EM | Admit: 2023-05-01 | Discharge: 2023-05-01 | Disposition: A | Discharge status: Home / Self Care | Payer: 59 | Attending: Emergency Medicine | Admitting: Emergency Medicine

## 2023-05-01 DIAGNOSIS — Z7901 Long term (current) use of anticoagulants: Secondary | ICD-10-CM | POA: Diagnosis not present

## 2023-05-01 DIAGNOSIS — M79605 Pain in left leg: Secondary | ICD-10-CM | POA: Diagnosis present

## 2023-05-01 DIAGNOSIS — I10 Essential (primary) hypertension: Secondary | ICD-10-CM | POA: Insufficient documentation

## 2023-05-01 MED ORDER — CEPHALEXIN 500 MG PO CAPS: 500.0000 mg | ORAL_CAPSULE | Freq: Four times a day (QID) | ORAL | 0 refills | Status: AC

## 2023-05-01 NOTE — ED Triage Notes (Signed)
 Pt c/o left leg pain in her upper thigh area, was sent over from UC for [place] for possible blood clot with hx of blood clot in her right leg that turned into a PE. Pt is on eliquis

## 2023-05-01 NOTE — Discharge Instructions (Signed)
 You were seen in the ER today for evaluation of your leg pain.  Your ultrasound fortunately did not show a blood clot.  It is possible you may have an early skin infection.  I sent a prescription for antibiotics to your pharmacy.  Take these as directed.  Follow with your primary care doctor for further evaluation.  Return to the ER for new or worsening symptoms.

## 2023-05-01 NOTE — ED Provider Notes (Signed)
 [facility] Provider Note    Event Date/Time   First MD Initiated Contact with Patient 05/01/23 1840     (approximate)   History   Leg Pain (Pt c/o left leg pain in her upper thigh area, was sent over from [facility] for [place] for possible blood clot with hx of blood clot in her right leg that turned into a PE. Pt is on eliquis)   HPI  Shelley [name] is a 63 year old female with history of DVT/PE on Eliquis, HTN presenting to the emergency department for evaluation of leg pain.  On Wednesday, she began to notice some discomfort over her left upper thigh.  No fevers or chills.  Has not noted significant redness over the area.  No identifiable trauma.  She was seen in urgent care where they were concerned about possible DVT and she was directed to the ER for further evaluation.  No chest pain or shortness of breath.     Physical Exam   Triage Vital Signs: ED Triage Vitals [05/01/23 1714]  Encounter Vitals Group     BP (!) 153/81     Systolic BP Percentile      Diastolic BP Percentile      Pulse Rate 74     Resp 16     Temp 98.1 F (36.7 C)     Temp Source Oral     SpO2 97 %     Weight      Height 5\' 6"  (1.676 m)     Head Circumference      Peak Flow      Pain Score 7     Pain Loc      Pain Education      Exclude from Growth Chart     Most recent vital signs: Vitals:   05/01/23 1714  BP: (!) 153/81  Pulse: 74  Resp: 16  Temp: 98.1 F (36.7 C)  SpO2: 97%     General: Awake, interactive  CV:  Regular rate, good peripheral perfusion.  Resp:  Unlabored respirations, lungs clear to auscultation Abd:  Nondistended, soft, nontender to palpation Neuro:  Symmetric facial movement, fluid speech MSK:  There is an area of poorly defined fullness over the left anterolateral thigh with associated tenderness to palpation without fluctuance.  No significant erythema or warmth.  No drainage.   ED Results / Procedures / Treatments   Labs (all labs  ordered are listed, but only abnormal results are displayed) Labs Reviewed - No data to display   EKG EKG independently reviewed interpreted by myself (ER attending) demonstrates:    RADIOLOGY Imaging independently reviewed and interpreted by myself demonstrates:  Ultrasound without evidence of DVT, area of prominent subcutaneous fat noted along patient's area of tenderness  PROCEDURES:  Critical Care performed: No  Procedures   MEDICATIONS ORDERED IN ED: Medications - No data to display   IMPRESSION / MDM / ASSESSMENT AND PLAN / ED COURSE  I reviewed the triage vital signs and the nursing notes.  Differential diagnosis includes, but is not limited to, DVT, cellulitis, unidentified trauma  Patient's presentation is most consistent with acute presentation with potential threat to life or bodily function.  63 year old female presenting with atraumatic leg pain.  Stable vitals.  Ultrasound without evidence of DVT.  It does note an area of prominent subcutaneous fat consistent with location of patient's pain.  Does not readily have erythema or warmth in this area, but question possible early cellulitis.  I did  discuss antibiotics versus watchful waiting and outpatient follow-up.  Patient would like to proceed with antibiotics.  Will DC with prescription for Keflex.  Strict return precautions provided.  Patient discharged stable condition.      FINAL CLINICAL IMPRESSION(S) / ED DIAGNOSES   Final diagnoses:  Left leg pain     Rx / DC Orders   ED Discharge Orders          Ordered    cephALEXin (KEFLEX) 500 MG capsule  4 times daily        05/01/23 2145             Note:  This document was prepared using Dragon voice recognition software and may include unintentional dictation errors.   Trinna Post, MD 05/01/23 [phone]

## 2023-05-07 ENCOUNTER — Ambulatory Visit: Payer: 59 | Admitting: Physician Assistant

## 2023-05-07 ENCOUNTER — Emergency Department
Admission: EM | Admit: 2023-05-07 | Discharge: 2023-05-07 | Disposition: A | Discharge status: Home / Self Care | Payer: 59 | Attending: Emergency Medicine | Admitting: Emergency Medicine

## 2023-05-07 ENCOUNTER — Emergency Department: Payer: 59

## 2023-05-07 ENCOUNTER — Other Ambulatory Visit: Payer: Self-pay

## 2023-05-07 DIAGNOSIS — R109 Unspecified abdominal pain: Secondary | ICD-10-CM | POA: Diagnosis present

## 2023-05-07 DIAGNOSIS — R1032 Left lower quadrant pain: Secondary | ICD-10-CM

## 2023-05-07 DIAGNOSIS — R1084 Generalized abdominal pain: Secondary | ICD-10-CM | POA: Diagnosis not present

## 2023-05-07 DIAGNOSIS — Z7901 Long term (current) use of anticoagulants: Secondary | ICD-10-CM | POA: Diagnosis not present

## 2023-05-07 DIAGNOSIS — N838 Other noninflammatory disorders of ovary, fallopian tube and broad ligament: Secondary | ICD-10-CM

## 2023-05-07 LAB — CBC
HCT: 37.6 % (ref 36.0–46.0)
Hemoglobin: 12.6 g/dL (ref 12.0–15.0)
MCH: 29.4 pg (ref 26.0–34.0)
MCHC: 33.5 g/dL (ref 30.0–36.0)
MCV: 87.6 fL (ref 80.0–100.0)
Platelets: 397 10*3/uL (ref 150–400)
RBC: 4.29 MIL/uL (ref 3.87–5.11)
RDW: 15.2 % (ref 11.5–15.5)
WBC: 9.4 10*3/uL (ref 4.0–10.5)
nRBC: 0 % (ref 0.0–0.2)

## 2023-05-07 LAB — LIPASE, BLOOD: Lipase: 28 U/L (ref 11–51)

## 2023-05-07 LAB — COMPREHENSIVE METABOLIC PANEL
ALT: 14 U/L (ref 0–44)
AST: 17 U/L (ref 15–41)
Albumin: 3.9 g/dL (ref 3.5–5.0)
Alkaline Phosphatase: 51 U/L (ref 38–126)
Anion gap: 8 (ref 5–15)
BUN: 9 mg/dL (ref 8–23)
CO2: 25 mmol/L (ref 22–32)
Calcium: 9 mg/dL (ref 8.9–10.3)
Chloride: 106 mmol/L (ref 98–111)
Creatinine, Ser: 0.79 mg/dL (ref 0.44–1.00)
GFR, Estimated: >60 mL/min (ref >60)
Glucose, Bld: 125 mg/dL — ABNORMAL HIGH (ref 70–99)
Potassium: 3.4 mmol/L — ABNORMAL LOW (ref 3.5–5.1)
Sodium: 139 mmol/L (ref 135–145)
Total Bilirubin: 0.6 mg/dL (ref 0.0–1.2)
Total Protein: 7.4 g/dL (ref 6.5–8.1)

## 2023-05-07 MED ORDER — OXYCODONE HCL 5 MG PO TABS: 5.0000 mg | ORAL_TABLET | Freq: Four times a day (QID) | ORAL | 0 refills | Status: DC | PRN

## 2023-05-07 MED ORDER — IOHEXOL 9 MG/ML PO SOLN
500.0000 mL | ORAL | Status: AC
  Administered 2023-05-07: 500 mL via ORAL

## 2023-05-07 MED ORDER — SENNOSIDES-DOCUSATE SODIUM 8.6-50 MG PO TABS: 2.0000 | ORAL_TABLET | Freq: Two times a day (BID) | ORAL | 0 refills | Status: DC

## 2023-05-07 MED ORDER — KETOROLAC TROMETHAMINE 15 MG/ML IJ SOLN
15.0000 mg | Freq: Once | INTRAMUSCULAR | Status: AC
  Administered 2023-05-07: 15 mg via INTRAMUSCULAR
  Filled 2023-05-07: qty 1

## 2023-05-07 NOTE — Discharge Instructions (Addendum)
 Your CT scan shows a large ovarian mass.  Please make an appointment to see Gynecology to further evaluate this finding. Take oxycodone as needed for pain, and take a stool softener (Senokot) twice a day to prevent constipation.

## 2023-05-07 NOTE — ED Provider Notes (Signed)
 [facility] Provider Note    Event Date/Time   First MD Initiated Contact with Patient 05/07/23 [phone]     (approximate)   History   Abdominal Pain   HPI  Shelley [name] is a 63 y.o. female   Past medical history of prior PE on Eliquis, IVC filter placement, here with abdominal pain for 3 days.  She has a history of hiatal hernia as well, longstanding.  She denies nausea vomiting or diarrhea.  Last bowel movement yesterday.  Of note she was seen in the emergency department approximately 5 days ago and was evaluated for potential DVT given pain in the lower extremity which was negative for DVT on ultrasound but started on Keflex for concern of cellulitis.  She started taking this medication couple days ago which correlates with onset of abdominal discomfort.  It is generalized nonradiating and constant.  She has no urinary symptoms.  Independent Historian contributed to assessment above: Daughter and granddaughter at bedside to corroborate information past medical history as above  External Medical Documents Reviewed: Emergency department visit just a few days ago with leg pain got an ultrasound for DVT which was negative.  Was prescribed Keflex at that time to cover for potential cellulitis.      Physical Exam   Triage Vital Signs: ED Triage Vitals  Encounter Vitals Group     BP 05/07/23 0103 (!) 149/99     Systolic BP Percentile --      Diastolic BP Percentile --      Pulse Rate 05/07/23 0103 74     Resp 05/07/23 0103 20     Temp 05/07/23 0103 98.1 F (36.7 C)     Temp Source 05/07/23 0103 Oral     SpO2 05/07/23 0103 100 %     Weight 05/07/23 0103 280 lb (127 kg)     Height 05/07/23 0103 5\' 6"  (1.676 m)     Head Circumference --      Peak Flow --      Pain Score 05/07/23 0102 8     Pain Loc --      Pain Education --      Exclude from Growth Chart --     Most recent vital signs: Vitals:   05/07/23 0103 05/07/23 0559  BP: (!)  149/99   Pulse: 74 67  Resp: 20 19  Temp: 98.1 F (36.7 C) 97.8 F (36.6 C)  SpO2: 100% 96%    General: Awake, no distress.  CV:  Good peripheral perfusion.  Resp:  Normal effort.  Abd:  No distention.  Other:  Pleasant woman in no acute distress.  Slightly hypertensive otherwise vital signs normal.  She has epigastric and left lower quadrant tenderness to palpation without rigidity or guarding.   ED Results / Procedures / Treatments   Labs (all labs ordered are listed, but only abnormal results are displayed) Labs Reviewed  COMPREHENSIVE METABOLIC PANEL - Abnormal; Notable for the following components:      Result Value   Potassium 3.4 (*)    Glucose, Bld 125 (*)    All other components within normal limits  LIPASE, BLOOD  CBC     I ordered and reviewed the above labs they are notable for cell counts electrolytes largely unremarkable     PROCEDURES:  Critical Care performed: No  Procedures   MEDICATIONS ORDERED IN ED: Medications  iohexol (OMNIPAQUE) 9 MG/ML oral solution 500 mL (500 mLs Oral Contrast Given 05/07/23 0600)  IMPRESSION / MDM / ASSESSMENT AND PLAN / ED COURSE  I reviewed the triage vital signs and the nursing notes.                                Patient's presentation is most consistent with acute presentation with potential threat to life or bodily function.  Differential diagnosis includes, but is not limited to, adverse effect of antibiotic use recently, intra-abdominal infection, diverticulitis, appendicitis, cholecystitis, hiatal hernia   The patient is on the cardiac monitor to evaluate for evidence of arrhythmia and/or significant heart rate changes.  MDM:    Her pain onset correlates with when she started antibiotic and this is known side effect may be due to that.  However given her tenderness in the epigastrium, left lower quadrant I think prudent to check a CT scan of the abdomen pelvis for any infectious/surgical causes.   She has longstanding hiatal hernia which may contribute to her symptoms as well.  Plan be for imaging as above, and if no emergency findings plan will be for discharge home with close PMD follow-up       FINAL CLINICAL IMPRESSION(S) / ED DIAGNOSES   Final diagnoses:  Generalized abdominal pain     Rx / DC Orders   ED Discharge Orders     None        Note:  This document was prepared using Dragon voice recognition software and may include unintentional dictation errors.    Pilar Jarvis, MD 05/07/23 [phone]

## 2023-05-07 NOTE — ED Provider Notes (Signed)
 Procedures   ----------------------------------------- 9:53 AM on 05/07/2023 -----------------------------------------  CT reveals 10 x 6 cm left ovarian solid mass concerning for neoplasm.  There is also enlarged uterus with innumerable calcified fibroids.  Patient still having moderate intensity abdominal pain.  Will give analgesics.  Contacted gynecology Dr. Karie Schwalbe Schermerhorn who will evaluate.   Clinical Course as of 05/07/23 1602  Fri May 07, 2023  1114 D/w Gyn Dr. Lonny Prude - rec [place] to eval for torsion, if negative could f/u outpatient. [PS]    Clinical Course User Index [PS] Sharman Cheek, MD    ----------------------------------------- 4:02 PM on 05/07/2023 ----------------------------------------- Ultrasound limited, but not consistent with torsion.  Pain is much better, clinically presentation is not consistent with torsion.  Patient is comfortable and stable for discharge at this point.   Sharman Cheek, MD 05/07/23 [phone]

## 2023-05-07 NOTE — ED Triage Notes (Signed)
 Pt states she has had generalized abd pain x3 days. Pt denies n/v/d cough congestion fever or dysuria. Pt has hx hiatal hernia

## 2023-05-10 ENCOUNTER — Other Ambulatory Visit: Payer: Self-pay | Admitting: Physician Assistant

## 2023-05-10 DIAGNOSIS — F411 Generalized anxiety disorder: Secondary | ICD-10-CM

## 2023-05-11 ENCOUNTER — Ambulatory Visit: Admitting: Obstetrics

## 2023-05-11 ENCOUNTER — Other Ambulatory Visit: Payer: Self-pay | Admitting: Physician Assistant

## 2023-05-11 ENCOUNTER — Encounter: Payer: Self-pay | Admitting: Obstetrics

## 2023-05-11 VITALS — BP 161/73 | HR 77 | Ht 66.0 in | Wt 293.0 lb

## 2023-05-11 DIAGNOSIS — F411 Generalized anxiety disorder: Secondary | ICD-10-CM

## 2023-05-11 DIAGNOSIS — N838 Other noninflammatory disorders of ovary, fallopian tube and broad ligament: Secondary | ICD-10-CM

## 2023-05-11 DIAGNOSIS — Z8041 Family history of malignant neoplasm of ovary: Secondary | ICD-10-CM

## 2023-05-11 NOTE — Progress Notes (Signed)
 GYNECOLOGY PROGRESS NOTE  Subjective:  PCP: Carlean Jews, PA-C  Patient ID: Shelley [name], female    DOB: 1960/09/22, 63 y.o.   MRN: [id number]  HPI  Patient is a 63 y.o. [id number] female who presents for ED follow up from 05/07/23 for abd pain. She went in for upper abdominal bloating, while she was there they did a CT abd/pelvis and pelvic ultrasound and found a ovarian mass. Pt reports her mother passed away in her 35s from ovarian cancer, unsure of genetic risk. Denies lower abdominal bloating, edema. Went to another ED immediately after [facility] and they treated her for acid reflux, which alleviated her upper abdominal symptoms, she notes a hx of hiatal hernia.   Past Medical History:  Diagnosis Date   Anemia    Anxiety    Arthritis    Cardiomegaly    Family history of breast cancer    Family history of ovarian cancer    5/22 cancer genetic testing letter sent   Hypertension    Hyperthyroidism    Pulmonary hypertension (HCC)    Sleep apnea    Thyroid disease    Vertigo    Past Surgical History:  Procedure Laterality Date   BREAST BIOPSY Right 11/20/2022   [place] bx,  marker, path pending   BREAST BIOPSY Right 11/20/2022   [place] RT BREAST BX W LOC DEV 1ST LESION IMG BX SPEC US GUIDE 11/20/2022 [facility]   COLONOSCOPY WITH PROPOFOL N/A 02/27/2021   Procedure: COLONOSCOPY WITH PROPOFOL;  Surgeon: Midge Minium, MD;  Location: [facility] ENDOSCOPY;  Service: Endoscopy;  Laterality: N/A;   ESOPHAGOGASTRODUODENOSCOPY  02/27/2021   Procedure: ESOPHAGOGASTRODUODENOSCOPY (EGD);  Surgeon: Midge Minium, MD;  Location: [facility] ENDOSCOPY;  Service: Endoscopy;;   IVC FILTER INSERTION N/A 10/02/2022   Procedure: IVC FILTER INSERTION;  Surgeon: Annice Needy, MD;  Location: [facility] INVASIVE CV LAB;  Service: Cardiovascular;  Laterality: N/A;   PULMONARY THROMBECTOMY N/A 10/02/2022   Procedure: PULMONARY THROMBECTOMY;  Surgeon: Annice Needy, MD;  Location: [facility] INVASIVE CV LAB;  Service:  Cardiovascular;  Laterality: N/A;   RIGHT/LEFT HEART CATH AND CORONARY ANGIOGRAPHY Bilateral 05/26/2019   Procedure: RIGHT/LEFT HEART CATH AND CORONARY ANGIOGRAPHY;  Surgeon: Yvonne Kendall, MD;  Location: [facility] INVASIVE CV LAB;  Service: Cardiovascular;  Laterality: Bilateral;   TUBAL LIGATION     OB History     Gravida  4   Para  4   Term  4   Preterm      AB      Living  4      SAB      IAB      Ectopic      Multiple      Live Births               Family History  Problem Relation Age of Onset   Heart attack Mother    Ovarian cancer Mother    Cancer Father    Cancer Sister    Breast cancer Maternal Grandmother    Breast cancer Cousin        1st maternal cousin   Cancer Other    Social History   Socioeconomic History   Marital status: Single    Spouse name: Not on file   Number of children: Not on file   Years of education: Not on file   Highest education level: Not on file  Occupational History   Not on file  Tobacco Use   Smoking status:  Never    Passive exposure: Never   Smokeless tobacco: Never  Vaping Use   Vaping status: Never Used  Substance and Sexual Activity   Alcohol use: No   Drug use: No   Sexual activity: Not Currently    Birth control/protection: Surgical  Other Topics Concern   Not on file  Social History Narrative   Not on file   Social Drivers of Health   Financial Resource Strain: Not on file  Food Insecurity: No Food Insecurity (10/01/2022)   Hunger Vital Sign    Worried About Running Out of Food in the Last Year: Never true    Ran Out of Food in the Last Year: Never true  Transportation Needs: No Transportation Needs (10/01/2022)   PRAPARE - Administrator, Civil Service (Medical): No    Lack of Transportation (Non-Medical): No  Physical Activity: Not on file  Stress: Not on file  Social Connections: Not on file  Intimate Partner Violence: Patient Unable To Answer (10/01/2022)   Humiliation, Afraid,  Rape, and Kick questionnaire    Fear of Current or Ex-Partner: Patient unable to answer    Emotionally Abused: Patient unable to answer    Physically Abused: Patient unable to answer    Sexually Abused: Patient unable to answer   Current Outpatient Medications on File Prior to Visit  Medication Sig Dispense Refill   ALPRAZolam (XANAX) 0.25 MG tablet Take half tab twice a day as needed for severe panic attacks 30 tablet 0   apixaban (ELIQUIS) 5 MG TABS tablet Take 1 tablet (5 mg total) by mouth 2 (two) times daily. 60 tablet 11   diltiazem (CARDIZEM CD) 180 MG 24 hr capsule Take 1 capsule (180 mg total) by mouth daily. 90 capsule 1   escitalopram (LEXAPRO) 10 MG tablet TAKE 1 TABLET BY MOUTH ONCE DAILY WITH  SUPPER  FOR  PANIC  ATTACKS 90 tablet 2   famotidine (PEPCID) 10 MG tablet Take 1 tablet (10 mg total) by mouth daily. 90 tablet 1   loratadine (CLARITIN) 10 MG tablet Take 10 mg by mouth daily.     Multiple Vitamin (MULTIVITAMIN) tablet Take 1 tablet by mouth daily.     omeprazole (PRILOSEC) 40 MG capsule TAKE 1 CAPSULE BY MOUTH TWICE DAILY FOR HEARTBURN**MAX REACHED ONLY 30 CAPS ALLOWED 30 capsule 5   senna-docusate (SENOKOT-S) 8.6-50 MG tablet Take 2 tablets by mouth 2 (two) times daily. 120 tablet 0   mupirocin ointment (BACTROBAN) 2 % Apply 1 Application topically 2 (two) times daily. To affected area until healed. (Patient not taking: Reported on 05/11/2023) 30 g 2   oxyCODONE (ROXICODONE) 5 MG immediate release tablet Take 1 tablet (5 mg total) by mouth every 6 (six) hours as needed for breakthrough pain. (Patient not taking: Reported on 05/11/2023) 12 tablet 0   No current facility-administered medications on file prior to visit.   Allergies  Allergen Reactions   Amlodipine    Review of Systems Pertinent items are noted in HPI.   Objective:   Blood pressure (!) 161/73, pulse 77, height 5\' 6"  (1.676 m), weight 293 lb (132.9 kg). Body mass index is 47.29 kg/m.  General  appearance: alert and cooperative Abdomen: soft, non-tender; bowel sounds normal; no masses,  no organomegaly Pelvic: deferred Extremities: extremities normal, atraumatic, no cyanosis or edema Neurologic: Grossly normal  EXAM: CT ABDOMEN AND PELVIS WITHOUT CONTRAST Stomach/Bowel: Moderate size hiatal hernia. There is no evidence of bowel obstruction or inflammation. The appendix is  not clearly visualized.   Vascular/Lymphatic: No significant vascular findings are present. No enlarged abdominal or pelvic lymph nodes.   Reproductive: Multiple calcified uterine fibroids are noted. No definite right adnexal abnormality is noted. 10.2 x 6.1 cm lobulated solid left adnexal mass is noted concerning for ovarian neoplasm.   Other: Moderate size fat containing periumbilical hernia. No ascites.   Musculoskeletal: No acute or significant osseous findings.  EXAM: TRANSABDOMINAL AND TRANSVAGINAL ULTRASOUND OF PELVIS   IMPRESSION: 1. Markedly limited exam. 2. There is a vague, ill-defined hypoechoic approximately 7.9 x 9.3 x 11.2 cm area measured on the trans abdominal images in the left adnexa. This most likely corresponds to the soft tissue attenuation mass seen on the same day acquired CT scan. However, evaluation is markedly limited and contrast-enhanced MRI pelvis is recommended for better characterization. 3. There are ill-defined hypoattenuating areas in the uterus, which are not well characterized on this exam but statistically favored to represent leiomyomas. These can also be better characterized with contrast-enhanced MRI pelvis.  Assessment/Plan:   1. Ovarian mass, left   2. Family history of ovarian cancer   We reviewed the imaging studies together. Will order MRI for better characterization. Obtaining CA-125 today. Due to pt's mother with ovarian cancer, will obtain [facility] today as well. Discussed that due to the size of the mass, she will most likely need to have  this surgically removed, even if benign.  -Will follow up after results -RTC for consult for L ovarian mass removal    Julieanne Manson, DO [facility]

## 2023-05-12 ENCOUNTER — Encounter: Payer: Self-pay | Admitting: Obstetrics

## 2023-05-12 LAB — CA 125: Cancer Antigen (CA) 125: 26.2 U/mL (ref 0.0–38.1)

## 2023-05-14 ENCOUNTER — Encounter: Payer: Self-pay | Admitting: Physician Assistant

## 2023-05-14 ENCOUNTER — Ambulatory Visit (INDEPENDENT_AMBULATORY_CARE_PROVIDER_SITE_OTHER): Admitting: Physician Assistant

## 2023-05-14 VITALS — BP 130/90 | HR 75 | Temp 98.1°F | Resp 16 | Ht 66.0 in | Wt 294.0 lb

## 2023-05-14 DIAGNOSIS — N838 Other noninflammatory disorders of ovary, fallopian tube and broad ligament: Secondary | ICD-10-CM

## 2023-05-14 DIAGNOSIS — K449 Diaphragmatic hernia without obstruction or gangrene: Secondary | ICD-10-CM

## 2023-05-14 DIAGNOSIS — F411 Generalized anxiety disorder: Secondary | ICD-10-CM

## 2023-05-14 DIAGNOSIS — K219 Gastro-esophageal reflux disease without esophagitis: Secondary | ICD-10-CM

## 2023-05-14 MED ORDER — ESCITALOPRAM OXALATE 10 MG PO TABS: ORAL_TABLET | ORAL | 2 refills | Status: AC

## 2023-05-14 MED ORDER — ALPRAZOLAM 0.25 MG PO TABS: ORAL_TABLET | ORAL | 0 refills | Status: AC

## 2023-05-14 NOTE — Progress Notes (Signed)
 [facility] [address] [place], [place] [address]  Internal MEDICINE  Office Visit Note  Patient Name: Shelley [name]  [id number]  [id number]  Date of Service: 05/28/2023  Chief Complaint  Patient presents with   Follow-up   Abdominal Pain    HPI Pt is here for ED follow up -went to ED for abdominal pain on 05/07/23 to [facility] and on 05/08/23 to [facility] an antibiotic for cellulitis of leg and thought this was what caused the abdominal pain. Once this was stopped started to improve some -At first ED visit she had CT scan for the pain and found 10x6cm left ovarian solid mass concerning for neoplasm, also enlarged uterus with calcified fibroids. GYN consulted and US done to rule out torsion and ultimately discharged with plan for OP GYN follow up -Went back to alternate ED on 05/08/23 and was given carafate and pantoprazole due to continued epigastric tenderness and reflux. She has a known hiatal hernia which may contribute. She was urged to continue with plan to follow up with GYN for previously found CT findings  -She has now seen GYN and had labs and future MRI. Labs reassuring to point away from ovarian cancer, however still needs MRI and told that this needs to be removed regardless due to size -more anxious since diagnosis. Will restart lexapro--had stopped awhile ago -Has paperwork to be completed. 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 May need more adjustment pending surgical intervention  Current Medication: Outpatient Encounter Medications as of 05/14/2023  Medication Sig   apixaban (ELIQUIS) 5 MG TABS tablet Take 1 tablet (5 mg total) by mouth 2 (two) times daily.   diltiazem (CARDIZEM CD) 180 MG 24 hr capsule Take 1  capsule (180 mg total) by mouth daily.   famotidine (PEPCID) 10 MG tablet Take 1 tablet (10 mg total) by mouth daily.   loratadine (CLARITIN) 10 MG tablet Take 10 mg by mouth daily.   Multiple Vitamin (MULTIVITAMIN) tablet Take 1 tablet by mouth daily.   mupirocin ointment (BACTROBAN) 2 % Apply 1 Application topically 2 (two) times daily. To affected area until healed.   omeprazole (PRILOSEC) 40 MG capsule TAKE 1 CAPSULE BY MOUTH TWICE DAILY FOR HEARTBURN**MAX REACHED ONLY 30 CAPS ALLOWED   oxyCODONE (ROXICODONE) 5 MG immediate release tablet Take 1 tablet (5 mg total) by mouth every 6 (six) hours as needed for breakthrough pain.   PANTOPRAZOLE SODIUM PO Take 20 mg by mouth daily. Daily for 20 days   senna-docusate (SENOKOT-S) 8.6-50 MG tablet Take 2 tablets by mouth 2 (two) times daily.   sucralfate (CARAFATE) 1 g tablet Take 1 g by mouth 4 (four) times daily.   [DISCONTINUED] ALPRAZolam (XANAX) 0.25 MG tablet Take half tab twice a day as needed for severe panic attacks   [DISCONTINUED] escitalopram (LEXAPRO) 10 MG tablet TAKE 1 TABLET BY MOUTH ONCE DAILY WITH  SUPPER  FOR  PANIC  ATTACKS   ALPRAZolam (XANAX) 0.25 MG tablet Take half tab twice a day as needed for severe panic attacks   escitalopram (LEXAPRO) 10 MG tablet TAKE 1 TABLET BY MOUTH ONCE DAILY WITH  SUPPER  FOR  PANIC  ATTACKS   No facility-administered encounter medications on file as of 05/14/2023.    Surgical History: Past Surgical History:  Procedure Laterality Date   BREAST BIOPSY Right 11/20/2022   [place] bx,  marker, path pending   BREAST BIOPSY Right 11/20/2022   [place] RT BREAST BX W LOC DEV 1ST LESION IMG BX SPEC US GUIDE 11/20/2022 [facility]   COLONOSCOPY WITH PROPOFOL N/A 02/27/2021   Procedure: COLONOSCOPY WITH PROPOFOL;  Surgeon: Midge Minium, MD;  Location: [facility] ENDOSCOPY;  Service: Endoscopy;  Laterality: N/A;   ESOPHAGOGASTRODUODENOSCOPY  02/27/2021   Procedure: ESOPHAGOGASTRODUODENOSCOPY (EGD);  Surgeon: Midge Minium, MD;  Location: [facility] ENDOSCOPY;  Service: Endoscopy;;   IVC FILTER INSERTION N/A 10/02/2022   Procedure: IVC FILTER INSERTION;  Surgeon: Annice Needy, MD;  Location: [facility] INVASIVE CV LAB;  Service: Cardiovascular;  Laterality: N/A;   PULMONARY THROMBECTOMY N/A 10/02/2022   Procedure: PULMONARY THROMBECTOMY;  Surgeon: Annice Needy, MD;  Location: [facility] INVASIVE CV LAB;  Service: Cardiovascular;  Laterality: N/A;   RIGHT/LEFT HEART CATH AND CORONARY ANGIOGRAPHY Bilateral 05/26/2019   Procedure: RIGHT/LEFT HEART CATH AND CORONARY ANGIOGRAPHY;  Surgeon: Yvonne Kendall, MD;  Location: [facility] INVASIVE CV LAB;  Service: Cardiovascular;  Laterality: Bilateral;   TUBAL LIGATION      Medical History: Past Medical History:  Diagnosis Date   Anemia    Anxiety    Arthritis    Cardiomegaly    Family history of breast cancer    Family history of ovarian cancer    5/22 cancer genetic testing letter sent   Hypertension    Hyperthyroidism    Pulmonary hypertension (HCC)    Sleep apnea    Thyroid disease    Vertigo     Family History: Family History  Problem Relation Age of Onset   Heart attack Mother    Ovarian cancer Mother    Cancer Father    Cancer Sister    Breast cancer Maternal Grandmother    Breast cancer Cousin        1st maternal cousin   Cancer Other     Social History   Socioeconomic History   Marital status: Single    Spouse name: Not on file   Number of children: Not on file   Years of education: Not on file   Highest education level: Not on file  Occupational History   Not on file  Tobacco Use   Smoking status: Never    Passive exposure: Never   Smokeless tobacco: Never  Vaping Use   Vaping status: Never Used  Substance and Sexual Activity   Alcohol use: No   Drug use: No   Sexual activity: Not Currently    Birth control/protection: Surgical  Other Topics Concern   Not on file  Social History Narrative   Not on file   Social Drivers of Health    Financial Resource Strain: Not on file  Food Insecurity: No Food Insecurity (10/01/2022)   Hunger Vital Sign    Worried About Running Out of Food in the Last Year: Never true    Ran Out of Food in the Last Year: Never true  Transportation Needs: No Transportation Needs (10/01/2022)   PRAPARE - Administrator, Civil Service (Medical): No    Lack of Transportation (Non-Medical): No  Physical Activity: Not on file  Stress: Not on file  Social Connections: Not on file  Intimate Partner Violence: Patient Unable To Answer (10/01/2022)   Humiliation, Afraid, Rape, and Kick questionnaire    Fear of Current or Ex-Partner: Patient unable to answer    Emotionally Abused: Patient unable to answer  Physically Abused: Patient unable to answer    Sexually Abused: Patient unable to answer      Review of Systems  Constitutional:  Negative for chills, fatigue and unexpected weight change.  HENT: Negative.  Negative for congestion, rhinorrhea, sneezing and sore throat.   Eyes:  Negative for redness.  Respiratory: Negative.  Negative for cough, chest tightness and shortness of breath.   Cardiovascular: Negative.  Negative for chest pain and palpitations.  Gastrointestinal:  Negative for constipation, diarrhea, nausea and vomiting.       Reflux  Genitourinary:  Negative for dysuria and frequency.  Musculoskeletal:  Negative for arthralgias, back pain, joint swelling and neck pain.  Skin:  Negative for rash.  Neurological: Negative.  Negative for tremors and numbness.  Hematological:  Negative for adenopathy. Does not bruise/bleed easily.  Psychiatric/Behavioral:  Negative for behavioral problems (Depression) and suicidal ideas. The patient is nervous/anxious.     Vital Signs: BP (!) 130/90   Pulse 75   Temp 98.1 F (36.7 C)   Resp 16   Ht 5\' 6"  (1.676 m)   Wt 294 lb (133.4 kg)   LMP  (LMP Unknown)   SpO2 97%   BMI 47.45 kg/m    Physical Exam Vitals reviewed.   Constitutional:      General: She is not in acute distress.    Appearance: Normal appearance. She is obese. She is not ill-appearing.  HENT:     Head: Normocephalic and atraumatic.  Eyes:     Pupils: Pupils are equal, round, and reactive to light.  Cardiovascular:     Rate and Rhythm: Normal rate and regular rhythm.  Pulmonary:     Effort: Pulmonary effort is normal. No respiratory distress.  Neurological:     Mental Status: She is oriented to person, place, and time.  Psychiatric:        Thought Content: Thought content normal.        Judgment: Judgment normal.     Comments: Anxious in office        Assessment/Plan: 1. GAD (generalized anxiety disorder) (Primary) Will restart lexapro daily and may need to titrate in future. May use xanax prn - ALPRAZolam (XANAX) 0.25 MG tablet; Take half tab twice a day as needed for severe panic attacks  Dispense: 30 tablet; Refill: 0 - escitalopram (LEXAPRO) 10 MG tablet; TAKE 1 TABLET BY MOUTH ONCE DAILY WITH  SUPPER  FOR  PANIC  ATTACKS  Dispense: 90 tablet; Refill: 2  2. Ovarian mass, left Following with GYN/surgery  3. Gastroesophageal reflux disease with hiatal hernia Will continue current medication and follow up with GS   General Counseling: Marjani verbalizes understanding of the findings of todays visit and agrees with plan of treatment. I have discussed any further diagnostic evaluation that may be needed or ordered today. We also reviewed her medications today. she has been encouraged to call the office with any questions or concerns that should arise related to todays visit.    No orders of the defined types were placed in this encounter.   Meds ordered this encounter  Medications   ALPRAZolam (XANAX) 0.25 MG tablet    Sig: Take half tab twice a day as needed for severe panic attacks    Dispense:  30 tablet    Refill:  0   escitalopram (LEXAPRO) 10 MG tablet    Sig: TAKE 1 TABLET BY MOUTH ONCE DAILY WITH  SUPPER   FOR  PANIC  ATTACKS    Dispense:  90 tablet    Refill:  2    This patient was seen by Lynn Ito, PA-C in collaboration with Dr. Beverely Risen as a part of collaborative care agreement.   Total time spent:35 Minutes Time spent includes review of chart, medications, test results, and follow up plan with the patient.      Dr Lyndon Code Internal medicine

## 2023-06-01 ENCOUNTER — Telehealth: Payer: Self-pay | Admitting: Obstetrics

## 2023-06-01 NOTE — Telephone Encounter (Signed)
 [facility] is calling to find out the ordering provider for this patient. Please contact Ariel at [phone] ext [phone].

## 2023-06-03 NOTE — Telephone Encounter (Signed)
 Called back and spoke to [place] and stated Dr. Lonny Prude is the ordering provider. They also need insurance information sent for patient. Secure Email provided: [email or web address]

## 2023-06-03 NOTE — Telephone Encounter (Signed)
 Fax working and fax sent.

## 2023-06-08 ENCOUNTER — Telehealth: Payer: Self-pay | Admitting: Nurse Practitioner

## 2023-06-08 ENCOUNTER — Telehealth: Payer: Self-pay

## 2023-06-08 DIAGNOSIS — Z8041 Family history of malignant neoplasm of ovary: Secondary | ICD-10-CM

## 2023-06-08 DIAGNOSIS — Z803 Family history of malignant neoplasm of breast: Secondary | ICD-10-CM

## 2023-06-08 DIAGNOSIS — Z1371 Encounter for nonprocreative screening for genetic disease carrier status: Secondary | ICD-10-CM

## 2023-06-08 HISTORY — DX: Family history of malignant neoplasm of breast: Z80.3

## 2023-06-08 HISTORY — DX: Family history of malignant neoplasm of ovary: Z80.41

## 2023-06-08 HISTORY — DX: Encounter for nonprocreative screening for genetic disease carrier status: Z13.71

## 2023-06-08 NOTE — Telephone Encounter (Signed)
 Received pap CMN from [facility]. S/w Tresa Endo to have provider updated to [facility] on CMN-Toni

## 2023-06-08 NOTE — Telephone Encounter (Signed)
 Received pap order from [facility]. Gave to Alyssa for signature-Toni

## 2023-06-08 NOTE — Telephone Encounter (Signed)
 Shelley [name] called triage line stating she has a MRI on 06/10/2023 and needed a medication, I called her back and left a voicemail

## 2023-06-08 NOTE — Telephone Encounter (Signed)
 Lvm to schedule pulmonary f/u with [facility] around 06/28/23-Toni

## 2023-06-09 ENCOUNTER — Other Ambulatory Visit: Payer: Self-pay | Admitting: Certified Nurse Midwife

## 2023-06-09 DIAGNOSIS — Z8041 Family history of malignant neoplasm of ovary: Secondary | ICD-10-CM

## 2023-06-09 DIAGNOSIS — N838 Other noninflammatory disorders of ovary, fallopian tube and broad ligament: Secondary | ICD-10-CM

## 2023-06-09 MED ORDER — LORAZEPAM 1 MG PO TABS: 1.0000 mg | ORAL_TABLET | Freq: Once | ORAL | 0 refills | Status: AC

## 2023-06-09 NOTE — Progress Notes (Addendum)
 MRI can either be done with oral anti-anxiety medication at [facility] or if general anesthesia needed this is only available at [facility] location. New order entered so that MRI with GA can be scheduled. LVM for patient reviewing options.  1418: patient returned call, requests oral medication be ordered & desires to complete MRI without general anesthesia

## 2023-06-09 NOTE — Addendum Note (Signed)
 Addended by: Hartley Barefoot on: 06/09/2023 02:19 PM   Modules accepted: Orders

## 2023-06-09 NOTE — Telephone Encounter (Signed)
 Can you send her in something to help with her claustrophobia during her MRI?

## 2023-06-09 NOTE — Telephone Encounter (Signed)
 Pt called triage to follow up on previous msg. She said she wants to be sedated at visit. Does NOT want Rx sent to pharmacy for her to take before MRI. Please advise.

## 2023-06-10 ENCOUNTER — Ambulatory Visit
Admission: RE | Admit: 2023-06-10 | Discharge: 2023-06-10 | Disposition: A | Discharge status: Home / Self Care | Payer: Self-pay | Source: Ambulatory Visit | Attending: Obstetrics | Admitting: Obstetrics

## 2023-06-10 DIAGNOSIS — N838 Other noninflammatory disorders of ovary, fallopian tube and broad ligament: Secondary | ICD-10-CM

## 2023-06-10 DIAGNOSIS — D259 Leiomyoma of uterus, unspecified: Secondary | ICD-10-CM | POA: Diagnosis not present

## 2023-06-10 MED ORDER — GADOBUTROL 1 MMOL/ML IV SOLN
10.0000 mL | Freq: Once | INTRAVENOUS | Status: AC | PRN
  Administered 2023-06-10: 10 mL via INTRAVENOUS

## 2023-06-11 ENCOUNTER — Encounter: Payer: Self-pay | Admitting: Physician Assistant

## 2023-06-11 ENCOUNTER — Ambulatory Visit (INDEPENDENT_AMBULATORY_CARE_PROVIDER_SITE_OTHER): Admitting: Internal Medicine

## 2023-06-11 VITALS — BP 125/75 | HR 62 | Temp 98.2°F | Resp 16 | Ht 66.0 in | Wt 294.0 lb

## 2023-06-11 DIAGNOSIS — G4733 Obstructive sleep apnea (adult) (pediatric): Secondary | ICD-10-CM | POA: Diagnosis not present

## 2023-06-11 DIAGNOSIS — F411 Generalized anxiety disorder: Secondary | ICD-10-CM | POA: Diagnosis not present

## 2023-06-11 DIAGNOSIS — M150 Primary generalized (osteo)arthritis: Secondary | ICD-10-CM | POA: Diagnosis not present

## 2023-06-11 DIAGNOSIS — N838 Other noninflammatory disorders of ovary, fallopian tube and broad ligament: Secondary | ICD-10-CM

## 2023-06-11 NOTE — Progress Notes (Signed)
 [facility] [address] [place], [place] [address]  Internal MEDICINE  Office Visit Note  Patient Name: Shelley [name]  [id number]  [id number]  Date of Service: 06/11/2023  Chief Complaint  Patient presents with   Follow-up    Pt is here for follow up after starting on Lexapro    HPI Pt is seen today for f/u, daughter in the room, was having panic attacks, Lexapro did help, will like to continue  Continues to be on CPAP machine with oxygen She did have PELVIC MRI for ovarian mass, results are pending, negative BRCA and CA 125  BP is well controlled     Current Medication: Outpatient Encounter Medications as of 06/11/2023  Medication Sig   ALPRAZolam (XANAX) 0.25 MG tablet Take half tab twice a day as needed for severe panic attacks   apixaban (ELIQUIS) 5 MG TABS tablet Take 1 tablet (5 mg total) by mouth 2 (two) times daily.   diltiazem (CARDIZEM CD) 180 MG 24 hr capsule Take 1 capsule (180 mg total) by mouth daily.   escitalopram (LEXAPRO) 10 MG tablet TAKE 1 TABLET BY MOUTH ONCE DAILY WITH  SUPPER  FOR  PANIC  ATTACKS   famotidine (PEPCID) 10 MG tablet Take 1 tablet (10 mg total) by mouth daily.   loratadine (CLARITIN) 10 MG tablet Take 10 mg by mouth daily.   Multiple Vitamin (MULTIVITAMIN) tablet Take 1 tablet by mouth daily.   mupirocin ointment (BACTROBAN) 2 % Apply 1 Application topically 2 (two) times daily. To affected area until healed.   omeprazole (PRILOSEC) 40 MG capsule TAKE 1 CAPSULE BY MOUTH TWICE DAILY FOR HEARTBURN**MAX REACHED ONLY 30 CAPS ALLOWED   oxyCODONE (ROXICODONE) 5 MG immediate release tablet Take 1 tablet (5 mg total) by mouth every 6 (six) hours as needed for breakthrough pain.   PANTOPRAZOLE SODIUM PO Take 20 mg by mouth daily. Daily for 20 days   senna-docusate (SENOKOT-S) 8.6-50 MG tablet Take 2 tablets by mouth 2 (two) times daily.   sucralfate (CARAFATE) 1 g tablet Take 1 g by mouth 4 (four) times daily.   No  facility-administered encounter medications on file as of 06/11/2023.    Surgical History: Past Surgical History:  Procedure Laterality Date   BREAST BIOPSY Right 11/20/2022   [place] bx,  marker, path pending   BREAST BIOPSY Right 11/20/2022   [place] RT BREAST BX W LOC DEV 1ST LESION IMG BX SPEC US GUIDE 11/20/2022 [facility]   COLONOSCOPY WITH PROPOFOL N/A 02/27/2021   Procedure: COLONOSCOPY WITH PROPOFOL;  Surgeon: Midge Minium, MD;  Location: [facility] ENDOSCOPY;  Service: Endoscopy;  Laterality: N/A;   ESOPHAGOGASTRODUODENOSCOPY  02/27/2021   Procedure: ESOPHAGOGASTRODUODENOSCOPY (EGD);  Surgeon: Midge Minium, MD;  Location: [facility] ENDOSCOPY;  Service: Endoscopy;;   IVC FILTER INSERTION N/A 10/02/2022   Procedure: IVC FILTER INSERTION;  Surgeon: Annice Needy, MD;  Location: [facility] INVASIVE CV LAB;  Service: Cardiovascular;  Laterality: N/A;   PULMONARY THROMBECTOMY N/A 10/02/2022   Procedure: PULMONARY THROMBECTOMY;  Surgeon: Annice Needy, MD;  Location: [facility] INVASIVE CV LAB;  Service: Cardiovascular;  Laterality: N/A;   RIGHT/LEFT HEART CATH AND CORONARY ANGIOGRAPHY Bilateral 05/26/2019   Procedure: RIGHT/LEFT HEART CATH AND CORONARY ANGIOGRAPHY;  Surgeon: Yvonne Kendall, MD;  Location: [facility] INVASIVE CV LAB;  Service: Cardiovascular;  Laterality: Bilateral;   TUBAL LIGATION      Medical History: Past Medical History:  Diagnosis Date   Anemia    Anxiety    Arthritis  Cardiomegaly    Family history of breast cancer    Family history of ovarian cancer    5/22 cancer genetic testing letter sent   Hypertension    Hyperthyroidism    Pulmonary hypertension (HCC)    Sleep apnea    Thyroid disease    Vertigo     Family History: Family History  Problem Relation Age of Onset   Heart attack Mother    Ovarian cancer Mother    Cancer Father    Cancer Sister    Breast cancer Maternal Grandmother    Breast cancer Cousin        1st maternal cousin   Cancer Other     Social History    Socioeconomic History   Marital status: Single    Spouse name: Not on file   Number of children: Not on file   Years of education: Not on file   Highest education level: Not on file  Occupational History   Not on file  Tobacco Use   Smoking status: Never    Passive exposure: Never   Smokeless tobacco: Never  Vaping Use   Vaping status: Never Used  Substance and Sexual Activity   Alcohol use: No   Drug use: No   Sexual activity: Not Currently    Birth control/protection: Surgical  Other Topics Concern   Not on file  Social History Narrative   Not on file   Social Drivers of Health   Financial Resource Strain: Not on file  Food Insecurity: No Food Insecurity (10/01/2022)   Hunger Vital Sign    Worried About Running Out of Food in the Last Year: Never true    Ran Out of Food in the Last Year: Never true  Transportation Needs: No Transportation Needs (10/01/2022)   PRAPARE - Administrator, Civil Service (Medical): No    Lack of Transportation (Non-Medical): No  Physical Activity: Not on file  Stress: Not on file  Social Connections: Not on file  Intimate Partner Violence: Patient Unable To Answer (10/01/2022)   Humiliation, Afraid, Rape, and Kick questionnaire    Fear of Current or Ex-Partner: Patient unable to answer    Emotionally Abused: Patient unable to answer    Physically Abused: Patient unable to answer    Sexually Abused: Patient unable to answer      Review of Systems  Constitutional:  Negative for fatigue and fever.  HENT:  Negative for congestion, mouth sores and postnasal drip.   Respiratory:  Negative for cough.   Cardiovascular:  Negative for chest pain.  Genitourinary:  Negative for flank pain.  Psychiatric/Behavioral: Negative.      Vital Signs: BP 125/75   Pulse 62   Temp 98.2 F (36.8 C)   Resp 16   Ht 5\' 6"  (1.676 m)   Wt 294 lb (133.4 kg)   LMP  (LMP Unknown)   SpO2 97%   BMI 47.45 kg/m    Physical  Exam Constitutional:      Appearance: Normal appearance.  HENT:     Head: Normocephalic and atraumatic.     Nose: Nose normal.     Mouth/Throat:     Mouth: Mucous membranes are moist.     Pharynx: No posterior oropharyngeal erythema.  Eyes:     Extraocular Movements: Extraocular movements intact.     Pupils: Pupils are equal, round, and reactive to light.  Cardiovascular:     Pulses: Normal pulses.     Heart sounds: Normal heart  sounds.  Pulmonary:     Effort: Pulmonary effort is normal.     Breath sounds: Normal breath sounds.  Neurological:     General: No focal deficit present.     Mental Status: She is alert.  Psychiatric:        Mood and Affect: Mood normal.        Behavior: Behavior normal.      Assessment/Plan: 1. Ovarian mass, left (Primary) Pending MRI results, followed by gyne  2. GAD (generalized anxiety disorder) Continue Lexapro   3. OSA on CPAP Continue CPAP  4. Primary generalized (osteo)arthritis Stable    General Counseling: Timya verbalizes understanding of the findings of todays visit and agrees with plan of treatment. I have discussed any further diagnostic evaluation that may be needed or ordered today. We also reviewed her medications today. she has been encouraged to call the office with any questions or concerns that should arise related to todays visit.    Total time spent:30 Minutes Time spent includes review of chart, medications, test results, and follow up plan with the patient.   [place] Controlled Substance Database was reviewed by me.   Dr Lyndon Code Internal medicine

## 2023-06-13 DIAGNOSIS — I270 Primary pulmonary hypertension: Secondary | ICD-10-CM | POA: Diagnosis not present

## 2023-06-14 ENCOUNTER — Telehealth: Payer: Self-pay | Admitting: Nurse Practitioner

## 2023-06-14 NOTE — Telephone Encounter (Signed)
 Cpap order signed & faxed back to [facility]; [phone]. Scanned-Toni

## 2023-06-15 ENCOUNTER — Other Ambulatory Visit: Payer: Self-pay

## 2023-06-15 ENCOUNTER — Telehealth: Payer: Self-pay | Admitting: Obstetrics and Gynecology

## 2023-06-15 ENCOUNTER — Encounter: Payer: Self-pay | Admitting: Obstetrics

## 2023-06-15 MED ORDER — DILTIAZEM HCL ER COATED BEADS 180 MG PO CP24: 180.0000 mg | ORAL_CAPSULE | Freq: Every day | ORAL | 1 refills | Status: DC

## 2023-06-15 NOTE — Telephone Encounter (Signed)
 Contacted the patient via phone, no answer voicemail is full. Calling to scheduled surgical consult for fibroids w/bulk symptom with Dr Logan Bores. Next opening 4/29 at 10:55 am with Dr Logan Bores.

## 2023-06-16 NOTE — Telephone Encounter (Signed)
 Contacted the patient via phone. She is confirmed for 4/29 with Dr Logan Bores.

## 2023-06-23 ENCOUNTER — Telehealth: Payer: Self-pay

## 2023-06-23 NOTE — Telephone Encounter (Signed)
 [facility] called our office wanting to know what insurance patient had in March 2025. They were advised by [place] that insurance with them 05/2023 was inactive. Called pt, no answer, LVMTRC.

## 2023-06-29 DIAGNOSIS — G4733 Obstructive sleep apnea (adult) (pediatric): Secondary | ICD-10-CM | POA: Diagnosis not present

## 2023-07-02 ENCOUNTER — Encounter: Payer: Self-pay | Admitting: Nurse Practitioner

## 2023-07-02 ENCOUNTER — Ambulatory Visit (INDEPENDENT_AMBULATORY_CARE_PROVIDER_SITE_OTHER): Admitting: Nurse Practitioner

## 2023-07-02 VITALS — BP 130/84 | HR 84 | Temp 98.4°F | Resp 16 | Ht 66.0 in | Wt 294.0 lb

## 2023-07-02 DIAGNOSIS — R0602 Shortness of breath: Secondary | ICD-10-CM

## 2023-07-02 DIAGNOSIS — G4734 Idiopathic sleep related nonobstructive alveolar hypoventilation: Secondary | ICD-10-CM

## 2023-07-02 DIAGNOSIS — G4733 Obstructive sleep apnea (adult) (pediatric): Secondary | ICD-10-CM | POA: Diagnosis not present

## 2023-07-02 NOTE — Progress Notes (Signed)
 [facility] [address] [place], [place] [address]  Internal MEDICINE  Office Visit Note  Patient Name: Shelley [name]  [id number]  [id number]  Date of Service: 07/02/2023  Chief Complaint  Patient presents with   Follow-up    HPI [name] presents for a follow-up visit for OSA, nocturnal hypoxia and SOB OSA on CPAP -- issues with data card in CPAP machine nottransmitting, [facility] is aware and will be sending a new card to the patient. Getting supplies sent from DME. Using CPAP every night with no issues.  Nocturnal hypoxia -- still requiring supplemental oxygen  at night with her CPAP. Last overnight pulse oximetry reflected this.  SOB -- annual PFT was normal last year, due for repeat PFT later in the year.     Current Medication: Outpatient Encounter Medications as of 07/02/2023  Medication Sig   ALPRAZolam  (XANAX ) 0.25 MG tablet Take half tab twice a day as needed for severe panic attacks   apixaban  (ELIQUIS ) 5 MG TABS tablet Take 1 tablet (5 mg total) by mouth 2 (two) times daily.   diltiazem  (CARDIZEM  CD) 180 MG 24 hr capsule Take 1 capsule (180 mg total) by mouth daily.   escitalopram  (LEXAPRO ) 10 MG tablet TAKE 1 TABLET BY MOUTH ONCE DAILY WITH  SUPPER  FOR  PANIC  ATTACKS   famotidine  (PEPCID ) 10 MG tablet Take 1 tablet (10 mg total) by mouth daily.   loratadine  (CLARITIN ) 10 MG tablet Take 10 mg by mouth daily.   Multiple Vitamin (MULTIVITAMIN) tablet Take 1 tablet by mouth daily.   mupirocin  ointment (BACTROBAN ) 2 % Apply 1 Application topically 2 (two) times daily. To affected area until healed.   omeprazole  (PRILOSEC) 40 MG capsule TAKE 1 CAPSULE BY MOUTH TWICE DAILY FOR HEARTBURN**MAX REACHED ONLY 30 CAPS ALLOWED   oxyCODONE  (ROXICODONE ) 5 MG immediate release tablet Take 1 tablet (5 mg total) by mouth every 6 (six) hours as needed for breakthrough pain.   PANTOPRAZOLE  SODIUM PO Take 20 mg by mouth daily. Daily for 20 days   senna-docusate (SENOKOT-S)  8.6-50 MG tablet Take 2 tablets by mouth 2 (two) times daily.   sucralfate (CARAFATE) 1 g tablet Take 1 g by mouth 4 (four) times daily.   No facility-administered encounter medications on file as of 07/02/2023.    Surgical History: Past Surgical History:  Procedure Laterality Date   BREAST BIOPSY Right 11/20/2022   us  bx,  marker, path pending   BREAST BIOPSY Right 11/20/2022   US  RT BREAST BX W LOC DEV 1ST LESION IMG BX SPEC US  GUIDE 11/20/2022 [facility]   COLONOSCOPY WITH PROPOFOL  N/A 02/27/2021   Procedure: COLONOSCOPY WITH PROPOFOL ;  Surgeon: Marnee Sink, MD;  Location: [facility] ENDOSCOPY;  Service: Endoscopy;  Laterality: N/A;   ESOPHAGOGASTRODUODENOSCOPY  02/27/2021   Procedure: ESOPHAGOGASTRODUODENOSCOPY (EGD);  Surgeon: Marnee Sink, MD;  Location: [facility] ENDOSCOPY;  Service: Endoscopy;;   IVC FILTER INSERTION N/A 10/02/2022   Procedure: IVC FILTER INSERTION;  Surgeon: Celso College, MD;  Location: [facility] INVASIVE CV LAB;  Service: Cardiovascular;  Laterality: N/A;   PULMONARY THROMBECTOMY N/A 10/02/2022   Procedure: PULMONARY THROMBECTOMY;  Surgeon: Celso College, MD;  Location: [facility] INVASIVE CV LAB;  Service: Cardiovascular;  Laterality: N/A;   RIGHT/LEFT HEART CATH AND CORONARY ANGIOGRAPHY Bilateral 05/26/2019   Procedure: RIGHT/LEFT HEART CATH AND CORONARY ANGIOGRAPHY;  Surgeon: Sammy Crisp, MD;  Location: [facility] INVASIVE CV LAB;  Service: Cardiovascular;  Laterality: Bilateral;   TUBAL LIGATION      Medical  History: Past Medical History:  Diagnosis Date   Anemia    Anxiety    Arthritis    Cardiomegaly    Family history of breast cancer    Family history of ovarian cancer    5/22 cancer genetic testing letter sent   Hypertension    Hyperthyroidism    Pulmonary hypertension (HCC)    Sleep apnea    Thyroid  disease    Vertigo     Family History: Family History  Problem Relation Age of Onset   Heart attack Mother    Ovarian cancer Mother    Cancer Father     Cancer Sister    Breast cancer Maternal Grandmother    Breast cancer Cousin        1st maternal cousin   Cancer Other     Social History   Socioeconomic History   Marital status: Single    Spouse name: Not on file   Number of children: Not on file   Years of education: Not on file   Highest education level: Not on file  Occupational History   Not on file  Tobacco Use   Smoking status: Never    Passive exposure: Never   Smokeless tobacco: Never  Vaping Use   Vaping status: Never Used  Substance and Sexual Activity   Alcohol use: No   Drug use: No   Sexual activity: Not Currently    Birth control/protection: Surgical  Other Topics Concern   Not on file  Social History Narrative   Not on file   Social Drivers of Health   Financial Resource Strain: Not on file  Food Insecurity: No Food Insecurity (10/01/2022)   Hunger Vital Sign    Worried About Running Out of Food in the Last Year: Never true    Ran Out of Food in the Last Year: Never true  Transportation Needs: No Transportation Needs (10/01/2022)   PRAPARE - Administrator, Civil Service (Medical): No    Lack of Transportation (Non-Medical): No  Physical Activity: Not on file  Stress: Not on file  Social Connections: Not on file  Intimate Partner Violence: Patient Unable To Answer (10/01/2022)   Humiliation, Afraid, Rape, and Kick questionnaire    Fear of Current or Ex-Partner: Patient unable to answer    Emotionally Abused: Patient unable to answer    Physically Abused: Patient unable to answer    Sexually Abused: Patient unable to answer      Review of Systems  Constitutional:  Negative for chills, fatigue and unexpected weight change.  HENT: Negative.  Negative for congestion, rhinorrhea, sneezing and sore throat.   Eyes:  Negative for redness.  Respiratory:  Positive for shortness of breath (intermittent). Negative for cough, chest tightness and wheezing.   Cardiovascular: Negative.  Negative  for chest pain and palpitations.  Gastrointestinal: Negative.  Negative for abdominal pain, constipation, diarrhea, nausea and vomiting.  Genitourinary:  Negative for dysuria and frequency.  Musculoskeletal:  Negative for arthralgias, back pain, joint swelling and neck pain.  Skin:  Negative for rash.  Neurological: Negative.  Negative for tremors and numbness.  Hematological:  Negative for adenopathy. Does not bruise/bleed easily.  Psychiatric/Behavioral:  Negative for behavioral problems (Depression), sleep disturbance and suicidal ideas. The patient is not nervous/anxious.     Vital Signs: BP 130/84   Pulse 84   Temp 98.4 F (36.9 C)   Resp 16   Ht 5\' 6"  (1.676 m)   Wt 294 lb (133.4 kg)  LMP  (LMP Unknown)   SpO2 95%   BMI 47.45 kg/m    Physical Exam Vitals reviewed.  Constitutional:      General: She is not in acute distress.    Appearance: Normal appearance. She is obese. She is not ill-appearing.  HENT:     Head: Normocephalic and atraumatic.  Eyes:     Pupils: Pupils are equal, round, and reactive to light.  Cardiovascular:     Rate and Rhythm: Normal rate and regular rhythm.  Pulmonary:     Effort: Pulmonary effort is normal. No respiratory distress.  Neurological:     Mental Status: She is oriented to person, place, and time.  Psychiatric:        Mood and Affect: Mood normal.        Behavior: Behavior normal.        Assessment/Plan: 1. OSA on CPAP (Primary) Continue CPAP use as instructed.   2. Nocturnal hypoxia Continue supplemental oxygen  use as instructed.   3. SOB (shortness of breath) PFT ordered to be done later this year, will follow up in 6 months to discuss PFT results.  - Pulmonary function test; Future   General Counseling: Keiasha verbalizes understanding of the findings of todays visit and agrees with plan of treatment. I have discussed any further diagnostic evaluation that may be needed or ordered today. We also reviewed her  medications today. she has been encouraged to call the office with any questions or concerns that should arise related to todays visit.    Orders Placed This Encounter  Procedures   Pulmonary function test    No orders of the defined types were placed in this encounter.   Return in about 6 months (around 01/12/2024) for F/U, pulmonary/sleep, Brezlyn Manrique or DSK, need PFT done in october as ordered. .   Total time spent:30 Minutes Time spent includes review of chart, medications, test results, and follow up plan with the patient.   [place] Controlled Substance Database was reviewed by me.  This patient was seen by Laurence Pons, FNP-C in collaboration with Dr. Verneta Gone as a part of collaborative care agreement.   Larnie Heart R. Bobbi Burow, MSN, FNP-C Internal medicine

## 2023-07-03 ENCOUNTER — Encounter: Payer: Self-pay | Admitting: Nurse Practitioner

## 2023-07-06 ENCOUNTER — Ambulatory Visit: Admitting: Obstetrics and Gynecology

## 2023-07-06 ENCOUNTER — Encounter: Payer: Self-pay | Admitting: Obstetrics and Gynecology

## 2023-07-06 VITALS — BP 129/88 | HR 63 | Ht 66.0 in | Wt 295.2 lb

## 2023-07-06 DIAGNOSIS — D259 Leiomyoma of uterus, unspecified: Secondary | ICD-10-CM

## 2023-07-06 DIAGNOSIS — D219 Benign neoplasm of connective and other soft tissue, unspecified: Secondary | ICD-10-CM

## 2023-07-06 NOTE — Progress Notes (Signed)
 HPI:      [name] is a 63 y.o. [phone] who LMP was No LMP recorded (lmp unknown). Patient is postmenopausal.  Subjective:   She presents today to discuss large uterine fibroids.  She was recently seen in the emergency department and underwent ultrasound CT and subsequent MRI.  She was found to have large uterine fibroids 1 of which is pedunculated and 15 cm in size.  (The pedunculated fibroid was originally thought to be an ovarian tumor). She reports that she is relatively asymptomatic with these fibroids.  Denies pelvic pain denies issues with bowel movements or urination.  She reports no bleeding. Of significant note, patient has had DVT with PE and is currently on Eliquis . Previous abdominal surgery includes tubal ligation.    Hx: The following portions of the patient's history were reviewed and updated as appropriate:             She  has a past medical history of Anemia, Anxiety, Arthritis, Cardiomegaly, Family history of breast cancer, Family history of ovarian cancer, Hypertension, Hyperthyroidism, Pulmonary hypertension (HCC), Sleep apnea, Thyroid  disease, and Vertigo. She does not have any pertinent problems on file. She  has a past surgical history that includes Tubal ligation; RIGHT/LEFT HEART CATH AND CORONARY ANGIOGRAPHY (Bilateral, 05/26/2019); Colonoscopy with propofol  (N/A, 02/27/2021); Esophagogastroduodenoscopy (02/27/2021); PULMONARY THROMBECTOMY (N/A, 10/02/2022); IVC FILTER INSERTION (N/A, 10/02/2022); Breast biopsy (Right, 11/20/2022); and Breast biopsy (Right, 11/20/2022). Her family history includes Breast cancer in her cousin and maternal grandmother; Cancer in her father, sister, and another family member; Heart attack in her mother; Ovarian cancer in her mother. She  reports that she has never smoked. She has never been exposed to tobacco smoke. She has never used smokeless tobacco. She reports that she does not drink alcohol and does not use drugs. She has  a current medication list which includes the following prescription(s): alprazolam , apixaban , diltiazem , escitalopram , famotidine , loratadine , multivitamin, mupirocin  ointment, omeprazole , and pantoprazole  sodium. She is allergic to amlodipine  and keflex  [cephalexin ].       Review of Systems:  Review of Systems  Constitutional: Denied constitutional symptoms, night sweats, recent illness, fatigue, fever, insomnia and weight loss.  Eyes: Denied eye symptoms, eye pain, photophobia, vision change and visual disturbance.  Ears/Nose/Throat/Neck: Denied ear, nose, throat or neck symptoms, hearing loss, nasal discharge, sinus congestion and sore throat.  Cardiovascular: Denied cardiovascular symptoms, arrhythmia, chest pain/pressure, edema, exercise intolerance, orthopnea and palpitations.  Respiratory: Denied pulmonary symptoms, asthma, pleuritic pain, productive sputum, cough, dyspnea and wheezing.  Gastrointestinal: Denied, gastro-esophageal reflux, melena, nausea and vomiting.  Genitourinary: Denied genitourinary symptoms including symptomatic vaginal discharge, pelvic relaxation issues, and urinary complaints.  Musculoskeletal: Denied musculoskeletal symptoms, stiffness, swelling, muscle weakness and myalgia.  Dermatologic: Denied dermatology symptoms, rash and scar.  Neurologic: Denied neurology symptoms, dizziness, headache, neck pain and syncope.  Psychiatric: Denied psychiatric symptoms, anxiety and depression.  Endocrine: Denied endocrine symptoms including hot flashes and night sweats.   Meds:   Current Outpatient Medications on File Prior to Visit  Medication Sig Dispense Refill   ALPRAZolam  (XANAX ) 0.25 MG tablet Take half tab twice a day as needed for severe panic attacks 30 tablet 0   apixaban  (ELIQUIS ) 5 MG TABS tablet Take 1 tablet (5 mg total) by mouth 2 (two) times daily. 60 tablet 11   diltiazem  (CARDIZEM  CD) 180 MG 24 hr capsule Take 1 capsule (180 mg total) by mouth daily. 90  capsule 1   escitalopram  (LEXAPRO ) 10 MG tablet TAKE 1  TABLET BY MOUTH ONCE DAILY WITH  SUPPER  FOR  PANIC  ATTACKS 90 tablet 2   famotidine  (PEPCID ) 10 MG tablet Take 1 tablet (10 mg total) by mouth daily. 90 tablet 1   loratadine  (CLARITIN ) 10 MG tablet Take 10 mg by mouth daily.     Multiple Vitamin (MULTIVITAMIN) tablet Take 1 tablet by mouth daily.     mupirocin  ointment (BACTROBAN ) 2 % Apply 1 Application topically 2 (two) times daily. To affected area until healed. 30 g 2   omeprazole  (PRILOSEC) 40 MG capsule TAKE 1 CAPSULE BY MOUTH TWICE DAILY FOR HEARTBURN**MAX REACHED ONLY 30 CAPS ALLOWED 30 capsule 5   PANTOPRAZOLE  SODIUM PO Take 20 mg by mouth daily. Daily for 20 days     No current facility-administered medications on file prior to visit.      Objective:     Vitals:   07/06/23 1103 07/06/23 1109  BP: (!) 148/84 129/88  Pulse: 63    Filed Weights   07/06/23 1103  Weight: 295 lb 3.2 oz (133.9 kg)              Abdominal examination-mass is not distinctly palpable.  Exam limited by patient body habitus          Assessment:    [id number] Patient Active Problem List   Diagnosis Date Noted   Nocturnal hypoxia 02/14/2023   Acute saddle pulmonary embolism (HCC) 10/01/2022   Anxiety and depression 10/01/2022   Pulmonary embolism (HCC) 10/01/2022   Screen for colon cancer    Polyp of transverse colon    Gastroesophageal reflux disease without esophagitis    OSA on CPAP 09/16/2020   Other hypersomnia 09/16/2020   Encounter for general adult medical examination with abnormal findings 06/19/2019   Encounter for screening mammogram for malignant neoplasm of breast 06/19/2019   Pulmonary hypertension (HCC)    Shortness of breath    Pulmonary hypertension, primary (HCC) 01/04/2019   Acute pain of left shoulder 11/23/2018   Cardiomegaly 11/23/2018   Abnormal ECG 11/23/2018   Acquired hypothyroidism 05/22/2018   Seasonal allergies 05/22/2018   Acute anxiety 05/22/2018    Dysuria 05/22/2018   Hypertension 01/05/2018   Routine cervical smear 01/05/2018   Elevated ferritin 11/26/2015   Thrombocytosis 11/26/2015     1. Fibroids     Relatively asymptomatic although they are quite large.   Plan:            1.  We have discussed options for her uterine fibroids.  I believe she really only has 2 reasonable options.  These are: Abdominal hysterectomy or expectant management.  As she has significant risk for surgery because of her previous DVT PE and obesity her likely best option is expectant management.  (She is asymptomatic!) we have specifically discussed abdominal hysterectomy if she so desires this option.  Follow-up ultrasounds in the future to make sure the uterine fibroids are not significantly growing has been discussed.  All questions were answered and she and her family will consider her options.  Orders No orders of the defined types were placed in this encounter.   No orders of the defined types were placed in this encounter.     F/U  No follow-ups on file.  Delice Felt, M.D. 07/06/2023 11:30 AM

## 2023-07-06 NOTE — Progress Notes (Signed)
 Patient presents today to discuss surgical options due to multiple fibroids. She states occasional pain on her left side but typically no pain. Unsure of what route she would like to take, wants to discuss all options.

## 2023-07-09 DIAGNOSIS — M25562 Pain in left knee: Secondary | ICD-10-CM | POA: Diagnosis not present

## 2023-07-13 DIAGNOSIS — I270 Primary pulmonary hypertension: Secondary | ICD-10-CM | POA: Diagnosis not present

## 2023-07-15 ENCOUNTER — Other Ambulatory Visit: Payer: Self-pay | Admitting: Physician Assistant

## 2023-07-15 DIAGNOSIS — M150 Primary generalized (osteo)arthritis: Secondary | ICD-10-CM

## 2023-07-23 ENCOUNTER — Ambulatory Visit (INDEPENDENT_AMBULATORY_CARE_PROVIDER_SITE_OTHER): Admitting: Student

## 2023-07-23 ENCOUNTER — Encounter (HOSPITAL_BASED_OUTPATIENT_CLINIC_OR_DEPARTMENT_OTHER): Payer: Self-pay | Admitting: Student

## 2023-07-23 ENCOUNTER — Ambulatory Visit (INDEPENDENT_AMBULATORY_CARE_PROVIDER_SITE_OTHER)

## 2023-07-23 ENCOUNTER — Telehealth: Payer: Self-pay

## 2023-07-23 ENCOUNTER — Other Ambulatory Visit: Payer: Self-pay | Admitting: Physician Assistant

## 2023-07-23 DIAGNOSIS — E782 Mixed hyperlipidemia: Secondary | ICD-10-CM

## 2023-07-23 DIAGNOSIS — E559 Vitamin D deficiency, unspecified: Secondary | ICD-10-CM

## 2023-07-23 DIAGNOSIS — R946 Abnormal results of thyroid function studies: Secondary | ICD-10-CM

## 2023-07-23 DIAGNOSIS — R5383 Other fatigue: Secondary | ICD-10-CM

## 2023-07-23 DIAGNOSIS — M1712 Unilateral primary osteoarthritis, left knee: Secondary | ICD-10-CM | POA: Diagnosis not present

## 2023-07-23 DIAGNOSIS — M25562 Pain in left knee: Secondary | ICD-10-CM

## 2023-07-23 DIAGNOSIS — E538 Deficiency of other specified B group vitamins: Secondary | ICD-10-CM

## 2023-07-23 MED ORDER — LIDOCAINE HCL 1 % IJ SOLN
4.0000 mL | INTRAMUSCULAR | Status: AC | PRN
Start: 2023-07-23 — End: 2023-07-23
  Administered 2023-07-23: 4 mL

## 2023-07-23 MED ORDER — TRIAMCINOLONE ACETONIDE 40 MG/ML IJ SUSP
2.0000 mL | INTRAMUSCULAR | Status: AC | PRN
  Administered 2023-07-23: 2 mL via INTRA_ARTICULAR

## 2023-07-23 NOTE — Progress Notes (Signed)
 Chief Complaint: Left knee pain     History of Present Illness:    Shelley [name] is a 63 y.o. female presenting to clinic today for evaluation of left knee pain.  Patient reports that this noticeably began about a month and a half ago, however has been worsening over the last 2 weeks.  Denies any recent injury.  Pain is located mainly over the medial aspect of the knee and rates as moderate to severe.  She has been using an Ace wrap as well as tramadol  and Tylenol .  Was recently taking prednisone  which she finished over a week ago however pain immediately returned after completing this.  She is on anticoagulation with Eliquis  and cannot take NSAIDs.  No history of diabetes.   Surgical History:   None  PMH/PSH/Family History/Social History/Meds/Allergies:    Past Medical History:  Diagnosis Date   Anemia    Anxiety    Arthritis    Cardiomegaly    Family history of breast cancer    Family history of ovarian cancer    5/22 cancer genetic testing letter sent   Hypertension    Hyperthyroidism    Pulmonary hypertension (HCC)    Sleep apnea    Thyroid  disease    Vertigo    Past Surgical History:  Procedure Laterality Date   BREAST BIOPSY Right 11/20/2022   us  bx,  marker, path pending   BREAST BIOPSY Right 11/20/2022   US  RT BREAST BX W LOC DEV 1ST LESION IMG BX SPEC US  GUIDE 11/20/2022 [facility]   COLONOSCOPY WITH PROPOFOL  N/A 02/27/2021   Procedure: COLONOSCOPY WITH PROPOFOL ;  Surgeon: Marnee Sink, MD;  Location: [facility] ENDOSCOPY;  Service: Endoscopy;  Laterality: N/A;   ESOPHAGOGASTRODUODENOSCOPY  02/27/2021   Procedure: ESOPHAGOGASTRODUODENOSCOPY (EGD);  Surgeon: Marnee Sink, MD;  Location: [facility] ENDOSCOPY;  Service: Endoscopy;;   IVC FILTER INSERTION N/A 10/02/2022   Procedure: IVC FILTER INSERTION;  Surgeon: Celso College, MD;  Location: [facility] INVASIVE CV LAB;  Service: Cardiovascular;  Laterality: N/A;   PULMONARY THROMBECTOMY N/A  10/02/2022   Procedure: PULMONARY THROMBECTOMY;  Surgeon: Celso College, MD;  Location: [facility] INVASIVE CV LAB;  Service: Cardiovascular;  Laterality: N/A;   RIGHT/LEFT HEART CATH AND CORONARY ANGIOGRAPHY Bilateral 05/26/2019   Procedure: RIGHT/LEFT HEART CATH AND CORONARY ANGIOGRAPHY;  Surgeon: Sammy Crisp, MD;  Location: [facility] INVASIVE CV LAB;  Service: Cardiovascular;  Laterality: Bilateral;   TUBAL LIGATION     Social History   Socioeconomic History   Marital status: Single    Spouse name: Not on file   Number of children: Not on file   Years of education: Not on file   Highest education level: Not on file  Occupational History   Not on file  Tobacco Use   Smoking status: Never    Passive exposure: Never   Smokeless tobacco: Never  Vaping Use   Vaping status: Never Used  Substance and Sexual Activity   Alcohol use: No   Drug use: No   Sexual activity: Not Currently    Birth control/protection: Surgical    Comment: btl  Other Topics Concern   Not on file  Social History Narrative   Not on file   Social Drivers of Health   Financial Resource Strain: Low Risk  (07/09/2023)   Received from [facility]   Overall Financial Resource Strain (CARDIA)    Difficulty of Paying Living Expenses: Not hard at all  Food Insecurity: No Food Insecurity (07/09/2023)   [facility]   Hunger Vital Sign    Worried About Running Out of Food in the Last Year: Never true    Ran Out of Food in the Last Year: Never true  Transportation Needs: No Transportation Needs (07/09/2023)   Received from [facility] - Transportation    In the past 12 months, has lack of transportation kept you from medical appointments or from getting medications?: No    Lack of Transportation (Non-Medical): No  Physical Activity: Not on file  Stress: Not on file  Social Connections: Not on file   Family History  Problem Relation Age of Onset    Heart attack Mother    Ovarian cancer Mother    Cancer Father    Cancer Sister    Breast cancer Maternal Grandmother    Breast cancer Cousin        1st maternal cousin   Cancer Other    Allergies  Allergen Reactions   Amlodipine     Keflex  [Cephalexin ]     Abdominal pain   Current Outpatient Medications  Medication Sig Dispense Refill   ALPRAZolam  (XANAX ) 0.25 MG tablet Take half tab twice a day as needed for severe panic attacks 30 tablet 0   apixaban  (ELIQUIS ) 5 MG TABS tablet Take 1 tablet (5 mg total) by mouth 2 (two) times daily. 60 tablet 11   diltiazem  (CARDIZEM  CD) 180 MG 24 hr capsule Take 1 capsule (180 mg total) by mouth daily. 90 capsule 1   escitalopram  (LEXAPRO ) 10 MG tablet TAKE 1 TABLET BY MOUTH ONCE DAILY WITH  SUPPER  FOR  PANIC  ATTACKS 90 tablet 2   famotidine  (PEPCID ) 10 MG tablet Take 1 tablet (10 mg total) by mouth daily. 90 tablet 1   loratadine  (CLARITIN ) 10 MG tablet Take 10 mg by mouth daily.     Multiple Vitamin (MULTIVITAMIN) tablet Take 1 tablet by mouth daily.     mupirocin  ointment (BACTROBAN ) 2 % Apply 1 Application topically 2 (two) times daily. To affected area until healed. 30 g 2   omeprazole  (PRILOSEC) 40 MG capsule TAKE 1 CAPSULE BY MOUTH TWICE DAILY FOR HEARTBURN**MAX REACHED ONLY 30 CAPS ALLOWED 30 capsule 5   PANTOPRAZOLE  SODIUM PO Take 20 mg by mouth daily. Daily for 20 days     No current facility-administered medications for this visit.   No results found.  Review of Systems:   A ROS was performed including pertinent positives and negatives as documented in the HPI.  Physical Exam :   Constitutional: NAD and appears stated age Neurological: Alert and oriented Psych: Appropriate affect and cooperative There were no vitals taken for this visit.   Comprehensive Musculoskeletal Exam:    Exam of left knee demonstrates bilateral joint line tenderness, worse medially.  Active range of motion from 5 to 90 degrees with positive  crepitus.  Knee flexion and extension strength is 5/5.  No laxity with varus or valgus stress.  No significant effusion present.  Imaging:   Xray (left knee 4 views): Advanced tricompartmental osteoarthritis that appears bone-on-bone within the medial compartment.  Diffuse osteophytes noted.   I personally reviewed and interpreted the radiographs.   Assessment:   63 y.o. female with worsening left knee pain over the last 2 weeks.  She  does have known osteoarthritis in both knees and x-rays today do show advanced degenerative changes with throughout the left knee that is bone-on-bone within the medial compartment.  She has received cortisone injections in the past in both knees, and the last of the left knee was 2 to 3 years ago.  This has historically provided some relief so I have offered to repeat this today.  Cortisone injection was performed of the left knee after patient consent and she tolerated this very well.  Will plan to have her follow-up as needed.  Plan :    - Left knee cortisone injection performed today after patient consent - Return to clinic as needed     Procedure Note  Patient: SHARECE SMALLS             Date of Birth: 1960-09-25           MRN: [id number]             Visit Date: 07/23/2023  Procedures: Visit Diagnoses:  1. Unilateral primary osteoarthritis, left knee     Large Joint Inj: L knee on 07/23/2023 11:36 AM Indications: pain Details: 22 G 1.5 in needle, anterolateral approach Medications: 4 mL lidocaine  1 %; 2 mL triamcinolone acetonide 40 MG/ML Outcome: tolerated well, no immediate complications Procedure, treatment alternatives, risks and benefits explained, specific risks discussed. Consent was given by the patient. Immediately prior to procedure a time out was called to verify the correct patient, procedure, equipment, support staff and site/side marked as required. Patient was prepped and draped in the usual sterile fashion.       I  personally saw and evaluated the patient, and participated in the management and treatment plan.  Sharrell Deck, PA-C Orthopedics

## 2023-07-23 NOTE — Telephone Encounter (Signed)
 Patient notified

## 2023-07-26 DIAGNOSIS — E538 Deficiency of other specified B group vitamins: Secondary | ICD-10-CM | POA: Diagnosis not present

## 2023-07-26 DIAGNOSIS — E782 Mixed hyperlipidemia: Secondary | ICD-10-CM | POA: Diagnosis not present

## 2023-07-26 DIAGNOSIS — E559 Vitamin D deficiency, unspecified: Secondary | ICD-10-CM | POA: Diagnosis not present

## 2023-07-26 DIAGNOSIS — R5383 Other fatigue: Secondary | ICD-10-CM | POA: Diagnosis not present

## 2023-07-26 DIAGNOSIS — R946 Abnormal results of thyroid function studies: Secondary | ICD-10-CM | POA: Diagnosis not present

## 2023-07-27 ENCOUNTER — Encounter (INDEPENDENT_AMBULATORY_CARE_PROVIDER_SITE_OTHER): Payer: Self-pay

## 2023-07-27 LAB — CBC WITH DIFFERENTIAL/PLATELET
Basophils Absolute: 0 10*3/uL (ref 0.0–0.2)
Basos: 0 %
EOS (ABSOLUTE): 0 10*3/uL (ref 0.0–0.4)
Eos: 0 %
Hematocrit: 40.8 % (ref 34.0–46.6)
Hemoglobin: 13.3 g/dL (ref 11.1–15.9)
Immature Grans (Abs): 0 10*3/uL (ref 0.0–0.1)
Immature Granulocytes: 0 %
Lymphocytes Absolute: 2.1 10*3/uL (ref 0.7–3.1)
Lymphs: 16 %
MCH: 28.4 pg (ref 26.6–33.0)
MCHC: 32.6 g/dL (ref 31.5–35.7)
MCV: 87 fL (ref 79–97)
Monocytes Absolute: 0.7 10*3/uL (ref 0.1–0.9)
Monocytes: 5 %
Neutrophils Absolute: 10.3 10*3/uL — ABNORMAL HIGH (ref 1.4–7.0)
Neutrophils: 79 %
Platelets: 419 10*3/uL (ref 150–450)
RBC: 4.69 x10E6/uL (ref 3.77–5.28)
RDW: 14.3 % (ref 11.7–15.4)
WBC: 13.1 10*3/uL — ABNORMAL HIGH (ref 3.4–10.8)

## 2023-07-27 LAB — COMPREHENSIVE METABOLIC PANEL WITH GFR
ALT: 16 IU/L (ref 0–32)
AST: 12 IU/L (ref 0–40)
Albumin: 4.1 g/dL (ref 3.9–4.9)
Alkaline Phosphatase: 64 IU/L (ref 44–121)
BUN/Creatinine Ratio: 27 (ref 12–28)
BUN: 22 mg/dL (ref 8–27)
Bilirubin Total: 0.3 mg/dL (ref 0.0–1.2)
CO2: 19 mmol/L — ABNORMAL LOW (ref 20–29)
Calcium: 8.9 mg/dL (ref 8.7–10.3)
Chloride: 105 mmol/L (ref 96–106)
Creatinine, Ser: 0.83 mg/dL (ref 0.57–1.00)
Globulin, Total: 2.7 g/dL (ref 1.5–4.5)
Glucose: 93 mg/dL (ref 70–99)
Potassium: 4.4 mmol/L (ref 3.5–5.2)
Sodium: 135 mmol/L (ref 134–144)
Total Protein: 6.8 g/dL (ref 6.0–8.5)
eGFR: 79 mL/min/{1.73_m2} (ref >59)

## 2023-07-27 LAB — LIPID PANEL WITH LDL/HDL RATIO
Cholesterol, Total: 154 mg/dL (ref 100–199)
HDL: 43 mg/dL (ref >39)
LDL Chol Calc (NIH): 92 mg/dL (ref 0–99)
LDL/HDL Ratio: 2.1 ratio (ref 0.0–3.2)
Triglycerides: 105 mg/dL (ref 0–149)
VLDL Cholesterol Cal: 19 mg/dL (ref 5–40)

## 2023-07-27 LAB — TSH+FREE T4
Free T4: 0.95 ng/dL (ref 0.82–1.77)
TSH: 0.286 u[IU]/mL — ABNORMAL LOW (ref 0.450–4.500)

## 2023-07-27 LAB — IRON,TIBC AND FERRITIN PANEL
Ferritin: 92 ng/mL (ref 15–150)
Iron Saturation: 31 % (ref 15–55)
Iron: 77 ug/dL (ref 27–139)
Total Iron Binding Capacity: 245 ug/dL — ABNORMAL LOW (ref 250–450)
UIBC: 168 ug/dL (ref 118–369)

## 2023-07-27 LAB — B12 AND FOLATE PANEL
Folate: 8.5 ng/mL (ref >3.0)
Vitamin B-12: 408 pg/mL (ref 232–1245)

## 2023-07-27 LAB — VITAMIN D 25 HYDROXY (VIT D DEFICIENCY, FRACTURES): Vit D, 25-Hydroxy: 31.6 ng/mL (ref 30.0–100.0)

## 2023-07-29 ENCOUNTER — Ambulatory Visit (INDEPENDENT_AMBULATORY_CARE_PROVIDER_SITE_OTHER): Payer: 59 | Admitting: Physician Assistant

## 2023-07-29 ENCOUNTER — Encounter: Payer: Self-pay | Admitting: Physician Assistant

## 2023-07-29 VITALS — BP 142/80 | HR 94 | Temp 98.3°F | Resp 16 | Ht 66.0 in | Wt 291.0 lb

## 2023-07-29 DIAGNOSIS — F411 Generalized anxiety disorder: Secondary | ICD-10-CM

## 2023-07-29 DIAGNOSIS — M150 Primary generalized (osteo)arthritis: Secondary | ICD-10-CM

## 2023-07-29 DIAGNOSIS — Z0001 Encounter for general adult medical examination with abnormal findings: Secondary | ICD-10-CM

## 2023-07-29 DIAGNOSIS — E041 Nontoxic single thyroid nodule: Secondary | ICD-10-CM

## 2023-07-29 DIAGNOSIS — D72829 Elevated white blood cell count, unspecified: Secondary | ICD-10-CM | POA: Diagnosis not present

## 2023-07-29 DIAGNOSIS — R7989 Other specified abnormal findings of blood chemistry: Secondary | ICD-10-CM

## 2023-07-29 DIAGNOSIS — I10 Essential (primary) hypertension: Secondary | ICD-10-CM

## 2023-07-29 MED ORDER — TRAMADOL HCL 50 MG PO TABS: 50.0000 mg | ORAL_TABLET | Freq: Every day | ORAL | 0 refills | Status: DC | PRN

## 2023-07-29 NOTE — Progress Notes (Signed)
 [facility] [address] [place], [place] [address]  Internal MEDICINE  Office Visit Note  Patient Name: Shelley [name]  [id number]  [id number]  Date of Service: 07/29/2023  Chief Complaint  Patient presents with   Annual Exam   Hypertension     HPI Pt is here for routine health maintenance examination -Lexapro  is helping anxiety, only using xanax  as needed -little headache today which may be related to BP as it is borderline and she will start monitoring at home again. -labs reviewed-wbc elevated, some hx of elevated WBC.Aaron Aaswill recheck may be chronic? -TSH low, has been off thyroid  medication for awhile, used to see Dr. Moriayati until he retired. Was followed for thyroid  nodules and has had prev bx before. Will upated US . May need endo -vit D low and will supplement -OBGYN are going to monitor, they want to avoid surgery due to blood thinner. Goes back to vasc in Sept--states she is suppsoed to be stopping at 1 year and will see if this is the case -ortho doing shots in knee and is helping some but will have some pain at times still. Does request tramadol  prn -mammogram abnormal last year and had biopsy --thinks q6 months and is due now then. She will call to follow up on this -declines vaccines  Current Medication: Outpatient Encounter Medications as of 07/29/2023  Medication Sig   ALPRAZolam  (XANAX ) 0.25 MG tablet Take half tab twice a day as needed for severe panic attacks   apixaban  (ELIQUIS ) 5 MG TABS tablet Take 1 tablet (5 mg total) by mouth 2 (two) times daily.   diltiazem  (CARDIZEM  CD) 180 MG 24 hr capsule Take 1 capsule (180 mg total) by mouth daily.   escitalopram  (LEXAPRO ) 10 MG tablet TAKE 1 TABLET BY MOUTH ONCE DAILY WITH  SUPPER  FOR  PANIC  ATTACKS   famotidine  (PEPCID ) 10 MG tablet Take 1 tablet (10 mg total) by mouth daily.   loratadine  (CLARITIN ) 10 MG tablet Take 10 mg by mouth daily.   Multiple Vitamin (MULTIVITAMIN) tablet Take 1 tablet by  mouth daily.   mupirocin  ointment (BACTROBAN ) 2 % Apply 1 Application topically 2 (two) times daily. To affected area until healed.   omeprazole  (PRILOSEC) 40 MG capsule TAKE 1 CAPSULE BY MOUTH TWICE DAILY FOR HEARTBURN**MAX REACHED ONLY 30 CAPS ALLOWED   PANTOPRAZOLE  SODIUM PO Take 20 mg by mouth daily. Daily for 20 days   [EXPIRED] traMADol  (ULTRAM ) 50 MG tablet Take 1 tablet (50 mg total) by mouth daily as needed for up to 5 days.   No facility-administered encounter medications on file as of 07/29/2023.    Surgical History: Past Surgical History:  Procedure Laterality Date   BREAST BIOPSY Right 11/20/2022   us  bx,  marker, path pending   BREAST BIOPSY Right 11/20/2022   US  RT BREAST BX W LOC DEV 1ST LESION IMG BX SPEC US  GUIDE 11/20/2022 [facility]   COLONOSCOPY WITH PROPOFOL  N/A 02/27/2021   Procedure: COLONOSCOPY WITH PROPOFOL ;  Surgeon: Marnee Sink, MD;  Location: [facility] ENDOSCOPY;  Service: Endoscopy;  Laterality: N/A;   ESOPHAGOGASTRODUODENOSCOPY  02/27/2021   Procedure: ESOPHAGOGASTRODUODENOSCOPY (EGD);  Surgeon: Marnee Sink, MD;  Location: [facility] ENDOSCOPY;  Service: Endoscopy;;   IVC FILTER INSERTION N/A 10/02/2022   Procedure: IVC FILTER INSERTION;  Surgeon: Celso College, MD;  Location: [facility] INVASIVE CV LAB;  Service: Cardiovascular;  Laterality: N/A;   PULMONARY THROMBECTOMY N/A 10/02/2022   Procedure: PULMONARY THROMBECTOMY;  Surgeon: Celso College, MD;  Location:  [facility] INVASIVE CV LAB;  Service: Cardiovascular;  Laterality: N/A;   RIGHT/LEFT HEART CATH AND CORONARY ANGIOGRAPHY Bilateral 05/26/2019   Procedure: RIGHT/LEFT HEART CATH AND CORONARY ANGIOGRAPHY;  Surgeon: Sammy Crisp, MD;  Location: [facility] INVASIVE CV LAB;  Service: Cardiovascular;  Laterality: Bilateral;   TUBAL LIGATION      Medical History: Past Medical History:  Diagnosis Date   Anemia    Anxiety    Arthritis    Cardiomegaly    Family history of breast cancer    Family history of ovarian cancer     5/22 cancer genetic testing letter sent   Hypertension    Hyperthyroidism    Pulmonary hypertension (HCC)    Sleep apnea    Thyroid  disease    Vertigo     Family History: Family History  Problem Relation Age of Onset   Heart attack Mother    Ovarian cancer Mother    Cancer Father    Cancer Sister    Breast cancer Maternal Grandmother    Breast cancer Cousin        1st maternal cousin   Cancer Other       Review of Systems  Constitutional:  Negative for chills, fatigue and unexpected weight change.  HENT: Negative.  Negative for congestion, rhinorrhea, sneezing and sore throat.   Eyes:  Negative for redness.  Respiratory: Negative.  Negative for cough, chest tightness and shortness of breath.   Cardiovascular: Negative.  Negative for chest pain and palpitations.  Gastrointestinal:  Negative for constipation, diarrhea, nausea and vomiting.       Reflux  Genitourinary:  Negative for dysuria and frequency.  Musculoskeletal:  Positive for arthralgias. Negative for back pain, joint swelling and neck pain.  Skin:  Negative for rash.  Neurological: Negative.  Negative for tremors and numbness.  Hematological:  Negative for adenopathy. Does not bruise/bleed easily.  Psychiatric/Behavioral:  Negative for behavioral problems (Depression) and suicidal ideas. The patient is nervous/anxious.      Vital Signs: BP (!) 142/80 Comment: 1484/77  Pulse 94   Temp 98.3 F (36.8 C)   Resp 16   Ht 5' 6 (1.676 m)   Wt 291 lb (132 kg)   LMP  (LMP Unknown)   SpO2 98%   BMI 46.97 kg/m    Physical Exam Vitals and nursing note reviewed.  Constitutional:      Appearance: Normal appearance.  HENT:     Head: Normocephalic and atraumatic.     Nose: Nose normal.     Mouth/Throat:     Mouth: Mucous membranes are moist.     Pharynx: No posterior oropharyngeal erythema.  Eyes:     Extraocular Movements: Extraocular movements intact.     Pupils: Pupils are equal, round, and reactive  to light.  Cardiovascular:     Rate and Rhythm: Normal rate and regular rhythm.     Pulses: Normal pulses.     Heart sounds: Normal heart sounds.  Pulmonary:     Effort: Pulmonary effort is normal.     Breath sounds: Normal breath sounds.  Abdominal:     General: Bowel sounds are normal.     Tenderness: There is no abdominal tenderness.  Skin:    General: Skin is warm and dry.  Neurological:     General: No focal deficit present.     Mental Status: She is alert.  Psychiatric:        Mood and Affect: Mood normal.        Behavior:  Behavior normal.      LABS: Recent Results (from the past 2160 hours)  CBC w/Diff/Platelet     Status: Abnormal   Collection Time: 07/26/23  8:18 AM  Result Value Ref Range   WBC 13.1 (H) 3.4 - 10.8 x10E3/uL   RBC 4.69 3.77 - 5.28 x10E6/uL   Hemoglobin 13.3 11.1 - 15.9 g/dL   Hematocrit 16.1 09.6 - 46.6 %   MCV 87 79 - 97 fL   MCH 28.4 26.6 - 33.0 pg   MCHC 32.6 31.5 - 35.7 g/dL   RDW 04.5 40.9 - 81.1 %   Platelets 419 150 - 450 x10E3/uL   Neutrophils 79 Not Estab. %   Lymphs 16 Not Estab. %   Monocytes 5 Not Estab. %   Eos 0 Not Estab. %   Basos 0 Not Estab. %   Neutrophils Absolute 10.3 (H) 1.4 - 7.0 x10E3/uL   Lymphocytes Absolute 2.1 0.7 - 3.1 x10E3/uL   Monocytes Absolute 0.7 0.1 - 0.9 x10E3/uL   EOS (ABSOLUTE) 0.0 0.0 - 0.4 x10E3/uL   Basophils Absolute 0.0 0.0 - 0.2 x10E3/uL   Immature Granulocytes 0 Not Estab. %   Immature Grans (Abs) 0.0 0.0 - 0.1 x10E3/uL  Comprehensive metabolic panel with GFR     Status: Abnormal   Collection Time: 07/26/23  8:18 AM  Result Value Ref Range   Glucose 93 70 - 99 mg/dL   BUN 22 8 - 27 mg/dL   Creatinine, Ser 9.14 0.57 - 1.00 mg/dL   eGFR 79 >78 GN/FAO/1.30   BUN/Creatinine Ratio 27 12 - 28   Sodium 135 134 - 144 mmol/L   Potassium 4.4 3.5 - 5.2 mmol/L   Chloride 105 96 - 106 mmol/L   CO2 19 (L) 20 - 29 mmol/L   Calcium 8.9 8.7 - 10.3 mg/dL   Total Protein 6.8 6.0 - 8.5 g/dL   Albumin 4.1  3.9 - 4.9 g/dL   Globulin, Total 2.7 1.5 - 4.5 g/dL   Bilirubin Total 0.3 0.0 - 1.2 mg/dL   Alkaline Phosphatase 64 44 - 121 IU/L   AST 12 0 - 40 IU/L   ALT 16 0 - 32 IU/L  TSH + free T4     Status: Abnormal   Collection Time: 07/26/23  8:18 AM  Result Value Ref Range   TSH 0.286 (L) 0.450 - 4.500 uIU/mL   Free T4 0.95 0.82 - 1.77 ng/dL  Lipid Panel With LDL/HDL Ratio     Status: None   Collection Time: 07/26/23  8:18 AM  Result Value Ref Range   Cholesterol, Total 154 100 - 199 mg/dL   Triglycerides 865 0 - 149 mg/dL   HDL 43 >78 mg/dL   VLDL Cholesterol Cal 19 5 - 40 mg/dL   LDL Chol Calc (NIH) 92 0 - 99 mg/dL   LDL/HDL Ratio 2.1 0.0 - 3.2 ratio    Comment:                                     LDL/HDL Ratio                                             Men  Women  1/2 Avg.Risk  1.0    1.5                                   Avg.Risk  3.6    3.2                                2X Avg.Risk  6.2    5.0                                3X Avg.Risk  8.0    6.1   VITAMIN D  25 Hydroxy (Vit-D Deficiency, Fractures)     Status: None   Collection Time: 07/26/23  8:18 AM  Result Value Ref Range   Vit D, 25-Hydroxy 31.6 30.0 - 100.0 ng/mL    Comment: Vitamin D  deficiency has been defined by the Institute of Medicine and an Endocrine Society practice guideline as a level of serum 25-OH vitamin D  less than 20 ng/mL (1,2). The Endocrine Society went on to further define vitamin D  insufficiency as a level between 21 and 29 ng/mL (2). 1. IOM (Institute of Medicine). 2010. Dietary reference    intakes for calcium and D. [place]: The    [employer]. 2. Holick MF, Binkley Klemme, Bischoff-Ferrari HA, et al.    Evaluation, treatment, and prevention of vitamin D     deficiency: an Endocrine Society clinical practice    guideline. JCEM. 2011 Jul; 96(7):1911-30.   B12 and Folate Panel     Status: None   Collection Time: 07/26/23  8:18 AM  Result Value Ref  Range   Vitamin B-12 408 232 - 1,245 pg/mL   Folate 8.5 >3.0 ng/mL    Comment: A serum folate concentration of less than 3.1 ng/mL is considered to represent clinical deficiency.   Fe+TIBC+Fer     Status: Abnormal   Collection Time: 07/26/23  8:18 AM  Result Value Ref Range   Total Iron Binding Capacity 245 (L) 250 - 450 ug/dL   UIBC 366 440 - 347 ug/dL   Iron 77 27 - 425 ug/dL   Iron Saturation 31 15 - 55 %   Ferritin 92 15 - 150 ng/mL        Assessment/Plan: 1. Encounter for general adult medical examination with abnormal findings (Primary) CPE performed, labs reviewed, will call to follow up on mammogram, declines vaccines  2. Essential hypertension Borderline in office and will monitor closely  3. Low TSH level Will check US  - US  THYROID ; Future  4. Thyroid  nodule Will check US  - US  THYROID ; Future  5. Leukocytosis, unspecified type Will recheck labs - CBC w/Diff/Platelet  6. GAD (generalized anxiety disorder) Improved, continue lexapro   7. Primary generalized (osteo)arthritis May use tramadol  prn, followed by ortho for injections   General Counseling: Stephaine verbalizes understanding of the findings of todays visit and agrees with plan of treatment. I have discussed any further diagnostic evaluation that may be needed or ordered today. We also reviewed her medications today. she has been encouraged to call the office with any questions or concerns that should arise related to todays visit.    Counseling:    Orders Placed This Encounter  Procedures   US  THYROID    CBC w/Diff/Platelet    Meds ordered this encounter  Medications   traMADol  (ULTRAM )  50 MG tablet    Sig: Take 1 tablet (50 mg total) by mouth daily as needed for up to 5 days.    Dispense:  30 tablet    Refill:  0    This patient was seen by Taylor Favia, PA-C in collaboration with Dr. Verneta Gone as a part of collaborative care agreement.  Total time spent:35 Minutes  Time  spent includes review of chart, medications, test results, and follow up plan with the patient.     Lawton Price, MD  Internal Medicine

## 2023-07-30 ENCOUNTER — Telehealth: Payer: Self-pay | Admitting: Physician Assistant

## 2023-07-30 NOTE — Telephone Encounter (Signed)
 Notified patient of U/S appointment date, arrival time, location-Toni

## 2023-08-10 ENCOUNTER — Ambulatory Visit: Payer: Self-pay | Admitting: Physician Assistant

## 2023-08-13 ENCOUNTER — Ambulatory Visit

## 2023-08-13 DIAGNOSIS — I270 Primary pulmonary hypertension: Secondary | ICD-10-CM | POA: Diagnosis not present

## 2023-08-16 ENCOUNTER — Other Ambulatory Visit: Payer: Self-pay | Admitting: Physician Assistant

## 2023-08-20 ENCOUNTER — Ambulatory Visit
Admission: RE | Admit: 2023-08-20 | Discharge: 2023-08-20 | Disposition: A | Discharge status: Home / Self Care | Source: Ambulatory Visit | Attending: Physician Assistant

## 2023-08-20 DIAGNOSIS — E042 Nontoxic multinodular goiter: Secondary | ICD-10-CM | POA: Diagnosis not present

## 2023-08-20 DIAGNOSIS — E041 Nontoxic single thyroid nodule: Secondary | ICD-10-CM | POA: Diagnosis not present

## 2023-08-20 DIAGNOSIS — R7989 Other specified abnormal findings of blood chemistry: Secondary | ICD-10-CM | POA: Insufficient documentation

## 2023-09-03 ENCOUNTER — Encounter: Payer: Self-pay | Admitting: Physician Assistant

## 2023-09-03 ENCOUNTER — Ambulatory Visit (INDEPENDENT_AMBULATORY_CARE_PROVIDER_SITE_OTHER): Admitting: Physician Assistant

## 2023-09-03 VITALS — BP 132/88 | HR 86 | Temp 98.5°F | Resp 16 | Ht 66.0 in | Wt 292.8 lb

## 2023-09-03 DIAGNOSIS — R7989 Other specified abnormal findings of blood chemistry: Secondary | ICD-10-CM

## 2023-09-03 DIAGNOSIS — M150 Primary generalized (osteo)arthritis: Secondary | ICD-10-CM

## 2023-09-03 DIAGNOSIS — D72829 Elevated white blood cell count, unspecified: Secondary | ICD-10-CM | POA: Diagnosis not present

## 2023-09-03 DIAGNOSIS — E041 Nontoxic single thyroid nodule: Secondary | ICD-10-CM | POA: Diagnosis not present

## 2023-09-03 NOTE — Progress Notes (Signed)
 [facility] [address] [place], [place] [address]  Internal MEDICINE  Office Visit Note  Patient Name: Shelley [name]  [id number]  [id number]  Date of Service: 09/23/2023  Chief Complaint  Patient presents with   Follow-up    Review thyroid  U/S   Hypertension    HPI Pt is here for routine follow up -Will schedule injection for right knee, left doing better now but right is bothersome and still uses tramadol  due to this. -reviewed thyroid  US : 1.5cm left inferior thyroid  TR 3 type nodule which meets criteria for follow up scan in 1 year -will recheck thyroid  labs in addition to CBC previously ordered for recheck due to high WBC  Current Medication: Outpatient Encounter Medications as of 09/03/2023  Medication Sig   ALPRAZolam  (XANAX ) 0.25 MG tablet Take half tab twice a day as needed for severe panic attacks   apixaban  (ELIQUIS ) 5 MG TABS tablet Take 1 tablet (5 mg total) by mouth 2 (two) times daily.   diltiazem  (CARDIZEM  CD) 180 MG 24 hr capsule Take 1 capsule (180 mg total) by mouth daily.   escitalopram  (LEXAPRO ) 10 MG tablet TAKE 1 TABLET BY MOUTH ONCE DAILY WITH  SUPPER  FOR  PANIC  ATTACKS   famotidine  (PEPCID ) 10 MG tablet Take 1 tablet (10 mg total) by mouth daily.   loratadine  (CLARITIN ) 10 MG tablet Take 10 mg by mouth daily.   Multiple Vitamin (MULTIVITAMIN) tablet Take 1 tablet by mouth daily.   mupirocin  ointment (BACTROBAN ) 2 % Apply 1 Application topically 2 (two) times daily. To affected area until healed.   omeprazole  (PRILOSEC) 40 MG capsule TAKE 1 CAPSULE BY MOUTH TWICE DAILY FOR HEARTBURN**MAX REACHED ONLY 30 CAPS ALLOWED   PANTOPRAZOLE  SODIUM PO Take 20 mg by mouth daily. Daily for 20 days   traMADol  (ULTRAM ) 50 MG tablet Take 1 tablet (50 mg total) by mouth daily as needed.   No facility-administered encounter medications on file as of 09/03/2023.    Surgical History: Past Surgical History:  Procedure Laterality Date   BREAST BIOPSY  Right 11/20/2022   us  bx,  marker, path pending   BREAST BIOPSY Right 11/20/2022   US  RT BREAST BX W LOC DEV 1ST LESION IMG BX SPEC US  GUIDE 11/20/2022 [facility]   COLONOSCOPY WITH PROPOFOL  N/A 02/27/2021   Procedure: COLONOSCOPY WITH PROPOFOL ;  Surgeon: Jinny Carmine, MD;  Location: [facility] ENDOSCOPY;  Service: Endoscopy;  Laterality: N/A;   ESOPHAGOGASTRODUODENOSCOPY  02/27/2021   Procedure: ESOPHAGOGASTRODUODENOSCOPY (EGD);  Surgeon: Jinny Carmine, MD;  Location: [facility] ENDOSCOPY;  Service: Endoscopy;;   IVC FILTER INSERTION N/A 10/02/2022   Procedure: IVC FILTER INSERTION;  Surgeon: Marea Selinda RAMAN, MD;  Location: [facility] INVASIVE CV LAB;  Service: Cardiovascular;  Laterality: N/A;   PULMONARY THROMBECTOMY N/A 10/02/2022   Procedure: PULMONARY THROMBECTOMY;  Surgeon: Marea Selinda RAMAN, MD;  Location: [facility] INVASIVE CV LAB;  Service: Cardiovascular;  Laterality: N/A;   RIGHT/LEFT HEART CATH AND CORONARY ANGIOGRAPHY Bilateral 05/26/2019   Procedure: RIGHT/LEFT HEART CATH AND CORONARY ANGIOGRAPHY;  Surgeon: Mady Bruckner, MD;  Location: [facility] INVASIVE CV LAB;  Service: Cardiovascular;  Laterality: Bilateral;   TUBAL LIGATION      Medical History: Past Medical History:  Diagnosis Date   Anemia    Anxiety    Arthritis    Cardiomegaly    Family history of breast cancer    Family history of ovarian cancer    5/22 cancer genetic testing letter sent   Hypertension    Hyperthyroidism  Pulmonary hypertension (HCC)    Sleep apnea    Thyroid  disease    Vertigo     Family History: Family History  Problem Relation Age of Onset   Heart attack Mother    Ovarian cancer Mother    Cancer Father    Cancer Sister    Breast cancer Maternal Grandmother    Breast cancer Cousin        1st maternal cousin   Cancer Other     Social History   Socioeconomic History   Marital status: Single    Spouse name: Not on file   Number of children: Not on file   Years of education: Not on file   Highest  education level: Not on file  Occupational History   Not on file  Tobacco Use   Smoking status: Never    Passive exposure: Never   Smokeless tobacco: Never  Vaping Use   Vaping status: Never Used  Substance and Sexual Activity   Alcohol use: No   Drug use: No   Sexual activity: Not Currently    Birth control/protection: Surgical    Comment: btl  Other Topics Concern   Not on file  Social History Narrative   Not on file   Social Drivers of Health   Financial Resource Strain: Low Risk  (07/09/2023)   Received from [facility]   Overall Financial Resource Strain (CARDIA)    Difficulty of Paying Living Expenses: Not hard at all  Food Insecurity: No Food Insecurity (07/09/2023)   Received from [facility]   Hunger Vital Sign    Within the past 12 months, you worried that your food would run out before you got the money to buy more.: Never true    Within the past 12 months, the food you bought just didn't last and you didn't have money to get more.: Never true  Transportation Needs: No Transportation Needs (07/09/2023)   Received from [facility] - Transportation    In the past 12 months, has lack of transportation kept you from medical appointments or from getting medications?: No    Lack of Transportation (Non-Medical): No  Physical Activity: Not on file  Stress: Not on file  Social Connections: Not on file  Intimate Partner Violence: Patient Unable To Answer (10/01/2022)   Humiliation, Afraid, Rape, and Kick questionnaire    Fear of Current or Ex-Partner: Patient unable to answer    Emotionally Abused: Patient unable to answer    Physically Abused: Patient unable to answer    Sexually Abused: Patient unable to answer      Review of Systems  Constitutional:  Negative for chills, fatigue and unexpected weight change.  HENT: Negative.  Negative for congestion, rhinorrhea, sneezing and sore throat.   Eyes:  Negative  for redness.  Respiratory: Negative.  Negative for cough, chest tightness and shortness of breath.   Cardiovascular: Negative.  Negative for chest pain and palpitations.  Gastrointestinal:  Negative for constipation, diarrhea, nausea and vomiting.       Reflux  Genitourinary:  Negative for dysuria and frequency.  Musculoskeletal:  Positive for arthralgias. Negative for back pain, joint swelling and neck pain.  Skin:  Negative for rash.  Neurological: Negative.  Negative for tremors and numbness.  Hematological:  Negative for adenopathy. Does not bruise/bleed easily.  Psychiatric/Behavioral:  Negative for behavioral problems (Depression) and suicidal ideas. The patient is nervous/anxious.     Vital Signs: BP 132/88  Pulse 86   Temp 98.5 F (36.9 C)   Resp 16   Ht 5' 6 (1.676 m)   Wt 292 lb 12.8 oz (132.8 kg)   LMP  (LMP Unknown)   SpO2 97%   BMI 47.26 kg/m    Physical Exam Vitals and nursing note reviewed.  Constitutional:      General: She is not in acute distress.    Appearance: Normal appearance. She is obese. She is not ill-appearing.  HENT:     Head: Normocephalic and atraumatic.  Eyes:     Pupils: Pupils are equal, round, and reactive to light.  Cardiovascular:     Rate and Rhythm: Normal rate and regular rhythm.  Pulmonary:     Effort: Pulmonary effort is normal. No respiratory distress.  Skin:    General: Skin is warm and dry.  Neurological:     Mental Status: She is oriented to person, place, and time.  Psychiatric:        Mood and Affect: Mood normal.        Behavior: Behavior normal.        Assessment/Plan: 1. Low TSH level (Primary) Will recheck labs - TSH + free T4  2. Thyroid  nodule Will plan for repeat scan in 1 year as recommended on US  report - TSH + free T4  3. Leukocytosis, unspecified type Will recheck labs - CBC w/Diff/Platelet  4. Primary generalized (osteo)arthritis Will follow up with ortho for knee injection as this  typically helps   General Counseling: Shelley [name] verbalizes understanding of the findings of todays visit and agrees with plan of treatment. I have discussed any further diagnostic evaluation that may be needed or ordered today. We also reviewed her medications today. she has been encouraged to call the office with any questions or concerns that should arise related to todays visit.    Orders Placed This Encounter  Procedures   TSH + free T4   CBC w/Diff/Platelet    No orders of the defined types were placed in this encounter.   This patient was seen by Tinnie Pro, PA-C in collaboration with Dr. Sigrid Bathe as a part of collaborative care agreement.   Total time spent:30 Minutes Time spent includes review of chart, medications, test results, and follow up plan with the patient.      Dr Fozia M Khan Internal medicine

## 2023-09-12 DIAGNOSIS — I270 Primary pulmonary hypertension: Secondary | ICD-10-CM | POA: Diagnosis not present

## 2023-09-15 ENCOUNTER — Ambulatory Visit (HOSPITAL_BASED_OUTPATIENT_CLINIC_OR_DEPARTMENT_OTHER): Admitting: Student

## 2023-09-16 ENCOUNTER — Ambulatory Visit (HOSPITAL_BASED_OUTPATIENT_CLINIC_OR_DEPARTMENT_OTHER): Admitting: Student

## 2023-09-20 ENCOUNTER — Telehealth (HOSPITAL_BASED_OUTPATIENT_CLINIC_OR_DEPARTMENT_OTHER): Payer: Self-pay

## 2023-09-20 ENCOUNTER — Ambulatory Visit (HOSPITAL_BASED_OUTPATIENT_CLINIC_OR_DEPARTMENT_OTHER)

## 2023-09-20 ENCOUNTER — Ambulatory Visit (HOSPITAL_BASED_OUTPATIENT_CLINIC_OR_DEPARTMENT_OTHER): Admitting: Student

## 2023-09-20 ENCOUNTER — Encounter (HOSPITAL_BASED_OUTPATIENT_CLINIC_OR_DEPARTMENT_OTHER): Payer: Self-pay | Admitting: Student

## 2023-09-20 DIAGNOSIS — M25561 Pain in right knee: Secondary | ICD-10-CM | POA: Diagnosis not present

## 2023-09-20 DIAGNOSIS — M1711 Unilateral primary osteoarthritis, right knee: Secondary | ICD-10-CM

## 2023-09-20 MED ORDER — TRIAMCINOLONE ACETONIDE 40 MG/ML IJ SUSP
2.0000 mL | INTRAMUSCULAR | Status: AC | PRN
  Administered 2023-09-20: 2 mL via INTRA_ARTICULAR

## 2023-09-20 MED ORDER — LIDOCAINE HCL 1 % IJ SOLN
4.0000 mL | INTRAMUSCULAR | Status: AC | PRN
  Administered 2023-09-20: 4 mL

## 2023-09-20 NOTE — Telephone Encounter (Signed)
 SABRA

## 2023-09-20 NOTE — Telephone Encounter (Signed)
 You can start on the next one that comes up.

## 2023-09-20 NOTE — Telephone Encounter (Signed)
 For the gel injections, you can send it to me as a telephone call in the patients chart.

## 2023-09-20 NOTE — Progress Notes (Signed)
 Chief Complaint: Right knee pain     History of Present Illness:    Shelley [name] is a 63 y.o. female who presents today for evaluation of right knee pain.  She does have known osteoarthritis in both knees and was seen in clinic 2 months ago for a left knee injection which has given her some relief.  Patient reports that last injection performed in the right knee was approximately 1 year ago, and a few days later she ended up in the hospital with a saddle pulmonary embolism.  She was also immobilized in a brace prior to this.  She is currently still on Eliquis  for anticoagulation.  Pain in the right knee is described as moderate to severe and has not been relieved with use of tramadol , Voltaren , ice, or heat.  She has gotten relief prior with meloxicam  although had to discontinue this when placed on Eliquis .   Surgical History:   None  PMH/PSH/Family History/Social History/Meds/Allergies:    Past Medical History:  Diagnosis Date   Anemia    Anxiety    Arthritis    Cardiomegaly    Family history of breast cancer    Family history of ovarian cancer    5/22 cancer genetic testing letter sent   Hypertension    Hyperthyroidism    Pulmonary hypertension (HCC)    Sleep apnea    Thyroid  disease    Vertigo    Past Surgical History:  Procedure Laterality Date   BREAST BIOPSY Right 11/20/2022   us  bx,  marker, path pending   BREAST BIOPSY Right 11/20/2022   US  RT BREAST BX W LOC DEV 1ST LESION IMG BX SPEC US  GUIDE 11/20/2022 [facility]   COLONOSCOPY WITH PROPOFOL  N/A 02/27/2021   Procedure: COLONOSCOPY WITH PROPOFOL ;  Surgeon: Jinny Carmine, MD;  Location: [facility] ENDOSCOPY;  Service: Endoscopy;  Laterality: N/A;   ESOPHAGOGASTRODUODENOSCOPY  02/27/2021   Procedure: ESOPHAGOGASTRODUODENOSCOPY (EGD);  Surgeon: Jinny Carmine, MD;  Location: [facility] ENDOSCOPY;  Service: Endoscopy;;   IVC FILTER INSERTION N/A 10/02/2022   Procedure: IVC FILTER  INSERTION;  Surgeon: Marea Selinda RAMAN, MD;  Location: [facility];  Service: Cardiovascular;  Laterality: N/A;   PULMONARY THROMBECTOMY N/A 10/02/2022   Procedure: PULMONARY THROMBECTOMY;  Surgeon: Marea Selinda RAMAN, MD;  Location: [facility];  Service: Cardiovascular;  Laterality: N/A;   RIGHT/LEFT HEART CATH AND CORONARY ANGIOGRAPHY Bilateral 05/26/2019   Procedure: RIGHT/LEFT HEART CATH AND CORONARY ANGIOGRAPHY;  Surgeon: Mady Bruckner, MD;  Location: [facility];  Service: Cardiovascular;  Laterality: Bilateral;   TUBAL LIGATION     Social History   Socioeconomic History   Marital status: Single    Spouse name: Not on file   Number of children: Not on file   Years of education: Not on file   Highest education level: Not on file  Occupational History   Not on file  Tobacco Use   Smoking status: Never    Passive exposure: Never   Smokeless tobacco: Never  Vaping Use   Vaping status: Never Used  Substance and Sexual Activity   Alcohol use: No   Drug use: No   Sexual activity: Not Currently    Birth control/protection: Surgical    Comment: btl  Other Topics Concern   Not on file  Social History Narrative  Not on file   Social Drivers of Health   Financial Resource Strain: Low Risk  (07/09/2023)   Received from [facility]   Overall Financial Resource Strain (CARDIA)    Difficulty of Paying Living Expenses: Not hard at all  Food Insecurity: No Food Insecurity (07/09/2023)   Received [facility]   Hunger Vital Sign    Within the past 12 months, you worried that your food would run out before you got the money to buy more.: Never true    Within the past 12 months, the food you bought just didn't last and you didn't have money to get more.: Never true  Transportation Needs: No Transportation Needs (07/09/2023)   Received from [facility] - Transportation    In the past 12 months, has lack  of transportation kept you from medical appointments or from getting medications?: No    Lack of Transportation (Non-Medical): No  Physical Activity: Not on file  Stress: Not on file  Social Connections: Not on file   Family History  Problem Relation Age of Onset   Heart attack Mother    Ovarian cancer Mother    Cancer Father    Cancer Sister    Breast cancer Maternal Grandmother    Breast cancer Cousin        1st maternal cousin   Cancer Other    Allergies  Allergen Reactions   Amlodipine     Keflex  [Cephalexin ]     Abdominal pain   Current Outpatient Medications  Medication Sig Dispense Refill   ALPRAZolam  (XANAX ) 0.25 MG tablet Take half tab twice a day as needed for severe panic attacks 30 tablet 0   apixaban  (ELIQUIS ) 5 MG TABS tablet Take 1 tablet (5 mg total) by mouth 2 (two) times daily. 60 tablet 11   diltiazem  (CARDIZEM  CD) 180 MG 24 hr capsule Take 1 capsule (180 mg total) by mouth daily. 90 capsule 1   escitalopram  (LEXAPRO ) 10 MG tablet TAKE 1 TABLET BY MOUTH ONCE DAILY WITH  SUPPER  FOR  PANIC  ATTACKS 90 tablet 2   famotidine  (PEPCID ) 10 MG tablet Take 1 tablet (10 mg total) by mouth daily. 90 tablet 1   loratadine  (CLARITIN ) 10 MG tablet Take 10 mg by mouth daily.     Multiple Vitamin (MULTIVITAMIN) tablet Take 1 tablet by mouth daily.     mupirocin  ointment (BACTROBAN ) 2 % Apply 1 Application topically 2 (two) times daily. To affected area until healed. 30 g 2   omeprazole  (PRILOSEC) 40 MG capsule TAKE 1 CAPSULE BY MOUTH TWICE DAILY FOR HEARTBURN**MAX REACHED ONLY 30 CAPS ALLOWED 30 capsule 5   PANTOPRAZOLE  SODIUM PO Take 20 mg by mouth daily. Daily for 20 days     traMADol  (ULTRAM ) 50 MG tablet Take 1 tablet (50 mg total) by mouth daily as needed. 30 tablet 1   No current facility-administered medications for this visit.   No results found.  Review of Systems:   A ROS was performed including pertinent positives and negatives as documented in the  HPI.  Physical Exam :   Constitutional: NAD and appears stated age Neurological: Alert and oriented Psych: Appropriate affect and cooperative There were no vitals taken for this visit.   Comprehensive Musculoskeletal Exam:    Exam of the right knee demonstrates active range of motion from 0 to 100 degrees with palpable crepitus.  Positive for bilateral joint line tenderness, worse laterally.  Stable  collaterals with varus and valgus stress.  No overlying erythema or warmth.  Mild effusion present.  Imaging:   Xray (right knee 4 views): Advanced tricompartmental osteoarthritis that is bone-on-bone within the medial compartment with chronic appearing fracture lines of the medial femoral condyle and medial tibial plateau.   I personally reviewed and interpreted the radiographs.   Assessment:   63 y.o. female with history of advanced bilateral knee osteoarthritis.  The right knee was evaluated and imaged today which shows bone-on-bone within the medial compartment with subacute appearing fractures of the medial joint line.  Ultimately I believe she will be a candidate for total knee replacement however she would like to hold off on this as long as possible.  Offered a cortisone injection into the right knee today which she is agreeable to.  Injection was performed without any complication.  Also discussed utility of gel injections and we will plan to submit for these today for both knees.  Plan :    - Right knee cortisone injection performed today - Submit for bilateral knee viscosupplementation authorization and return for these injections once symptoms begin to recur     Procedure Note  Patient: Shelley [name]             Date of Birth: March 04, 1961           MRN: [id number]             Visit Date: 09/20/2023  Procedures: Visit Diagnoses:  1. Unilateral primary osteoarthritis, right knee     Large Joint Inj: R knee on 09/20/2023 12:24 PM Indications: pain Details: 22 G 1.5 in  needle, anterolateral approach Medications: 4 mL lidocaine  1 %; 2 mL triamcinolone  acetonide 40 MG/ML Outcome: tolerated well, no immediate complications Procedure, treatment alternatives, risks and benefits explained, specific risks discussed. Consent was given by the patient. Immediately prior to procedure a time out was called to verify the correct patient, procedure, equipment, support staff and site/side marked as required. Patient was prepped and draped in the usual sterile fashion.       I personally saw and evaluated the patient, and participated in the management and treatment plan.  Leonce Reveal, PA-C Orthopedics

## 2023-09-23 ENCOUNTER — Telehealth: Payer: Self-pay

## 2023-09-23 NOTE — Telephone Encounter (Signed)
 VOB submitted for Monovisc, bilateral knee

## 2023-09-23 NOTE — Telephone Encounter (Signed)
-----   Message from [facility] sent at 09/20/2023 10:23 AM EDT ----- Regarding: gel injs Hey. I have a patient that needs to be submitted for gel injections in both knees. [id number] Shelley [name]

## 2023-09-27 DIAGNOSIS — G4733 Obstructive sleep apnea (adult) (pediatric): Secondary | ICD-10-CM | POA: Diagnosis not present

## 2023-10-13 DIAGNOSIS — I270 Primary pulmonary hypertension: Secondary | ICD-10-CM | POA: Diagnosis not present

## 2023-10-15 ENCOUNTER — Telehealth: Payer: Self-pay

## 2023-10-15 NOTE — Telephone Encounter (Signed)
 PA has been faxed to [facility] at [phone] for Monovisc, bilateral knee PA Pending

## 2023-10-22 ENCOUNTER — Telehealth: Payer: Self-pay

## 2023-10-22 ENCOUNTER — Other Ambulatory Visit: Payer: Self-pay | Admitting: Physician Assistant

## 2023-10-25 NOTE — Telephone Encounter (Signed)
 sent

## 2023-10-25 NOTE — Telephone Encounter (Signed)
Pt notified med sent

## 2023-11-03 DIAGNOSIS — D72829 Elevated white blood cell count, unspecified: Secondary | ICD-10-CM | POA: Diagnosis not present

## 2023-11-03 DIAGNOSIS — E041 Nontoxic single thyroid nodule: Secondary | ICD-10-CM | POA: Diagnosis not present

## 2023-11-03 DIAGNOSIS — R7989 Other specified abnormal findings of blood chemistry: Secondary | ICD-10-CM | POA: Diagnosis not present

## 2023-11-04 LAB — CBC WITH DIFFERENTIAL/PLATELET
Basophils Absolute: 0.1 x10E3/uL (ref 0.0–0.2)
Basos: 1 %
EOS (ABSOLUTE): 0.3 x10E3/uL (ref 0.0–0.4)
Eos: 4 %
Hematocrit: 39.9 % (ref 34.0–46.6)
Hemoglobin: 12.8 g/dL (ref 11.1–15.9)
Immature Grans (Abs): 0 x10E3/uL (ref 0.0–0.1)
Immature Granulocytes: 0 %
Lymphocytes Absolute: 3.7 x10E3/uL — ABNORMAL HIGH (ref 0.7–3.1)
Lymphs: 42 %
MCH: 28.7 pg (ref 26.6–33.0)
MCHC: 32.1 g/dL (ref 31.5–35.7)
MCV: 90 fL (ref 79–97)
Monocytes Absolute: 0.8 x10E3/uL (ref 0.1–0.9)
Monocytes: 10 %
Neutrophils Absolute: 3.9 x10E3/uL (ref 1.4–7.0)
Neutrophils: 43 %
Platelets: 425 x10E3/uL (ref 150–450)
RBC: 4.46 x10E6/uL (ref 3.77–5.28)
RDW: 13.9 % (ref 11.7–15.4)
WBC: 8.8 x10E3/uL (ref 3.4–10.8)

## 2023-11-04 LAB — TSH+FREE T4
Free T4: 1.01 ng/dL (ref 0.82–1.77)
TSH: 1.28 u[IU]/mL (ref 0.450–4.500)

## 2023-11-05 ENCOUNTER — Ambulatory Visit (INDEPENDENT_AMBULATORY_CARE_PROVIDER_SITE_OTHER): Admitting: Physician Assistant

## 2023-11-05 VITALS — BP 130/80 | HR 77 | Temp 98.4°F | Resp 16 | Ht 66.0 in | Wt 286.0 lb

## 2023-11-05 DIAGNOSIS — M150 Primary generalized (osteo)arthritis: Secondary | ICD-10-CM | POA: Diagnosis not present

## 2023-11-05 DIAGNOSIS — R0989 Other specified symptoms and signs involving the circulatory and respiratory systems: Secondary | ICD-10-CM

## 2023-11-05 DIAGNOSIS — M25512 Pain in left shoulder: Secondary | ICD-10-CM | POA: Diagnosis not present

## 2023-11-05 DIAGNOSIS — I10 Essential (primary) hypertension: Secondary | ICD-10-CM

## 2023-11-05 NOTE — Progress Notes (Signed)
 [facility] [address] [place], [place] [address]  Internal MEDICINE  Office Visit Note  Patient Name: Shelley [name]  [id number]  [id number]  Date of Service: 11/05/2023  Chief Complaint  Patient presents with   Follow-up   Hypertension    HPI Pt is here for routine follow up, her daughter is with her -Next Friday has appt with vascular thinks she will be stopping Eliquis  as it will have been a year. If so then would go back to mobic  instead of tramadol  for OA. -sat on basket and slid off onto the floor with left arm catching her and shoulder has been sore since. This was about a week ago. Normal ROM, but repetitive use is bothersome and a little tender along front of shoulder. Will start icing and resting it -clearing throat all the time recently, no congestion she is aware of. No difficulty swallowing. Reflux well managed. Will try nasal spray as it may be some postnasal drip/weather changes, If not improving will consider ENT -Wearing cpap at night, no dryness from this -labs reviewed: thyroid  normal, WBC total is down but lymphocytes slightly elevated and will continue to monitor  Current Medication: Outpatient Encounter Medications as of 11/05/2023  Medication Sig   ALPRAZolam  (XANAX ) 0.25 MG tablet Take half tab twice a day as needed for severe panic attacks   apixaban  (ELIQUIS ) 5 MG TABS tablet Take 1 tablet (5 mg total) by mouth 2 (two) times daily.   diltiazem  (CARDIZEM  CD) 180 MG 24 hr capsule Take 1 capsule (180 mg total) by mouth daily.   escitalopram  (LEXAPRO ) 10 MG tablet TAKE 1 TABLET BY MOUTH ONCE DAILY WITH  SUPPER  FOR  PANIC  ATTACKS   famotidine  (PEPCID ) 10 MG tablet Take 1 tablet (10 mg total) by mouth daily.   loratadine  (CLARITIN ) 10 MG tablet Take 10 mg by mouth daily.   Multiple Vitamin (MULTIVITAMIN) tablet Take 1 tablet by mouth daily.   mupirocin  ointment (BACTROBAN ) 2 % Apply 1 Application topically 2 (two) times daily. To affected area  until healed.   omeprazole  (PRILOSEC) 40 MG capsule TAKE 1 CAPSULE BY MOUTH TWICE DAILY FOR HEARTBURN**MAX REACHED ONLY 30 CAPS ALLOWED   PANTOPRAZOLE  SODIUM PO Take 20 mg by mouth daily. Daily for 20 days   traMADol  (ULTRAM ) 50 MG tablet TAKE 1 TABLET BY MOUTH ONCE DAILY AS NEEDED   No facility-administered encounter medications on file as of 11/05/2023.    Surgical History: Past Surgical History:  Procedure Laterality Date   BREAST BIOPSY Right 11/20/2022   us  bx,  marker, path pending   BREAST BIOPSY Right 11/20/2022   US  RT BREAST BX W LOC DEV 1ST LESION IMG BX SPEC US  GUIDE 11/20/2022 [facility]   COLONOSCOPY WITH PROPOFOL  N/A 02/27/2021   Procedure: COLONOSCOPY WITH PROPOFOL ;  Surgeon: Jinny Carmine, MD;  Location: [facility] ENDOSCOPY;  Service: Endoscopy;  Laterality: N/A;   ESOPHAGOGASTRODUODENOSCOPY  02/27/2021   Procedure: ESOPHAGOGASTRODUODENOSCOPY (EGD);  Surgeon: Jinny Carmine, MD;  Location: [facility] ENDOSCOPY;  Service: Endoscopy;;   IVC FILTER INSERTION N/A 10/02/2022   Procedure: IVC FILTER INSERTION;  Surgeon: Marea Selinda RAMAN, MD;  Location: [facility];  Service: Cardiovascular;  Laterality: N/A;   PULMONARY THROMBECTOMY N/A 10/02/2022   Procedure: PULMONARY THROMBECTOMY;  Surgeon: Marea Selinda RAMAN, MD;  Location: [facility];  Service: Cardiovascular;  Laterality: N/A;   RIGHT/LEFT HEART CATH AND CORONARY ANGIOGRAPHY Bilateral 05/26/2019   Procedure: RIGHT/LEFT HEART CATH AND CORONARY ANGIOGRAPHY;  Surgeon:  End, Lonni, MD;  Location: [facility] LAB;  Service: Cardiovascular;  Laterality: Bilateral;   TUBAL LIGATION      Medical History: Past Medical History:  Diagnosis Date   Anemia    Anxiety    Arthritis    Cardiomegaly    Family history of breast cancer    Family history of ovarian cancer    5/22 cancer genetic testing letter sent   Hypertension    Hyperthyroidism    Pulmonary hypertension (HCC)    Sleep apnea    Thyroid  disease     Vertigo     Family History: Family History  Problem Relation Age of Onset   Heart attack Mother    Ovarian cancer Mother    Cancer Father    Cancer Sister    Breast cancer Maternal Grandmother    Breast cancer Cousin        1st maternal cousin   Cancer Other     Social History   Socioeconomic History   Marital status: Single    Spouse name: Not on file   Number of children: Not on file   Years of education: Not on file   Highest education level: Not on file  Occupational History   Not on file  Tobacco Use   Smoking status: Never    Passive exposure: Never   Smokeless tobacco: Never  Vaping Use   Vaping status: Never Used  Substance and Sexual Activity   Alcohol use: No   Drug use: No   Sexual activity: Not Currently    Birth control/protection: Surgical    Comment: btl  Other Topics Concern   Not on file  Social History Narrative   Not on file   Social Drivers of Health   Financial Resource Strain: Low Risk  (07/09/2023)   Received from [facility]   Overall Financial Resource Strain (CARDIA)    Difficulty of Paying Living Expenses: Not hard at all  Food Insecurity: No Food Insecurity (07/09/2023)   Received from [facility]   Hunger Vital Sign    Within the past 12 months, you worried that your food would run out before you got the money to buy more.: Never true    Within the past 12 months, the food you bought just didn't last and you didn't have money to get more.: Never true  Transportation Needs: No Transportation Needs (07/09/2023)   Received from [facility] - Transportation    In the past 12 months, has lack of transportation kept you from medical appointments or from getting medications?: No    Lack of Transportation (Non-Medical): No  Physical Activity: Not on file  Stress: Not on file  Social Connections: Not on file  Intimate Partner Violence: Patient Unable To Answer (10/01/2022)    Humiliation, Afraid, Rape, and Kick questionnaire    Fear of Current or Ex-Partner: Patient unable to answer    Emotionally Abused: Patient unable to answer    Physically Abused: Patient unable to answer    Sexually Abused: Patient unable to answer      Review of Systems  Constitutional:  Negative for chills, fatigue and unexpected weight change.  HENT:  Positive for postnasal drip. Negative for congestion, rhinorrhea, sneezing, sore throat and trouble swallowing.        Throat clearing  Eyes:  Negative for redness.  Respiratory: Negative.  Negative for cough, chest tightness and shortness of breath.   Cardiovascular: Negative.  Negative for chest pain and palpitations.  Gastrointestinal:  Negative for constipation, diarrhea, nausea and vomiting.  Genitourinary:  Negative for dysuria and frequency.  Musculoskeletal:  Positive for arthralgias. Negative for back pain, joint swelling and neck pain.  Skin:  Negative for rash.  Neurological: Negative.  Negative for tremors and numbness.  Hematological:  Negative for adenopathy. Does not bruise/bleed easily.  Psychiatric/Behavioral:  Negative for behavioral problems (Depression) and suicidal ideas. The patient is nervous/anxious.     Vital Signs: BP 130/80   Pulse 77   Temp 98.4 F (36.9 C)   Resp 16   Ht 5' 6 (1.676 m)   Wt 286 lb (129.7 kg)   LMP  (LMP Unknown)   SpO2 99%   BMI 46.16 kg/m    Physical Exam Vitals and nursing note reviewed.  Constitutional:      General: She is not in acute distress.    Appearance: Normal appearance. She is obese. She is not ill-appearing.  HENT:     Head: Normocephalic and atraumatic.     Mouth/Throat:     Pharynx: Posterior oropharyngeal erythema present.  Eyes:     Pupils: Pupils are equal, round, and reactive to light.  Cardiovascular:     Rate and Rhythm: Normal rate and regular rhythm.  Pulmonary:     Effort: Pulmonary effort is normal. No respiratory distress.  Musculoskeletal:         General: Tenderness and signs of injury present.     Comments: Tenderness along anterior left shoulder, +Hawkins, Full ROM  Skin:    General: Skin is warm and dry.  Neurological:     Mental Status: She is oriented to person, place, and time.  Psychiatric:        Mood and Affect: Mood normal.        Behavior: Behavior normal.        Assessment/Plan: 1. Essential hypertension (Primary) Stable, continue current medication  2. Throat clearing May be due to weather changes, will restart nasal spray to see if this helps. If not improving may need ENT  3. Acute pain of left shoulder Advised to ice, rest, and monitor symptoms. May need ortho if not improving  4. Primary generalized (osteo)arthritis May continue tramadol  as needed. May be able to switch back to mobic  in future if decision is made to stop Eliquis  at 1 year mark   General Counseling: Netty verbalizes understanding of the findings of todays visit and agrees with plan of treatment. I have discussed any further diagnostic evaluation that may be needed or ordered today. We also reviewed her medications today. she has been encouraged to call the office with any questions or concerns that should arise related to todays visit.    No orders of the defined types were placed in this encounter.   No orders of the defined types were placed in this encounter.   This patient was seen by Tinnie Pro, PA-C in collaboration with Dr. Sigrid Bathe as a part of collaborative care agreement.   Total time spent:30 Minutes Time spent includes review of chart, medications, test results, and follow up plan with the patient.      Dr Fozia M Khan Internal medicine

## 2023-11-10 ENCOUNTER — Other Ambulatory Visit: Payer: Self-pay | Admitting: Physician Assistant

## 2023-11-10 DIAGNOSIS — K219 Gastro-esophageal reflux disease without esophagitis: Secondary | ICD-10-CM

## 2023-11-11 ENCOUNTER — Other Ambulatory Visit: Payer: Self-pay

## 2023-11-11 DIAGNOSIS — M25512 Pain in left shoulder: Secondary | ICD-10-CM | POA: Diagnosis not present

## 2023-11-11 DIAGNOSIS — S43402A Unspecified sprain of left shoulder joint, initial encounter: Secondary | ICD-10-CM | POA: Diagnosis not present

## 2023-11-11 DIAGNOSIS — X58XXXA Exposure to other specified factors, initial encounter: Secondary | ICD-10-CM | POA: Diagnosis not present

## 2023-11-12 ENCOUNTER — Encounter (INDEPENDENT_AMBULATORY_CARE_PROVIDER_SITE_OTHER): Payer: Self-pay | Admitting: Vascular Surgery

## 2023-11-12 ENCOUNTER — Telehealth: Payer: Self-pay

## 2023-11-12 ENCOUNTER — Ambulatory Visit (INDEPENDENT_AMBULATORY_CARE_PROVIDER_SITE_OTHER): Payer: BLUE CROSS/BLUE SHIELD | Admitting: Vascular Surgery

## 2023-11-12 VITALS — BP 122/80 | HR 85 | Ht 66.0 in | Wt 286.4 lb

## 2023-11-12 DIAGNOSIS — I10 Essential (primary) hypertension: Secondary | ICD-10-CM | POA: Diagnosis not present

## 2023-11-12 DIAGNOSIS — I2699 Other pulmonary embolism without acute cor pulmonale: Secondary | ICD-10-CM

## 2023-11-12 MED ORDER — APIXABAN 2.5 MG PO TABS
2.5000 mg | ORAL_TABLET | Freq: Two times a day (BID) | ORAL | 11 refills | Status: AC
Start: 2023-11-12 — End: ?

## 2023-11-12 NOTE — Assessment & Plan Note (Signed)
 blood pressure control important in reducing the progression of atherosclerotic disease. On appropriate oral medications.

## 2023-11-12 NOTE — Progress Notes (Signed)
 MRN : [id number]  Shelley [name] is a 63 y.o. ([birth year]) female who presents with chief complaint of  Chief Complaint  Patient presents with   Follow-up     - 1 year follow up no studies   .  History of Present Illness: Patient returns today in follow up of her DVT and PE.  She has completed 1 year of anticoagulation with 5 mg twice daily of Eliquis .  She is doing well.  She does not have any chest pain or shortness of breath.  No significant leg symptoms other than mild occasional swelling.  No bleeding issues on anticoagulation.  Current Outpatient Medications  Medication Sig Dispense Refill   ALPRAZolam  (XANAX ) 0.25 MG tablet Take half tab twice a day as needed for severe panic attacks 30 tablet 0   apixaban  (ELIQUIS ) 2.5 MG TABS tablet Take 1 tablet (2.5 mg total) by mouth 2 (two) times daily. 60 tablet 11   diltiazem  (CARDIZEM  CD) 180 MG 24 hr capsule Take 1 capsule (180 mg total) by mouth daily. 90 capsule 1   escitalopram  (LEXAPRO ) 10 MG tablet TAKE 1 TABLET BY MOUTH ONCE DAILY WITH  SUPPER  FOR  PANIC  ATTACKS 90 tablet 2   famotidine  (PEPCID ) 10 MG tablet Take 1 tablet (10 mg total) by mouth daily. 90 tablet 1   loratadine  (CLARITIN ) 10 MG tablet Take 10 mg by mouth daily.     Multiple Vitamin (MULTIVITAMIN) tablet Take 1 tablet by mouth daily.     mupirocin  ointment (BACTROBAN ) 2 % Apply 1 Application topically 2 (two) times daily. To affected area until healed. 30 g 2   omeprazole  (PRILOSEC) 40 MG capsule TAKE 1 CAPSULE BY MOUTH  DAILY FOR HEARTBURN* 90 capsule 1   PANTOPRAZOLE  SODIUM PO Take 20 mg by mouth daily. Daily for 20 days     traMADol  (ULTRAM ) 50 MG tablet TAKE 1 TABLET BY MOUTH ONCE DAILY AS NEEDED 30 tablet 0   No current facility-administered medications for this visit.    Past Medical History:  Diagnosis Date   Anemia    Anxiety    Arthritis    Cardiomegaly    Family history of breast cancer    Family history of ovarian cancer    5/22 cancer  genetic testing letter sent   Hypertension    Hyperthyroidism    Pulmonary hypertension (HCC)    Sleep apnea    Thyroid  disease    Vertigo     Past Surgical History:  Procedure Laterality Date   BREAST BIOPSY Right 11/20/2022   us  bx,  marker, path pending   BREAST BIOPSY Right 11/20/2022   US  RT BREAST BX W LOC DEV 1ST LESION IMG BX SPEC US  GUIDE 11/20/2022 [facility]   COLONOSCOPY WITH PROPOFOL  N/A 02/27/2021   Procedure: COLONOSCOPY WITH PROPOFOL ;  Surgeon: Jinny Carmine, MD;  Location: [facility] ENDOSCOPY;  Service: Endoscopy;  Laterality: N/A;   ESOPHAGOGASTRODUODENOSCOPY  02/27/2021   Procedure: ESOPHAGOGASTRODUODENOSCOPY (EGD);  Surgeon: Jinny Carmine, MD;  Location: [facility] ENDOSCOPY;  Service: Endoscopy;;   IVC FILTER INSERTION N/A 10/02/2022   Procedure: IVC FILTER INSERTION;  Surgeon: Marea Selinda RAMAN, MD;  Location: [facility] CV LAB;  Service: Cardiovascular;  Laterality: N/A;   PULMONARY THROMBECTOMY N/A 10/02/2022   Procedure: PULMONARY THROMBECTOMY;  Surgeon: Marea Selinda RAMAN, MD;  Location: [facility] CV LAB;  Service: Cardiovascular;  Laterality: N/A;   RIGHT/LEFT HEART CATH AND CORONARY ANGIOGRAPHY Bilateral 05/26/2019   Procedure: RIGHT/LEFT HEART CATH AND CORONARY  ANGIOGRAPHY;  Surgeon: Mady Bruckner, MD;  Location: [facility] CV LAB;  Service: Cardiovascular;  Laterality: Bilateral;   TUBAL LIGATION       Social History   Tobacco Use   Smoking status: Never    Passive exposure: Never   Smokeless tobacco: Never  Vaping Use   Vaping status: Never Used  Substance Use Topics   Alcohol use: No   Drug use: No      Family History  Problem Relation Age of Onset   Heart attack Mother    Ovarian cancer Mother    Cancer Father    Cancer Sister    Breast cancer Maternal Grandmother    Breast cancer Cousin        1st maternal cousin   Cancer Other      Allergies  Allergen Reactions   Amlodipine     Keflex  [Cephalexin ]     Abdominal pain        REVIEW OF SYSTEMS (Negative unless checked)   Constitutional: [] Weight loss  [] Fever  [] Chills Cardiac: [] Chest pain   [] Chest pressure   [] Palpitations   [] Shortness of breath when laying flat   [] Shortness of breath at rest   [] Shortness of breath with exertion. Vascular:  [] Pain in legs with walking   [] Pain in legs at rest   [] Pain in legs when laying flat   [] Claudication   [] Pain in feet when walking  [] Pain in feet at rest  [] Pain in feet when laying flat   [x] History of DVT   [x] Phlebitis   [] Swelling in legs   [] Varicose veins   [] Non-healing ulcers Pulmonary:   [] Uses home oxygen    [] Productive cough   [] Hemoptysis   [] Wheeze  [] COPD   [] Asthma Neurologic:  [] Dizziness  [] Blackouts   [] Seizures   [] History of stroke   [] History of TIA  [] Aphasia   [] Temporary blindness   [] Dysphagia   [] Weakness or numbness in arms   [] Weakness or numbness in legs Musculoskeletal:  [x] Arthritis   [] Joint swelling   [] Joint pain   [] Low back pain Hematologic:  [] Easy bruising  [] Easy bleeding   [] Hypercoagulable state   [x] Anemic   Gastrointestinal:  [] Blood in stool   [] Vomiting blood  [] Gastroesophageal reflux/heartburn   [] Abdominal pain Genitourinary:  [] Chronic kidney disease   [] Difficult urination  [] Frequent urination  [] Burning with urination   [] Hematuria Skin:  [] Rashes   [] Ulcers   [] Wounds Psychological:  [x] History of anxiety   []  History of major depression.   Physical Examination  BP 122/80   Pulse 85   Ht 5' 6 (1.676 m)   Wt 286 lb 6.4 oz (129.9 kg)   LMP  (LMP Unknown)   BMI 46.23 kg/m  Gen:  WD/WN, NAD Head: [place]/AT, No temporalis wasting. Ear/Nose/Throat: Hearing grossly intact, nares w/o erythema or drainage Eyes: Conjunctiva clear. Sclera non-icteric Neck: Supple.  Trachea midline Pulmonary:  Good air movement, no use of accessory muscles.  Cardiac: RRR, no JVD Vascular:  Vessel Right Left  Radial Palpable Palpable                          PT Palpable Palpable   DP Palpable Palpable   Gastrointestinal: soft, non-tender/non-distended. No guarding/reflex.  Musculoskeletal: M/S 5/5 throughout.  No deformity or atrophy. Trace LE edema. Neurologic: Sensation grossly intact in extremities.  Symmetrical.  Speech is fluent.  Psychiatric: Judgment intact, Mood & affect appropriate for pt's clinical situation. Dermatologic: No rashes or ulcers  noted.  No cellulitis or open wounds.      Labs Recent Results (from the past 2160 hours)  TSH + free T4     Status: None   Collection Time: 11/03/23  5:13 PM  Result Value Ref Range   TSH 1.280 0.450 - 4.500 uIU/mL   Free T4 1.01 0.82 - 1.77 ng/dL  CBC w/Diff/Platelet     Status: Abnormal   Collection Time: 11/03/23  5:13 PM  Result Value Ref Range   WBC 8.8 3.4 - 10.8 x10E3/uL   RBC 4.46 3.77 - 5.28 x10E6/uL   Hemoglobin 12.8 11.1 - 15.9 g/dL   Hematocrit 60.0 65.9 - 46.6 %   MCV 90 79 - 97 fL   MCH 28.7 26.6 - 33.0 pg   MCHC 32.1 31.5 - 35.7 g/dL   RDW 86.0 88.2 - 84.5 %   Platelets 425 150 - 450 x10E3/uL   Neutrophils 43 Not Estab. %   Lymphs 42 Not Estab. %   Monocytes 10 Not Estab. %   Eos 4 Not Estab. %   Basos 1 Not Estab. %   Neutrophils Absolute 3.9 1.4 - 7.0 x10E3/uL   Lymphocytes Absolute 3.7 (H) 0.7 - 3.1 x10E3/uL   Monocytes Absolute 0.8 0.1 - 0.9 x10E3/uL   EOS (ABSOLUTE) 0.3 0.0 - 0.4 x10E3/uL   Basophils Absolute 0.1 0.0 - 0.2 x10E3/uL   Immature Granulocytes 0 Not Estab. %   Immature Grans (Abs) 0.0 0.0 - 0.1 x10E3/uL    Radiology No results found.  Assessment/Plan  Hypertension blood pressure control important in reducing the progression of atherosclerotic disease. On appropriate oral medications.   Pulmonary embolism (HCC) In discussions with the patient today, we have decided to reduce her dose of Eliquis  to the prophylactic dose of 2.5 mg twice daily and continue this indefinitely due to her severe life-threatening pulmonary embolus last year and the risk of  recurrent PE/DVT in the future.  A prescription will be sent in today.  I will plan to see her annually.    Selinda Gu, MD  11/12/2023 11:17 AM    This note was created with Dragon medical transcription system.  Any errors from dictation are purely unintentional

## 2023-11-12 NOTE — Assessment & Plan Note (Signed)
 In discussions with the patient today, we have decided to reduce her dose of Eliquis  to the prophylactic dose of 2.5 mg twice daily and continue this indefinitely due to her severe life-threatening pulmonary embolus last year and the risk of recurrent PE/DVT in the future.  A prescription will be sent in today.  I will plan to see her annually.

## 2023-11-12 NOTE — Telephone Encounter (Signed)
 As per lauren advised pt that let dr dew send message to lauren that she ok to take meloxicam  due to she still on Eliquis 

## 2023-11-13 DIAGNOSIS — I270 Primary pulmonary hypertension: Secondary | ICD-10-CM | POA: Diagnosis not present

## 2023-11-15 ENCOUNTER — Telehealth (INDEPENDENT_AMBULATORY_CARE_PROVIDER_SITE_OTHER): Payer: Self-pay

## 2023-11-15 ENCOUNTER — Other Ambulatory Visit: Payer: Self-pay

## 2023-11-15 MED ORDER — MELOXICAM 7.5 MG PO TABS: 7.5000 mg | ORAL_TABLET | Freq: Every day | ORAL | 3 refills | Status: DC

## 2023-11-15 NOTE — Telephone Encounter (Signed)
 Pt notified that as lauren sent meloxicam 

## 2023-11-15 NOTE — Telephone Encounter (Signed)
Left a detailed message on the patient voicemail.

## 2023-11-15 NOTE — Telephone Encounter (Signed)
 Patient left a message requesting if possible you could contact her PCP and notify that it is fine that she can restart taking Meloxicam .  Please Advise

## 2023-12-13 DIAGNOSIS — I270 Primary pulmonary hypertension: Secondary | ICD-10-CM | POA: Diagnosis not present

## 2023-12-22 ENCOUNTER — Encounter: Admitting: Internal Medicine

## 2023-12-27 DIAGNOSIS — G4733 Obstructive sleep apnea (adult) (pediatric): Secondary | ICD-10-CM | POA: Diagnosis not present

## 2024-01-10 ENCOUNTER — Encounter: Payer: Self-pay | Admitting: Radiology

## 2024-01-10 ENCOUNTER — Ambulatory Visit: Admitting: Internal Medicine

## 2024-01-13 DIAGNOSIS — I270 Primary pulmonary hypertension: Secondary | ICD-10-CM | POA: Diagnosis not present

## 2024-01-17 ENCOUNTER — Other Ambulatory Visit: Payer: Self-pay

## 2024-01-17 DIAGNOSIS — R0602 Shortness of breath: Secondary | ICD-10-CM

## 2024-01-19 ENCOUNTER — Other Ambulatory Visit: Payer: Self-pay

## 2024-01-19 ENCOUNTER — Ambulatory Visit: Admitting: Internal Medicine

## 2024-01-19 DIAGNOSIS — M1711 Unilateral primary osteoarthritis, right knee: Secondary | ICD-10-CM

## 2024-01-19 DIAGNOSIS — R0602 Shortness of breath: Secondary | ICD-10-CM

## 2024-01-19 DIAGNOSIS — M1712 Unilateral primary osteoarthritis, left knee: Secondary | ICD-10-CM

## 2024-02-04 NOTE — Procedures (Signed)
 [facility] [address] [place], [address]    Complete Pulmonary Function Testing Interpretation:  FINDINGS:  The forced vital capacity is mildly decreased.  FEV1 is normal.  FEV1 FVC ratio is normal.  Postbronchodilator there is no significant change in the FEV1.  Total lung capacity is mildly decreased.  Residual volume is decreased.  FRC is decreased.  DLCO is mildly decreased but normal when corrected for alveolar volume.  IMPRESSION:  This pulmonary function study is most consistent with a mild restrictive lung disease clinical correlation recommended.  Shelley DELENA Bathe, MD [facility] Pulmonary Critical Care Medicine Sleep Medicine

## 2024-02-07 ENCOUNTER — Ambulatory Visit: Admitting: Physician Assistant

## 2024-02-12 DIAGNOSIS — I270 Primary pulmonary hypertension: Secondary | ICD-10-CM | POA: Diagnosis not present

## 2024-02-21 ENCOUNTER — Ambulatory Visit: Admitting: Physician Assistant

## 2024-02-29 ENCOUNTER — Ambulatory Visit: Admitting: Internal Medicine

## 2024-02-29 ENCOUNTER — Encounter: Payer: Self-pay | Admitting: Internal Medicine

## 2024-02-29 VITALS — BP 130/70 | HR 75 | Temp 98.0°F | Resp 16 | Ht 66.0 in | Wt 294.0 lb

## 2024-02-29 DIAGNOSIS — I2699 Other pulmonary embolism without acute cor pulmonale: Secondary | ICD-10-CM

## 2024-02-29 DIAGNOSIS — J984 Other disorders of lung: Secondary | ICD-10-CM

## 2024-02-29 DIAGNOSIS — G4733 Obstructive sleep apnea (adult) (pediatric): Secondary | ICD-10-CM

## 2024-02-29 NOTE — Progress Notes (Signed)
 [facility] [address] [place], [place] [address]  Pulmonary Sleep Medicine   Office Visit Note  Patient Name: Shelley [name] DOB: 1960-06-10 MRN [id number]  Date of Service: 02/29/2024  Complaints/HPI: She had a PFT done shows rstriction. Patient had a CT done in 2024 which was abnormal but she had a PE at the time.  I looked at the CT scan and the CT scan showed that she did have some interstitial reticulonodular changes but no real fibrotic changes at that time.  It has been a year and a half since the scan was done it may be good to get a follow-up CT scan in addition we can rescan to make sure that the pulmonary emboli have totally resolved.  As far as her sleep apnea is concerned she is on CPAP which needs to be continued.  And in addition to that she needs to use the oxygen  as prescribed.  Office Spirometry Results:     ROS  General: (-) fever, (-) chills, (-) night sweats, (-) weakness Skin: (-) rashes, (-) itching,. Eyes: (-) visual changes, (-) redness, (-) itching. Nose and Sinuses: (-) nasal stuffiness or itchiness, (-) postnasal drip, (-) nosebleeds, (-) sinus trouble. Mouth and Throat: (-) sore throat, (-) hoarseness. Neck: (-) swollen glands, (-) enlarged thyroid , (-) neck pain. Respiratory: - cough, (-) bloody sputum, + shortness of breath, - wheezing. Cardiovascular: - ankle swelling, (-) chest pain. Lymphatic: (-) lymph node enlargement. Neurologic: (-) numbness, (-) tingling. Psychiatric: (-) anxiety, (-) depression   Current Medication: Outpatient Encounter Medications as of 02/29/2024  Medication Sig   ALPRAZolam  (XANAX ) 0.25 MG tablet Take half tab twice a day as needed for severe panic attacks   apixaban  (ELIQUIS ) 2.5 MG TABS tablet Take 1 tablet (2.5 mg total) by mouth 2 (two) times daily.   diltiazem  (CARDIZEM  CD) 180 MG 24 hr capsule Take 1 capsule (180 mg total) by mouth daily.   escitalopram  (LEXAPRO ) 10 MG tablet TAKE 1 TABLET BY  MOUTH ONCE DAILY WITH  SUPPER  FOR  PANIC  ATTACKS   famotidine  (PEPCID ) 10 MG tablet Take 1 tablet (10 mg total) by mouth daily.   loratadine  (CLARITIN ) 10 MG tablet Take 10 mg by mouth daily.   meloxicam  (MOBIC ) 7.5 MG tablet Take 1 tablet (7.5 mg total) by mouth daily.   Multiple Vitamin (MULTIVITAMIN) tablet Take 1 tablet by mouth daily.   mupirocin  ointment (BACTROBAN ) 2 % Apply 1 Application topically 2 (two) times daily. To affected area until healed.   omeprazole  (PRILOSEC) 40 MG capsule TAKE 1 CAPSULE BY MOUTH  DAILY FOR HEARTBURN*   PANTOPRAZOLE  SODIUM PO Take 20 mg by mouth daily. Daily for 20 days   traMADol  (ULTRAM ) 50 MG tablet TAKE 1 TABLET BY MOUTH ONCE DAILY AS NEEDED   No facility-administered encounter medications on file as of 02/29/2024.    Surgical History: Past Surgical History:  Procedure Laterality Date   BREAST BIOPSY Right 11/20/2022   us  bx,  marker, path pending   BREAST BIOPSY Right 11/20/2022   US  RT BREAST BX W LOC DEV 1ST LESION IMG BX SPEC US  GUIDE 11/20/2022 [facility]   COLONOSCOPY WITH PROPOFOL  N/A 02/27/2021   Procedure: COLONOSCOPY WITH PROPOFOL ;  Surgeon: Jinny Carmine, MD;  Location: [facility] ENDOSCOPY;  Service: Endoscopy;  Laterality: N/A;   ESOPHAGOGASTRODUODENOSCOPY  02/27/2021   Procedure: ESOPHAGOGASTRODUODENOSCOPY (EGD);  Surgeon: Jinny Carmine, MD;  Location: [facility] ENDOSCOPY;  Service: Endoscopy;;   IVC FILTER INSERTION N/A 10/02/2022  Procedure: IVC FILTER INSERTION;  Surgeon: Marea Selinda RAMAN, MD;  Location: [facility] INVASIVE CV LAB;  Service: Cardiovascular;  Laterality: N/A;   PULMONARY THROMBECTOMY N/A 10/02/2022   Procedure: PULMONARY THROMBECTOMY;  Surgeon: Marea Selinda RAMAN, MD;  Location: [facility] CV LAB;  Service: Cardiovascular;  Laterality: N/A;   RIGHT/LEFT HEART CATH AND CORONARY ANGIOGRAPHY Bilateral 05/26/2019   Procedure: RIGHT/LEFT HEART CATH AND CORONARY ANGIOGRAPHY;  Surgeon: Mady Bruckner, MD;  Location: [facility] CV LAB;   Service: Cardiovascular;  Laterality: Bilateral;   TUBAL LIGATION      Medical History: Past Medical History:  Diagnosis Date   Anemia    Anxiety    Arthritis    Cardiomegaly    Family history of breast cancer    Family history of ovarian cancer    5/22 cancer genetic testing letter sent   Hypertension    Hyperthyroidism    Pulmonary hypertension (HCC)    Sleep apnea    Thyroid  disease    Vertigo     Family History: Family History  Problem Relation Age of Onset   Heart attack Mother    Ovarian cancer Mother    Cancer Father    Cancer Sister    Breast cancer Maternal Grandmother    Breast cancer Cousin        1st maternal cousin   Cancer Other     Social History: Social History   Socioeconomic History   Marital status: Single    Spouse name: Not on file   Number of children: Not on file   Years of education: Not on file   Highest education level: Not on file  Occupational History   Not on file  Tobacco Use   Smoking status: Never    Passive exposure: Never   Smokeless tobacco: Never  Vaping Use   Vaping status: Never Used  Substance and Sexual Activity   Alcohol use: No   Drug use: No   Sexual activity: Not Currently    Birth control/protection: Surgical    Comment: btl  Other Topics Concern   Not on file  Social History Narrative   Not on file   Social Drivers of Health   Tobacco Use: Low Risk (02/29/2024)   Patient History    Smoking Tobacco Use: Never    Smokeless Tobacco Use: Never    Passive Exposure: Never  Financial Resource Strain: Low Risk  (07/09/2023)   Received from [facility]   Overall Financial Resource Strain (CARDIA)    Difficulty of Paying Living Expenses: Not hard at all  Food Insecurity: No Food Insecurity (07/09/2023)   Received from [facility]   Epic    Within the past 12 months, you worried that your food would run out before you got the money to buy more.: Never true    Within the  past 12 months, the food you bought just didn't last and you didn't have money to get more.: Never true  Transportation Needs: No Transportation Needs (07/09/2023)   Received from [facility] - Transportation    In the past 12 months, has lack of transportation kept you from medical appointments or from getting medications?: No    Lack of Transportation (Non-Medical): No  Physical Activity: Not on file  Stress: Not on file  Social Connections: Not on file  Intimate Partner Violence: Patient Unable To Answer (10/01/2022)   Humiliation, Afraid, Rape, and Kick questionnaire    Fear of  Current or Ex-Partner: Patient unable to answer    Emotionally Abused: Patient unable to answer    Physically Abused: Patient unable to answer    Sexually Abused: Patient unable to answer  Depression (PHQ2-9): Low Risk (11/05/2023)   Depression (PHQ2-9)    PHQ-2 Score: 0  Alcohol Screen: Low Risk (07/24/2021)   Alcohol Screen    Last Alcohol Screening Score (AUDIT): 0  Housing: Low Risk  (07/09/2023)   Received from [facility]   Epic    In the last 12 months, was there a time when you were not able to pay the mortgage or rent on time?: No    In the past 12 months, how many times have you moved where you were living?: 0    At any time in the past 12 months, were you homeless or living in a shelter (including now)?: No  Utilities: Not At Risk (07/09/2023)   Received from [facility] Utilities    Threatened with loss of utilities: No  Health Literacy: Not on file    Vital Signs: Blood pressure 130/70, pulse 75, temperature 98 F (36.7 C), resp. rate 16, height 5' 6 (1.676 m), weight 294 lb (133.4 kg), SpO2 98%.  Examination: General Appearance: The patient is well-developed, well-nourished, and in no distress. Skin: Gross inspection of skin unremarkable. Head: normocephalic, no gross deformities. Eyes: no gross deformities noted. ENT:  ears appear grossly normal no exudates. Neck: Supple. No thyromegaly. No LAD. Respiratory: no rhonchi noted. Cardiovascular: Normal S1 and S2 without murmur or rub. Extremities: No cyanosis. pulses are equal. Neurologic: Alert and oriented. No involuntary movements.  LABS: No results found for this or any previous visit (from the past 2160 hours).  Radiology: US  THYROID  Result Date: 08/21/2023 CLINICAL DATA:  thyroid  Nodule, TSH low EXAM: THYROID  ULTRASOUND TECHNIQUE: Ultrasound examination of the thyroid  gland and adjacent soft tissues was performed. COMPARISON:  None Available. FINDINGS: Parenchymal Echotexture: Normal Isthmus: 8 mm Right lobe: 3.6 x 1.4 x 4 cm Left lobe: 4.2 x 1.8 x 1.3 cm _________________________________________________________ Estimated total number of nodules >/= 1 cm: 1 Number of spongiform nodules >/=  2 cm not described below (TR1): 0 Number of mixed cystic and solid nodules >/= 1.5 cm not described below (TR2): 0 _________________________________________________________ Nodule 1 represents a subcentimeter right inferior thyroid  anechoic cystic TR 1 type nodule measuring 6 mm. This nodule does NOT meet TI-RADS criteria for biopsy or dedicated follow-up. Nodule 2 represents a left inferior thyroid  predominately solid isoechoic TR 3 type nodule measuring 1.5 x 1.2 x 1.2 cm *Given size (>/= 1.5 - 2.4 cm) and appearance, a follow-up ultrasound in 1 year should be considered based on TI-RADS criteria. Normal vascularity.  No regional adenopathy. IMPRESSION: 1.5 cm left inferior thyroid  TR 3 type nodule meets criteria for follow-up in 1 year. Electronically Signed   By: CHRISTELLA.  Shick M.D.   On: 08/21/2023 14:14    No results found.  No results found.  Assessment and Plan: Patient Active Problem List   Diagnosis Date Noted   Nocturnal hypoxia 02/14/2023   Acute saddle pulmonary embolism (HCC) 10/01/2022   Anxiety and depression 10/01/2022   Pulmonary embolism (HCC) 10/01/2022    Screen for colon cancer    Polyp of transverse colon    Gastroesophageal reflux disease without esophagitis    OSA on CPAP 09/16/2020   Other hypersomnia 09/16/2020   Encounter for general adult medical examination with abnormal findings 06/19/2019  Encounter for screening mammogram for malignant neoplasm of breast 06/19/2019   Pulmonary hypertension (HCC)    Shortness of breath    Pulmonary hypertension, primary (HCC) 01/04/2019   Acute pain of left shoulder 11/23/2018   Cardiomegaly 11/23/2018   Abnormal ECG 11/23/2018   Acquired hypothyroidism 05/22/2018   Seasonal allergies 05/22/2018   Acute anxiety 05/22/2018   Dysuria 05/22/2018   Hypertension 01/05/2018   Routine cervical smear 01/05/2018   Elevated ferritin 11/26/2015   Thrombocytosis 11/26/2015    1. OSA (obstructive sleep apnea) (Primary) She is on CPAP with supplemental oxygen  this needs to be continued.  I spoke to her about the importance of using the CPAP.  2. Obesity, morbid (HCC) I think this may be the most contributing factor to her restrictive lung disease with the obesity and extra fat carrying on the rib cage.  She needs to work on diet exercise and weight loss.  3. Pulmonary embolism, other, unspecified chronicity, unspecified whether acute cor pulmonale present [facility]) Will get a follow-up CT angio to make sure that PE has resolved and also to relook that the lung parenchyma. - CT Angio Chest W/Cm &/Or Wo Cm; Future  4. Restrictive lung disease As above I do not think she has interstitial disease but rather disease related to restriction of chest wall movement.  General Counseling: I have discussed the findings of the evaluation and examination with Shelley [name].  I have also discussed any further diagnostic evaluation thatmay be needed or ordered today. Shelley [name] verbalizes understanding of the findings of todays visit. We also reviewed her medications today and discussed drug interactions and side effects  including but not limited excessive drowsiness and altered mental states. We also discussed that there is always a risk not just to her but also people around her. she has been encouraged to call the office with any questions or concerns that should arise related to todays visit.  No orders of the defined types were placed in this encounter.    Time spent: 19  I have personally obtained a history, examined the patient, evaluated laboratory and imaging results, formulated the assessment and plan and placed orders.    Shelley DELENA Bathe, MD [facility] Pulmonary and Critical Care Sleep medicine

## 2024-02-29 NOTE — Patient Instructions (Signed)

## 2024-03-03 LAB — PULMONARY FUNCTION TEST

## 2024-03-10 ENCOUNTER — Other Ambulatory Visit (HOSPITAL_BASED_OUTPATIENT_CLINIC_OR_DEPARTMENT_OTHER): Payer: Self-pay | Admitting: Student

## 2024-03-10 ENCOUNTER — Ambulatory Visit (HOSPITAL_BASED_OUTPATIENT_CLINIC_OR_DEPARTMENT_OTHER)

## 2024-03-10 ENCOUNTER — Ambulatory Visit (HOSPITAL_BASED_OUTPATIENT_CLINIC_OR_DEPARTMENT_OTHER): Admitting: Student

## 2024-03-10 ENCOUNTER — Other Ambulatory Visit: Payer: Self-pay | Admitting: Physician Assistant

## 2024-03-10 DIAGNOSIS — M25562 Pain in left knee: Secondary | ICD-10-CM

## 2024-03-10 DIAGNOSIS — M1712 Unilateral primary osteoarthritis, left knee: Secondary | ICD-10-CM

## 2024-03-10 DIAGNOSIS — M1711 Unilateral primary osteoarthritis, right knee: Secondary | ICD-10-CM

## 2024-03-10 MED ORDER — MELOXICAM 7.5 MG PO TABS: 7.5000 mg | ORAL_TABLET | Freq: Every day | ORAL | 0 refills | Status: AC

## 2024-03-10 MED ORDER — HYALURONAN 88 MG/4ML IX SOSY
88.0000 mg | PREFILLED_SYRINGE | INTRA_ARTICULAR | Status: AC | PRN
  Administered 2024-03-10: 88 mg via INTRA_ARTICULAR

## 2024-03-10 MED ORDER — LIDOCAINE HCL 1 % IJ SOLN
4.0000 mL | INTRAMUSCULAR | Status: AC | PRN
  Administered 2024-03-10: 4 mL

## 2024-03-10 NOTE — Progress Notes (Signed)
 "                                Chief Complaint: Left knee pain     History of Present Illness:   03/10/24: Patient presents today for evaluation of worsening left knee pain.  She was last seen on 5/16 and received a cortisone injection which did give her some relief.  Today she reports that earlier this week, she was sitting down for a long period of time and felt a pop within the knee upon standing which has resulted in increased pain and popping sensations.  She is taking meloxicam  7.5 mg daily.   07/23/23: Shelley [name] is a 64 y.o. female presenting to clinic today for evaluation of left knee pain.  Patient reports that this noticeably began about a month and a half ago, however has been worsening over the last 2 weeks.  Denies any recent injury.  Pain is located mainly over the medial aspect of the knee and rates as moderate to severe.  She has been using an Ace wrap as well as tramadol  and Tylenol .  Was recently taking prednisone  which she finished over a week ago however pain immediately returned after completing this.  She is on anticoagulation with Eliquis  and cannot take NSAIDs.  No history of diabetes.   Surgical History:   None  PMH/PSH/Family History/Social History/Meds/Allergies:    Past Medical History:  Diagnosis Date   Anemia    Anxiety    Arthritis    Cardiomegaly    Family history of breast cancer    Family history of ovarian cancer    5/22 cancer genetic testing letter sent   Hypertension    Hyperthyroidism    Pulmonary hypertension (HCC)    Sleep apnea    Thyroid  disease    Vertigo    Past Surgical History:  Procedure Laterality Date   BREAST BIOPSY Right 11/20/2022   us  bx,  marker, path pending   BREAST BIOPSY Right 11/20/2022   US  RT BREAST BX W LOC DEV 1ST LESION IMG BX SPEC US  GUIDE 11/20/2022 [facility]   COLONOSCOPY WITH PROPOFOL  N/A 02/27/2021   Procedure: COLONOSCOPY WITH PROPOFOL ;  Surgeon: Jinny Carmine, MD;  Location: [facility] ENDOSCOPY;   Service: Endoscopy;  Laterality: N/A;   ESOPHAGOGASTRODUODENOSCOPY  02/27/2021   Procedure: ESOPHAGOGASTRODUODENOSCOPY (EGD);  Surgeon: Jinny Carmine, MD;  Location: [facility] ENDOSCOPY;  Service: Endoscopy;;   IVC FILTER INSERTION N/A 10/02/2022   Procedure: IVC FILTER INSERTION;  Surgeon: Marea Selinda RAMAN, MD;  Location: [facility] CV LAB;  Service: Cardiovascular;  Laterality: N/A;   PULMONARY THROMBECTOMY N/A 10/02/2022   Procedure: PULMONARY THROMBECTOMY;  Surgeon: Marea Selinda RAMAN, MD;  Location: [facility] IN[facility]AS[facility] CV LAB;  Service: Cardiovascular;  Laterality: N/A;   RIGHT/LEFT HEART CATH AND CORONARY ANGIOGRAPHY Bilateral 05/26/2019   Procedure: RIGHT/LEFT HEART CATH AND CORONARY ANGIOGRAPHY;  Surgeon: Mady Bruckner, MD;  Location: [facility] CV LAB;  Service: Cardiovascular;  Laterality: Bilateral;   TUBAL LIGATION     Social History   Socioeconomic History   Marital status: Single    Spouse name: Not on file   Number of children: Not on file   Years of education: Not on file   Highest education level: Not on file  Occupational History   Not on file  Tobacco Use   Smoking status: Never    Passive exposure: Never   Smokeless tobacco: Never  Vaping Use  Vaping status: Never Used  Substance and Sexual Activity   Alcohol use: No   Drug use: No   Sexual activity: Not Currently    Birth control/protection: Surgical    Comment: btl  Other Topics Concern   Not on file  Social History Narrative   Not on file   Social Drivers of Health   Tobacco Use: Low Risk (02/29/2024)   Patient History    Smoking Tobacco Use: Never    Smokeless Tobacco Use: Never    Passive Exposure: Never  Financial Resource Strain: Low Risk  (07/09/2023)   Received from [facility]   Overall Financial Resource Strain (CARDIA)    Difficulty of Paying Living Expenses: Not hard at all  Food Insecurity: No Food Insecurity (07/09/2023)   Received from [facility]   Epic     Within the past 12 months, you worried that your food would run out before you got the money to buy more.: Never true    Within the past 12 months, the food you bought just didn't last and you didn't have money to get more.: Never true  Transportation Needs: No Transportation Needs (07/09/2023)   Received from [facility] - Transportation    In the past 12 months, has lack of transportation kept you from medical appointments or from getting medications?: No    Lack of Transportation (Non-Medical): No  Physical Activity: Not on file  Stress: Not on file  Social Connections: Not on file  Depression (PHQ2-9): Low Risk (11/05/2023)   Depression (PHQ2-9)    PHQ-2 Score: 0  Alcohol Screen: Low Risk (07/24/2021)   Alcohol Screen    Last Alcohol Screening Score (AUDIT): 0  Housing: Low Risk  (07/09/2023)   Received from [facility]   Epic    In the last 12 months, was there a time when you were not able to pay the mortgage or rent on time?: No    In the past 12 months, how many times have you moved where you were living?: 0    At any time in the past 12 months, were you homeless or living in a shelter (including now)?: No  Utilities: Not At Risk (07/09/2023)   Received from [facility] Utilities    Threatened with loss of utilities: No  Health Literacy: Not on file   Family History  Problem Relation Age of Onset   Heart attack Mother    Ovarian cancer Mother    Cancer Father    Cancer Sister    Breast cancer Maternal Grandmother    Breast cancer Cousin        1st maternal cousin   Cancer Other    Allergies  Allergen Reactions   Amlodipine     Keflex  [Cephalexin ]     Abdominal pain   Current Outpatient Medications  Medication Sig Dispense Refill   ALPRAZolam  (XANAX ) 0.25 MG tablet Take half tab twice a day as needed for severe panic attacks 30 tablet 0   apixaban  (ELIQUIS ) 2.5 MG TABS tablet Take 1 tablet (2.5 mg  total) by mouth 2 (two) times daily. 60 tablet 11   diltiazem  (CARDIZEM  CD) 180 MG 24 hr capsule Take 1 capsule (180 mg total) by mouth daily. 90 capsule 1   escitalopram  (LEXAPRO ) 10 MG tablet TAKE 1 TABLET BY MOUTH ONCE DAILY WITH  SUPPER  FOR  PANIC  ATTACKS 90 tablet 2  famotidine  (PEPCID ) 10 MG tablet Take 1 tablet (10 mg total) by mouth daily. 90 tablet 1   loratadine  (CLARITIN ) 10 MG tablet Take 10 mg by mouth daily.     meloxicam  (MOBIC ) 7.5 MG tablet Take 1 tablet (7.5 mg total) by mouth daily. 30 tablet 0   Multiple Vitamin (MULTIVITAMIN) tablet Take 1 tablet by mouth daily.     mupirocin  ointment (BACTROBAN ) 2 % Apply 1 Application topically 2 (two) times daily. To affected area until healed. 30 g 2   omeprazole  (PRILOSEC) 40 MG capsule TAKE 1 CAPSULE BY MOUTH  DAILY FOR HEARTBURN* 90 capsule 1   PANTOPRAZOLE  SODIUM PO Take 20 mg by mouth daily. Daily for 20 days     traMADol  (ULTRAM ) 50 MG tablet TAKE 1 TABLET BY MOUTH ONCE DAILY AS NEEDED 30 tablet 0   No current facility-administered medications for this visit.   No results found.  Review of Systems:   A ROS was performed including pertinent positives and negatives as documented in the HPI.  Physical Exam :   Constitutional: NAD and appears stated age Neurological: Alert and oriented Psych: Appropriate affect and cooperative There were no vitals taken for this visit.   Comprehensive Musculoskeletal Exam:    Active range of motion of the left knee is from 10 to 90 degrees with palpable crepitus.  Tenderness over bilateral joint lines, worse laterally.  No instability with varus or valgus stress.  Mild effusion present without erythema or warmth.  Imaging:   Xray (left knee 4 views): Slight progression of tricompartmental osteoarthritis compared to last radiographs which demonstrates bone-on-bone joint space loss in the medial compartment, diffuse osteophytes, and subchondral cyst formation.   I personally reviewed and  interpreted the radiographs.   Assessment:   64 y.o. female with advanced bilateral knee osteoarthritis coming in today with worsening left knee pain.  X-rays today showed no acute abnormalities however there is slight progression in the degenerative changes.  We discussed again today that she is ultimately going to require a total knee arthroplasty but treatment at this time we will focus on symptom management.  Her current BMI is approximately 60, and she understands that this would need to be 40 or under in order to proceed with surgery.  I will place a referral to aquatic therapy today, which I believe she can benefit from in terms of knee motion, strengthening, and overall physical activity.  We do have approval for bilateral Monovisc injections, so patient would like to proceed with this in the left knee today.  Injection was performed from the anterior lateral approach and she tolerated this well.  Patient will schedule a follow-up with her PCP to discuss options for weight loss.  She can plan to follow-up as needed.   Plan :    - Left knee Monovisc injection performed today and can return to clinic as needed - Referral to aquatic therapy - Follow-up with PCP concerning weight loss options     Procedure Note  Patient: Shelley [name]             Date of Birth: [birth year]           MRN: [id number]             Visit Date: 03/10/2024  Procedures: Visit Diagnoses:  1. Unilateral primary osteoarthritis, left knee     Large Joint Inj: L knee on 03/10/2024 11:10 AM Indications: pain Details: 22 G 1.5 in needle, anterolateral approach Medications: 4 mL lidocaine   1 %; 88 mg Hyaluronan 88 MG/4ML Outcome: tolerated well, no immediate complications Procedure, treatment alternatives, risks and benefits explained, specific risks discussed. Consent was given by the patient. Immediately prior to procedure a time out was called to verify the correct patient, procedure, equipment, support staff  and site/side marked as required. Patient was prepped and draped in the usual sterile fashion.      I personally saw and evaluated the patient, and participated in the management and treatment plan.  Leonce Reveal, PA-C Orthopedics "

## 2024-03-12 ENCOUNTER — Other Ambulatory Visit: Payer: Self-pay | Admitting: Physician Assistant

## 2024-03-13 ENCOUNTER — Other Ambulatory Visit: Payer: Self-pay | Admitting: Physician Assistant

## 2024-03-16 ENCOUNTER — Ambulatory Visit (HOSPITAL_BASED_OUTPATIENT_CLINIC_OR_DEPARTMENT_OTHER): Payer: Self-pay | Admitting: Student

## 2024-03-17 ENCOUNTER — Ambulatory Visit (HOSPITAL_BASED_OUTPATIENT_CLINIC_OR_DEPARTMENT_OTHER): Payer: Self-pay | Admitting: Student

## 2024-04-12 ENCOUNTER — Telehealth: Payer: Self-pay | Admitting: Internal Medicine

## 2024-04-12 NOTE — Telephone Encounter (Signed)
 Lvm and sent message to patient for new insurance information-Toni

## 2024-04-13 ENCOUNTER — Encounter: Payer: Self-pay | Admitting: Obstetrics and Gynecology

## 2024-04-14 ENCOUNTER — Other Ambulatory Visit: Payer: Self-pay | Admitting: Nurse Practitioner

## 2024-04-14 ENCOUNTER — Ambulatory Visit (HOSPITAL_BASED_OUTPATIENT_CLINIC_OR_DEPARTMENT_OTHER): Payer: Self-pay | Admitting: Student

## 2024-04-14 ENCOUNTER — Ambulatory Visit (HOSPITAL_BASED_OUTPATIENT_CLINIC_OR_DEPARTMENT_OTHER): Payer: Self-pay

## 2024-04-14 ENCOUNTER — Telehealth: Payer: Self-pay | Admitting: Internal Medicine

## 2024-04-14 DIAGNOSIS — M1711 Unilateral primary osteoarthritis, right knee: Secondary | ICD-10-CM

## 2024-04-14 DIAGNOSIS — G8929 Other chronic pain: Secondary | ICD-10-CM

## 2024-04-14 DIAGNOSIS — M25561 Pain in right knee: Secondary | ICD-10-CM

## 2024-04-14 DIAGNOSIS — M25512 Pain in left shoulder: Secondary | ICD-10-CM

## 2024-04-14 DIAGNOSIS — R5383 Other fatigue: Secondary | ICD-10-CM

## 2024-04-14 MED ORDER — TRIAMCINOLONE ACETONIDE 40 MG/ML IJ SUSP
2.0000 mL | INTRAMUSCULAR | Status: AC | PRN
  Administered 2024-04-14: 2 mL via INTRA_ARTICULAR

## 2024-04-14 MED ORDER — LIDOCAINE HCL 1 % IJ SOLN
4.0000 mL | INTRAMUSCULAR | Status: AC | PRN
  Administered 2024-04-14: 4 mL

## 2024-04-14 MED ORDER — BETAMETHASONE SOD PHOS & ACET 6 (3-3) MG/ML IJ SUSP
2.0000 mL | INTRAMUSCULAR | Status: AC | PRN
  Administered 2024-04-14: 2 mL via INTRA_ARTICULAR

## 2024-04-14 NOTE — Progress Notes (Signed)
 "                                Chief Complaint: Right knee and left shoulder pain     History of Present Illness:    Shelley [name] is a 64 y.o. female presents today for follow-up of her right knee as well as left shoulder pain.  She does have known severe osteoarthritis of the right knee and last received an injection in July 2025.  She had a Monovisc injection in the left knee approximately 1 month ago which has started giving her relief so she feels like she has been compensating with her right knee.  Pain in her left shoulder began last August after using her left arm to catch herself during a fall.  She is continue to have pain in the shoulder although this has been worse over the last 2 weeks.  Pain is mainly located in the anterior shoulder she does not have any numbness or tingling.  Pain worsens with motion particular when reaching for things.  She has tried topical Voltaren , patches, meloxicam , and Tylenol  arthritis.  Surgical History:   None  PMH/PSH/Family History/Social History/Meds/Allergies:    Past Medical History:  Diagnosis Date   Anemia    Anxiety    Arthritis    BRCA negative 06/2023   MyRisk neg except AXIN2 VUS   Cardiomegaly    Family history of breast cancer 06/2023   IBIS=4.5%/riskscore=5.8%   Family history of ovarian cancer 06/2023   MyRisk neg   Hypertension    Hyperthyroidism    Pulmonary hypertension (HCC)    Sleep apnea    Thyroid  disease    Vertigo    Past Surgical History:  Procedure Laterality Date   BREAST BIOPSY Right 11/20/2022   us  bx,  marker, path pending   BREAST BIOPSY Right 11/20/2022   US  RT BREAST BX W LOC DEV 1ST LESION IMG BX SPEC US  GUIDE 11/20/2022 [facility]   COLONOSCOPY WITH PROPOFOL  N/A 02/27/2021   Procedure: COLONOSCOPY WITH PROPOFOL ;  Surgeon: Jinny Carmine, MD;  Location: [facility] ENDOSCOPY;  Service: Endoscopy;  Laterality: N/A;   ESOPHAGOGASTRODUODENOSCOPY  02/27/2021   Procedure: ESOPHAGOGASTRODUODENOSCOPY  (EGD);  Surgeon: Jinny Carmine, MD;  Location: [facility] ENDOSCOPY;  Service: Endoscopy;;   IVC FILTER INSERTION N/A 10/02/2022   Procedure: IVC FILTER INSERTION;  Surgeon: Marea Selinda RAMAN, MD;  Location: [facility];  Service: Cardiovascular;  Laterality: N/A;   PULMONARY THROMBECTOMY N/A 10/02/2022   Procedure: PULMONARY THROMBECTOMY;  Surgeon: Marea Selinda RAMAN, MD;  Location: [facility];  Service: Cardiovascular;  Laterality: N/A;   RIGHT/LEFT HEART CATH AND CORONARY ANGIOGRAPHY Bilateral 05/26/2019   Procedure: RIGHT/LEFT HEART CATH AND CORONARY ANGIOGRAPHY;  Surgeon: Mady Bruckner, MD;  Location: [facility];  Service: Cardiovascular;  Laterality: Bilateral;   TUBAL LIGATION     Social History   Socioeconomic History   Marital status: Single    Spouse name: Not on file   Number of children: Not on file   Years of education: Not on file   Highest education level: Not on file  Occupational History   Not on file  Tobacco Use   Smoking status: Never    Passive exposure: Never   Smokeless tobacco: Never  Vaping Use   Vaping status: Never Used  Substance and Sexual Activity   Alcohol use: No   Drug use: No   Sexual activity:  Not Currently    Birth control/protection: Surgical    Comment: btl  Other Topics Concern   Not on file  Social History Narrative   Not on file   Social Drivers of Health   Tobacco Use: Low Risk (02/29/2024)   Patient History    Smoking Tobacco Use: Never    Smokeless Tobacco Use: Never    Passive Exposure: Never  Financial Resource Strain: Low Risk  (07/09/2023)   Received from [facility]   Overall Financial Resource Strain (CARDIA)    Difficulty of Paying Living Expenses: Not hard at all  Food Insecurity: No Food Insecurity (07/09/2023)   Received from [facility]   Epic    Within the past 12 months, you worried that your food would run out before you got the money to buy more.: Never true     Within the past 12 months, the food you bought just didn't last and you didn't have money to get more.: Never true  Transportation Needs: No Transportation Needs (07/09/2023)   Received from [facility] - Transportation    In the past 12 months, has lack of transportation kept you from medical appointments or from getting medications?: No    Lack of Transportation (Non-Medical): No  Physical Activity: Not on file  Stress: Not on file  Social Connections: Not on file  Depression (PHQ2-9): Low Risk (11/05/2023)   Depression (PHQ2-9)    PHQ-2 Score: 0  Alcohol Screen: Low Risk (07/24/2021)   Alcohol Screen    Last Alcohol Screening Score (AUDIT): 0  Housing: Low Risk  (07/09/2023)   Received [facility]   Epic    In the last 12 months, was there a time when you were not able to pay the mortgage or rent on time?: No    In the past 12 months, how many times have you moved where you were living?: 0    At any time in the past 12 months, were you homeless or living in a shelter (including now)?: No  Utilities: Not At Risk (07/09/2023)   Received from [facility] Utilities    Threatened with loss of utilities: No  Health Literacy: Not on file   Family History  Problem Relation Age of Onset   Heart attack Mother    Ovarian cancer Mother    Cancer Father    Cancer Sister    Breast cancer Maternal Grandmother    Breast cancer Cousin        1st maternal cousin   Cancer Other    Allergies  Allergen Reactions   Amlodipine     Keflex  [Cephalexin ]     Abdominal pain   Current Outpatient Medications  Medication Sig Dispense Refill   ALPRAZolam  (XANAX ) 0.25 MG tablet Take half tab twice a day as needed for severe panic attacks 30 tablet 0   apixaban  (ELIQUIS ) 2.5 MG TABS tablet Take 1 tablet (2.5 mg total) by mouth 2 (two) times daily. 60 tablet 11   diltiazem  (CARDIZEM  CD) 180 MG 24 hr capsule TAKE 1 CAPSULE BY MOUTH  EVERY DAY 30 capsule 5   escitalopram  (LEXAPRO ) 10 MG tablet TAKE 1 TABLET BY MOUTH ONCE DAILY WITH  SUPPER  FOR  PANIC  ATTACKS 90 tablet 2   famotidine  (PEPCID ) 10 MG tablet Take 1 tablet (10 mg total) by mouth daily. 90 tablet 1   loratadine  (CLARITIN ) 10 MG tablet Take  10 mg by mouth daily.     meloxicam  (MOBIC ) 7.5 MG tablet Take 1 tablet (7.5 mg total) by mouth daily. 30 tablet 0   Multiple Vitamin (MULTIVITAMIN) tablet Take 1 tablet by mouth daily.     mupirocin  ointment (BACTROBAN ) 2 % Apply 1 Application topically 2 (two) times daily. To affected area until healed. 30 g 2   omeprazole  (PRILOSEC) 40 MG capsule TAKE 1 CAPSULE BY MOUTH  DAILY FOR HEARTBURN* 90 capsule 1   PANTOPRAZOLE  SODIUM PO Take 20 mg by mouth daily. Daily for 20 days     traMADol  (ULTRAM ) 50 MG tablet TAKE 1 TABLET BY MOUTH ONCE DAILY AS NEEDED 30 tablet 0   No current facility-administered medications for this visit.   No results found.  Review of Systems:   A ROS was performed including pertinent positives and negatives as documented in the HPI.  Physical Exam :   Constitutional: NAD and appears stated age Neurological: Alert and oriented Psych: Appropriate affect and cooperative There were no vitals taken for this visit.   Comprehensive Musculoskeletal Exam:    Right knee range of motion is from 0 to 100 degrees with palpable crepitus.  No instability with varus or valgus stress.  Tenderness over both the medial and lateral joint lines. Tenderness in the left shoulder over the bicipital groove and lateral deltoid.  Equal range of motion bilaterally to 160 degrees flexion, 30 degrees external rotation, and internal rotation to back pockets.  Pain with empty can and speeds.  Negative impingement tests and liftoff.  Imaging:   Xray (right knee 4 views): Severe tricompartmental osteoarthritis that is bone-on-bone in the medial compartment with varus deformity.  No evidence of acute abnormality.   Xray  (left shoulder 3 views): Mild AC joint osteoarthritis and inferior acromion spurring but otherwise negative   I personally reviewed and interpreted the radiographs.   Assessment:   64 y.o. female with chronic right knee pain in the setting of advanced osteoarthritis which is bone-on-bone in the medial compartment.  She has been managing this with cortisone injections and her last was approximately 7 months ago.  Recent Monovisc injection in the left knee has now been getting some good benefit although patient would like to opt for repeat cortisone injection of the left knee.  This was performed today without complication.  She has also been experiencing left shoulder pain since the summer of last year.  Symptoms appear consistent with biceps tendinitis as well as possible rotator cuff tendinopathy.  We discussed treatment options for this and ultimately have decided to proceed with a subacromial injection after no significant fluid accumulation or tendon thickening was seen in the biceps on ultrasound.  Tolerated the injection well and if symptoms continue to persist over the next 1 to 2 weeks, would likely place referral to physical therapy to help address biceps symptoms.  Plan :    - Right knee and left shoulder subacromial injection was performed today - Consider addition of physical therapy if shoulder symptoms persist     Procedure Note  Patient: Shelley [name]             Date of Birth: 1960/12/01           MRN: [id number]             Visit Date: 04/14/2024  Procedures: Visit Diagnoses:  1. Chronic left shoulder pain   2. Unilateral primary osteoarthritis, right knee     Large Joint Inj: R knee  on 04/14/2024 9:46 AM Indications: pain Details: 22 G 1.5 in needle, anterolateral approach Medications: 4 mL lidocaine  1 %; 2 mL triamcinolone  acetonide 40 MG/ML Outcome: tolerated well, no immediate complications Procedure, treatment alternatives, risks and benefits explained,  specific risks discussed. Consent was given by the patient. Immediately prior to procedure a time out was called to verify the correct patient, procedure, equipment, support staff and site/side marked as required. Patient was prepped and draped in the usual sterile fashion.    Large Joint Inj: L subacromial bursa on 04/14/2024 9:46 AM Indications: pain Details: 22 G 1.5 in needle, posterior approach Medications: 4 mL lidocaine  1 %; 2 mL betamethasone  acetate-betamethasone  sodium phosphate 6 (3-3) MG/ML Outcome: tolerated well, no immediate complications Procedure, treatment alternatives, risks and benefits explained, specific risks discussed. Consent was given by the patient. Immediately prior to procedure a time out was called to verify the correct patient, procedure, equipment, support staff and site/side marked as required. Patient was prepped and draped in the usual sterile fashion.      I personally saw and evaluated the patient, and participated in the management and treatment plan.  Leonce Reveal, PA-C Orthopedics "

## 2024-04-14 NOTE — Telephone Encounter (Signed)
 Notified patient of [facility]/S appointment date, arrival time, location. Instructed patient to have lab work done prior to [employer]

## 2024-04-24 ENCOUNTER — Ambulatory Visit: Payer: Self-pay

## 2024-05-29 ENCOUNTER — Ambulatory Visit: Admitting: Internal Medicine

## 2024-08-03 ENCOUNTER — Encounter: Admitting: Physician Assistant

## 2024-11-10 ENCOUNTER — Ambulatory Visit (INDEPENDENT_AMBULATORY_CARE_PROVIDER_SITE_OTHER): Admitting: Vascular Surgery
